# Patient Record
Sex: Male | Born: 1955 | Race: White | Hispanic: No | Marital: Married | Smoking: Never smoker
Health system: Southern US, Community
[De-identification: ages and names within clinical notes are randomized; demographics above are authoritative.]

## PROBLEM LIST (undated history)

## (undated) DIAGNOSIS — I82409 Acute embolism and thrombosis of unspecified deep veins of unspecified lower extremity: Secondary | ICD-10-CM

## (undated) DIAGNOSIS — K5903 Drug induced constipation: Secondary | ICD-10-CM

## (undated) DIAGNOSIS — R202 Paresthesia of skin: Secondary | ICD-10-CM

## (undated) DIAGNOSIS — N400 Enlarged prostate without lower urinary tract symptoms: Secondary | ICD-10-CM

## (undated) DIAGNOSIS — M549 Dorsalgia, unspecified: Secondary | ICD-10-CM

## (undated) DIAGNOSIS — S56429A Laceration of extensor muscle, fascia and tendon of unspecified finger at forearm level, initial encounter: Secondary | ICD-10-CM

## (undated) DIAGNOSIS — R300 Dysuria: Secondary | ICD-10-CM

## (undated) DIAGNOSIS — E039 Hypothyroidism, unspecified: Secondary | ICD-10-CM

## (undated) DIAGNOSIS — S61209A Unspecified open wound of unspecified finger without damage to nail, initial encounter: Secondary | ICD-10-CM

## (undated) DIAGNOSIS — D72829 Elevated white blood cell count, unspecified: Secondary | ICD-10-CM

## (undated) DIAGNOSIS — M4310 Spondylolisthesis, site unspecified: Secondary | ICD-10-CM

## (undated) DIAGNOSIS — N390 Urinary tract infection, site not specified: Secondary | ICD-10-CM

## (undated) DIAGNOSIS — D7589 Other specified diseases of blood and blood-forming organs: Secondary | ICD-10-CM

## (undated) DIAGNOSIS — R200 Anesthesia of skin: Secondary | ICD-10-CM

## (undated) DIAGNOSIS — I10 Essential (primary) hypertension: Secondary | ICD-10-CM

## (undated) DIAGNOSIS — M4300 Spondylolysis, site unspecified: Secondary | ICD-10-CM

## (undated) HISTORY — DX: Spondylolisthesis, site unspecified: M43.10

## (undated) HISTORY — PX: ULNAR NERVE REPAIR: SHX2594

## (undated) HISTORY — PX: HERNIA REPAIR: SHX51

## (undated) HISTORY — DX: Anesthesia of skin: R20.0

## (undated) HISTORY — PX: ESOPHAGOGASTRODUODENOSCOPY: SHX1529

## (undated) HISTORY — DX: Dorsalgia, unspecified: M54.9

## (undated) HISTORY — PX: THROMBECTOMY: SHX45

## (undated) HISTORY — DX: Anesthesia of skin: R20.2

## (undated) HISTORY — PX: UMBILICAL HERNIA REPAIR: SHX196

## (undated) HISTORY — PX: LIPOMA EXCISION: SHX5283

## (undated) HISTORY — DX: Spondylolysis, site unspecified: M43.00

---

## 2011-02-11 ENCOUNTER — Other Ambulatory Visit: Payer: Self-pay | Admitting: Internal Medicine

## 2011-02-12 LAB — PSA

## 2011-02-21 ENCOUNTER — Ambulatory Visit: Payer: Self-pay | Admitting: Internal Medicine

## 2011-03-26 ENCOUNTER — Other Ambulatory Visit: Payer: Self-pay | Admitting: Internal Medicine

## 2012-01-22 ENCOUNTER — Other Ambulatory Visit: Payer: Self-pay | Admitting: Internal Medicine

## 2012-01-22 LAB — T4, FREE: Free Thyroxine: 1.13 ng/dL (ref 0.76–1.46)

## 2012-01-22 LAB — TSH: Thyroid Stimulating Horm: 2.48 u[IU]/mL

## 2012-07-09 ENCOUNTER — Other Ambulatory Visit: Payer: Self-pay | Admitting: Internal Medicine

## 2012-07-09 LAB — CBC WITH DIFFERENTIAL/PLATELET
Basophil #: 0.1 10*3/uL (ref 0.0–0.1)
Basophil %: 1.4 %
Eosinophil #: 0.2 10*3/uL (ref 0.0–0.7)
Eosinophil %: 3 %
HCT: 44.1 % (ref 40.0–52.0)
HGB: 15.5 g/dL (ref 13.0–18.0)
Lymphocyte #: 1.9 10*3/uL (ref 1.0–3.6)
Lymphocyte %: 28.3 %
MCH: 34.1 pg — ABNORMAL HIGH (ref 26.0–34.0)
MCHC: 35.1 g/dL (ref 32.0–36.0)
MCV: 97 fL (ref 80–100)
Monocyte #: 0.4 x10 3/mm (ref 0.2–1.0)
Monocyte %: 6.2 %
Neutrophil #: 4.1 10*3/uL (ref 1.4–6.5)
Neutrophil %: 61.1 %
Platelet: 241 10*3/uL (ref 150–440)
RBC: 4.53 10*6/uL (ref 4.40–5.90)
RDW: 12.6 % (ref 11.5–14.5)
WBC: 6.8 10*3/uL (ref 3.8–10.6)

## 2012-07-09 LAB — URINALYSIS, COMPLETE
Bacteria: NONE SEEN
Bilirubin,UR: NEGATIVE
Blood: NEGATIVE
Glucose,UR: NEGATIVE mg/dL (ref 0–75)
Ketone: NEGATIVE
Leukocyte Esterase: NEGATIVE
Nitrite: NEGATIVE
Ph: 6 (ref 4.5–8.0)
Protein: NEGATIVE
RBC,UR: <1 /HPF (ref 0–5)
Specific Gravity: 1.015 (ref 1.003–1.030)
Squamous Epithelial: <1
WBC UR: 1 /HPF (ref 0–5)

## 2012-07-09 LAB — AMYLASE: Amylase: 27 U/L (ref 25–115)

## 2012-07-09 LAB — COMPREHENSIVE METABOLIC PANEL
Albumin: 4.1 g/dL (ref 3.4–5.0)
Alkaline Phosphatase: 77 U/L (ref 50–136)
Anion Gap: 9 (ref 7–16)
BUN: 15 mg/dL (ref 7–18)
Bilirubin,Total: 0.4 mg/dL (ref 0.2–1.0)
Calcium, Total: 8.7 mg/dL (ref 8.5–10.1)
Chloride: 108 mmol/L — ABNORMAL HIGH (ref 98–107)
Co2: 25 mmol/L (ref 21–32)
Creatinine: 0.92 mg/dL (ref 0.60–1.30)
EGFR (African American): >60
EGFR (Non-African Amer.): >60
Glucose: 94 mg/dL (ref 65–99)
Osmolality: 284 (ref 275–301)
Potassium: 3.7 mmol/L (ref 3.5–5.1)
SGOT(AST): 19 U/L (ref 15–37)
SGPT (ALT): 22 U/L (ref 12–78)
Sodium: 142 mmol/L (ref 136–145)
Total Protein: 7.4 g/dL (ref 6.4–8.2)

## 2012-07-09 LAB — LIPASE, BLOOD: Lipase: 164 U/L (ref 73–393)

## 2012-07-09 LAB — TROPONIN I: Troponin-I: <0.02 ng/mL

## 2014-10-06 DIAGNOSIS — E034 Atrophy of thyroid (acquired): Secondary | ICD-10-CM | POA: Insufficient documentation

## 2014-10-06 DIAGNOSIS — I10 Essential (primary) hypertension: Secondary | ICD-10-CM | POA: Insufficient documentation

## 2016-01-11 DIAGNOSIS — I10 Essential (primary) hypertension: Secondary | ICD-10-CM | POA: Diagnosis not present

## 2016-01-11 DIAGNOSIS — E034 Atrophy of thyroid (acquired): Secondary | ICD-10-CM | POA: Diagnosis not present

## 2016-01-11 DIAGNOSIS — D7589 Other specified diseases of blood and blood-forming organs: Secondary | ICD-10-CM | POA: Diagnosis not present

## 2016-01-11 DIAGNOSIS — Z0000 Encounter for general adult medical examination without abnormal findings: Secondary | ICD-10-CM | POA: Diagnosis not present

## 2016-01-11 DIAGNOSIS — Z125 Encounter for screening for malignant neoplasm of prostate: Secondary | ICD-10-CM | POA: Diagnosis not present

## 2016-04-12 DIAGNOSIS — Z125 Encounter for screening for malignant neoplasm of prostate: Secondary | ICD-10-CM | POA: Diagnosis not present

## 2016-04-12 DIAGNOSIS — D7589 Other specified diseases of blood and blood-forming organs: Secondary | ICD-10-CM | POA: Diagnosis not present

## 2016-04-12 DIAGNOSIS — I10 Essential (primary) hypertension: Secondary | ICD-10-CM | POA: Diagnosis not present

## 2016-04-18 DIAGNOSIS — K146 Glossodynia: Secondary | ICD-10-CM

## 2016-04-18 DIAGNOSIS — I10 Essential (primary) hypertension: Secondary | ICD-10-CM | POA: Diagnosis not present

## 2016-04-18 DIAGNOSIS — E034 Atrophy of thyroid (acquired): Secondary | ICD-10-CM | POA: Diagnosis not present

## 2016-04-18 HISTORY — DX: Glossodynia: K14.6

## 2016-09-06 DIAGNOSIS — H5202 Hypermetropia, left eye: Secondary | ICD-10-CM | POA: Diagnosis not present

## 2016-11-19 ENCOUNTER — Other Ambulatory Visit: Payer: Self-pay | Admitting: Surgery

## 2016-11-19 DIAGNOSIS — R6889 Other general symptoms and signs: Secondary | ICD-10-CM

## 2016-11-19 MED ORDER — OSELTAMIVIR PHOSPHATE 75 MG PO CAPS
75.0000 mg | ORAL_CAPSULE | Freq: Every day | ORAL | 0 refills | Status: DC
Start: 2016-11-19 — End: 2018-12-07

## 2017-04-30 DIAGNOSIS — Z125 Encounter for screening for malignant neoplasm of prostate: Secondary | ICD-10-CM | POA: Diagnosis not present

## 2017-04-30 DIAGNOSIS — I10 Essential (primary) hypertension: Secondary | ICD-10-CM | POA: Diagnosis not present

## 2017-04-30 DIAGNOSIS — Z7982 Long term (current) use of aspirin: Secondary | ICD-10-CM | POA: Diagnosis not present

## 2017-04-30 DIAGNOSIS — E034 Atrophy of thyroid (acquired): Secondary | ICD-10-CM | POA: Diagnosis not present

## 2017-05-09 DIAGNOSIS — I10 Essential (primary) hypertension: Secondary | ICD-10-CM | POA: Diagnosis not present

## 2017-05-09 DIAGNOSIS — N401 Enlarged prostate with lower urinary tract symptoms: Secondary | ICD-10-CM | POA: Insufficient documentation

## 2017-05-09 DIAGNOSIS — E034 Atrophy of thyroid (acquired): Secondary | ICD-10-CM | POA: Diagnosis not present

## 2017-05-09 DIAGNOSIS — Z0000 Encounter for general adult medical examination without abnormal findings: Secondary | ICD-10-CM | POA: Diagnosis not present

## 2017-05-09 DIAGNOSIS — R3911 Hesitancy of micturition: Secondary | ICD-10-CM | POA: Insufficient documentation

## 2017-05-09 DIAGNOSIS — K146 Glossodynia: Secondary | ICD-10-CM | POA: Diagnosis not present

## 2018-06-29 DIAGNOSIS — I10 Essential (primary) hypertension: Secondary | ICD-10-CM | POA: Diagnosis not present

## 2018-06-29 DIAGNOSIS — E034 Atrophy of thyroid (acquired): Secondary | ICD-10-CM | POA: Diagnosis not present

## 2018-06-29 DIAGNOSIS — K146 Glossodynia: Secondary | ICD-10-CM | POA: Diagnosis not present

## 2018-06-29 DIAGNOSIS — Z0000 Encounter for general adult medical examination without abnormal findings: Secondary | ICD-10-CM | POA: Diagnosis not present

## 2018-06-29 DIAGNOSIS — Z125 Encounter for screening for malignant neoplasm of prostate: Secondary | ICD-10-CM | POA: Diagnosis not present

## 2018-07-16 DIAGNOSIS — N401 Enlarged prostate with lower urinary tract symptoms: Secondary | ICD-10-CM | POA: Diagnosis not present

## 2018-07-16 DIAGNOSIS — E034 Atrophy of thyroid (acquired): Secondary | ICD-10-CM | POA: Diagnosis not present

## 2018-07-16 DIAGNOSIS — I10 Essential (primary) hypertension: Secondary | ICD-10-CM | POA: Diagnosis not present

## 2018-07-16 DIAGNOSIS — Z0000 Encounter for general adult medical examination without abnormal findings: Secondary | ICD-10-CM | POA: Diagnosis not present

## 2018-09-04 ENCOUNTER — Telehealth: Payer: Self-pay

## 2018-09-04 NOTE — Telephone Encounter (Signed)
Received a call from Dr. Allen Norris regarding patients colonoscopy referral from Sept.  Referral has not been received in our workque.  I was unable to contact patient to schedule him for his colonoscopy as Dr. Allen Norris requested.  No voicemail or answer service answered.  Thanks [employer]

## 2018-09-07 NOTE — Telephone Encounter (Signed)
Will you please try and contact him today again? Thanks!

## 2018-11-05 ENCOUNTER — Telehealth: Payer: Self-pay

## 2018-11-05 ENCOUNTER — Other Ambulatory Visit: Payer: Self-pay

## 2018-11-05 DIAGNOSIS — Z1211 Encounter for screening for malignant neoplasm of colon: Secondary | ICD-10-CM

## 2018-11-05 NOTE — Telephone Encounter (Signed)
Patient has been scheduled for screening colonoscopy a [place] with Dr. Allen Norris on 12/04/18.  Thanks [employer]

## 2018-11-18 ENCOUNTER — Telehealth: Payer: Self-pay

## 2018-11-18 ENCOUNTER — Telehealth: Payer: Self-pay | Admitting: Gastroenterology

## 2018-11-18 NOTE — Telephone Encounter (Signed)
Returned patients call.  He did not have his schedule and will call me back tomorrow.  Thanks [employer]

## 2018-11-18 NOTE — Telephone Encounter (Signed)
Contacted patient in regards to colonoscopy that was scheduled for 12/04/18 with Dr. Allen Norris at [facility].  Dr. Allen Norris will not be available due to a conference.  Patient states that he will call me back to reschedule.  Please get me to the phone when he calls back.  Thanks [employer]

## 2018-11-18 NOTE — Telephone Encounter (Signed)
Please call patient & schedule colonoscopy with Dr Allen Norris.

## 2018-11-20 ENCOUNTER — Other Ambulatory Visit: Payer: Self-pay

## 2018-11-20 DIAGNOSIS — Z1211 Encounter for screening for malignant neoplasm of colon: Secondary | ICD-10-CM

## 2018-12-04 ENCOUNTER — Ambulatory Visit: Admit: 2018-12-04 | Payer: 59 | Admitting: Gastroenterology

## 2018-12-04 SURGERY — COLONOSCOPY WITH PROPOFOL
Anesthesia: Choice

## 2018-12-07 ENCOUNTER — Other Ambulatory Visit: Payer: Self-pay

## 2018-12-07 ENCOUNTER — Encounter: Payer: Self-pay | Admitting: *Deleted

## 2018-12-15 NOTE — Discharge Instructions (Signed)
General Anesthesia, Adult, Care After  This sheet gives you information about how to care for yourself after your procedure. Your health care provider may also give you more specific instructions. If you have problems or questions, contact your health care provider.  What can I expect after the procedure?  After the procedure, the following side effects are common:  Pain or discomfort at the IV site.  Nausea.  Vomiting.  Sore throat.  Trouble concentrating.  Feeling cold or chills.  Weak or tired.  Sleepiness and fatigue.  Soreness and body aches. These side effects can affect parts of the body that were not involved in surgery.  Follow these instructions at home:    For at least 24 hours after the procedure:  Have a responsible adult stay with you. It is important to have someone help care for you until you are awake and alert.  Rest as needed.  Do not:  Participate in activities in which you could fall or become injured.  Drive.  Use heavy machinery.  Drink alcohol.  Take sleeping pills or medicines that cause drowsiness.  Make important decisions or sign legal documents.  Take care of children on your own.  Eating and drinking  Follow any instructions from your health care provider about eating or drinking restrictions.  When you feel hungry, start by eating small amounts of foods that are soft and easy to digest (bland), such as toast. Gradually return to your regular diet.  Drink enough fluid to keep your urine pale yellow.  If you vomit, rehydrate by drinking water, juice, or clear broth.  General instructions  If you have sleep apnea, surgery and certain medicines can increase your risk for breathing problems. Follow instructions from your health care provider about wearing your sleep device:  Anytime you are sleeping, including during daytime naps.  While taking prescription pain medicines, sleeping medicines, or medicines that make you drowsy.  Return to your normal activities as told by your health care  provider. Ask your health care provider what activities are safe for you.  Take over-the-counter and prescription medicines only as told by your health care provider.  If you smoke, do not smoke without supervision.  Keep all follow-up visits as told by your health care provider. This is important.  Contact a health care provider if:  You have nausea or vomiting that does not get better with medicine.  You cannot eat or drink without vomiting.  You have pain that does not get better with medicine.  You are unable to pass urine.  You develop a skin rash.  You have a fever.  You have redness around your IV site that gets worse.  Get help right away if:  You have difficulty breathing.  You have chest pain.  You have blood in your urine or stool, or you vomit blood.  Summary  After the procedure, it is common to have a sore throat or nausea. It is also common to feel tired.  Have a responsible adult stay with you for the first 24 hours after general anesthesia. It is important to have someone help care for you until you are awake and alert.  When you feel hungry, start by eating small amounts of foods that are soft and easy to digest (bland), such as toast. Gradually return to your regular diet.  Drink enough fluid to keep your urine pale yellow.  Return to your normal activities as told by your health care provider. Ask your health care   provider what activities are safe for you.  This information is not intended to replace advice given to you by your health care provider. Make sure you discuss any questions you have with your health care provider.  Document Released: 01/13/2001 Document Revised: 05/23/2017 Document Reviewed: 05/23/2017  Elsevier Interactive Patient Education  2019 [employer]

## 2018-12-18 ENCOUNTER — Ambulatory Visit: Payer: 59 | Admitting: Anesthesiology

## 2018-12-18 ENCOUNTER — Encounter
Admission: RE | Disposition: A | Discharge status: Home / Self Care | Payer: Self-pay | Source: Home / Self Care | Attending: Gastroenterology

## 2018-12-18 ENCOUNTER — Encounter: Payer: Self-pay | Admitting: Gastroenterology

## 2018-12-18 ENCOUNTER — Ambulatory Visit
Admission: RE | Admit: 2018-12-18 | Discharge: 2018-12-18 | Disposition: A | Discharge status: Home / Self Care | Payer: 59 | Attending: Gastroenterology | Admitting: Gastroenterology

## 2018-12-18 DIAGNOSIS — K5730 Diverticulosis of large intestine without perforation or abscess without bleeding: Secondary | ICD-10-CM | POA: Diagnosis not present

## 2018-12-18 DIAGNOSIS — Z1211 Encounter for screening for malignant neoplasm of colon: Secondary | ICD-10-CM | POA: Diagnosis not present

## 2018-12-18 DIAGNOSIS — K635 Polyp of colon: Secondary | ICD-10-CM | POA: Diagnosis not present

## 2018-12-18 DIAGNOSIS — I10 Essential (primary) hypertension: Secondary | ICD-10-CM | POA: Diagnosis not present

## 2018-12-18 DIAGNOSIS — E039 Hypothyroidism, unspecified: Secondary | ICD-10-CM | POA: Diagnosis not present

## 2018-12-18 DIAGNOSIS — Z79890 Hormone replacement therapy (postmenopausal): Secondary | ICD-10-CM | POA: Insufficient documentation

## 2018-12-18 DIAGNOSIS — Z79899 Other long term (current) drug therapy: Secondary | ICD-10-CM | POA: Diagnosis not present

## 2018-12-18 DIAGNOSIS — K648 Other hemorrhoids: Secondary | ICD-10-CM | POA: Insufficient documentation

## 2018-12-18 DIAGNOSIS — D122 Benign neoplasm of ascending colon: Secondary | ICD-10-CM | POA: Diagnosis not present

## 2018-12-18 HISTORY — PX: COLONOSCOPY WITH PROPOFOL: SHX5780

## 2018-12-18 HISTORY — DX: Benign prostatic hyperplasia without lower urinary tract symptoms: N40.0

## 2018-12-18 HISTORY — DX: Essential (primary) hypertension: I10

## 2018-12-18 HISTORY — DX: Hypothyroidism, unspecified: E03.9

## 2018-12-18 HISTORY — PX: POLYPECTOMY: SHX5525

## 2018-12-18 HISTORY — DX: Other specified diseases of blood and blood-forming organs: D75.89

## 2018-12-18 SURGERY — COLONOSCOPY WITH PROPOFOL
Anesthesia: General | Site: Rectum

## 2018-12-18 MED ORDER — FENTANYL CITRATE (PF) 100 MCG/2ML IJ SOLN: 25.0000 ug | INTRAMUSCULAR | Status: DC | PRN

## 2018-12-18 MED ORDER — OXYCODONE HCL 5 MG PO TABS: 5.0000 mg | ORAL_TABLET | Freq: Once | ORAL | Status: DC | PRN

## 2018-12-18 MED ORDER — STERILE WATER FOR IRRIGATION IR SOLN
Status: DC | PRN
  Administered 2018-12-18: 08:00:00

## 2018-12-18 MED ORDER — LACTATED RINGERS IV SOLN
INTRAVENOUS | Status: DC
  Administered 2018-12-18: 07:00:00 via INTRAVENOUS

## 2018-12-18 MED ORDER — PROPOFOL 10 MG/ML IV BOLUS
INTRAVENOUS | Status: DC | PRN
  Administered 2018-12-18 (×3): 50 mg via INTRAVENOUS
  Administered 2018-12-18: 20 mg via INTRAVENOUS
  Administered 2018-12-18: 50 mg via INTRAVENOUS
  Administered 2018-12-18: 20 mg via INTRAVENOUS
  Administered 2018-12-18: 50 mg via INTRAVENOUS
  Administered 2018-12-18: 10 mg via INTRAVENOUS
  Administered 2018-12-18: 100 mg via INTRAVENOUS
  Administered 2018-12-18: 50 mg via INTRAVENOUS
  Administered 2018-12-18: 20 mg via INTRAVENOUS

## 2018-12-18 MED ORDER — SODIUM CHLORIDE 0.9 % IV SOLN: INTRAVENOUS | Status: DC

## 2018-12-18 MED ORDER — LIDOCAINE HCL (CARDIAC) PF 100 MG/5ML IV SOSY
PREFILLED_SYRINGE | INTRAVENOUS | Status: DC | PRN
  Administered 2018-12-18: 440 mg via INTRAVENOUS

## 2018-12-18 MED ORDER — PROMETHAZINE HCL 25 MG/ML IJ SOLN: 6.2500 mg | INTRAMUSCULAR | Status: DC | PRN

## 2018-12-18 MED ORDER — OXYCODONE HCL 5 MG/5ML PO SOLN: 5.0000 mg | Freq: Once | ORAL | Status: DC | PRN

## 2018-12-18 SURGICAL SUPPLY — 6 items
CANISTER SUCT 1200ML W/VALVE (MISCELLANEOUS) ×2 IMPLANT
FORCEPS BIOP RAD 4 LRG CAP 4 (CUTTING FORCEPS) ×2 IMPLANT
GOWN CVR UNV OPN BCK APRN NK (MISCELLANEOUS) ×2 IMPLANT
GOWN ISOL THUMB LOOP REG UNIV (MISCELLANEOUS) ×2
KIT ENDO PROCEDURE OLY (KITS) ×2 IMPLANT
WATER STERILE IRR 250ML POUR (IV SOLUTION) ×2 IMPLANT

## 2018-12-18 NOTE — H&P (Signed)
Alexander Lame, MD [address] [place], [place] [address] Phone: [phone] Fax : [phone]  Primary Care Physician:  Adin Hector, MD Primary Gastroenterologist:  Dr. Allen Norris  Pre-Procedure History & Physical: HPI:  Alexander Rojas is a 63 y.o. male is here for a screening colonoscopy.   Past Medical History:  Diagnosis Date  . BPH (benign prostatic hyperplasia)   . Hypertension   . Hypothyroidism   . Macrocytosis     Past Surgical History:  Procedure Laterality Date  . LIPOMA EXCISION Right    upper arm  . UMBILICAL HERNIA REPAIR      Prior to Admission medications   Medication Sig Start Date End Date Taking? Authorizing Provider  hydrochlorothiazide (HYDRODIURIL) 12.5 MG tablet Take 12.5 mg by mouth daily.   Yes [provider]  levothyroxine (SYNTHROID, LEVOTHROID) 100 MCG tablet Take 100 mcg by mouth daily before breakfast.   Yes [provider]  losartan (COZAAR) 50 MG tablet Take 50 mg by mouth daily.   Yes [provider]    Allergies as of 11/20/2018  . (Not on File)    History reviewed. No pertinent family history.  Social History   Socioeconomic History  . Marital status: Married    Spouse name: Not on file  . Number of children: Not on file  . Years of education: Not on file  . Highest education level: Not on file  Occupational History  . Not on file  Social Needs  . Financial resource strain: Not on file  . Food insecurity:    Worry: Not on file    Inability: Not on file  . Transportation needs:    Medical: Not on file    Non-medical: Not on file  Tobacco Use  . Smoking status: Never Smoker  . Smokeless tobacco: Never Used  Substance and Sexual Activity  . Alcohol use: Not Currently  . Drug use: Not on file  . Sexual activity: Not on file  Lifestyle  . Physical activity:    Days per week: Not on file    Minutes per session: Not on file  . Stress: Not on file  Relationships  . Social  connections:    Talks on phone: Not on file    Gets together: Not on file    Attends religious service: Not on file    Active member of club or organization: Not on file    Attends meetings of clubs or organizations: Not on file    Relationship status: Not on file  . Intimate partner violence:    Fear of current or ex partner: Not on file    Emotionally abused: Not on file    Physically abused: Not on file    Forced sexual activity: Not on file  Other Topics Concern  . Not on file  Social History Narrative  . Not on file    Review of Systems: See HPI, otherwise negative ROS  Physical Exam: BP 124/80   Pulse (!) 58   Temp (!) 97.3 F (36.3 C) (Temporal)   Ht 5\' 9"  (1.753 m)   Wt 85.7 kg   SpO2 96%   BMI 27.91 kg/m  General:   Alert,  pleasant and cooperative in NAD Head:  Normocephalic and atraumatic. Neck:  Supple; no masses or thyromegaly. Lungs:  Clear throughout to auscultation.    Heart:  Regular rate and rhythm. Abdomen:  Soft, nontender and nondistended. Normal bowel sounds, without guarding, and without  rebound.   Neurologic:  Alert and  oriented x4;  grossly normal neurologically.  Impression/Plan: Alexander Rojas is now here to undergo a screening colonoscopy.  Risks, benefits, and alternatives regarding colonoscopy have been reviewed with the patient.  Questions have been answered.  All parties agreeable.

## 2018-12-18 NOTE — Anesthesia Preprocedure Evaluation (Signed)
Anesthesia Evaluation  Patient identified by MRN, date of birth, ID band Patient awake    Reviewed: Allergy & Precautions, NPO status , Patient's Chart, lab work & pertinent test results  Airway Mallampati: II  TM Distance: >3 FB Neck ROM: Full    Dental no notable dental hx.    Pulmonary neg pulmonary ROS,    Pulmonary exam normal breath sounds clear to auscultation       Cardiovascular hypertension, Normal cardiovascular exam Rhythm:Regular Rate:Normal     Neuro/Psych negative neurological ROS  negative psych ROS   GI/Hepatic negative GI ROS, Neg liver ROS,   Endo/Other  Hypothyroidism   Renal/GU negative Renal ROS  negative genitourinary   Musculoskeletal negative musculoskeletal ROS (+)   Abdominal   Peds negative pediatric ROS (+)  Hematology negative hematology ROS (+)   Anesthesia Other Findings   Reproductive/Obstetrics negative OB ROS                             Anesthesia Physical Anesthesia Plan  ASA: II  Anesthesia Plan: General   Post-op Pain Management:    Induction: Intravenous  PONV Risk Score and Plan:   Airway Management Planned: Natural Airway  Additional Equipment:   Intra-op Plan:   Post-operative Plan: Extubation in OR  Informed Consent: I have reviewed the patients History and Physical, chart, labs and discussed the procedure including the risks, benefits and alternatives for the proposed anesthesia with the patient or authorized representative who has indicated his/her understanding and acceptance.     Dental advisory given  Plan Discussed with: CRNA  Anesthesia Plan Comments:         Anesthesia Quick Evaluation

## 2018-12-18 NOTE — Transfer of Care (Signed)
Immediate Anesthesia Transfer of Care Note  Patient: Alexander Rojas  Procedure(s) Performed: COLONOSCOPY WITH BIOPSY (N/A Rectum) POLYPECTOMY (N/A Rectum)  Patient Location: PACU  Anesthesia Type: General  Level of Consciousness: awake, alert  and patient cooperative  Airway and Oxygen Therapy: Patient Spontanous Breathing and Patient connected to supplemental oxygen  Post-op Assessment: Post-op Vital signs reviewed, Patient's Cardiovascular Status Stable, Respiratory Function Stable, Patent Airway and No signs of Nausea or vomiting  Post-op Vital Signs: Reviewed and stable  Complications: No apparent anesthesia complications

## 2018-12-18 NOTE — Op Note (Signed)
[facility] Gastroenterology Patient Name: Alexander Rojas Procedure Date: 12/18/2018 7:12 AM MRN: [id number] Account #: [id number] Date of Birth: [birth year] Admit Type: Outpatient Age: 63 Room: [facility] OR ROOM 01 Gender: Male Note Status: Finalized Procedure:            Colonoscopy Indications:          Screening for colorectal malignant neoplasm Providers:            Lucilla Lame MD, MD Referring MD:         Ramonita Lab, MD (Referring MD) Medicines:            Propofol per Anesthesia Complications:        No immediate complications. Procedure:            Pre-Anesthesia Assessment:                       - Prior to the procedure, a History and Physical was                        performed, and patient medications and allergies were                        reviewed. The patient's tolerance of previous                        anesthesia was also reviewed. The risks and benefits of                        the procedure and the sedation options and risks were                        discussed with the patient. All questions were                        answered, and informed consent was obtained. Prior                        Anticoagulants: The patient has taken no previous                        anticoagulant or antiplatelet agents. ASA Grade                        Assessment: II - A patient with mild systemic disease.                        After reviewing the risks and benefits, the patient was                        deemed in satisfactory condition to undergo the                        procedure.                       After obtaining informed consent, the colonoscope was                        passed under direct vision. Throughout the procedure,  the patient's blood pressure, pulse, and oxygen                        saturations were monitored continuously. The                        Colonoscope was introduced through the anus and   advanced to the the terminal ileum. The colonoscopy was                        performed without difficulty. The patient tolerated the                        procedure well. The quality of the bowel preparation                        was good. Findings:      The perianal and digital rectal examinations were normal.      A 2 mm polyp was found in the ascending colon. The polyp was sessile.       The polyp was removed with a cold biopsy forceps. Resection and       retrieval were complete.      A few small-mouthed diverticula were found in the sigmoid colon.      Non-bleeding internal hemorrhoids were found during retroflexion. The       hemorrhoids were Grade I (internal hemorrhoids that do not prolapse). Impression:           - One 2 mm polyp in the ascending colon, removed with a                        cold biopsy forceps. Resected and retrieved.                       - Diverticulosis in the sigmoid colon.                       - Non-bleeding internal hemorrhoids. Recommendation:       - Discharge patient to home.                       - Resume previous diet.                       - Continue present medications.                       - Await pathology results.                       - Repeat colonoscopy in 5 years if polyp adenoma and 10                        years if hyperplastic Procedure Code(s):    --- Professional ---                       [phone], Colonoscopy, flexible; with biopsy, single or                        multiple Diagnosis Code(s):    --- Professional ---  Z12.11, Encounter for screening for malignant neoplasm                        of colon                       D12.2, Benign neoplasm of ascending colon CPT copyright 2018 American Medical Association. All rights reserved. The codes documented in this report are preliminary and upon coder review may  be revised to meet current compliance requirements. Lucilla Lame MD, MD 12/18/2018 8:00:06 AM This  report has been signed electronically. Number of Addenda: 0 Note Initiated On: 12/18/2018 7:12 AM Scope Withdrawal Time: 0 hours 11 minutes 11 seconds  Total Procedure Duration: 0 hours 14 minutes 57 seconds       [facility]

## 2018-12-18 NOTE — Anesthesia Postprocedure Evaluation (Signed)
Anesthesia Post Note  Patient: Alexander Rojas  Procedure(s) Performed: COLONOSCOPY WITH BIOPSY (N/A Rectum) POLYPECTOMY (N/A Rectum)  Patient location during evaluation: PACU Anesthesia Type: General Level of consciousness: awake and alert Pain management: pain level controlled Vital Signs Assessment: post-procedure vital signs reviewed and stable Respiratory status: spontaneous breathing, nonlabored ventilation, respiratory function stable and patient connected to nasal cannula oxygen Cardiovascular status: blood pressure returned to baseline and stable Postop Assessment: no apparent nausea or vomiting Anesthetic complications: no    Seaborn Nakama C

## 2018-12-22 ENCOUNTER — Encounter: Payer: Self-pay | Admitting: Gastroenterology

## 2018-12-22 LAB — SURGICAL PATHOLOGY

## 2019-02-11 DIAGNOSIS — R1013 Epigastric pain: Secondary | ICD-10-CM | POA: Diagnosis not present

## 2019-02-19 ENCOUNTER — Other Ambulatory Visit: Payer: Self-pay

## 2019-02-19 ENCOUNTER — Other Ambulatory Visit: Payer: Self-pay | Admitting: Internal Medicine

## 2019-02-19 DIAGNOSIS — R112 Nausea with vomiting, unspecified: Secondary | ICD-10-CM

## 2019-02-19 DIAGNOSIS — R1013 Epigastric pain: Secondary | ICD-10-CM

## 2019-02-23 ENCOUNTER — Ambulatory Visit
Admission: RE | Admit: 2019-02-23 | Discharge: 2019-02-23 | Disposition: A | Discharge status: Home / Self Care | Payer: 59 | Attending: Gastroenterology | Admitting: Gastroenterology

## 2019-02-23 ENCOUNTER — Ambulatory Visit: Payer: 59 | Admitting: Certified Registered Nurse Anesthetist

## 2019-02-23 ENCOUNTER — Encounter
Admission: RE | Disposition: A | Discharge status: Home / Self Care | Payer: Self-pay | Source: Home / Self Care | Attending: Gastroenterology

## 2019-02-23 ENCOUNTER — Other Ambulatory Visit: Payer: Self-pay

## 2019-02-23 DIAGNOSIS — K2960 Other gastritis without bleeding: Secondary | ICD-10-CM | POA: Diagnosis not present

## 2019-02-23 DIAGNOSIS — N400 Enlarged prostate without lower urinary tract symptoms: Secondary | ICD-10-CM | POA: Diagnosis not present

## 2019-02-23 DIAGNOSIS — K2970 Gastritis, unspecified, without bleeding: Secondary | ICD-10-CM | POA: Diagnosis not present

## 2019-02-23 DIAGNOSIS — Z79890 Hormone replacement therapy (postmenopausal): Secondary | ICD-10-CM | POA: Insufficient documentation

## 2019-02-23 DIAGNOSIS — R112 Nausea with vomiting, unspecified: Secondary | ICD-10-CM | POA: Diagnosis not present

## 2019-02-23 DIAGNOSIS — Z79899 Other long term (current) drug therapy: Secondary | ICD-10-CM | POA: Diagnosis not present

## 2019-02-23 DIAGNOSIS — I10 Essential (primary) hypertension: Secondary | ICD-10-CM | POA: Diagnosis not present

## 2019-02-23 DIAGNOSIS — E039 Hypothyroidism, unspecified: Secondary | ICD-10-CM | POA: Insufficient documentation

## 2019-02-23 DIAGNOSIS — T189XXA Foreign body of alimentary tract, part unspecified, initial encounter: Secondary | ICD-10-CM | POA: Diagnosis not present

## 2019-02-23 DIAGNOSIS — K449 Diaphragmatic hernia without obstruction or gangrene: Secondary | ICD-10-CM | POA: Insufficient documentation

## 2019-02-23 HISTORY — PX: ESOPHAGOGASTRODUODENOSCOPY (EGD) WITH PROPOFOL: SHX5813

## 2019-02-23 SURGERY — ESOPHAGOGASTRODUODENOSCOPY (EGD) WITH PROPOFOL
Anesthesia: General

## 2019-02-23 MED ORDER — PROPOFOL 500 MG/50ML IV EMUL
INTRAVENOUS | Status: DC | PRN
  Administered 2019-02-23: 120 ug/kg/min via INTRAVENOUS

## 2019-02-23 MED ORDER — LIDOCAINE 2% (20 MG/ML) 5 ML SYRINGE
INTRAMUSCULAR | Status: DC | PRN
  Administered 2019-02-23: 25 mg via INTRAVENOUS

## 2019-02-23 MED ORDER — PROPOFOL 10 MG/ML IV BOLUS
INTRAVENOUS | Status: DC | PRN
  Administered 2019-02-23: 30 mg via INTRAVENOUS
  Administered 2019-02-23: 70 mg via INTRAVENOUS

## 2019-02-23 MED ORDER — LIDOCAINE HCL (PF) 2 % IJ SOLN
INTRAMUSCULAR | Status: AC
  Filled 2019-02-23: qty 10

## 2019-02-23 MED ORDER — SODIUM CHLORIDE 0.9 % IV SOLN
INTRAVENOUS | Status: DC
  Administered 2019-02-23: 20 mL via INTRAVENOUS

## 2019-02-23 MED ORDER — PROPOFOL 10 MG/ML IV BOLUS
INTRAVENOUS | Status: AC
  Filled 2019-02-23: qty 40

## 2019-02-23 NOTE — Anesthesia Post-op Follow-up Note (Signed)
Anesthesia QCDR form completed.        

## 2019-02-23 NOTE — Op Note (Signed)
[facility] Gastroenterology Patient Name: Alexander Rojas Procedure Date: 02/23/2019 7:25 AM MRN: [id number] Account #: [id number] Date of Birth: Nov 09, 1955 Admit Type: Outpatient Age: 63 Room: [facility] ENDO ROOM 4 Gender: Male Note Status: Finalized Procedure:            Upper GI endoscopy Indications:          Nausea Providers:            Lucilla Lame MD, MD Referring MD:         No Local Md, MD (Referring MD) Medicines:            Propofol per Anesthesia Complications:        No immediate complications. Procedure:            Pre-Anesthesia Assessment:                       - Prior to the procedure, a History and Physical was                        performed, and patient medications and allergies were                        reviewed. The patient's tolerance of previous                        anesthesia was also reviewed. The risks and benefits of                        the procedure and the sedation options and risks were                        discussed with the patient. All questions were                        answered, and informed consent was obtained. Prior                        Anticoagulants: The patient has taken no previous                        anticoagulant or antiplatelet agents. ASA Grade                        Assessment: II - A patient with mild systemic disease.                        After reviewing the risks and benefits, the patient was                        deemed in satisfactory condition to undergo the                        procedure.                       After obtaining informed consent, the endoscope was                        passed under direct vision. Throughout the procedure,  the patient's blood pressure, pulse, and oxygen                        saturations were monitored continuously. The Endoscope                        was introduced through the mouth, and advanced to the                        second part of  duodenum. The upper GI endoscopy was                        accomplished without difficulty. The patient tolerated                        the procedure well. Findings:      A small hiatal hernia was present.      A large amount of food (residue) was found in the entire examined       stomach.      The examined duodenum was normal.      Biopsies were taken with a cold forceps in the middle third of the       esophagus and in the gastric antrum for histology. Impression:           - Small hiatal hernia.                       - A large amount of food (residue) in the stomach.                       - Normal examined duodenum.                       - Biopsies were taken with a cold forceps for histology                        in the middle third of the esophagus and in the gastric                        antrum. Recommendation:       - Discharge patient to home.                       - Resume previous diet.                       - Continue present medications.                       - Await pathology results. Procedure Code(s):    --- Professional ---                       [phone], Esophagogastroduodenoscopy, flexible, transoral;                        with biopsy, single or multiple Diagnosis Code(s):    --- Professional ---                       R11.0, Nausea  K44.9, Diaphragmatic hernia without obstruction or                        gangrene CPT copyright 2019 American Medical Association. All rights reserved. The codes documented in this report are preliminary and upon coder review may  be revised to meet current compliance requirements. Lucilla Lame MD, MD 02/23/2019 7:44:51 AM This report has been signed electronically. Number of Addenda: 0 Note Initiated On: 02/23/2019 7:25 AM      [facility]

## 2019-02-23 NOTE — Anesthesia Postprocedure Evaluation (Signed)
Anesthesia Post Note  Patient: Alexander Rojas  Procedure(s) Performed: ESOPHAGOGASTRODUODENOSCOPY (EGD) WITH PROPOFOL (N/A )  Patient location during evaluation: Endoscopy Anesthesia Type: General Level of consciousness: awake and alert and oriented Pain management: pain level controlled Vital Signs Assessment: post-procedure vital signs reviewed and stable Respiratory status: spontaneous breathing, nonlabored ventilation and respiratory function stable Cardiovascular status: blood pressure returned to baseline and stable Postop Assessment: no signs of nausea or vomiting Anesthetic complications: no     Last Vitals:  Vitals:   02/23/19 0655 02/23/19 0749  BP: 128/87 100/74  Pulse: 80 60  Resp: 18 18  Temp: (!) 36.3 C (!) 36.1 C  SpO2: 99% 97%    Last Pain:  Vitals:   02/23/19 0808  TempSrc:   PainSc: 0-No pain                 Evren Shankland

## 2019-02-23 NOTE — Transfer of Care (Signed)
Immediate Anesthesia Transfer of Care Note  Patient: Alexander Rojas  Procedure(s) Performed: ESOPHAGOGASTRODUODENOSCOPY (EGD) WITH PROPOFOL (N/A )  Patient Location: Endoscopy Unit  Anesthesia Type:General  Level of Consciousness: awake, alert  and oriented  Airway & Oxygen Therapy: Patient Spontanous Breathing and Patient connected to nasal cannula oxygen  Post-op Assessment: Report given to RN and Post -op Vital signs reviewed and stable  Post vital signs: Reviewed  Last Vitals:  Vitals Value Taken Time  BP 100/74 02/23/2019  7:49 AM  Temp 36.1 C 02/23/2019  7:49 AM  Pulse 60 02/23/2019  7:49 AM  Resp 24 02/23/2019  7:49 AM  SpO2 97 % 02/23/2019  7:49 AM  Vitals shown include unvalidated device data.  Last Pain:  Vitals:   02/23/19 0655  TempSrc: Tympanic         Complications: No apparent anesthesia complications

## 2019-02-23 NOTE — H&P (Signed)
Lucilla Lame, MD [address] [place], [place] [address] Phone:[phone] Fax : [phone]  Primary Care Physician:  Adin Hector, MD Primary Gastroenterologist:  Dr. Allen Norris  Pre-Procedure History & Physical: HPI:  Alexander Rojas is a 63 y.o. male is here for an endoscopy.   Past Medical History:  Diagnosis Date  . BPH (benign prostatic hyperplasia)   . Hypertension   . Hypothyroidism   . Macrocytosis     Past Surgical History:  Procedure Laterality Date  . COLONOSCOPY WITH PROPOFOL N/A 12/18/2018   Procedure: COLONOSCOPY WITH BIOPSY;  Surgeon: Lucilla Lame, MD;  Location: [facility];  Service: Endoscopy;  Laterality: N/A;  NEEDS TO STAY FIRST  . ESOPHAGOGASTRODUODENOSCOPY    . LIPOMA EXCISION Right    upper arm  . POLYPECTOMY N/A 12/18/2018   Procedure: POLYPECTOMY;  Surgeon: Lucilla Lame, MD;  Location: [facility];  Service: Endoscopy;  Laterality: N/A;  . UMBILICAL HERNIA REPAIR      Prior to Admission medications   Medication Sig Start Date End Date Taking? Authorizing Provider  hydrochlorothiazide (HYDRODIURIL) 12.5 MG tablet Take 12.5 mg by mouth daily.   Yes [provider]  levothyroxine (SYNTHROID, LEVOTHROID) 100 MCG tablet Take 100 mcg by mouth daily before breakfast.   Yes [provider]  losartan (COZAAR) 50 MG tablet Take 50 mg by mouth daily.   Yes [provider]    Allergies as of 02/19/2019  . (No Known Allergies)    History reviewed. No pertinent family history.  Social History   Socioeconomic History  . Marital status: Married    Spouse name: Not on file  . Number of children: Not on file  . Years of education: Not on file  . Highest education level: Not on file  Occupational History  . Not on file  Social Needs  . Financial resource strain: Not on file  . Food insecurity:    Worry: Not on file    Inability: Not on file  . Transportation needs:    Medical: Not on file     Non-medical: Not on file  Tobacco Use  . Smoking status: Never Smoker  . Smokeless tobacco: Never Used  Substance and Sexual Activity  . Alcohol use: Not Currently  . Drug use: Not on file  . Sexual activity: Not on file  Lifestyle  . Physical activity:    Days per week: Not on file    Minutes per session: Not on file  . Stress: Not on file  Relationships  . Social connections:    Talks on phone: Not on file    Gets together: Not on file    Attends religious service: Not on file    Active member of club or organization: Not on file    Attends meetings of clubs or organizations: Not on file    Relationship status: Not on file  . Intimate partner violence:    Fear of current or ex partner: Not on file    Emotionally abused: Not on file    Physically abused: Not on file    Forced sexual activity: Not on file  Other Topics Concern  . Not on file  Social History Narrative  . Not on file    Review of Systems: See HPI, otherwise negative ROS  Physical Exam: There were no vitals taken for this visit. General:   Alert,  pleasant and cooperative in NAD Head:  Normocephalic and atraumatic. Neck:  Supple;  no masses or thyromegaly. Lungs:  Clear throughout to auscultation.    Heart:  Regular rate and rhythm. Abdomen:  Soft, nontender and nondistended. Normal bowel sounds, without guarding, and without rebound.   Neurologic:  Alert and  oriented x4;  grossly normal neurologically.  Impression/Plan: Alexander Rojas is here for an endoscopy to be performed for Nausea  Risks, benefits, limitations, and alternatives regarding  endoscopy have been reviewed with the patient.  Questions have been answered.  All parties agreeable.   Lucilla Lame, MD  02/23/2019, 7:10 AM

## 2019-02-23 NOTE — Anesthesia Preprocedure Evaluation (Signed)
Anesthesia Evaluation  Patient identified by MRN, date of birth, ID band Patient awake    Reviewed: Allergy & Precautions, NPO status , Patient's Chart, lab work & pertinent test results  History of Anesthesia Complications Negative for: history of anesthetic complications  Airway Mallampati: II  TM Distance: >3 FB Neck ROM: Full    Dental no notable dental hx.    Pulmonary neg pulmonary ROS, neg sleep apnea, neg COPD,    breath sounds clear to auscultation- rhonchi (-) wheezing      Cardiovascular Exercise Tolerance: Good hypertension, Pt. on medications (-) CAD, (-) Past MI, (-) Cardiac Stents and (-) CABG  Rhythm:Regular Rate:Normal - Systolic murmurs and - Diastolic murmurs    Neuro/Psych neg Seizures negative neurological ROS  negative psych ROS   GI/Hepatic negative GI ROS, Neg liver ROS,   Endo/Other  neg diabetesHypothyroidism   Renal/GU negative Renal ROS     Musculoskeletal negative musculoskeletal ROS (+)   Abdominal (+) - obese,   Peds  Hematology negative hematology ROS (+)   Anesthesia Other Findings Past Medical History: No date: BPH (benign prostatic hyperplasia) No date: Hypertension No date: Hypothyroidism No date: Macrocytosis   Reproductive/Obstetrics                             Anesthesia Physical Anesthesia Plan  ASA: II  Anesthesia Plan: General   Post-op Pain Management:    Induction: Intravenous  PONV Risk Score and Plan: 1 and Propofol infusion  Airway Management Planned: Natural Airway  Additional Equipment:   Intra-op Plan:   Post-operative Plan:   Informed Consent: I have reviewed the patients History and Physical, chart, labs and discussed the procedure including the risks, benefits and alternatives for the proposed anesthesia with the patient or authorized representative who has indicated his/her understanding and acceptance.     Dental  advisory given  Plan Discussed with: CRNA and Anesthesiologist  Anesthesia Plan Comments:         Anesthesia Quick Evaluation

## 2019-02-25 LAB — SURGICAL PATHOLOGY

## 2019-02-26 ENCOUNTER — Other Ambulatory Visit: Payer: Self-pay | Admitting: Gastroenterology

## 2019-02-26 ENCOUNTER — Ambulatory Visit: Payer: 59

## 2019-02-26 MED ORDER — ONDANSETRON HCL 4 MG PO TABS: 4.0000 mg | ORAL_TABLET | Freq: Three times a day (TID) | ORAL | 3 refills | Status: DC | PRN

## 2019-05-19 ENCOUNTER — Other Ambulatory Visit: Payer: Self-pay

## 2019-05-19 ENCOUNTER — Other Ambulatory Visit: Payer: Self-pay | Admitting: Internal Medicine

## 2019-05-19 DIAGNOSIS — Z20822 Contact with and (suspected) exposure to covid-19: Secondary | ICD-10-CM

## 2019-11-02 ENCOUNTER — Other Ambulatory Visit (INDEPENDENT_AMBULATORY_CARE_PROVIDER_SITE_OTHER): Payer: 59 | Admitting: Plastic Surgery

## 2019-11-02 DIAGNOSIS — S66822A Laceration of other specified muscles, fascia and tendons at wrist and hand level, left hand, initial encounter: Secondary | ICD-10-CM | POA: Diagnosis not present

## 2019-11-02 DIAGNOSIS — S61402A Unspecified open wound of left hand, initial encounter: Secondary | ICD-10-CM

## 2019-11-02 NOTE — Progress Notes (Signed)
Referring Provider No referring provider defined for this encounter.   CC: No chief complaint on file. Left thumb laceration  Alexander Rojas is an 64 y.o. male.  HPI: Patient presents after injuring his left thumb washing dishes.  This happened a couple hours ago.  He was washing dishes and a glass cake dish broke lacerating the dorsal aspect of his left thumb at the MP joint level.  He noted the inability to extend his thumb and sought medical attention.  He has no other injuries.  He has no other history of trauma to that area.  No Known Allergies  Outpatient Encounter Medications as of 11/02/2019  Medication Sig  . hydrochlorothiazide (HYDRODIURIL) 12.5 MG tablet Take 12.5 mg by mouth daily.  Marland Kitchen levothyroxine (SYNTHROID, LEVOTHROID) 100 MCG tablet Take 100 mcg by mouth daily before breakfast.  . losartan (COZAAR) 50 MG tablet Take 50 mg by mouth daily.  . ondansetron (ZOFRAN) 4 MG tablet Take 1 tablet (4 mg total) by mouth every 8 (eight) hours as needed for nausea or vomiting.   No facility-administered encounter medications on file as of 11/02/2019.     Past Medical History:  Diagnosis Date  . BPH (benign prostatic hyperplasia)   . Hypertension   . Hypothyroidism   . Macrocytosis     Past Surgical History:  Procedure Laterality Date  . COLONOSCOPY WITH PROPOFOL N/A 12/18/2018   Procedure: COLONOSCOPY WITH BIOPSY;  Surgeon: Lucilla Lame, MD;  Location: [facility];  Service: Endoscopy;  Laterality: N/A;  NEEDS TO STAY FIRST  . ESOPHAGOGASTRODUODENOSCOPY    . ESOPHAGOGASTRODUODENOSCOPY (EGD) WITH PROPOFOL N/A 02/23/2019   Procedure: ESOPHAGOGASTRODUODENOSCOPY (EGD) WITH PROPOFOL;  Surgeon: Lucilla Lame, MD;  Location: [facility] ENDOSCOPY;  Service: Endoscopy;  Laterality: N/A;  . LIPOMA EXCISION Right    upper arm  . POLYPECTOMY N/A 12/18/2018   Procedure: POLYPECTOMY;  Surgeon: Lucilla Lame, MD;  Location: [facility];  Service: Endoscopy;  Laterality: N/A;  .  UMBILICAL HERNIA REPAIR      No family history on file.  Social History   Social History Narrative  . Not on file     Review of Systems General: Denies fevers, chills, weight loss CV: Denies chest pain, shortness of breath, palpitations  Physical Exam Vitals with BMI 02/23/2019 02/23/2019 12/18/2018  Height - 5\' 10"  -  Weight - 190 lbs -  BMI - 0000000 -  Systolic 123XX123 0000000 -  Diastolic 74 87 -  Pulse 60 80 55    General:  No acute distress,  Alert and oriented, Non-Toxic, Normal speech and affect Left hand: Fingers are well perfused with normal capillary refill.  Sensation is intact throughout.  He has good range of motion of his fingers but is unable to extend his thumb.  He has good flexion of the FPL.  With his hand flat on the table he is unable to extend his thumb posterior to the plane of the hand.  There is a 2 cm laceration at the MP joint level with what looks like exposed and lacerated joint capsule.  Assessment/Plan Patient presents with a laceration to his left thumb.  It looks like his thumb extensors are completely cut.  I anesthetized the area with lidocaine with epinephrine irrigated copiously with soap and water and closed the laceration with interrupted 4-0 Prolene sutures.  We discussed the need for tendon repair.  We will plan to do this later this week in the surgery center.  I explained the postoperative  recovery which should include about 4 weeks of immobilization.  We will also get an x-ray tomorrow to make sure there is no fracture or obvious foreign body.  Cindra Presume 11/02/2019, 9:16 PM

## 2019-11-02 NOTE — H&P (View-Only) (Signed)
Referring Provider No referring provider defined for this encounter.   CC: No chief complaint on file. Left thumb laceration  Alexander Rojas is an 64 y.o. male.  HPI: Patient presents after injuring his left thumb washing dishes.  This happened a couple hours ago.  He was washing dishes and a glass cake dish broke lacerating the dorsal aspect of his left thumb at the MP joint level.  He noted the inability to extend his thumb and sought medical attention.  He has no other injuries.  He has no other history of trauma to that area.  No Known Allergies  Outpatient Encounter Medications as of 11/02/2019  Medication Sig  . hydrochlorothiazide (HYDRODIURIL) 12.5 MG tablet Take 12.5 mg by mouth daily.  Marland Kitchen levothyroxine (SYNTHROID, LEVOTHROID) 100 MCG tablet Take 100 mcg by mouth daily before breakfast.  . losartan (COZAAR) 50 MG tablet Take 50 mg by mouth daily.  . ondansetron (ZOFRAN) 4 MG tablet Take 1 tablet (4 mg total) by mouth every 8 (eight) hours as needed for nausea or vomiting.   No facility-administered encounter medications on file as of 11/02/2019.     Past Medical History:  Diagnosis Date  . BPH (benign prostatic hyperplasia)   . Hypertension   . Hypothyroidism   . Macrocytosis     Past Surgical History:  Procedure Laterality Date  . COLONOSCOPY WITH PROPOFOL N/A 12/18/2018   Procedure: COLONOSCOPY WITH BIOPSY;  Surgeon: Lucilla Lame, MD;  Location: [facility];  Service: Endoscopy;  Laterality: N/A;  NEEDS TO STAY FIRST  . ESOPHAGOGASTRODUODENOSCOPY    . ESOPHAGOGASTRODUODENOSCOPY (EGD) WITH PROPOFOL N/A 02/23/2019   Procedure: ESOPHAGOGASTRODUODENOSCOPY (EGD) WITH PROPOFOL;  Surgeon: Lucilla Lame, MD;  Location: [facility] ENDOSCOPY;  Service: Endoscopy;  Laterality: N/A;  . LIPOMA EXCISION Right    upper arm  . POLYPECTOMY N/A 12/18/2018   Procedure: POLYPECTOMY;  Surgeon: Lucilla Lame, MD;  Location: [facility];  Service: Endoscopy;  Laterality: N/A;  .  UMBILICAL HERNIA REPAIR      No family history on file.  Social History   Social History Narrative  . Not on file     Review of Systems General: Denies fevers, chills, weight loss CV: Denies chest pain, shortness of breath, palpitations  Physical Exam Vitals with BMI 02/23/2019 02/23/2019 12/18/2018  Height - 5\' 10"  -  Weight - 190 lbs -  BMI - 0000000 -  Systolic 123XX123 0000000 -  Diastolic 74 87 -  Pulse 60 80 55    General:  No acute distress,  Alert and oriented, Non-Toxic, Normal speech and affect Left hand: Fingers are well perfused with normal capillary refill.  Sensation is intact throughout.  He has good range of motion of his fingers but is unable to extend his thumb.  He has good flexion of the FPL.  With his hand flat on the table he is unable to extend his thumb posterior to the plane of the hand.  There is a 2 cm laceration at the MP joint level with what looks like exposed and lacerated joint capsule.  Assessment/Plan Patient presents with a laceration to his left thumb.  It looks like his thumb extensors are completely cut.  I anesthetized the area with lidocaine with epinephrine irrigated copiously with soap and water and closed the laceration with interrupted 4-0 Prolene sutures.  We discussed the need for tendon repair.  We will plan to do this later this week in the surgery center.  I explained the postoperative  recovery which should include about 4 weeks of immobilization.  We will also get an x-ray tomorrow to make sure there is no fracture or obvious foreign body.  Cindra Presume 11/02/2019, 9:16 PM

## 2019-11-03 ENCOUNTER — Other Ambulatory Visit (HOSPITAL_COMMUNITY): Payer: 59

## 2019-11-03 ENCOUNTER — Ambulatory Visit
Admission: RE | Admit: 2019-11-03 | Discharge: 2019-11-03 | Disposition: A | Discharge status: Home / Self Care | Payer: 59 | Source: Ambulatory Visit | Attending: Plastic Surgery | Admitting: Plastic Surgery

## 2019-11-03 ENCOUNTER — Encounter (HOSPITAL_BASED_OUTPATIENT_CLINIC_OR_DEPARTMENT_OTHER): Payer: Self-pay | Admitting: Plastic Surgery

## 2019-11-03 ENCOUNTER — Other Ambulatory Visit: Payer: Self-pay

## 2019-11-03 ENCOUNTER — Other Ambulatory Visit
Admission: RE | Admit: 2019-11-03 | Discharge: 2019-11-03 | Disposition: A | Discharge status: Home / Self Care | Payer: 59 | Source: Ambulatory Visit | Attending: Plastic Surgery | Admitting: Plastic Surgery

## 2019-11-03 ENCOUNTER — Ambulatory Visit
Admission: RE | Admit: 2019-11-03 | Discharge: 2019-11-03 | Disposition: A | Discharge status: Home / Self Care | Payer: 59 | Attending: Plastic Surgery | Admitting: Plastic Surgery

## 2019-11-03 DIAGNOSIS — S66822A Laceration of other specified muscles, fascia and tendons at wrist and hand level, left hand, initial encounter: Secondary | ICD-10-CM | POA: Insufficient documentation

## 2019-11-03 DIAGNOSIS — X58XXXA Exposure to other specified factors, initial encounter: Secondary | ICD-10-CM | POA: Insufficient documentation

## 2019-11-03 DIAGNOSIS — Z20822 Contact with and (suspected) exposure to covid-19: Secondary | ICD-10-CM | POA: Insufficient documentation

## 2019-11-03 DIAGNOSIS — S61402A Unspecified open wound of left hand, initial encounter: Secondary | ICD-10-CM

## 2019-11-03 DIAGNOSIS — Z01818 Encounter for other preprocedural examination: Secondary | ICD-10-CM | POA: Diagnosis not present

## 2019-11-03 DIAGNOSIS — S6992XA Unspecified injury of left wrist, hand and finger(s), initial encounter: Secondary | ICD-10-CM | POA: Diagnosis not present

## 2019-11-04 LAB — SARS CORONAVIRUS 2 (TAT 6-24 HRS): SARS Coronavirus 2: NEGATIVE

## 2019-11-05 ENCOUNTER — Encounter (HOSPITAL_BASED_OUTPATIENT_CLINIC_OR_DEPARTMENT_OTHER): Payer: Self-pay | Admitting: Plastic Surgery

## 2019-11-05 ENCOUNTER — Encounter (HOSPITAL_BASED_OUTPATIENT_CLINIC_OR_DEPARTMENT_OTHER)
Admission: RE | Disposition: A | Discharge status: Home / Self Care | Payer: Self-pay | Source: Ambulatory Visit | Attending: Plastic Surgery

## 2019-11-05 ENCOUNTER — Ambulatory Visit (HOSPITAL_BASED_OUTPATIENT_CLINIC_OR_DEPARTMENT_OTHER): Payer: 59 | Admitting: Anesthesiology

## 2019-11-05 ENCOUNTER — Other Ambulatory Visit: Payer: Self-pay

## 2019-11-05 ENCOUNTER — Ambulatory Visit (HOSPITAL_BASED_OUTPATIENT_CLINIC_OR_DEPARTMENT_OTHER)
Admission: RE | Admit: 2019-11-05 | Discharge: 2019-11-05 | Disposition: A | Discharge status: Home / Self Care | Payer: 59 | Source: Ambulatory Visit | Attending: Plastic Surgery | Admitting: Plastic Surgery

## 2019-11-05 DIAGNOSIS — Z79890 Hormone replacement therapy (postmenopausal): Secondary | ICD-10-CM | POA: Diagnosis not present

## 2019-11-05 DIAGNOSIS — Z79899 Other long term (current) drug therapy: Secondary | ICD-10-CM | POA: Insufficient documentation

## 2019-11-05 DIAGNOSIS — Y93G1 Activity, food preparation and clean up: Secondary | ICD-10-CM | POA: Diagnosis not present

## 2019-11-05 DIAGNOSIS — I10 Essential (primary) hypertension: Secondary | ICD-10-CM | POA: Diagnosis not present

## 2019-11-05 DIAGNOSIS — C4442 Squamous cell carcinoma of skin of scalp and neck: Secondary | ICD-10-CM | POA: Diagnosis not present

## 2019-11-05 DIAGNOSIS — S56322A Laceration of extensor or abductor muscles, fascia and tendons of left thumb at forearm level, initial encounter: Secondary | ICD-10-CM | POA: Diagnosis not present

## 2019-11-05 DIAGNOSIS — W25XXXA Contact with sharp glass, initial encounter: Secondary | ICD-10-CM | POA: Insufficient documentation

## 2019-11-05 DIAGNOSIS — E039 Hypothyroidism, unspecified: Secondary | ICD-10-CM | POA: Insufficient documentation

## 2019-11-05 DIAGNOSIS — S61012A Laceration without foreign body of left thumb without damage to nail, initial encounter: Secondary | ICD-10-CM | POA: Diagnosis present

## 2019-11-05 HISTORY — DX: Unspecified open wound of unspecified finger without damage to nail, initial encounter: S61.209A

## 2019-11-05 HISTORY — PX: TENDON REPAIR: SHX5111

## 2019-11-05 HISTORY — DX: Laceration of extensor muscle, fascia and tendon of unspecified finger at forearm level, initial encounter: S56.429A

## 2019-11-05 LAB — BASIC METABOLIC PANEL
Anion gap: 15 (ref 5–15)
BUN: 9 mg/dL (ref 8–23)
CO2: 10 mmol/L — ABNORMAL LOW (ref 22–32)
Calcium: 6.5 mg/dL — ABNORMAL LOW (ref 8.9–10.3)
Chloride: 109 mmol/L (ref 98–111)
Creatinine, Ser: <0.3 mg/dL — ABNORMAL LOW (ref 0.61–1.24)
Glucose, Bld: 30 mg/dL — CL (ref 70–99)
Potassium: 4.1 mmol/L (ref 3.5–5.1)
Sodium: 134 mmol/L — ABNORMAL LOW (ref 135–145)

## 2019-11-05 LAB — GLUCOSE, CAPILLARY: Glucose-Capillary: 86 mg/dL (ref 70–99)

## 2019-11-05 SURGERY — TENDON REPAIR
Anesthesia: Monitor Anesthesia Care | Site: Thumb | Laterality: Left

## 2019-11-05 MED ORDER — LIDOCAINE 2% (20 MG/ML) 5 ML SYRINGE
INTRAMUSCULAR | Status: DC | PRN
  Administered 2019-11-05: 60 mg via INTRAVENOUS

## 2019-11-05 MED ORDER — FENTANYL CITRATE (PF) 100 MCG/2ML IJ SOLN: 25.0000 ug | INTRAMUSCULAR | Status: DC | PRN

## 2019-11-05 MED ORDER — CLONIDINE HCL (ANALGESIA) 100 MCG/ML EP SOLN
EPIDURAL | Status: DC | PRN
  Administered 2019-11-05: 50 ug

## 2019-11-05 MED ORDER — HYDROCODONE-ACETAMINOPHEN 5-325 MG PO TABS: 1.0000 | ORAL_TABLET | Freq: Four times a day (QID) | ORAL | 0 refills | Status: DC | PRN

## 2019-11-05 MED ORDER — CEFAZOLIN SODIUM-DEXTROSE 2-4 GM/100ML-% IV SOLN
2.0000 g | INTRAVENOUS | Status: AC
  Administered 2019-11-05: 2 g via INTRAVENOUS

## 2019-11-05 MED ORDER — LIDOCAINE-EPINEPHRINE 1 %-1:100000 IJ SOLN
INTRAMUSCULAR | Status: DC | PRN
  Administered 2019-11-05: 9 mL

## 2019-11-05 MED ORDER — CEFAZOLIN SODIUM-DEXTROSE 2-4 GM/100ML-% IV SOLN
INTRAVENOUS | Status: AC
  Filled 2019-11-05: qty 100

## 2019-11-05 MED ORDER — PROPOFOL 500 MG/50ML IV EMUL
INTRAVENOUS | Status: DC | PRN
  Administered 2019-11-05: 125 ug/kg/min via INTRAVENOUS

## 2019-11-05 MED ORDER — LACTATED RINGERS IV SOLN: INTRAVENOUS | Status: DC

## 2019-11-05 MED ORDER — ACETAMINOPHEN 500 MG PO TABS
ORAL_TABLET | ORAL | Status: AC
  Filled 2019-11-05: qty 2

## 2019-11-05 MED ORDER — FENTANYL CITRATE (PF) 100 MCG/2ML IJ SOLN
INTRAMUSCULAR | Status: AC
  Filled 2019-11-05: qty 2

## 2019-11-05 MED ORDER — PROPOFOL 10 MG/ML IV BOLUS
INTRAVENOUS | Status: DC | PRN
  Administered 2019-11-05: 40 mg via INTRAVENOUS

## 2019-11-05 MED ORDER — LIDOCAINE-EPINEPHRINE 1 %-1:100000 IJ SOLN
INTRAMUSCULAR | Status: AC
  Filled 2019-11-05: qty 1

## 2019-11-05 MED ORDER — MIDAZOLAM HCL 2 MG/2ML IJ SOLN
1.0000 mg | INTRAMUSCULAR | Status: DC | PRN
  Administered 2019-11-05: 2 mg via INTRAVENOUS

## 2019-11-05 MED ORDER — BUPIVACAINE-EPINEPHRINE (PF) 0.5% -1:200000 IJ SOLN
INTRAMUSCULAR | Status: DC | PRN
  Administered 2019-11-05: 30 mL via PERINEURAL

## 2019-11-05 MED ORDER — MIDAZOLAM HCL 2 MG/2ML IJ SOLN
INTRAMUSCULAR | Status: AC
  Filled 2019-11-05: qty 2

## 2019-11-05 MED ORDER — FENTANYL CITRATE (PF) 100 MCG/2ML IJ SOLN
50.0000 ug | INTRAMUSCULAR | Status: DC | PRN
  Administered 2019-11-05: 50 ug via INTRAVENOUS

## 2019-11-05 MED ORDER — ACETAMINOPHEN 500 MG PO TABS
1000.0000 mg | ORAL_TABLET | Freq: Once | ORAL | Status: AC | PRN
  Administered 2019-11-05: 1000 mg via ORAL

## 2019-11-05 MED ORDER — ONDANSETRON HCL 4 MG/2ML IJ SOLN
INTRAMUSCULAR | Status: DC | PRN
  Administered 2019-11-05: 4 mg via INTRAVENOUS

## 2019-11-05 SURGICAL SUPPLY — 77 items
BAG DECANTER FOR FLEXI CONT (MISCELLANEOUS) IMPLANT
BLADE MINI RND TIP GREEN BEAV (BLADE) IMPLANT
BLADE SURG 15 STRL LF DISP TIS (BLADE) ×1 IMPLANT
BLADE SURG 15 STRL SS (BLADE) ×2
BNDG COHESIVE 3X5 TAN STRL LF (GAUZE/BANDAGES/DRESSINGS) ×1 IMPLANT
BNDG ELASTIC 3X5.8 VLCR STR LF (GAUZE/BANDAGES/DRESSINGS) ×1 IMPLANT
BNDG ESMARK 4X9 LF (GAUZE/BANDAGES/DRESSINGS) ×2 IMPLANT
BNDG GAUZE ELAST 4 BULKY (GAUZE/BANDAGES/DRESSINGS) ×1 IMPLANT
CHLORAPREP W/TINT 26 (MISCELLANEOUS) ×2 IMPLANT
CORD BIPOLAR FORCEPS 12FT (ELECTRODE) ×2 IMPLANT
COVER BACK TABLE 60X90IN (DRAPES) ×2 IMPLANT
COVER MAYO STAND STRL (DRAPES) ×2 IMPLANT
COVER WAND RF STERILE (DRAPES) IMPLANT
CUFF TOURN SGL QUICK 18X4 (TOURNIQUET CUFF) ×2 IMPLANT
DECANTER SPIKE VIAL GLASS SM (MISCELLANEOUS) IMPLANT
DRAIN TLS ROUND 10FR (DRAIN) IMPLANT
DRAPE EXTREMITY T 121X128X90 (DISPOSABLE) ×2 IMPLANT
DRAPE SURG 17X23 STRL (DRAPES) ×2 IMPLANT
DRSG PAD ABDOMINAL 8X10 ST (GAUZE/BANDAGES/DRESSINGS) IMPLANT
GAUZE 4X4 16PLY RFD (DISPOSABLE) IMPLANT
GAUZE SPONGE 4X4 12PLY STRL (GAUZE/BANDAGES/DRESSINGS) ×2 IMPLANT
GAUZE XEROFORM 1X8 LF (GAUZE/BANDAGES/DRESSINGS) ×2 IMPLANT
GLOVE BIO SURGEON STRL SZ7 (GLOVE) ×1 IMPLANT
GLOVE BIO SURGEON STRL SZ7.5 (GLOVE) ×1 IMPLANT
GLOVE BIOGEL M STRL SZ7.5 (GLOVE) ×2 IMPLANT
GLOVE BIOGEL PI IND STRL 7.0 (GLOVE) IMPLANT
GLOVE BIOGEL PI IND STRL 8 (GLOVE) ×1 IMPLANT
GLOVE BIOGEL PI INDICATOR 7.0 (GLOVE) ×2
GLOVE BIOGEL PI INDICATOR 8 (GLOVE)
GOWN STRL REUS W/ TWL LRG LVL3 (GOWN DISPOSABLE) ×1 IMPLANT
GOWN STRL REUS W/TWL LRG LVL3 (GOWN DISPOSABLE) ×6
GOWN STRL REUS W/TWL XL LVL3 (GOWN DISPOSABLE) ×1 IMPLANT
LOOP VESSEL MAXI BLUE (MISCELLANEOUS) IMPLANT
NDL HYPO 25X1 1.5 SAFETY (NEEDLE) IMPLANT
NDL KEITH (NEEDLE) IMPLANT
NDL PRECISIONGLIDE 27X1.5 (NEEDLE) IMPLANT
NEEDLE HYPO 25X1 1.5 SAFETY (NEEDLE) ×2 IMPLANT
NEEDLE KEITH (NEEDLE) IMPLANT
NEEDLE PRECISIONGLIDE 27X1.5 (NEEDLE) IMPLANT
NS IRRIG 1000ML POUR BTL (IV SOLUTION) ×2 IMPLANT
PACK BASIN DAY SURGERY FS (CUSTOM PROCEDURE TRAY) ×2 IMPLANT
PAD CAST 3X4 CTTN HI CHSV (CAST SUPPLIES) ×1 IMPLANT
PAD CAST 4YDX4 CTTN HI CHSV (CAST SUPPLIES) IMPLANT
PADDING CAST ABS 3INX4YD NS (CAST SUPPLIES)
PADDING CAST ABS 4INX4YD NS (CAST SUPPLIES)
PADDING CAST ABS COTTON 3X4 (CAST SUPPLIES) IMPLANT
PADDING CAST ABS COTTON 4X4 ST (CAST SUPPLIES) ×1 IMPLANT
PADDING CAST COTTON 3X4 STRL (CAST SUPPLIES) ×2
PADDING CAST COTTON 4X4 STRL (CAST SUPPLIES)
SLEEVE SCD COMPRESS KNEE MED (MISCELLANEOUS) IMPLANT
SLING ARM FOAM STRAP LRG (SOFTGOODS) ×1 IMPLANT
SPLINT PLASTER CAST XFAST 3X15 (CAST SUPPLIES) IMPLANT
SPLINT PLASTER CAST XFAST 4X15 (CAST SUPPLIES) IMPLANT
SPLINT PLASTER XTRA FAST SET 4 (CAST SUPPLIES) ×15
SPLINT PLASTER XTRA FASTSET 3X (CAST SUPPLIES)
STOCKINETTE 4X48 STRL (DRAPES) ×2 IMPLANT
SUT CHROMIC 5 0 P 3 (SUTURE) IMPLANT
SUT ETHIBOND 3-0 V-5 (SUTURE) IMPLANT
SUT ETHILON 3 0 PS 1 (SUTURE) IMPLANT
SUT ETHILON 4 0 PS 2 18 (SUTURE) ×3 IMPLANT
SUT FIBERWIRE 4-0 18 DIAM BLUE (SUTURE)
SUT MERSILENE 2.0 SH NDLE (SUTURE) IMPLANT
SUT MERSILENE 4 0 P 3 (SUTURE) IMPLANT
SUT PROLENE 2 0 SH DA (SUTURE) IMPLANT
SUT PROLENE 5 0 P 3 (SUTURE) IMPLANT
SUT SILK 2 0 PERMA HAND 18 BK (SUTURE) ×1 IMPLANT
SUT SILK 4 0 PS 2 (SUTURE) IMPLANT
SUT SUPRAMID 4-0 (SUTURE) IMPLANT
SUT VIC AB 4-0 P-3 18XBRD (SUTURE) IMPLANT
SUT VIC AB 4-0 P3 18 (SUTURE)
SUT VICRYL 4-0 PS2 18IN ABS (SUTURE) IMPLANT
SUTURE FIBERWR 4-0 18 DIA BLUE (SUTURE) IMPLANT
SYR BULB 3OZ (MISCELLANEOUS) ×2 IMPLANT
SYR CONTROL 10ML LL (SYRINGE) ×1 IMPLANT
TOWEL GREEN STERILE FF (TOWEL DISPOSABLE) ×2 IMPLANT
TUBE FEEDING ENTERAL 5FR 16IN (TUBING) IMPLANT
UNDERPAD 30X36 HEAVY ABSORB (UNDERPADS AND DIAPERS) ×1 IMPLANT

## 2019-11-05 NOTE — Brief Op Note (Signed)
11/05/2019  2:05 PM  PATIENT:  Alexander Rojas  64 y.o. male  PRE-OPERATIVE DIAGNOSIS:  left thumb tendon laceration  POST-OPERATIVE DIAGNOSIS:  left thumb tendon laceration  PROCEDURE:  Procedure(s) with comments: LEFT THUMB TENDON REPAIR (Left) - Axillary block, MAC  SURGEON:  Surgeon(s) and Role:    * Jaylan Duggar, Steffanie Dunn, MD - Primary  PHYSICIAN ASSISTANT: Software engineer, PA  ASSISTANTS: none   ANESTHESIA:   regional  EBL:  2 mL   BLOOD ADMINISTERED:none  DRAINS: none   LOCAL MEDICATIONS USED:  LIDOCAINE   SPECIMEN:  No Specimen  DISPOSITION OF SPECIMEN:  N/A  COUNTS:  YES  TOURNIQUET:   Total Tourniquet Time Documented: Upper Arm (Left) - 43 minutes Total: Upper Arm (Left) - 43 minutes   DICTATION: .Viviann Spare Dictation  PLAN OF CARE: Discharge to home after PACU  PATIENT DISPOSITION:  PACU - hemodynamically stable.   Delay start of Pharmacological VTE agent (>24hrs) due to surgical blood loss or risk of bleeding: no

## 2019-11-05 NOTE — Progress Notes (Signed)
Dr Deatra Canter aware of lab results glucose of 30.  Pt asymptomatic and vitals stable. CBG done and result 86. No order to repeat BMET

## 2019-11-05 NOTE — Anesthesia Procedure Notes (Signed)
Anesthesia Regional Block: Axillary brachial plexus block   Pre-Anesthetic Checklist: ,, timeout performed, Correct Patient, Correct Site, Correct Laterality, Correct Procedure, Correct Position, site marked, Risks and benefits discussed,  Surgical consent,  Pre-op evaluation,  At surgeon's request and post-op pain management  Laterality: Left  Prep: chloraprep       Needles:  Injection technique: Single-shot  Needle Type: Echogenic Stimulator Needle     Needle Length: 9cm  Needle Gauge: 21     Additional Needles:   Procedures:, nerve stimulator,,, ultrasound used (permanent image in chart),,,,   Nerve Stimulator or Paresthesia:  Response: MC, Radial, MC , 0.5 mA,   Additional Responses:   Narrative:  Start time: 11/05/2019 11:22 AM End time: 11/05/2019 11:29 AM Injection made incrementally with aspirations every 5 mL.  Performed by: Personally  Anesthesiologist: Suzette Battiest, MD

## 2019-11-05 NOTE — Op Note (Signed)
Operative Note   DATE OF OPERATION: 11/05/2019  SURGICAL DEPARTMENT: Plastic Surgery  PREOPERATIVE DIAGNOSES: Left thumb laceration with injury to EPL and EPB  POSTOPERATIVE DIAGNOSES:  same  PROCEDURE: 1  Repair of left thumb extensor pollicis longus  2.  Repair of left thumb extensor pollicis brevis 3.  Simple closure of left thumb skin laceration 3 cm  SURGEON: Talmadge Coventry, MD  ASSISTANT: Verdie Shire, PA The advanced practice practitioner (APP) assisted throughout the case.  The APP was essential in retraction and counter traction when needed to make the case progress smoothly.  This retraction and assistance made it possible to see the tissue plans for the procedure.  The assistance was needed for blood control, tissue re-approximation and assisted with closure of the incision site.  ANESTHESIA:  General.   COMPLICATIONS: None.   INDICATIONS FOR PROCEDURE:  The patient, Alexander Rojas is a 64 y.o. male born on [birth year], is here for treatment of dorsal left thumb laceration that occurred earlier this week.  He cannot extend his thumb at the MP or IP joint and likely has completely lacerated both extensor tendons. MRN: [id number]  CONSENT:  Informed consent was obtained directly from the patient. Risks, benefits and alternatives were fully discussed. Specific risks including but not limited to bleeding, infection, hematoma, seroma, scarring, pain, contracture, asymmetry, wound healing problems, and need for further surgery were all discussed. We discussed potential tendon rupture and the need for 4 weeks of immobilization.  The patient did have an ample opportunity to have questions answered to satisfaction.   DESCRIPTION OF PROCEDURE:  The patient was taken to the operating room. SCDs were placed and Ancef antibiotics were given.  Regional anesthesia was administered.  The patient's operative site was prepped and draped in a sterile fashion. A time out was performed and all  information was confirmed to be correct.  I started by marking out a Alyse Low extension of his transverse laceration.  The arm was exsanguinated with Esmarch with a tourniquet inflated to 250 mmHg.  15 blade was used to extend his laceration for exposure.  The wound was heavily irrigated and I did not note any foreign bodies.  There was a dorsal laceration of the joint capsule and so the joint was irrigated copiously.  There was enough of the sagittal fibers intact so the EPL and EPB had really not retracted that far.  The EPB was lacerated right over the MP joint where it broadened out to insert into the proximal phalanx.  The EPL was lacerated right where he was starting to broaden out onto the dorsal aspect of the phalanx.  I did not see any obvious cutaneous nerves that could be repaired.  Thumb was held in extension and the EPB was repaired with several interrupted buried 4-0 Ethibond figure-of-eight sutures.  The EPL was able to be repaired with two 4-0 Ethibond modified Kessler sutures giving it a 4 strand repair.  I then used several 5-0 PDS figure-of-eight sutures to reapproximate the surrounding sagittal fibers and the EPL right at its site of laceration.  Tourniquet was let down hemostasis was obtained.  Skin closure was done with 4-0 nylon interrupted sutures.  He was then splinted with the wrist in slight extension and the thumb in full extension with a volar splint extending out beyond the IP joint.  Tourniquet time was 42 minutes.  The patient tolerated the procedure well.  There were no complications. The patient was allowed to wake from anesthesia, extubated and  taken to the recovery room in satisfactory condition.

## 2019-11-05 NOTE — Transfer of Care (Signed)
Immediate Anesthesia Transfer of Care Note  Patient: Alexander Rojas  Procedure(s) Performed: LEFT THUMB TENDON REPAIR (Left Thumb)  Patient Location: PACU  Anesthesia Type:MAC combined with regional for post-op pain  Level of Consciousness: drowsy and patient cooperative  Airway & Oxygen Therapy: Patient Spontanous Breathing and Patient connected to face mask oxygen  Post-op Assessment: Report given to RN and Post -op Vital signs reviewed and stable  Post vital signs: Reviewed and stable  Last Vitals:  Vitals Value Taken Time  BP 94/43 11/05/19 1352  Temp    Pulse 70 11/05/19 1354  Resp 14 11/05/19 1354  SpO2 99 % 11/05/19 1354  Vitals shown include unvalidated device data.  Last Pain:  Vitals:   11/05/19 1125  TempSrc:   PainSc: 0-No pain      Patients Stated Pain Goal: (P) 3 (54/65/03 5465)  Complications: No apparent anesthesia complications

## 2019-11-05 NOTE — Interval H&P Note (Signed)
Hand P r viewed. Patient examined. No changes. Proceed w left thumb tendon repair.

## 2019-11-05 NOTE — Anesthesia Postprocedure Evaluation (Signed)
Anesthesia Post Note  Patient: Alexander Rojas  Procedure(s) Performed: LEFT THUMB TENDON REPAIR (Left Thumb)     Patient location during evaluation: PACU Anesthesia Type: Regional Level of consciousness: awake and alert Pain management: pain level controlled Vital Signs Assessment: post-procedure vital signs reviewed and stable Respiratory status: spontaneous breathing, nonlabored ventilation, respiratory function stable and patient connected to nasal cannula oxygen Cardiovascular status: stable and blood pressure returned to baseline Postop Assessment: no apparent nausea or vomiting Anesthetic complications: no    Last Vitals:  Vitals:   11/05/19 1430 11/05/19 1445  BP: 133/81 (!) 141/83  Pulse: (!) 54 (!) 57  Resp: 18 13  Temp:    SpO2: 92% 97%    Last Pain:  Vitals:   11/05/19 1445  TempSrc:   PainSc: 8                  Tiajuana Amass

## 2019-11-05 NOTE — Discharge Instructions (Signed)
No heavy activities. Elevate arm to reduce swelling.  Diet: Regular  Wound Care: Keep dressing clean & dry.  Do not get splint wet.  You can shower but make sure to place a bag over the splint with a good seal to keep it dry.  Do not remove dressing.  Call doctor if any unusual problems occur such as pain, excessive bleeding, unrelieved nausea/vomiting, fever &/or chills.  Follow-up appointment: Should be seen by hand therapist for splint change within two weeks followed by appointment with Dr. Claudia Desanctis.  Post Anesthesia Home Care Instructions  Activity: Get plenty of rest for the remainder of the day. A responsible individual must stay with you for 24 hours following the procedure.  For the next 24 hours, DO NOT: -Drive a car -Paediatric nurse -Drink alcoholic beverages -Take any medication unless instructed by your physician -Make any legal decisions or sign important papers.  Meals: Start with liquid foods such as gelatin or soup. Progress to regular foods as tolerated. Avoid greasy, spicy, heavy foods. If nausea and/or vomiting occur, drink only clear liquids until the nausea and/or vomiting subsides. Call your physician if vomiting continues.  Special Instructions/Symptoms: Your throat may feel dry or sore from the anesthesia or the breathing tube placed in your throat during surgery. If this causes discomfort, gargle with warm salt water. The discomfort should disappear within 24 hours.  If you had a scopolamine patch placed behind your ear for the management of post- operative nausea and/or vomiting:  1. The medication in the patch is effective for 72 hours, after which it should be removed.  Wrap patch in a tissue and discard in the trash. Wash hands thoroughly with soap and water. 2. You may remove the patch earlier than 72 hours if you experience unpleasant side effects which may include dry mouth, dizziness or visual disturbances. 3. Avoid touching the patch. Wash your hands  with soap and water after contact with the patch.      Call your surgeon if you experience:   1.  Fever over 101.0. 2.  Inability to urinate. 3.  Nausea and/or vomiting. 4.  Extreme swelling or bruising at the surgical site. 5.  Continued bleeding from the incision. 6.  Increased pain, redness or drainage from the incision. 7.  Problems related to your pain medication. 8.  Any problems and/or concerns

## 2019-11-05 NOTE — Progress Notes (Signed)
Assisted Dr. Rob Fitzgerald with left, ultrasound guided, axillary block. Side rails up, monitors on throughout procedure. See vital signs in flow sheet. Tolerated Procedure well. 

## 2019-11-05 NOTE — Anesthesia Preprocedure Evaluation (Signed)
Anesthesia Evaluation  Patient identified by MRN, date of birth, ID band Patient awake    Reviewed: Allergy & Precautions, NPO status , Patient's Chart, lab work & pertinent test results  Airway Mallampati: II  TM Distance: >3 FB Neck ROM: Full    Dental   Pulmonary neg pulmonary ROS,    breath sounds clear to auscultation       Cardiovascular hypertension, Pt. on medications  Rhythm:Regular Rate:Normal     Neuro/Psych negative neurological ROS     GI/Hepatic negative GI ROS, Neg liver ROS,   Endo/Other  Hypothyroidism   Renal/GU negative Renal ROS     Musculoskeletal   Abdominal   Peds  Hematology negative hematology ROS (+)   Anesthesia Other Findings   Reproductive/Obstetrics                             Anesthesia Physical Anesthesia Plan  ASA: II  Anesthesia Plan: Regional and MAC   Post-op Pain Management:    Induction:   PONV Risk Score and Plan: 1 and Propofol infusion, Ondansetron and Treatment may vary due to age or medical condition  Airway Management Planned: Natural Airway and Simple Face Mask  Additional Equipment:   Intra-op Plan:   Post-operative Plan:   Informed Consent: I have reviewed the patients History and Physical, chart, labs and discussed the procedure including the risks, benefits and alternatives for the proposed anesthesia with the patient or authorized representative who has indicated his/her understanding and acceptance.       Plan Discussed with: CRNA  Anesthesia Plan Comments:         Anesthesia Quick Evaluation

## 2019-11-08 ENCOUNTER — Encounter: Payer: Self-pay | Admitting: *Deleted

## 2019-11-11 ENCOUNTER — Ambulatory Visit (INDEPENDENT_AMBULATORY_CARE_PROVIDER_SITE_OTHER): Payer: 59 | Admitting: Plastic Surgery

## 2019-11-11 ENCOUNTER — Other Ambulatory Visit: Payer: Self-pay

## 2019-11-11 ENCOUNTER — Encounter: Payer: Self-pay | Admitting: Plastic Surgery

## 2019-11-11 VITALS — BP 132/84 | HR 65 | Temp 98.6°F | Ht 70.0 in | Wt 198.4 lb

## 2019-11-11 DIAGNOSIS — S66822A Laceration of other specified muscles, fascia and tendons at wrist and hand level, left hand, initial encounter: Secondary | ICD-10-CM

## 2019-11-11 DIAGNOSIS — S61402A Unspecified open wound of left hand, initial encounter: Secondary | ICD-10-CM

## 2019-11-11 DIAGNOSIS — E785 Hyperlipidemia, unspecified: Secondary | ICD-10-CM | POA: Diagnosis not present

## 2019-11-11 DIAGNOSIS — I10 Essential (primary) hypertension: Secondary | ICD-10-CM | POA: Diagnosis not present

## 2019-11-11 DIAGNOSIS — Z125 Encounter for screening for malignant neoplasm of prostate: Secondary | ICD-10-CM | POA: Diagnosis not present

## 2019-11-11 NOTE — Progress Notes (Signed)
Patient comes in about 1 week out from his thumb extensor repair with concerns about his dressing.  From surgery standpoint he feels like he is doing great and has noted the ability to slightly extend his thumb IP joint in a splint.  His splint has stayed on and dry but it is bothering him proximally in the forearm and feels like the padding has thinned out a bit in that area.  He is scheduled for a splint change next week with hand therapy and will follow their protocol.  I took the Ace wrap office plan to remove the plaster and added some more Kerlix padding to the distal thumb and proximal forearm.  I replaced the plaster and secured it with Ace wraps and he said it felt much more comfortable after doing that.  The rest of his fingers are moving freely with no issue in the tip of his thumb looks to have a normal color and sensation.  His sutures will come out at about the 2-week mark.  He is planning to have these taken out in his office however I am happy to see him if there is a need or any concern about the wound.  He is overall very happy with his progress and I think he is coming along nicely.

## 2019-11-17 ENCOUNTER — Ambulatory Visit: Payer: 59 | Attending: Plastic Surgery | Admitting: *Deleted

## 2019-11-17 ENCOUNTER — Other Ambulatory Visit: Payer: Self-pay

## 2019-11-17 ENCOUNTER — Encounter: Payer: Self-pay | Admitting: *Deleted

## 2019-11-17 DIAGNOSIS — M25542 Pain in joints of left hand: Secondary | ICD-10-CM | POA: Diagnosis not present

## 2019-11-17 DIAGNOSIS — R278 Other lack of coordination: Secondary | ICD-10-CM | POA: Insufficient documentation

## 2019-11-17 DIAGNOSIS — M6281 Muscle weakness (generalized): Secondary | ICD-10-CM | POA: Diagnosis not present

## 2019-11-17 DIAGNOSIS — R600 Localized edema: Secondary | ICD-10-CM | POA: Diagnosis not present

## 2019-11-17 NOTE — Patient Instructions (Signed)
WEARING SCHEDULE:  Wear splint at ALL times except for hygiene care (May remove splint for exercises and then immediately place back on ONLY if directed by the therapist)  PURPOSE:  To prevent movement and for protection until injury can heal  CARE OF SPLINT:  Keep splint away from heat sources including: stove, radiator or furnace, or a car in sunlight. The splint can melt and will no longer fit you properly  Keep away from pets and children  Clean the splint with rubbing alcohol. * During this time, make sure you also clean your hand/arm as instructed by your therapist and/or perform dressing changes as needed. Then dry hand/arm completely before replacing splint. (When cleaning hand/arm, keep it immobilized in same position until splint is replaced)  PRECAUTIONS/POTENTIAL PROBLEMS: *If you notice or experience increased pain, swelling, numbness, or a lingering reddened area from the splint: Contact your therapist immediately by calling [phone]. You must wear the splint for protection, but we will get you scheduled for adjustments as quickly as possible.  (If only straps or hooks need to be replaced and NO adjustments to the splint need to be made, just call the office ahead and let them know you are coming in)  If you have any medical concerns or signs of infection, please call your doctor immediately

## 2019-11-17 NOTE — Therapy (Signed)
[facility] [address] [place], [place], [address] Phone: [phone]   Fax:  [phone]  Occupational Therapy Evaluation  Patient Details  Name: Alexander Rojas MRN: [id number] Date of Birth: 1956/06/19 Referring Provider (OT): Dr Steffanie Dunn. Pace   Encounter Date: 11/17/2019  OT End of Session - 11/17/19 0944    Visit Number  1    Number of Visits  12    Date for OT Re-Evaluation  12/08/19    Authorization Type  Pt is [place]    OT Start Time  [phone]    OT Stop Time  [phone]    OT Time Calculation (min)  89 min    Activity Tolerance  Patient tolerated treatment well    Behavior During Therapy  [facility] for tasks assessed/performed       Past Medical History:  Diagnosis Date  . BPH (benign prostatic hyperplasia)   . Extensor tendon laceration, finger, open wound, initial encounter    left thumb  . Hypertension   . Hypothyroidism   . Macrocytosis     Past Surgical History:  Procedure Laterality Date  . COLONOSCOPY WITH PROPOFOL N/A 12/18/2018   Procedure: COLONOSCOPY WITH BIOPSY;  Surgeon: Lucilla Lame, MD;  Location: [place];  Service: Endoscopy;  Laterality: N/A;  NEEDS TO STAY FIRST  . ESOPHAGOGASTRODUODENOSCOPY    . ESOPHAGOGASTRODUODENOSCOPY (EGD) WITH PROPOFOL N/A 02/23/2019   Procedure: ESOPHAGOGASTRODUODENOSCOPY (EGD) WITH PROPOFOL;  Surgeon: Lucilla Lame, MD;  Location: [facility] ENDOSCOPY;  Service: Endoscopy;  Laterality: N/A;  . HERNIA REPAIR    . LIPOMA EXCISION Right    upper arm  . POLYPECTOMY N/A 12/18/2018   Procedure: POLYPECTOMY;  Surgeon: Lucilla Lame, MD;  Location: [place];  Service: Endoscopy;  Laterality: N/A;  . TENDON REPAIR Left 11/05/2019   Procedure: LEFT THUMB TENDON REPAIR;  Surgeon: Cindra Presume, MD;  Location: [place];  Service: Plastics;  Laterality: Left;  Axillary block, MAC  . UMBILICAL HERNIA REPAIR       There were no vitals filed for this visit.  Subjective Assessment - 11/17/19 0756    Subjective   Pt is a right hand dominant male s/p laceration to Left EPL/EPB after washing dishes. He underwent repair via Dr Claudia Desanctis on 11/05/2019 and presents to out-pt OT for custom protective splinting following this injury.    Currently in Pain?  Yes    Pain Score  5     Pain Location  Hand    Pain Orientation  Left    Pain Descriptors / Indicators  Aching;Other (Comment);Burning    Pain Type  Acute pain    Pain Onset  1 to 4 weeks ago    Pain Frequency  Constant    Aggravating Factors   Pt reports L forearm feels like its "Chaffing, burning and itching undering the" post-op dressing.    Multiple Pain Sites  No        [facility] OT Assessment - 11/17/19 0001      Assessment   Medical Diagnosis  L Thumb EPL/EPB repair    Referring Provider (OT)  Dr Steffanie Dunn. Pace    Onset Date/Surgical Date  11/05/19    Hand Dominance  Right    Next MD Visit  --   Not currently scheduled, pt to call and F/U     Precautions   Precautions  Other (comment)   Protective splinting L thumb/wrist  Required Braces or Orthoses  Other Brace/Splint   See above     Balance Screen   Has the patient fallen in the past 6 months  No    Has the patient had a decrease in activity level because of a fear of falling?   No      Home  Environment   Family/patient expects to be discharged to:  Private residence    Living Arrangements  Spouse/significant other    Lives With  Significant other      Prior Function   Level of [place]  Full time employment   Cardiothoracic Surgeon   Vocation Requirements  --   Works full time,cardiothoracic surgeon,using bilateral hands     ADL   ADL comments  Pt is Mod I using right dominant hand for activities.   He is not currently performing surgery secondary to L injury     Mobility   Mobility Status  Independent      Written Expression   Dominant  Hand  Right      Vision - History   Baseline Vision  Wears glasses all the time      Activity Tolerance   Activity Tolerance  Endurance does not limit participation in activity      Cognition   Overall Cognitive Status  Within Functional Limits for tasks assessed      Observation/Other Assessments   Observations  Pt reports paresthesias L dorsal forearm due to post-op cast/kerlix "rubbing and chaffing". Some skin reddness was observed visually in clinic today.   Pt was educated to keep arm/splint dry and monitor forearm.    Skin Integrity  See above      Sensation   Light Touch  Impaired by gross assessment   Volar sensation in intact, dorsal sensibility is diminished    Additional Comments  Diminished light touch noted Left dorsal thumb/first dorsal compartment, especially along z plasty/scar.      Coordination   Gross Motor Movements are Fluid and Coordinated  Not tested    Fine Motor Movements are Fluid and Coordinated  Not tested      Edema   Edema  Min edema noted L thumb and digits upon visual observation in clinic today.       ROM / Strength   AROM / PROM / Strength  AROM;Strength   NT secondary to EPL/EPB repair L thumb.     AROM   Overall AROM   Deficits;Unable to assess   NT secondary to surgical repair extensor tendons     Strength   Overall Strength  Deficits;Unable to assess   NT secondary to recent surgical repair of extensor tendons      OT Treatments/Exercises (OP) - 11/17/19 0001      Splinting   Splinting  Pt's bulky post-op dressing was removed in clinic today following repair to EPL/EPB L thumb on 11/05/2019 by Dr Steffanie Dunn. Pace. Pt is w/ noted well healed Z-plasty scar with sutures intact on day 12 post-op. Pt states that he will f/u w/ MD office re-removal of sutures asap. Pt was then fitted with a custom splint that places his L wrist in slight extension, and thumb slightly rotated in radial and palmer abduction. Pt was educated in splinting use,  care and precautions and was instructed ito wear splint at all times and no functiona luse L hand/thumb for ADL's or work related activities at this time. He verbalied understanding of this in clinic  today. Pt will benefit from f/u in clinic closer to [facility], [place] as he is a Chemical engineer at [employer]. Pt will require splint check and adjustments as well as instruction in home program as per tendon/wound and scar healing.         WEARING SCHEDULE:  Wear splint at ALL times except for hygiene care (May remove splint for exercises and then immediately place back on ONLY if directed by the therapist)  PURPOSE:  To prevent movement and for protection until injury can heal  CARE OF SPLINT:  Keep splint away from heat sources including: stove, radiator or furnace, or a car in sunlight. The splint can melt and will no longer fit you properly  Keep away from pets and children  Clean the splint with rubbing alcohol. * During this time, make sure you also clean your hand/arm as instructed by your therapist and/or perform dressing changes as needed. Then dry hand/arm completely before replacing splint. (When cleaning hand/arm, keep it immobilized in same position until splint is replaced)  PRECAUTIONS/POTENTIAL PROBLEMS: *If you notice or experience increased pain, swelling, numbness, or a lingering reddened area from the splint: Contact your therapist immediately by calling [phone]. You must wear the splint for protection, but we will get you scheduled for adjustments as quickly as possible.  (If only straps or hooks need to be replaced and NO adjustments to the splint need to be made, just call the office ahead and let them know you are coming in)  If you have any medical concerns or signs of infection, please call your doctor immediately     OT Education - 11/17/19 3810    Education Details  Splinting use, care and precautions L wrist/thumb. Edema management, gentle  A/ROM elbow, shoulder to assist in preventing stiffness.    Person(s) Educated  Patient    Methods  Explanation;Demonstration;Handout    Comprehension  Verbalized understanding;Returned demonstration       OT Short Term Goals - 11/17/19 0958      OT SHORT TERM GOAL #1   Title  Pt will Mod I splint use, care and precautions L thumb/wrist/forearm    Time  4    Period  Weeks    Status  New    Target Date  12/15/19      OT SHORT TERM GOAL #2   Title  Pt wil lbe Mod  I scar management L dorsal thumb    Time  4    Period  Weeks    Status  New    Target Date  12/15/19      OT SHORT TERM GOAL #3   Title  Pt will be Mod I HEP L thumb/wrist    Time  4    Period  Weeks    Status  New    Target Date  12/15/19        OT Long Term Goals - 11/17/19 0959      OT LONG TERM GOAL #1   Title  Pt will be Mod I upgraded HEP & scar management L thumb    Time  8    Period  Weeks    Status  New    Target Date  12/29/19      OT LONG TERM GOAL #2   Title  Pt will demonstrate functional flexion/A/ROM L thumb as seen by ability to oppose thumb to all digits 1-5 and to the base of small finger in preparation of holding surgical tools  Time  8    Period  Weeks    Status  New    Target Date  12/29/19      OT LONG TERM GOAL #3   Title  Pt will demonstrate functional active extension, abduction and rotation L thumb as seen by goniometer measurement in preparation for return to work related tasks    Time  8    Period  Weeks    Status  New    Target Date  12/29/19      OT LONG TERM GOAL #4   Title  Pt will demonstrate sensibility WFL's L thumb as indicated by semmes weinstein monofilaments assessment of 2.83 and subjective reports of decreased paresthesias dorsally.    Time  8    Period  Weeks    Status  New    Target Date  12/29/19            Plan - 11/17/19 0946    Clinical Impression Statement  Pt is a pleasant 64 y/o right hand dominant male s/p L thumb EPL/EPB repair on  11/05/2019 following a laceration while washing dishes at home. 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 Pace. A custom protective splint was fabricated placting his L wrist in slight  extension with slight rotation and abd of thumb. Pt was educated in splinting use, care and precautions. He will benefit from continued out-pt OT/hand therapyto upgrade home program for AROM as per extensor tendon healing as well as scar & edema management and splint adjustments/upgrades as needed. Of note, he does still have his sutures still in place 12 days post-op and was educated to call Dr Keane Scrape office & schedule an appointment to have these removed. He verbalized undertsanding of this.    OT Occupational Profile and History  Problem Focused Assessment - Including review of records relating to presenting problem    Occupational performance deficits (Please refer to evaluation for details):  ADL's;IADL's;Work    Body Structure / Function / Physical Skills  ADL;Dexterity;ROM;Edema;Scar mobility;Sensation;Mobility;Skin integrity;Flexibility;Strength;Coordination;FMC;Pain;UE functional use    Rehab Potential  Good    Clinical Decision Making  Limited treatment options, no task modification necessary    Comorbidities Affecting Occupational Performance:  None    Modification or Assistance to Complete Evaluation   No modification of tasks or assist necessary to complete eval    OT Frequency  Other (comment)   1-2x/week over next 8 weeks (12 visits)   OT Duration  8 weeks    OT Treatment/Interventions  Self-care/ADL training;Fluidtherapy;Splinting;Therapeutic activities;Ultrasound;Therapeutic exercise;Scar mobilization;Cryotherapy;Passive range of motion;Manual Therapy;Patient/family education    Plan  L thumb Splint check and adjustments PRN (? extend IP piece PRN and increase rotatation/palmer abd as able to tolerate for increased functional position), begin/upgrade home  program as per tendon healing protocol & scar management.Marland Kitchen    Consulted and Agree with Plan of Care  Patient       Patient will benefit from skilled therapeutic intervention in order to improve the following deficits and impairments:   Body Structure / Function / Physical Skills: ADL, Dexterity, ROM, Edema, Scar mobility, Sensation, Mobility, Skin integrity, Flexibility, Strength, Coordination, FMC, Pain, UE functional use       Visit Diagnosis: Pain in joint of left hand - Plan: Ot plan of care cert/re-cert  Other lack of coordination - Plan: Ot plan of care cert/re-cert  Muscle weakness (generalized) - Plan: Ot plan of care cert/re-cert  Localized edema -  Plan: Ot plan of care cert/re-cert    Problem List There are no problems to display for this patient.   [address], Alexander Rojas, OTR/L 11/17/2019, 10:16 AM  [facility] [address] [place], [place], [address] Phone: [phone]   Fax:  [phone]  Name: Alexander Rojas MRN: [id number] Date of Birth: 1956-05-06

## 2019-11-25 ENCOUNTER — Ambulatory Visit: Payer: 59 | Attending: Plastic Surgery | Admitting: Occupational Therapy

## 2019-11-25 ENCOUNTER — Other Ambulatory Visit: Payer: Self-pay

## 2019-11-25 DIAGNOSIS — M6281 Muscle weakness (generalized): Secondary | ICD-10-CM | POA: Diagnosis not present

## 2019-11-25 DIAGNOSIS — R278 Other lack of coordination: Secondary | ICD-10-CM | POA: Diagnosis not present

## 2019-11-25 DIAGNOSIS — R600 Localized edema: Secondary | ICD-10-CM

## 2019-11-25 DIAGNOSIS — M25542 Pain in joints of left hand: Secondary | ICD-10-CM | POA: Diagnosis not present

## 2019-11-25 NOTE — Therapy (Signed)
[facility] [address], [place], [address] Phone: [phone]   Fax:  [phone]  Occupational Therapy Treatment  Patient Details  Name: Alexander Rojas MRN: [id number] Date of Birth: 14-Jul-1956 Referring Provider (OT): Dr Steffanie Dunn. Pace   Encounter Date: 11/25/2019  OT End of Session - 11/25/19 0919    Visit Number  2    Number of Visits  12    Date for OT Re-Evaluation  01/12/20    Authorization Type  Pt is [place]    OT Start Time  [phone]    OT Stop Time  [phone]    OT Time Calculation (min)  40 min    Activity Tolerance  Patient tolerated treatment well    Behavior During Therapy  [facility] for tasks assessed/performed       Past Medical History:  Diagnosis Date  . BPH (benign prostatic hyperplasia)   . Extensor tendon laceration, finger, open wound, initial encounter    left thumb  . Hypertension   . Hypothyroidism   . Macrocytosis     Past Surgical History:  Procedure Laterality Date  . COLONOSCOPY WITH PROPOFOL N/A 12/18/2018   Procedure: COLONOSCOPY WITH BIOPSY;  Surgeon: Lucilla Lame, MD;  Location: [place];  Service: Endoscopy;  Laterality: N/A;  NEEDS TO STAY FIRST  . ESOPHAGOGASTRODUODENOSCOPY    . ESOPHAGOGASTRODUODENOSCOPY (EGD) WITH PROPOFOL N/A 02/23/2019   Procedure: ESOPHAGOGASTRODUODENOSCOPY (EGD) WITH PROPOFOL;  Surgeon: Lucilla Lame, MD;  Location: [facility] ENDOSCOPY;  Service: Endoscopy;  Laterality: N/A;  . HERNIA REPAIR    . LIPOMA EXCISION Right    upper arm  . POLYPECTOMY N/A 12/18/2018   Procedure: POLYPECTOMY;  Surgeon: Lucilla Lame, MD;  Location: [place];  Service: Endoscopy;  Laterality: N/A;  . TENDON REPAIR Left 11/05/2019   Procedure: LEFT THUMB TENDON REPAIR;  Surgeon: Cindra Presume, MD;  Location: [place];  Service: Plastics;  Laterality: Left;  Axillary block, MAC  . UMBILICAL HERNIA REPAIR       There were no vitals filed for this visit.  Subjective Assessment - 11/25/19 0913    Subjective   I need this hand - it is my livelihood - luckily I am R handed - no pain really - but this splint gets sweaty and smells    Patient Stated Goals  Need my hand to use in surgery for clap - need to be able to make pinch grip- and use my hand in yardwork, fishing    Currently in Pain?  Yes    Pain Score  1     Pain Location  Hand    Pain Orientation  Left    Pain Descriptors / Indicators  Aching;Tightness    Pain Type  Surgical pain    Pain Onset  1 to 4 weeks ago    Pain Frequency  Intermittent        Pt arrrive with splint on - did provide pt with some Tubigrip D to wear under splint and provided some extra moldskin in splint  Pt still has some scabs present - but can start scar massage on area where scar is healed   Did some light coban wrap from DIP to MP - and pt ed on doing inbetween HEP - in splint for compression- decrease edema             OT Treatments/Exercises (OP) - 11/25/19  0001      LUE Contrast Bath   Time  9 minutes    Comments  --   prior to AROM - decrease edema and stiffness     Observe in session that pt did touch 2nd with thumb , was able to maintain composite thumb extention  And during flexion of IP - did bring it up actively in extention  HEP pt to keep thumb at 45 degrees ABD - 2nd digit height And do stabilization at side of IP - do AROM 20 degrees and PROM to full extention And same with MC flexion to 25 degrees and PROM to full extention  To do 5 x day after contrast  And splint on in between  Precautions review and what to avoid   pt to follow up with surgeon on 8th and will follow up with me 9th Febr        OT Education - 11/25/19 0918    Education Details  splinting , precautions L wrist and thumb , edema control , HEP for AROM    Person(s) Educated  Patient    Methods  Explanation;Demonstration;Handout    Comprehension   Verbalized understanding;Returned demonstration       OT Short Term Goals - 11/17/19 0958      OT SHORT TERM GOAL #1   Title  Pt will Mod I splint use, care and precautions L thumb/wrist/forearm    Time  4    Period  Weeks    Status  New    Target Date  12/15/19      OT SHORT TERM GOAL #2   Title  Pt wil lbe Mod  I scar management L dorsal thumb    Time  4    Period  Weeks    Status  New    Target Date  12/15/19      OT SHORT TERM GOAL #3   Title  Pt will be Mod I HEP L thumb/wrist    Time  4    Period  Weeks    Status  New    Target Date  12/15/19        OT Long Term Goals - 11/17/19 0959      OT LONG TERM GOAL #1   Title  Pt will be Mod I upgraded HEP & scar management L thumb    Time  8    Period  Weeks    Status  New    Target Date  12/29/19      OT LONG TERM GOAL #2   Title  Pt will demonstrate functional flexion/A/ROM L thumb as seen by ability to oppose thumb to all digits 1-5 and to the base of small finger in preparation of holding surgical tools    Time  8    Period  Weeks    Status  New    Target Date  12/29/19      OT LONG TERM GOAL #3   Title  Pt will demonstrate functional active extension, abduction and rotation L thumb as seen by goniometer measurement in preparation for return to work related tasks    Time  8    Period  Weeks    Status  New    Target Date  12/29/19      OT LONG TERM GOAL #4   Title  Pt will demonstrate sensibility WFL's L thumb as indicated by semmes weinstein monofilaments assessment of 2.83 and subjective reports of decreased paresthesias dorsally.  Time  8    Period  Weeks    Status  New    Target Date  12/29/19            Plan - 11/25/19 4270    Clinical Impression Statement  Pt is tomorrow 3 wks s/p L thumb EPL/EPB repair on 11/05/2019 - upon review of OP note , suture tech , and occupation - following combination of [place] hand protocol and [facility]  teaching [facility] protocol - extensor Zone T 4  protocol -  started 20 degrees of AROM of thumb IP and MP with PROM for extention - review with pt precautions and what to NOT DO - HEP 5 xday - and use contrast for edema and stiffness - scabs still present - can start scar massage when ready -and light coban wrap for edema - cont splint at all times inbetween HEP - did notice  pt able to hold composite extention of thumb actively and IP extention from -20 to 0    OT Occupational Profile and History  Problem Focused Assessment - Including review of records relating to presenting problem    Occupational performance deficits (Please refer to evaluation for details):  ADL's;IADL's;Work    Body Structure / Function / Physical Skills  ADL;Dexterity;ROM;Edema;Scar mobility;Sensation;Mobility;Skin integrity;Flexibility;Strength;Coordination;FMC;Pain;UE functional use    Rehab Potential  Good    Clinical Decision Making  Limited treatment options, no task modification necessary    Comorbidities Affecting Occupational Performance:  None    Modification or Assistance to Complete Evaluation   No modification of tasks or assist necessary to complete eval    OT Frequency  Other (comment)    OT Duration  --   7 wks   OT Treatment/Interventions  Self-care/ADL training;Fluidtherapy;Splinting;Therapeutic activities;Ultrasound;Therapeutic exercise;Scar mobilization;Cryotherapy;Passive range of motion;Manual Therapy;Patient/family education    Plan  assess HEP progress, tolerance , scar massage initiate, upgrade HEP to tolerance    Consulted and Agree with Plan of Care  Patient       Patient will benefit from skilled therapeutic intervention in order to improve the following deficits and impairments:   Body Structure / Function / Physical Skills: ADL, Dexterity, ROM, Edema, Scar mobility, Sensation, Mobility, Skin integrity, Flexibility, Strength, Coordination, FMC, Pain, UE functional use       Visit Diagnosis: Pain in joint of left hand  Other lack of  coordination  Muscle weakness (generalized)  Localized edema    Problem List There are no problems to display for this patient.   Rosalyn Gess OTR/L,CLT 11/25/2019, 9:31 AM  [facility] [address] [place], [address] Phone: [phone]   Fax:  [phone]  Name: Alexander Rojas MRN: [id number] Date of Birth: 25-Sep-1956

## 2019-11-25 NOTE — Patient Instructions (Signed)
Pt to do 5 x day - contrast  And then block IP of thumb at sides - flexion of IP 20 degrees and PROM back into extention 10 reps MP thumb flexion 25 degrees , PROM back to extention  10 reps  Slight pull or stretch less than 2/10  Light coban compression on thumb - in splint - off with HEP  Splint on at all times in between  Review with pt precautions - what to avoid

## 2019-11-30 ENCOUNTER — Ambulatory Visit: Payer: 59 | Admitting: Occupational Therapy

## 2019-11-30 ENCOUNTER — Other Ambulatory Visit: Payer: Self-pay

## 2019-11-30 DIAGNOSIS — M25542 Pain in joints of left hand: Secondary | ICD-10-CM

## 2019-11-30 DIAGNOSIS — R600 Localized edema: Secondary | ICD-10-CM | POA: Diagnosis not present

## 2019-11-30 DIAGNOSIS — R278 Other lack of coordination: Secondary | ICD-10-CM

## 2019-11-30 DIAGNOSIS — M6281 Muscle weakness (generalized): Secondary | ICD-10-CM

## 2019-11-30 NOTE — Patient Instructions (Signed)
See note

## 2019-11-30 NOTE — Therapy (Signed)
[facility] [address], [place], [address] Phone: [phone]   Fax:  [phone]  Occupational Therapy Treatment  Patient Details  Name: Alexander Rojas MRN: [id number] Date of Birth: 10/04/1956 Referring Provider (OT): Dr Steffanie Dunn. [place]   Encounter Date: 11/30/2019  OT End of Session - 11/30/19 1755    Visit Number  3    Number of Visits  12    Date for OT Re-Evaluation  01/12/20    Authorization Type  Pt is [place]    OT Start Time  1620    OT Stop Time  1716    OT Time Calculation (min)  56 min    Activity Tolerance  Patient tolerated treatment well    Behavior During Therapy  [facility] for tasks assessed/performed       Past Medical History:  Diagnosis Date  . BPH (benign prostatic hyperplasia)   . Extensor tendon laceration, finger, open wound, initial encounter    left thumb  . Hypertension   . Hypothyroidism   . Macrocytosis     Past Surgical History:  Procedure Laterality Date  . COLONOSCOPY WITH PROPOFOL N/A 12/18/2018   Procedure: COLONOSCOPY WITH BIOPSY;  Surgeon: Lucilla Lame, MD;  Location: [place];  Service: Endoscopy;  Laterality: N/A;  NEEDS TO STAY FIRST  . ESOPHAGOGASTRODUODENOSCOPY    . ESOPHAGOGASTRODUODENOSCOPY (EGD) WITH PROPOFOL N/A 02/23/2019   Procedure: ESOPHAGOGASTRODUODENOSCOPY (EGD) WITH PROPOFOL;  Surgeon: Lucilla Lame, MD;  Location: [facility] ENDOSCOPY;  Service: Endoscopy;  Laterality: N/A;  . HERNIA REPAIR    . LIPOMA EXCISION Right    upper arm  . POLYPECTOMY N/A 12/18/2018   Procedure: POLYPECTOMY;  Surgeon: Lucilla Lame, MD;  Location: [place];  Service: Endoscopy;  Laterality: N/A;  . TENDON REPAIR Left 11/05/2019   Procedure: LEFT THUMB TENDON REPAIR;  Surgeon: Cindra Presume, MD;  Location: [place];  Service: Plastics;  Laterality: Left;  Axillary block, MAC  . UMBILICAL HERNIA REPAIR       There were no vitals filed for this visit.  Subjective Assessment - 11/30/19 1753    Subjective   I had this burning tender pain over the scar when I try and move my thumb. Feels like blister - done my exercises 3 x with heat and 2 other times during day - scar tender    Patient Stated Goals  Need my hand to use in surgery for clap - need to be able to make pinch grip- and use my hand in yardwork, fishing    Currently in Pain?  Yes    Pain Score  5     Pain Location  Hand    Pain Orientation  Left    Pain Descriptors / Indicators  Burning;Tender;Tightness;Numbness;Tingling;Aching    Pain Type  Surgical pain    Pain Onset  1 to 4 weeks ago    Pain Frequency  Intermittent    Aggravating Factors   with AROM of thumb         [facility] OT Assessment - 11/30/19 0001      AROM   Right Wrist Extension  70 Degrees    Right Wrist Flexion  97 Degrees    Right Wrist Radial Deviation  12 Degrees    Right Wrist Ulnar Deviation  30 Degrees    Left Wrist Extension  68 Degrees    Left Wrist Flexion  73 Degrees    Left Wrist Radial Deviation  10 Degrees    Left Wrist Ulnar Deviation  30 Degrees   thumb out to side     Left Hand AROM   L Thumb MCP 0-60  40 Degrees   in session 48   L Thumb IP 0-80  20 Degrees   30 in session    L Thumb Palmar ADduction/ABduction 0-45  45    L Thumb Opposition to Index  --   2nd - pull to 3rd - 1 cm foam block for weekend start      Pt arrrive with splint on - did provide pt with some soft stockinet for under  splint and provided some extra moldskin in splint  Silicon sleeve for compression on thumb and cica scar pad for scar to use as much as can - but off am and pm for breathing   AROM assess for thumb and wrist - see flow sheet          OT Treatments/Exercises (OP) - 11/30/19 0001      LUE Contrast Bath   Time  9 minutes    Comments  --   prior to AROM of thumb and wrist     assess scar - healed but adhere and limiting pt AROM for flexion   And burning pain after few reps for flexion and extention at scar    Wrist flexion , extention , RD, UD done over armrest and add to HEP  3 x day 10 reps pain free and relax thumb      thumb at 45 degrees ABD - 2nd digit height and flex AROM IP of thumb and up into full extention  And same with MC flexion AROM and then full back in extention  pt to do 2 x 5 reps instead of 10 reps at one   To do 5 x day after contrast  And splint on in between  Precautions review and what to avoid   Scar assess and pt to do scar massage 3 x day after some heat     over weekend at 4 to 4 1/2 wks - pt can do opposition pick up 2 cm foam block alternate digits  And then to 1 cm foam block - over weekend if no pain with opposition to 5th pick up 2 cm foam block    OT Education - 11/30/19 1755    Education Details  precautions, HEP    Person(s) Educated  Patient    Methods  Explanation;Demonstration;Handout    Comprehension  Verbalized understanding;Returned demonstration       OT Short Term Goals - 11/17/19 0958      OT SHORT TERM GOAL #1   Title  Pt will Mod I splint use, care and precautions L thumb/wrist/forearm    Time  4    Period  Weeks    Status  New    Target Date  12/15/19      OT SHORT TERM GOAL #2   Title  Pt wil lbe Mod  I scar management L dorsal thumb    Time  4    Period  Weeks    Status  New    Target Date  12/15/19      OT SHORT TERM GOAL #3   Title  Pt will be Mod I HEP L thumb/wrist    Time  4    Period  Weeks    Status  New  Target Date  12/15/19        OT Long Term Goals - 11/17/19 0959      OT LONG TERM GOAL #1   Title  Pt will be Mod I upgraded HEP & scar management L thumb    Time  8    Period  Weeks    Status  New    Target Date  12/29/19      OT LONG TERM GOAL #2   Title  Pt will demonstrate functional flexion/A/ROM L thumb as seen by ability to oppose thumb to all digits 1-5 and to the base of small finger in preparation of holding surgical  tools    Time  8    Period  Weeks    Status  New    Target Date  12/29/19      OT LONG TERM GOAL #3   Title  Pt will demonstrate functional active extension, abduction and rotation L thumb as seen by goniometer measurement in preparation for return to work related tasks    Time  8    Period  Weeks    Status  New    Target Date  12/29/19      OT LONG TERM GOAL #4   Title  Pt will demonstrate sensibility WFL's L thumb as indicated by semmes weinstein monofilaments assessment of 2.83 and subjective reports of decreased paresthesias dorsally.    Time  8    Period  Weeks    Status  New    Target Date  12/29/19            Plan - 11/30/19 1756    Clinical Impression Statement  Pt is 3 1/2 wks s/p L thumb EPL/EPB repair on 11/05/2019 - pt scar healed by adhere and limiting AROM for flexion of thumb - tender over scar with some numbness still - pt can start AROM of thumb flexion MP , IP and combination and AROM to AAROm for thumb into extention - and AROM for wrist in all planes - pt to focus on scar massage    OT Occupational Profile and History  Problem Focused Assessment - Including review of records relating to presenting problem    Occupational performance deficits (Please refer to evaluation for details):  ADL's;IADL's;Work    Body Structure / Function / Physical Skills  ADL;Dexterity;ROM;Edema;Scar mobility;Sensation;Mobility;Skin integrity;Flexibility;Strength;Coordination;FMC;Pain;UE functional use    Rehab Potential  Good    Clinical Decision Making  Limited treatment options, no task modification necessary    Comorbidities Affecting Occupational Performance:  None    Modification or Assistance to Complete Evaluation   No modification of tasks or assist necessary to complete eval    OT Frequency  Other (comment)    OT Duration  6 weeks    OT Treatment/Interventions  Self-care/ADL training;Fluidtherapy;Splinting;Therapeutic activities;Ultrasound;Therapeutic exercise;Scar  mobilization;Cryotherapy;Passive range of motion;Manual Therapy;Patient/family education    Plan  assess HEP progress, tolerance , scar massage initiate, upgrade HEP to tolerance    Consulted and Agree with Plan of Care  Patient       Patient will benefit from skilled therapeutic intervention in order to improve the following deficits and impairments:   Body Structure / Function / Physical Skills: ADL, Dexterity, ROM, Edema, Scar mobility, Sensation, Mobility, Skin integrity, Flexibility, Strength, Coordination, FMC, Pain, UE functional use       Visit Diagnosis: Pain in joint of left hand  Other lack of coordination  Muscle weakness (generalized)  Localized edema    Problem List  There are no problems to display for this patient.   Rosalyn Gess OTR/L,CLT 11/30/2019, 5:58 PM  [facility] [address], [place], [address] Phone: [phone]   Fax:  [phone]  Name: Alexander Rojas MRN: [id number] Date of Birth: 1956/09/27

## 2019-12-08 ENCOUNTER — Other Ambulatory Visit: Payer: Self-pay

## 2019-12-08 ENCOUNTER — Ambulatory Visit: Payer: 59 | Admitting: Occupational Therapy

## 2019-12-08 DIAGNOSIS — R600 Localized edema: Secondary | ICD-10-CM

## 2019-12-08 DIAGNOSIS — M25542 Pain in joints of left hand: Secondary | ICD-10-CM | POA: Diagnosis not present

## 2019-12-08 DIAGNOSIS — M6281 Muscle weakness (generalized): Secondary | ICD-10-CM | POA: Diagnosis not present

## 2019-12-08 DIAGNOSIS — R278 Other lack of coordination: Secondary | ICD-10-CM

## 2019-12-08 NOTE — Therapy (Signed)
[facility] [address], [place], [address] Phone: [phone]   Fax:  [phone]  Occupational Therapy Treatment  Patient Details  Name: Alexander Rojas MRN: [id number] Date of Birth: 1956-02-26 Referring Provider (OT): Dr Steffanie Dunn. Pace   Encounter Date: 12/08/2019  OT End of Session - 12/08/19 1952    Visit Number  4    Number of Visits  12    Date for OT Re-Evaluation  01/12/20    Authorization Type  Pt is Alexander Rojas    OT Start Time  1630    OT Stop Time  1735    OT Time Calculation (min)  65 min    Activity Tolerance  Patient tolerated treatment well    Behavior During Therapy  [facility] for tasks assessed/performed       Past Medical History:  Diagnosis Date  . BPH (benign prostatic hyperplasia)   . Extensor tendon laceration, finger, open wound, initial encounter    left thumb  . Hypertension   . Hypothyroidism   . Macrocytosis     Past Surgical History:  Procedure Laterality Date  . COLONOSCOPY WITH PROPOFOL N/A 12/18/2018   Procedure: COLONOSCOPY WITH BIOPSY;  Surgeon: Lucilla Lame, MD;  Location: [facility];  Service: Endoscopy;  Laterality: N/A;  NEEDS TO STAY FIRST  . ESOPHAGOGASTRODUODENOSCOPY    . ESOPHAGOGASTRODUODENOSCOPY (EGD) WITH PROPOFOL N/A 02/23/2019   Procedure: ESOPHAGOGASTRODUODENOSCOPY (EGD) WITH PROPOFOL;  Surgeon: Lucilla Lame, MD;  Location: [facility] ENDOSCOPY;  Service: Endoscopy;  Laterality: N/A;  . HERNIA REPAIR    . LIPOMA EXCISION Right    upper arm  . POLYPECTOMY N/A 12/18/2018   Procedure: POLYPECTOMY;  Surgeon: Lucilla Lame, MD;  Location: [facility];  Service: Endoscopy;  Laterality: N/A;  . TENDON REPAIR Left 11/05/2019   Procedure: LEFT THUMB TENDON REPAIR;  Surgeon: Cindra Presume, MD;  Location: [facility];  Service: Plastics;  Laterality: Left;  Axillary block, MAC  . UMBILICAL HERNIA REPAIR       There were no vitals filed for this visit.  Subjective Assessment - 12/08/19 1945    Subjective   My thumb is so stiff - and then this one spot is still to tender - my splint bother me - I think my swelling went down - so when can I do surgery again    Patient Stated Goals  Need my hand to use in surgery for clap - need to be able to make pinch grip- and use my hand in yardwork, fishing    Currently in Pain?  Yes    Pain Score  8     Pain Location  Finger (Comment which one)    Pain Orientation  Left    Pain Descriptors / Indicators  Aching;Tender;Burning;Tightness    Pain Type  Surgical pain    Pain Onset  More than a month ago    Aggravating Factors   with scar massage and ROM         [facility] OT Assessment - 12/08/19 0001      AROM   Left Wrist Extension  70 Degrees    Left Wrist Flexion  90 Degrees    Left Wrist Radial Deviation  15 Degrees    Left Wrist Ulnar Deviation  30 Degrees   thumb ADD     Left Hand AROM   L Thumb MCP 0-60  45 Degrees  52 in session    L Thumb IP 0-80  25 Degrees   38 in session    L Thumb Radial ADduction/ABduction 0-55  60    L Thumb Palmar ADduction/ABduction 0-45  70    L Thumb Opposition to Index  --   Opposition to all digits      Pt AROM improve in wrist , thumb in all planes  cont to be tender over distal scars more than proximal - one spot at Z that tender - feels like stitch Splint causing some pressure base of thumb - edema decrease - modified and moldskin done and new straps   Scar assess and wife to assist pt to focus on scar massage to distal 2 - proximal one better - cica scar pad provided - new and larger one - wear silicon sleeve and coban at base          OT Treatments/Exercises (OP) - 12/08/19 0001      LUE Fluidotherapy   Number Minutes Fluidotherapy  10 Minutes    LUE Fluidotherapy Location  Hand;Wrist    Comments  AROM in all planes prior to ROM and scar tissue mobs         AROM for wrist flexion and  extention composite Thumb including  THumb PA and RA AROM  Blocked AROM for thumb IP and MC  Opposition to all digits and slide down 4th and 5th -  75% pull or effort for AROM  10 reps 3-5 x day  Heat prior and ice at the end  Scar massage done  and cica scar pad  cont with silicon digitsleeve with coban at base of thumb  Cont splint inbetween HEP  Heat prior and ice end       OT Education - 12/08/19 1952    Education Details  precautions, HEP    Person(s) Educated  Patient;Spouse    Methods  Explanation;Demonstration;Handout    Comprehension  Verbalized understanding;Returned demonstration       OT Short Term Goals - 11/17/19 0958      OT SHORT TERM GOAL #1   Title  Pt will Mod I splint use, care and precautions L thumb/wrist/forearm    Time  4    Period  Weeks    Status  New    Target Date  12/15/19      OT SHORT TERM GOAL #2   Title  Pt wil lbe Mod  I scar management L dorsal thumb    Time  4    Period  Weeks    Status  New    Target Date  12/15/19      OT SHORT TERM GOAL #3   Title  Pt will be Mod I HEP L thumb/wrist    Time  4    Period  Weeks    Status  New    Target Date  12/15/19        OT Long Term Goals - 11/17/19 0959      OT LONG TERM GOAL #1   Title  Pt will be Mod I upgraded HEP & scar management L thumb    Time  8    Period  Weeks    Status  New    Target Date  12/29/19      OT LONG TERM GOAL #2   Title  Pt will demonstrate functional flexion/A/ROM L thumb as seen by ability to oppose thumb to all digits 1-5 and to the base of  small finger in preparation of holding surgical tools    Time  8    Period  Weeks    Status  New    Target Date  12/29/19      OT LONG TERM GOAL #3   Title  Pt will demonstrate functional active extension, abduction and rotation L thumb as seen by goniometer measurement in preparation for return to work related tasks    Time  8    Period  Weeks    Status  New    Target Date  12/29/19      OT LONG TERM GOAL  #4   Title  Pt will demonstrate sensibility WFL's L thumb as indicated by semmes weinstein monofilaments assessment of 2.83 and subjective reports of decreased paresthesias dorsally.    Time  8    Period  Weeks    Status  New    Target Date  12/29/19            Plan - 12/08/19 1954    Clinical Impression Statement  Pt 4 1/2 wks s/p L thumb EPL/EPB repair on 11/05/2019- pt proximal scar adhesion improve but distal 2 tender and adhere - pain with scar massage and AROM flexion of IP and MC - pt imrpove ing in AROM -and add wrist and thumb in all planes    OT Occupational Profile and History  Problem Focused Assessment - Including review of records relating to presenting problem    Occupational performance deficits (Please refer to evaluation for details):  ADL's;IADL's;Work    Body Structure / Function / Physical Skills  ADL;Dexterity;ROM;Edema;Scar mobility;Sensation;Mobility;Skin integrity;Flexibility;Strength;Coordination;FMC;Pain;UE functional use    Rehab Potential  Good    Clinical Decision Making  Limited treatment options, no task modification necessary    Comorbidities Affecting Occupational Performance:  None    Modification or Assistance to Complete Evaluation   No modification of tasks or assist necessary to complete eval    OT Frequency  --   1-2 x wk   OT Duration  6 weeks    OT Treatment/Interventions  Self-care/ADL training;Fluidtherapy;Splinting;Therapeutic activities;Ultrasound;Therapeutic exercise;Scar mobilization;Cryotherapy;Passive range of motion;Manual Therapy;Patient/family education    Plan  assess HEP progress, tolerance , scar massage initiate, upgrade HEP to tolerance    Consulted and Agree with Plan of Care  Patient       Patient will benefit from skilled therapeutic intervention in order to improve the following deficits and impairments:   Body Structure / Function / Physical Skills: ADL, Dexterity, ROM, Edema, Scar mobility, Sensation, Mobility, Skin  integrity, Flexibility, Strength, Coordination, FMC, Pain, UE functional use       Visit Diagnosis: Pain in joint of left hand  Other lack of coordination  Muscle weakness (generalized)  Localized edema    Problem List There are no problems to display for this patient.   Rosalyn Gess OTR/l,CLT 12/08/2019, 8:00 PM  [facility] [address] [place], [address] Phone: [phone]   Fax:  [phone]  Name: Alexander Rojas MRN: [id number] Date of Birth: 1956/08/13

## 2019-12-08 NOTE — Patient Instructions (Signed)
AROM for wrist flexion and extention composite  Thumb including  THumb PA and RA AROM  Blocked AROM for thumb IP and MC  Opposition to all digits and slide down 4th and 5th -  75% pull or effort for AROM  Scar massage and cica scar pad cont with silicon digitsleeve with coban  Cont splint inbetween HEP  Heat prior and ice end

## 2019-12-14 ENCOUNTER — Ambulatory Visit: Payer: 59 | Admitting: Occupational Therapy

## 2019-12-14 ENCOUNTER — Other Ambulatory Visit: Payer: Self-pay

## 2019-12-14 DIAGNOSIS — R278 Other lack of coordination: Secondary | ICD-10-CM

## 2019-12-14 DIAGNOSIS — R600 Localized edema: Secondary | ICD-10-CM | POA: Diagnosis not present

## 2019-12-14 DIAGNOSIS — M25542 Pain in joints of left hand: Secondary | ICD-10-CM | POA: Diagnosis not present

## 2019-12-14 DIAGNOSIS — M6281 Muscle weakness (generalized): Secondary | ICD-10-CM | POA: Diagnosis not present

## 2019-12-14 NOTE — Patient Instructions (Signed)
Cont contrast or heat Prior to PROM   gentle PROM to IP and MP flexion -with full extention AROM IP and MP Gentle composite PROM of thumb  Full AROM extention of thumb  Thumb PA and RA AROM  Opposition to PIP of 5th sliding down PROM RD of wrist And AROM for wrist in all planes  kinesiotape for scars parallel to distal and proximal 30 % and across prox to distal 1 at 100% pull  Can use piece of coban for scar mobs- stay static and mobs scars

## 2019-12-14 NOTE — Therapy (Signed)
[facility] [address] [place], [address] Phone: [phone]   Fax:  [phone]  Occupational Therapy Treatment  Patient Details  Name: Alexander Rojas MRN: [id number] Date of Birth: May 22, 1956 Referring Provider (OT): Dr Steffanie Dunn. Pace   Encounter Date: 12/14/2019  OT End of Session - 12/14/19 1748    Visit Number  5    Number of Visits  12    Date for OT Re-Evaluation  01/12/20    Authorization Type  Pt is [place]    OT Start Time  6237    OT Stop Time  1655    OT Time Calculation (min)  60 min    Activity Tolerance  Patient tolerated treatment well    Behavior During Therapy  [facility] for tasks assessed/performed       Past Medical History:  Diagnosis Date  . BPH (benign prostatic hyperplasia)   . Extensor tendon laceration, finger, open wound, initial encounter    left thumb  . Hypertension   . Hypothyroidism   . Macrocytosis     Past Surgical History:  Procedure Laterality Date  . COLONOSCOPY WITH PROPOFOL N/A 12/18/2018   Procedure: COLONOSCOPY WITH BIOPSY;  Surgeon: Lucilla Lame, MD;  Location: [facility];  Service: Endoscopy;  Laterality: N/A;  NEEDS TO STAY FIRST  . ESOPHAGOGASTRODUODENOSCOPY    . ESOPHAGOGASTRODUODENOSCOPY (EGD) WITH PROPOFOL N/A 02/23/2019   Procedure: ESOPHAGOGASTRODUODENOSCOPY (EGD) WITH PROPOFOL;  Surgeon: Lucilla Lame, MD;  Location: [facility] ENDOSCOPY;  Service: Endoscopy;  Laterality: N/A;  . HERNIA REPAIR    . LIPOMA EXCISION Right    upper arm  . POLYPECTOMY N/A 12/18/2018   Procedure: POLYPECTOMY;  Surgeon: Lucilla Lame, MD;  Location: [facility];  Service: Endoscopy;  Laterality: N/A;  . TENDON REPAIR Left 11/05/2019   Procedure: LEFT THUMB TENDON REPAIR;  Surgeon: Cindra Presume, MD;  Location: [facility];  Service: Plastics;  Laterality: Left;  Axillary block, MAC  . UMBILICAL HERNIA REPAIR       There were no vitals filed for this visit.  Subjective Assessment - 12/14/19 1712    Subjective   I dttle bit when just sitting - about maybe 30 min 2 x day -but it is so tight over the scar when bending thumb down - and still numbness top of finger    Patient Stated Goals  Need my hand to use in surgery for clap - need to be able to make pinch grip- and use my hand in yardwork, fishing    Currently in Pain?  Yes    Pain Score  5     Pain Location  Finger (Comment which one)    Pain Orientation  Left    Pain Descriptors / Indicators  Aching;Tightness    Pain Type  Surgical pain    Pain Onset  More than a month ago    Pain Frequency  Intermittent    Aggravating Factors   thumb flexion at scar         [facility] OT Assessment - 12/14/19 0001      AROM   Left Wrist Extension  75 Degrees    Left Wrist Flexion  90 Degrees      Left Hand AROM   L Thumb MCP 0-60  50 Degrees   55 in session   L Thumb IP 0-80  30 Degrees   35 in session  L Thumb Opposition to Index  --   Opposition to distal fold and PIP in fluido      Pt making progress every time coming in - scar still adhere and stiffness in flexion - mostly IP and composite  Extention of IP and MP of thumb encourage          OT Treatments/Exercises (OP) - 12/14/19 0001      LUE Fluidotherapy   Number Minutes Fluidotherapy  8 Minutes    LUE Fluidotherapy Location  Hand;Wrist    Comments  AROM for thumb and wrist in all planes       scar massage done gentle with vibration , and coban ed on using to increase scar mobs - wife can use     gentle PROM to IP and MP flexion -with full extention AROM IP and MP Gentle composite PROM of thumb  Full AROM extention of thumb  Thumb PA and RA AROM  Opposition to PIP of 5th sliding down PROM RD of wrist 10 reps  And AROM for wrist in all planes  kinesiotape for scars parallel to distal and proximal 30 % and across prox to distal 1 at 100% pull  - to use daytime and night  time cica scar pad   ed on precautions  Can use piece of coban for scar mobs- stay static and mobs scars         OT Education - 12/14/19 1748    Education Details  HEP changes and HEP    Person(s) Educated  Patient;Spouse    Methods  Explanation;Demonstration;Handout    Comprehension  Verbalized understanding;Returned demonstration       OT Short Term Goals - 11/17/19 0958      OT SHORT TERM GOAL #1   Title  Pt will Mod I splint use, care and precautions L thumb/wrist/forearm    Time  4    Period  Weeks    Status  New    Target Date  12/15/19      OT SHORT TERM GOAL #2   Title  Pt wil lbe Mod  I scar management L dorsal thumb    Time  4    Period  Weeks    Status  New    Target Date  12/15/19      OT SHORT TERM GOAL #3   Title  Pt will be Mod I HEP L thumb/wrist    Time  4    Period  Weeks    Status  New    Target Date  12/15/19        OT Long Term Goals - 11/17/19 0959      OT LONG TERM GOAL #1   Title  Pt will be Mod I upgraded HEP & scar management L thumb    Time  8    Period  Weeks    Status  New    Target Date  12/29/19      OT LONG TERM GOAL #2   Title  Pt will demonstrate functional flexion/A/ROM L thumb as seen by ability to oppose thumb to all digits 1-5 and to the base of small finger in preparation of holding surgical tools    Time  8    Period  Weeks    Status  New    Target Date  12/29/19      OT LONG TERM GOAL #3   Title  Pt will demonstrate functional active extension, abduction and rotation L thumb  as seen by goniometer measurement in preparation for return to work related tasks    Time  8    Period  Weeks    Status  New    Target Date  12/29/19      OT LONG TERM GOAL #4   Title  Pt will demonstrate sensibility WFL's L thumb as indicated by semmes weinstein monofilaments assessment of 2.83 and subjective reports of decreased paresthesias dorsally.    Time  8    Period  Weeks    Status  New    Target Date  12/29/19             Plan - 12/14/19 1749    Clinical Impression Statement  Pt is 5 1/2 wks s/p L thumb EPL/EPB repair on 11/05/2019 - pt scar adhesions limiting flexion and end range IP hyper extention - wrist doing very well - did start some gentle PROM to flexion with AROM extention of thumb IP and MP -    OT Occupational Profile and History  Problem Focused Assessment - Including review of records relating to presenting problem    Occupational performance deficits (Please refer to evaluation for details):  ADL's;IADL's;Work    Body Structure / Function / Physical Skills  ADL;Dexterity;ROM;Edema;Scar mobility;Sensation;Mobility;Skin integrity;Flexibility;Strength;Coordination;FMC;Pain;UE functional use    Rehab Potential  Good    Clinical Decision Making  Limited treatment options, no task modification necessary    Comorbidities Affecting Occupational Performance:  None    Modification or Assistance to Complete Evaluation   No modification of tasks or assist necessary to complete eval    OT Frequency  2x / week    OT Duration  6 weeks    OT Treatment/Interventions  Self-care/ADL training;Fluidtherapy;Splinting;Therapeutic activities;Ultrasound;Therapeutic exercise;Scar mobilization;Cryotherapy;Passive range of motion;Manual Therapy;Patient/family education    Plan  assess HEP progress, tolerance , scar massage initiate, upgrade HEP to tolerance    OT Home Exercise Plan  see pt instruction    Consulted and Agree with Plan of Care  Patient       Patient will benefit from skilled therapeutic intervention in order to improve the following deficits and impairments:   Body Structure / Function / Physical Skills: ADL, Dexterity, ROM, Edema, Scar mobility, Sensation, Mobility, Skin integrity, Flexibility, Strength, Coordination, FMC, Pain, UE functional use       Visit Diagnosis: Pain in joint of left hand  Other lack of coordination  Muscle weakness (generalized)  Localized edema    Problem  List There are no problems to display for this patient.   Rosalyn Gess OTR/L,CLT 12/14/2019, 5:52 PM  [facility] [address] [place], [address] Phone: [phone]   Fax:  [phone]  Name: TRUSTIN CHAPA MRN: [id number] Date of Birth: [birth year]

## 2019-12-17 ENCOUNTER — Other Ambulatory Visit: Payer: Self-pay

## 2019-12-17 ENCOUNTER — Ambulatory Visit: Payer: 59 | Admitting: Occupational Therapy

## 2019-12-17 DIAGNOSIS — R278 Other lack of coordination: Secondary | ICD-10-CM | POA: Diagnosis not present

## 2019-12-17 DIAGNOSIS — M6281 Muscle weakness (generalized): Secondary | ICD-10-CM | POA: Diagnosis not present

## 2019-12-17 DIAGNOSIS — R600 Localized edema: Secondary | ICD-10-CM | POA: Diagnosis not present

## 2019-12-17 DIAGNOSIS — M25542 Pain in joints of left hand: Secondary | ICD-10-CM | POA: Diagnosis not present

## 2019-12-17 NOTE — Therapy (Signed)
[facility] [address], [place], [address] Phone: [phone]   Fax:  [phone]  Occupational Therapy Treatment  Patient Details  Name: Alexander Rojas MRN: [id number] Date of Birth: [birth year] Referring Provider (OT): Dr Steffanie Dunn. Pace   Encounter Date: 12/17/2019  OT End of Session - 12/17/19 1350    Visit Number  6    Number of Visits  12    Date for OT Re-Evaluation  01/12/20    Authorization Type  Pt is Alexander Rojas    OT Start Time  [phone]    OT Stop Time  0830    OT Time Calculation (min)  51 min    Behavior During Therapy  [facility] for tasks assessed/performed       Past Medical History:  Diagnosis Date  . BPH (benign prostatic hyperplasia)   . Extensor tendon laceration, finger, open wound, initial encounter    left thumb  . Hypertension   . Hypothyroidism   . Macrocytosis     Past Surgical History:  Procedure Laterality Date  . COLONOSCOPY WITH PROPOFOL N/A 12/18/2018   Procedure: COLONOSCOPY WITH BIOPSY;  Surgeon: Lucilla Lame, MD;  Location: [place];  Service: Endoscopy;  Laterality: N/A;  NEEDS TO STAY FIRST  . ESOPHAGOGASTRODUODENOSCOPY    . ESOPHAGOGASTRODUODENOSCOPY (EGD) WITH PROPOFOL N/A 02/23/2019   Procedure: ESOPHAGOGASTRODUODENOSCOPY (EGD) WITH PROPOFOL;  Surgeon: Lucilla Lame, MD;  Location: [facility] ENDOSCOPY;  Service: Endoscopy;  Laterality: N/A;  . HERNIA REPAIR    . LIPOMA EXCISION Right    upper arm  . POLYPECTOMY N/A 12/18/2018   Procedure: POLYPECTOMY;  Surgeon: Lucilla Lame, MD;  Location: [place];  Service: Endoscopy;  Laterality: N/A;  . TENDON REPAIR Left 11/05/2019   Procedure: LEFT THUMB TENDON REPAIR;  Surgeon: Cindra Presume, MD;  Location: [place];  Service: Plastics;  Laterality: Left;  Axillary block, MAC  . UMBILICAL HERNIA REPAIR      There were no vitals filed for this  visit.  Subjective Assessment - 12/17/19 1345    Subjective   My thumb is just so stiff - I cannot bend it and tight over the knuckle - looks like the skin is going to crack open - did do the tape for 2 days - and then swelling is below the coban - it works only on my thumb self    Patient Stated Goals  Need my hand to use in surgery for clap - need to be able to make pinch grip- and use my hand in yardwork, fishing    Currently in Pain?  Yes    Pain Score  3     Pain Location  --   thumb   Pain Orientation  Left    Pain Descriptors / Indicators  Aching;Tightness;Burning    Pain Type  Surgical pain;Neuropathic pain    Pain Onset  More than a month ago    Pain Frequency  Intermittent    Aggravating Factors   PROM  of thumb over scar         [facility] OT Assessment - 12/17/19 0001      Left Hand AROM   L Thumb MCP 0-60  60 Degrees    L Thumb IP 0-80  35 Degrees   45 in session   L Thumb Opposition to Index  --   Opposition to middle phalanges and 2nd fold  in session      pt cont to show increase AROM at thumb IP and MC every session - limited by scar tissue adhesions and stiffness - in protective splint still  Can start taking it off today hour a day -but not tight and sustained grip and no heavy objects           OT Treatments/Exercises (OP) - 12/17/19 0001      LUE Fluidotherapy   Number Minutes Fluidotherapy  8 Minutes    LUE Fluidotherapy Location  Hand;Wrist    Comments  AROM for thumb in all planes        scar massage done gentle with vibration , and use of coban for scar massage by wife    gentle prolonged flexion  PROM to IP  This date 30 sec  X 4 at 40 and then at 45 degrees  AROM of MP flexion -with full extention AROM IP and MP in between to check   Gentle composite PROM of thumb with and without blocking of MC - to encourage more IP flexion during composite flexion of thumb  To finger tips and sliding down 4th and 5th  Full AROM extention of thumb  Thumb  PA and RA AROM   PROM RD of wrist 10 reps to cont with  And AROM for wrist in all planes  kinesiotape for scars parallel to distal and proximal 30 % and across prox to distal 1 at 100% pull  - to cont with - to use daytime and night time cica scar pad   ed on precautions  Can use piece of coban for scar mobs- stay static and mobs scars  Fitted with isotoner glove for compression - to get edema in base of thumb        OT Education - 12/17/19 1350    Education Details  HEP changes and HEP    Person(s) Educated  Patient    Methods  Explanation;Demonstration;Handout    Comprehension  Verbalized understanding;Returned demonstration       OT Short Term Goals - 11/17/19 0958      OT SHORT TERM GOAL #1   Title  Pt will Mod I splint use, care and precautions L thumb/wrist/forearm    Time  4    Period  Weeks    Status  New    Target Date  12/15/19      OT SHORT TERM GOAL #2   Title  Pt wil lbe Mod  I scar management L dorsal thumb    Time  4    Period  Weeks    Status  New    Target Date  12/15/19      OT SHORT TERM GOAL #3   Title  Pt will be Mod I HEP L thumb/wrist    Time  4    Period  Weeks    Status  New    Target Date  12/15/19        OT Long Term Goals - 11/17/19 0959      OT LONG TERM GOAL #1   Title  Pt will be Mod I upgraded HEP & scar management L thumb    Time  8    Period  Weeks    Status  New    Target Date  12/29/19      OT LONG TERM GOAL #2   Title  Pt will demonstrate functional flexion/A/ROM L thumb as seen by ability to oppose thumb to all  digits 1-5 and to the base of small finger in preparation of holding surgical tools    Time  8    Period  Weeks    Status  New    Target Date  12/29/19      OT LONG TERM GOAL #3   Title  Pt will demonstrate functional active extension, abduction and rotation L thumb as seen by goniometer measurement in preparation for return to work related tasks    Time  8    Period  Weeks    Status  New    Target Date   12/29/19      OT LONG TERM GOAL #4   Title  Pt will demonstrate sensibility WFL's L thumb as indicated by semmes weinstein monofilaments assessment of 2.83 and subjective reports of decreased paresthesias dorsally.    Time  8    Period  Weeks    Status  New    Target Date  12/29/19            Plan - 12/17/19 1351    Clinical Impression Statement  Pt is 6 wks s/p L thumb EPL/EPB repair on 11/05/2019 - pt limited in flexion of thumb adn end range hyper extention of thumb IP -  by scar tissue , pain slowly improving - pt show steady progress in flexion of thumb while maintaining extention - pt close to [facility] flexion WNL - but would need to be block during composite flexoin to inrease IP flexion of thumb    OT Occupational Profile and History  Problem Focused Assessment - Including review of records relating to presenting problem    Occupational performance deficits (Please refer to evaluation for details):  ADL's;IADL's;Work    Body Structure / Function / Physical Skills  ADL;Dexterity;ROM;Edema;Scar mobility;Sensation;Mobility;Skin integrity;Flexibility;Strength;Coordination;FMC;Pain;UE functional use    Rehab Potential  Good    Clinical Decision Making  Limited treatment options, no task modification necessary    Comorbidities Affecting Occupational Performance:  None    Modification or Assistance to Complete Evaluation   No modification of tasks or assist necessary to complete eval    OT Frequency  2x / week    OT Duration  6 weeks    OT Treatment/Interventions  Self-care/ADL training;Fluidtherapy;Splinting;Therapeutic activities;Ultrasound;Therapeutic exercise;Scar mobilization;Cryotherapy;Passive range of motion;Manual Therapy;Patient/family education    Plan  assess HEP progress, tolerance , scar massage initiate, upgrade HEP to tolerance    OT Home Exercise Plan  see pt instruction    Consulted and Agree with Plan of Care  Patient       Patient will benefit from skilled therapeutic  intervention in order to improve the following deficits and impairments:   Body Structure / Function / Physical Skills: ADL, Dexterity, ROM, Edema, Scar mobility, Sensation, Mobility, Skin integrity, Flexibility, Strength, Coordination, FMC, Pain, UE functional use       Visit Diagnosis: Pain in joint of left hand  Other lack of coordination  Muscle weakness (generalized)  Localized edema    Problem List There are no problems to display for this patient.   Rosalyn Gess OTR/L,CLT 12/17/2019, 1:57 PM  [place] [address] [place], [address] Phone: [phone]   Fax:  [phone]  Name: DICKY BOER MRN: [id number] Date of Birth: Nov 23, 1955

## 2019-12-17 NOTE — Patient Instructions (Signed)
ADD PROM of IP with blocked MC - and then opposition to digits with blocking of MC with sliding down digits  isotoner glove instead of coban wrap for compression over base of thumb and thenar eminence

## 2019-12-20 ENCOUNTER — Ambulatory Visit: Payer: 59 | Admitting: Occupational Therapy

## 2019-12-21 ENCOUNTER — Other Ambulatory Visit: Payer: Self-pay

## 2019-12-21 ENCOUNTER — Ambulatory Visit: Payer: 59 | Attending: Plastic Surgery | Admitting: Occupational Therapy

## 2019-12-21 DIAGNOSIS — R600 Localized edema: Secondary | ICD-10-CM | POA: Insufficient documentation

## 2019-12-21 DIAGNOSIS — R278 Other lack of coordination: Secondary | ICD-10-CM | POA: Insufficient documentation

## 2019-12-21 DIAGNOSIS — M25542 Pain in joints of left hand: Secondary | ICD-10-CM | POA: Insufficient documentation

## 2019-12-21 DIAGNOSIS — M6281 Muscle weakness (generalized): Secondary | ICD-10-CM | POA: Diagnosis not present

## 2019-12-21 NOTE — Patient Instructions (Signed)
Same but add PROM composite flexion of thumb to base of 5th  And dexterity 3 small objects from palm to  2 point grip -release  And 30 sec to 1 min  And rolling pen between 2nd and thumb tips - for IP extention of thumb

## 2019-12-21 NOTE — Therapy (Signed)
[place] [facility] [address], [place], [address] Phone: [phone]   Fax:  [phone]  Occupational Therapy Treatment  Patient Details  Name: Alexander Rojas MRN: [id number] Date of Birth: 1956/03/08 Referring Provider (OT): Dr Steffanie Dunn. Pace   Encounter Date: 12/21/2019  OT End of Session - 12/21/19 1708    Visit Number  7    Number of Visits  12    Date for OT Re-Evaluation  01/12/20    OT Start Time  1605    OT Stop Time  1703    OT Time Calculation (min)  58 min    Activity Tolerance  Patient tolerated treatment well    Behavior During Therapy  WFL for tasks assessed/performed       Past Medical History:  Diagnosis Date  . BPH (benign prostatic hyperplasia)   . Extensor tendon laceration, finger, open wound, initial encounter    left thumb  . Hypertension   . Hypothyroidism   . Macrocytosis     Past Surgical History:  Procedure Laterality Date  . COLONOSCOPY WITH PROPOFOL N/A 12/18/2018   Procedure: COLONOSCOPY WITH BIOPSY;  Surgeon: Lucilla Lame, MD;  Location: [place];  Service: Endoscopy;  Laterality: N/A;  NEEDS TO STAY FIRST  . ESOPHAGOGASTRODUODENOSCOPY    . ESOPHAGOGASTRODUODENOSCOPY (EGD) WITH PROPOFOL N/A 02/23/2019   Procedure: ESOPHAGOGASTRODUODENOSCOPY (EGD) WITH PROPOFOL;  Surgeon: Lucilla Lame, MD;  Location: [facility] ENDOSCOPY;  Service: Endoscopy;  Laterality: N/A;  . HERNIA REPAIR    . LIPOMA EXCISION Right    upper arm  . POLYPECTOMY N/A 12/18/2018   Procedure: POLYPECTOMY;  Surgeon: Lucilla Lame, MD;  Location: [place];  Service: Endoscopy;  Laterality: N/A;  . TENDON REPAIR Left 11/05/2019   Procedure: LEFT THUMB TENDON REPAIR;  Surgeon: Cindra Presume, MD;  Location: [place];  Service: Plastics;  Laterality: Left;  Axillary block, MAC  . UMBILICAL HERNIA REPAIR      There were no vitals filed for this visit.  Subjective Assessment -  12/21/19 1706    Subjective   I kept my splint off for about 4 hrs at time -but mostly when I am sitting still - I can tell it is not as stiff as it use to be - I feel better    Patient Stated Goals  Need my hand to use in surgery for clap - need to be able to make pinch grip- and use my hand in yardwork, fishing    Currently in Pain?  Yes    Pain Score  3     Pain Location  Hand    Pain Orientation  Left    Pain Descriptors / Indicators  Tender;Tightness;Aching    Pain Type  Surgical pain    Pain Onset  More than a month ago    Pain Frequency  Intermittent    Aggravating Factors   over scar with PROM flexion         [facility] OT Assessment - 12/21/19 0001      Left Hand AROM   L Thumb MCP 0-60  60 Degrees    L Thumb IP 0-80  40 Degrees   50 P and A in session    L Thumb Opposition to Index  --   in session to base of 5th      Pt cont to make progress in AROM for thumb   Pt to sleep in protective splint -but  can wean in neoprene soft CMC splint 2 hrs on and off during day at work - and gradually increase hour out of splint -at the moment at 4 hrs off when sitting still     Cannot do tight and sustained grip and no heavy objects  - only light ADl's     OT Treatments/Exercises (OP) - 12/21/19 0001      LUE Fluidotherapy   Number Minutes Fluidotherapy  10 Minutes    LUE Fluidotherapy Location  Hand;Wrist    Comments  AROM for thumb in all planes        scar massage done gentle with vibration , and use of coban for scar massage by wife but thumb in [place] flexion to hold during scar massage Did use xtractor with thumb ext and flexion this date   gentle prolonged flexion  PROM to IP  This date 30 sec  X 6  At 50  degrees  AROM block IP thumb 50  AROM of MP flexion -with full extention AROM IP and MP in between to check   Gentle composite PROM of thumb with and without blocking of MC to base of 5th   to encourage more IP flexion during composite flexion of thumb  Followed  by opposition and  sliding down 4th and 5th  Full AROM extention of thumb inbetween Thumb PA and RA AROM   Add kinesiotape for scars parallel to distal and proximal 30 % and across prox to distal 1 at 100% pull- to cont with - to use daytime and night time cica scar pad  ed on precautions  Can use piece of coban for scar mobs- stay static and mobs scars   dexterity 3 small objects from palm to  2 point grip -release  And 30 sec to 1 min  And rolling pen between 2nd and thumb tips - for IP extention of thum       OT Education - 12/21/19 1708    Education Details  HEP changes and progress    Person(s) Educated  Patient    Methods  Explanation;Demonstration;Handout    Comprehension  Verbalized understanding;Returned demonstration       OT Short Term Goals - 11/17/19 0958      OT SHORT TERM GOAL #1   Title  Pt will Mod I splint use, care and precautions L thumb/wrist/forearm    Time  4    Period  Weeks    Status  New    Target Date  12/15/19      OT SHORT TERM GOAL #2   Title  Pt wil lbe Mod  I scar management L dorsal thumb    Time  4    Period  Weeks    Status  New    Target Date  12/15/19      OT SHORT TERM GOAL #3   Title  Pt will be Mod I HEP L thumb/wrist    Time  4    Period  Weeks    Status  New    Target Date  12/15/19        OT Long Term Goals - 11/17/19 0959      OT LONG TERM GOAL #1   Title  Pt will be Mod I upgraded HEP & scar management L thumb    Time  8    Period  Weeks    Status  New    Target Date  12/29/19      OT LONG  TERM GOAL #2   Title  Pt will demonstrate functional flexion/A/ROM L thumb as seen by ability to oppose thumb to all digits 1-5 and to the base of small finger in preparation of holding surgical tools    Time  8    Period  Weeks    Status  New    Target Date  12/29/19      OT LONG TERM GOAL #3   Title  Pt will demonstrate functional active extension, abduction and rotation L thumb as seen by goniometer  measurement in preparation for return to work related tasks    Time  8    Period  Weeks    Status  New    Target Date  12/29/19      OT LONG TERM GOAL #4   Title  Pt will demonstrate sensibility WFL's L thumb as indicated by semmes weinstein monofilaments assessment of 2.83 and subjective reports of decreased paresthesias dorsally.    Time  8    Period  Weeks    Status  New    Target Date  12/29/19            Plan - 12/21/19 1709    Clinical Impression Statement  Pt is 6 1/2 wks s/p L thumb EPL/EPB repair on 11/05/2019 - pt cont to show progress in thumb flexion  , opposition - scar adhesions still limiting his flexion and hyper extention - did add some dexterity to hand -and can start using in light ADL's but no pressure- and still wean out of splint hour a day longer - at the moment at 4 hrs    OT Occupational Profile and History  Problem Focused Assessment - Including review of records relating to presenting problem    Occupational performance deficits (Please refer to evaluation for details):  ADL's;IADL's;Work    Body Structure / Function / Physical Skills  ADL;Dexterity;ROM;Edema;Scar mobility;Sensation;Mobility;Skin integrity;Flexibility;Strength;Coordination;FMC;Pain;UE functional use    Rehab Potential  Good    Clinical Decision Making  Limited treatment options, no task modification necessary    Comorbidities Affecting Occupational Performance:  None    Modification or Assistance to Complete Evaluation   No modification of tasks or assist necessary to complete eval    OT Frequency  2x / week    OT Duration  6 weeks    OT Treatment/Interventions  Self-care/ADL training;Fluidtherapy;Splinting;Therapeutic activities;Ultrasound;Therapeutic exercise;Scar mobilization;Cryotherapy;Passive range of motion;Manual Therapy;Patient/family education    Plan  assess HEP progress, tolerance , scar massage initiate, upgrade HEP to tolerance    OT Home Exercise Plan  see pt instruction     Consulted and Agree with Plan of Care  Patient       Patient will benefit from skilled therapeutic intervention in order to improve the following deficits and impairments:   Body Structure / Function / Physical Skills: ADL, Dexterity, ROM, Edema, Scar mobility, Sensation, Mobility, Skin integrity, Flexibility, Strength, Coordination, FMC, Pain, UE functional use       Visit Diagnosis: Pain in joint of left hand  Other lack of coordination  Muscle weakness (generalized)  Localized edema    Problem List There are no problems to display for this patient.   Rosalyn Gess OTR/l,CLT 12/21/2019, 5:12 PM  [place] [facility] [address] [place], [address] Phone: [phone]   Fax:  [phone]  Name: Alexander Rojas MRN: [id number] Date of Birth: 1956-08-10

## 2019-12-24 ENCOUNTER — Ambulatory Visit: Payer: 59 | Admitting: Occupational Therapy

## 2019-12-24 ENCOUNTER — Other Ambulatory Visit: Payer: Self-pay

## 2019-12-24 DIAGNOSIS — R600 Localized edema: Secondary | ICD-10-CM | POA: Diagnosis not present

## 2019-12-24 DIAGNOSIS — M25542 Pain in joints of left hand: Secondary | ICD-10-CM

## 2019-12-24 DIAGNOSIS — R278 Other lack of coordination: Secondary | ICD-10-CM | POA: Diagnosis not present

## 2019-12-24 DIAGNOSIS — M6281 Muscle weakness (generalized): Secondary | ICD-10-CM

## 2019-12-24 NOTE — Patient Instructions (Signed)
Add to HEP light blue putty - for gripping 12 x 2 sets -2 day  Lat and 3 point pinch - 12 reps - 2 x day  Isometric for thumb in all PA, RA - 12 reps - 2 sets - 2 x day 1 lbs weight for wrist in all planes - 2 x 12 reps  Increase another set Sunday  Pain free  Can do still some dexterity for thumb - in hand manipulation - 2 golf ball and 1 golf ball - but 30 sec at time  Several times during day And gradually decrease weariing of splint during day  Can use in light ADL's but no static grip or heavy activities  Sleep in splint until next week

## 2019-12-24 NOTE — Therapy (Signed)
[place] [facility] [address], [place], [address] Phone: [phone]   Fax:  [phone]  Occupational Therapy Treatment  Patient Details  Name: Alexander Rojas MRN: [id number] Date of Birth: 11-10-1955 Referring Provider (OT): Dr Steffanie Dunn. Pace   Encounter Date: 12/24/2019  OT End of Session - 12/24/19 0858    Visit Number  8    Number of Visits  12    Date for OT Re-Evaluation  01/12/20    Authorization Type  Pt is [place]    OT Start Time  0740    OT Stop Time  [phone]    OT Time Calculation (min)  61 min    Activity Tolerance  Patient tolerated treatment well    Behavior During Therapy  [facility] for tasks assessed/performed       Past Medical History:  Diagnosis Date  . BPH (benign prostatic hyperplasia)   . Extensor tendon laceration, finger, open wound, initial encounter    left thumb  . Hypertension   . Hypothyroidism   . Macrocytosis     Past Surgical History:  Procedure Laterality Date  . COLONOSCOPY WITH PROPOFOL N/A 12/18/2018   Procedure: COLONOSCOPY WITH BIOPSY;  Surgeon: Lucilla Lame, MD;  Location: [place];  Service: Endoscopy;  Laterality: N/A;  NEEDS TO STAY FIRST  . ESOPHAGOGASTRODUODENOSCOPY    . ESOPHAGOGASTRODUODENOSCOPY (EGD) WITH PROPOFOL N/A 02/23/2019   Procedure: ESOPHAGOGASTRODUODENOSCOPY (EGD) WITH PROPOFOL;  Surgeon: Lucilla Lame, MD;  Location: [facility] ENDOSCOPY;  Service: Endoscopy;  Laterality: N/A;  . HERNIA REPAIR    . LIPOMA EXCISION Right    upper arm  . POLYPECTOMY N/A 12/18/2018   Procedure: POLYPECTOMY;  Surgeon: Lucilla Lame, MD;  Location: [place];  Service: Endoscopy;  Laterality: N/A;  . TENDON REPAIR Left 11/05/2019   Procedure: LEFT THUMB TENDON REPAIR;  Surgeon: Cindra Presume, MD;  Location: [place];  Service: Plastics;  Laterality: Left;  Axillary block, MAC  . UMBILICAL HERNIA REPAIR       There were no vitals filed for this visit.  Subjective Assessment - 12/24/19 0852    Subjective   Doing okay - my hard splint to large - swelling went down -and the black one get so tight around the thumb - coban works the best -can tell the numbness is getting better    Patient Stated Goals  Need my hand to use in surgery for clap - need to be able to make pinch grip- and use my hand in yardwork, fishing    Currently in Pain?  Yes    Pain Score  3     Pain Location  Hand    Pain Orientation  Left    Pain Descriptors / Indicators  Tender;Tightness;Aching    Pain Type  Surgical pain    Pain Onset  More than a month ago    Pain Frequency  Intermittent    Aggravating Factors   over scar and IP with PROM        IP thumb flexion PROM 50 ,AROM 45 to 50 this date , MC 4  Opposition in session to base of 5th  After fluido  Report using it little more in light activities ADL's  But cannot pinch to pull on socks or pants   Wearing hard splint some and soft neoprene to tight around thumb - so on and off with that one  Still sleeping with hard splint             OT Treatments/Exercises (OP) - 12/24/19 0001      LUE Fluidotherapy   Number Minutes Fluidotherapy  10 Minutes    LUE Fluidotherapy Location  Hand;Wrist    Comments  AROM for thumb in all planes      scar massage done gentle with vibration , anduse ofcobanfor scar massage by wife but thumb in [place] flexion to hold during scar massage Did use xtractor with thumb ext and flexion this date   gentleprolonged flexionPROM to IP This date 30 sec  X 6  At 50  degrees  AROM block IP thumb 50  AROM ofMP flexion -with full extention AROM IP and MPin between to check  Gentle composite PROM of thumbwith and without blocking of MC to base of 5th   to encourage more IP flexion during composite flexion of thumb  Followed by opposition and  sliding down 4th and 5th  Full AROM extention of thumb inbetween Thumb PA  and RA AROM   Add kinesiotape for scars  But in X pattern - pt took picture for wife to do  100% pull-to cont with -to use daytime and night time cica scar pad  Tape off for HEP  ed on precautions     light blue putty add to HEP for  - for gripping 12 x 2 sets -2 day  Lat and 3 point pinch - 12 reps - 2 x day  Look at other thumb movement - and copy   Isometric for thumb in all PA, RA - 12 reps - 2 sets - 2 x day  1 lbs weight for wrist in all planes - 2 x 12 reps   Increase another set Sunday on all  Pain free   Can cont with dexterity for thumb -add  in hand manipulation - 2 golf ball and 1 golf ball - but 30 sec at time  Several times during day And gradually decrease weariing of splint during day  Can use in light ADL's but no static grip or heavy activities  Sleep in splint until next week        OT Education - 12/24/19 0858    Education Details  HEP changes and progress    Person(s) Educated  Patient    Methods  Explanation;Demonstration;Handout    Comprehension  Verbalized understanding;Returned demonstration       OT Short Term Goals - 11/17/19 0958      OT SHORT TERM GOAL #1   Title  Pt will Mod I splint use, care and precautions L thumb/wrist/forearm    Time  4    Period  Weeks    Status  New    Target Date  12/15/19      OT SHORT TERM GOAL #2   Title  Pt wil lbe Mod  I scar management L dorsal thumb    Time  4    Period  Weeks    Status  New    Target Date  12/15/19      OT SHORT TERM GOAL #3   Title  Pt will be Mod I HEP L thumb/wrist    Time  4    Period  Weeks    Status  New    Target Date  12/15/19        OT Long Term Goals - 11/17/19 0959      OT LONG TERM GOAL #1  Title  Pt will be Mod I upgraded HEP & scar management L thumb    Time  8    Period  Weeks    Status  New    Target Date  12/29/19      OT LONG TERM GOAL #2   Title  Pt will demonstrate functional flexion/A/ROM L thumb as seen by ability to oppose thumb to all  digits 1-5 and to the base of small finger in preparation of holding surgical tools    Time  8    Period  Weeks    Status  New    Target Date  12/29/19      OT LONG TERM GOAL #3   Title  Pt will demonstrate functional active extension, abduction and rotation L thumb as seen by goniometer measurement in preparation for return to work related tasks    Time  8    Period  Weeks    Status  New    Target Date  12/29/19      OT LONG TERM GOAL #4   Title  Pt will demonstrate sensibility WFL's L thumb as indicated by semmes weinstein monofilaments assessment of 2.83 and subjective reports of decreased paresthesias dorsally.    Time  8    Period  Weeks    Status  New    Target Date  12/29/19            Plan - 12/24/19 0858    Clinical Impression Statement  Pt is 7 wks s/p L thumb EPL /EPB repair  pt show increase functional use of L thumb in light ADL's - flexion of thumb to base of 5th after heat and PROM - scar still adhere - initiate strenghening this date for grip , prehension and PA/RA , wrist in all planes - pain free -and gradaully increase sets - monitor extensor lag    OT Occupational Profile and History  Problem Focused Assessment - Including review of records relating to presenting problem    Occupational performance deficits (Please refer to evaluation for details):  ADL's;IADL's;Work    Body Structure / Function / Physical Skills  ADL;Dexterity;ROM;Edema;Scar mobility;Sensation;Mobility;Skin integrity;Flexibility;Strength;Coordination;FMC;Pain;UE functional use    Rehab Potential  Good    Clinical Decision Making  Limited treatment options, no task modification necessary    Comorbidities Affecting Occupational Performance:  None    Modification or Assistance to Complete Evaluation   No modification of tasks or assist necessary to complete eval    OT Frequency  2x / week    OT Duration  4 weeks    OT Treatment/Interventions  Self-care/ADL  training;Fluidtherapy;Splinting;Therapeutic activities;Ultrasound;Therapeutic exercise;Scar mobilization;Cryotherapy;Passive range of motion;Manual Therapy;Patient/family education    Plan  assess HEP progress, tolerance , scar massage initiate, upgrade HEP to tolerance    OT Home Exercise Plan  see pt instruction    Consulted and Agree with Plan of Care  Patient       Patient will benefit from skilled therapeutic intervention in order to improve the following deficits and impairments:   Body Structure / Function / Physical Skills: ADL, Dexterity, ROM, Edema, Scar mobility, Sensation, Mobility, Skin integrity, Flexibility, Strength, Coordination, FMC, Pain, UE functional use       Visit Diagnosis: Pain in joint of left hand  Other lack of coordination  Muscle weakness (generalized)  Localized edema    Problem List There are no problems to display for this patient.   Rosalyn Gess OTR/L,CLT 12/24/2019, 9:02 AM  [facility] [address], [place], [address] Phone: [phone]   Fax:  [phone]  Name: BENNEY SOMMERVILLE MRN: [id number] Date of Birth: 1956-02-28

## 2019-12-28 ENCOUNTER — Ambulatory Visit: Payer: 59 | Admitting: Occupational Therapy

## 2019-12-28 ENCOUNTER — Other Ambulatory Visit: Payer: Self-pay

## 2019-12-28 DIAGNOSIS — R278 Other lack of coordination: Secondary | ICD-10-CM | POA: Diagnosis not present

## 2019-12-28 DIAGNOSIS — M25542 Pain in joints of left hand: Secondary | ICD-10-CM

## 2019-12-28 DIAGNOSIS — R600 Localized edema: Secondary | ICD-10-CM

## 2019-12-28 DIAGNOSIS — M6281 Muscle weakness (generalized): Secondary | ICD-10-CM

## 2019-12-28 NOTE — Therapy (Signed)
[place] [facility] [address], [place], [address] Phone: [phone]   Fax:  [phone]  Occupational Therapy Treatment  Patient Details  Name: Alexander Rojas MRN: [id number] Date of Birth: [birth year] Referring Provider (OT): Dr Steffanie Dunn. [place]ace   Encounter Date: 12/28/2019  OT End of Session - 12/28/19 1751    Visit Number  9    Number of Visits  12    Date for OT Re-Evaluation  01/12/20    Authorization Type  Pt is [place]    OT Start Time  [phone]    OT Stop Time  1710    OT Time Calculation (min)  55 min    Activity Tolerance  Patient tolerated treatment well    Behavior During Therapy  [facility] for tasks assessed/performed       Past Medical History:  Diagnosis Date  . BPH (benign prostatic hyperplasia)   . Extensor tendon laceration, finger, open wound, initial encounter    left thumb  . Hypertension   . Hypothyroidism   . Macrocytosis     Past Surgical History:  Procedure Laterality Date  . COLONOSCOPY WITH PROPOFOL N/A 12/18/2018   Procedure: COLONOSCOPY WITH BIOPSY;  Surgeon: Lucilla Lame, MD;  Location: [place];  Service: Endoscopy;  Laterality: N/A;  NEEDS TO STAY FIRST  . ESOPHAGOGASTRODUODENOSCOPY    . ESOPHAGOGASTRODUODENOSCOPY (EGD) WITH PROPOFOL N/A 02/23/2019   Procedure: ESOPHAGOGASTRODUODENOSCOPY (EGD) WITH PROPOFOL;  Surgeon: Lucilla Lame, MD;  Location: [facility] ENDOSCOPY;  Service: Endoscopy;  Laterality: N/A;  . HERNIA REPAIR    . LIPOMA EXCISION Right    upper arm  . POLYPECTOMY N/A 12/18/2018   Procedure: POLYPECTOMY;  Surgeon: Lucilla Lame, MD;  Location: [place];  Service: Endoscopy;  Laterality: N/A;  . TENDON REPAIR Left 11/05/2019   Procedure: LEFT THUMB TENDON REPAIR;  Surgeon: Cindra Presume, MD;  Location: [place];  Service: Plastics;  Laterality: Left;  Axillary block, MAC  . UMBILICAL HERNIA REPAIR       There were no vitals filed for this visit.  Subjective Assessment - 12/28/19 1748    Subjective   I am about 8 wks and my thumb hurts - more now than it was after surgery -  I cannot keep compression on -it swells up-I am using it more and as stiff- putty easy and weight    Patient Stated Goals  Need my hand to use in surgery for clap - need to be able to make pinch grip- and use my hand in yardwork, fishing    Currently in Pain?  Yes    Pain Score  6     Pain Location  Hand    Pain Orientation  Left    Pain Descriptors / Indicators  Aching;Tender;Numbness;Pins and needles    Pain Type  Surgical pain    Pain Onset  More than a month ago    Pain Frequency  Constant    Aggravating Factors   with putty, scar massage and AROM         [facility] OT Assessment - 12/28/19 0001      Left Hand AROM   L Thumb MCP 0-60  60 Degrees    L Thumb IP 0-80  45 Degrees   PROM 45-50    L Thumb Opposition to Index  --   Opposition to base of 5th     Pt  report some numbness and sensations feeling over thumb - assess for hyper sensitivity but able to tolerate massage , softer and rougher textures But pt has increase edema at [facility] of thumb compare to R - by 1 cm  Doing contrast mostly to [facility] -and edema at [facility] and proximal  Pain 6/10  Decrease after contrast to 1/10 with AROM and strengthening   larger compression glove provided and done coban wrap with figure 8 around base of thumb and around wrist to keep from rolling - tolerate well  pt priority for HEP this next 2 days is decrease edema and maintain ROM  Keep pain under 2/10           OT Treatments/Exercises (OP) - 12/28/19 0001      LUE Fluidotherapy   Number Minutes Fluidotherapy  13 Minutes    LUE Fluidotherapy Location  Hand;Wrist    Comments  AROM for thumb - did ice 2 min x 2 inbetween to decrease pain and edema      scar massage done gentle with vibration , anduse ofcobanfor scar massage by wifebut thumb in [place] flexion to hold  during scar massage   PROM for IP flexion   composite PROM of thumbwith and without blocking of MCto base of 5th to encourage more IP flexion during composite flexion of thumb  Followed by opposition andsliding down 4th and 5th  Full AROM extention of thumbin between  Thumb PA and RA AROM  Add rubber band - place and hold of PA  12 reps - 2 x day    upgrade to teal putty  - for gripping , 2  point grip alternate digits and Lat grip- 12 reps 2 x day  And can increase putty and rubber band to 2 sets in 3 days if pain free and increase 3 sets 5-6 days    OT Education - 12/28/19 1750    Education Details  progress and changes to HEP    Person(s) Educated  Patient    Methods  Explanation;Demonstration;Handout    Comprehension  Verbalized understanding;Returned demonstration       OT Short Term Goals - 11/17/19 0958      OT SHORT TERM GOAL #1   Title  Pt will Mod I splint use, care and precautions L thumb/wrist/forearm    Time  4    Period  Weeks    Status  New    Target Date  12/15/19      OT SHORT TERM GOAL #2   Title  Pt wil lbe Mod  I scar management L dorsal thumb    Time  4    Period  Weeks    Status  New    Target Date  12/15/19      OT SHORT TERM GOAL #3   Title  Pt will be Mod I HEP L thumb/wrist    Time  4    Period  Weeks    Status  New    Target Date  12/15/19        OT Long Term Goals - 11/17/19 0959      OT LONG TERM GOAL #1   Title  Pt will be Mod I upgraded HEP & scar management L thumb    Time  8    Period  Weeks    Status  New    Target Date  12/29/19      OT LONG TERM GOAL #2   Title  Pt will demonstrate functional  flexion/A/ROM L thumb as seen by ability to oppose thumb to all digits 1-5 and to the base of small finger in preparation of holding surgical tools    Time  8    Period  Weeks    Status  New    Target Date  12/29/19      OT LONG TERM GOAL #3   Title  Pt will demonstrate functional active extension, abduction and rotation  L thumb as seen by goniometer measurement in preparation for return to work related tasks    Time  8    Period  Weeks    Status  New    Target Date  12/29/19      OT LONG TERM GOAL #4   Title  Pt will demonstrate sensibility WFL's L thumb as indicated by semmes weinstein monofilaments assessment of 2.83 and subjective reports of decreased paresthesias dorsally.    Time  8    Period  Weeks    Status  New    Target Date  12/29/19            Plan - 12/28/19 1751    Clinical Impression Statement  Pt is 7 1/2 wks L thumb EPL /EPB repair - pt show increase funcional use and strength in L hand and thumb -but this date report increase pain in thumb with HEP and increase edema at MP of thumb - 1 cm - hard time getting compression on it - did gave pt some other options and with contrast pain decrease to 1/10 during session    OT Occupational Profile and History  Problem Focused Assessment - Including review of records relating to presenting problem    Occupational performance deficits (Please refer to evaluation for details):  ADL's;IADL's;Work    Body Structure / Function / Physical Skills  ADL;Dexterity;ROM;Edema;Scar mobility;Sensation;Mobility;Skin integrity;Flexibility;Strength;Coordination;FMC;Pain;UE functional use    Rehab Potential  Good    Clinical Decision Making  Limited treatment options, no task modification necessary    Comorbidities Affecting Occupational Performance:  None    Modification or Assistance to Complete Evaluation   No modification of tasks or assist necessary to complete eval    OT Frequency  2x / week    OT Duration  --   3 wks   OT Treatment/Interventions  Self-care/ADL training;Fluidtherapy;Splinting;Therapeutic activities;Ultrasound;Therapeutic exercise;Scar mobilization;Cryotherapy;Passive range of motion;Manual Therapy;Patient/family education    Plan  assess HEP progress, tolerance , scar massage initiate, upgrade HEP to tolerance    OT Home Exercise Plan   see pt instruction    Consulted and Agree with Plan of Care  Patient       Patient will benefit from skilled therapeutic intervention in order to improve the following deficits and impairments:   Body Structure / Function / Physical Skills: ADL, Dexterity, ROM, Edema, Scar mobility, Sensation, Mobility, Skin integrity, Flexibility, Strength, Coordination, FMC, Pain, UE functional use       Visit Diagnosis: Pain in joint of left hand  Other lack of coordination  Muscle weakness (generalized)  Localized edema    Problem List There are no problems to display for this patient.   Rosalyn Gess OTR/L,CLT 12/28/2019, 5:55 PM  [place] [facility] [address], [place], [address] Phone: [phone]   Fax:  [phone]  Name: Alexander Rojas MRN: [id number] Date of Birth: October 26, 1955

## 2019-12-31 ENCOUNTER — Ambulatory Visit: Payer: 59 | Admitting: Occupational Therapy

## 2019-12-31 ENCOUNTER — Other Ambulatory Visit: Payer: Self-pay

## 2019-12-31 DIAGNOSIS — M6281 Muscle weakness (generalized): Secondary | ICD-10-CM

## 2019-12-31 DIAGNOSIS — M25542 Pain in joints of left hand: Secondary | ICD-10-CM

## 2019-12-31 DIAGNOSIS — R600 Localized edema: Secondary | ICD-10-CM

## 2019-12-31 DIAGNOSIS — R278 Other lack of coordination: Secondary | ICD-10-CM

## 2019-12-31 NOTE — Therapy (Signed)
[facility] [address], [place], [address] Phone: [phone]   Fax:  [phone]  Occupational Therapy Treatment  Patient Details  Name: Alexander Rojas MRN: [id number] Date of Birth: Mar 19, 1956 Referring Provider (OT): Dr Steffanie Dunn. Pace   Encounter Date: 12/31/2019  OT End of Session - 12/31/19 1326    Visit Number  10    Number of Visits  12    Date for OT Re-Evaluation  01/12/20    Authorization Type  Pt is [place]    OT Start Time  [phone]    OT Stop Time  [phone]    OT Time Calculation (min)  58 min    Activity Tolerance  Patient tolerated treatment well    Behavior During Therapy  [facility] for tasks assessed/performed       Past Medical History:  Diagnosis Date  . BPH (benign prostatic hyperplasia)   . Extensor tendon laceration, finger, open wound, initial encounter    left thumb  . Hypertension   . Hypothyroidism   . Macrocytosis     Past Surgical History:  Procedure Laterality Date  . COLONOSCOPY WITH PROPOFOL N/A 12/18/2018   Procedure: COLONOSCOPY WITH BIOPSY;  Surgeon: Lucilla Lame, MD;  Location: [place];  Service: Endoscopy;  Laterality: N/A;  NEEDS TO STAY FIRST  . ESOPHAGOGASTRODUODENOSCOPY    . ESOPHAGOGASTRODUODENOSCOPY (EGD) WITH PROPOFOL N/A 02/23/2019   Procedure: ESOPHAGOGASTRODUODENOSCOPY (EGD) WITH PROPOFOL;  Surgeon: Lucilla Lame, MD;  [facility] END[facility]COPY;  Service: Endoscopy;  Laterality: N/A;  . HERNIA REPAIR    . LIPOMA EXCISION Right    upper arm  . POLYPECTOMY N/A 12/18/2018   Procedure: POLYPECTOMY;  Surgeon: Lucilla Lame, MD;  Location: [place];  Service: Endoscopy;  Laterality: N/A;  . TENDON REPAIR Left 11/05/2019   Procedure: LEFT THUMB TENDON REPAIR;  Surgeon: Cindra Presume, MD;  Location: [place];  Service: Plastics;  Laterality: Left;  Axillary block, MAC  . UMBILICAL HERNIA REPAIR       There were no vitals filed for this visit.  Subjective Assessment - 12/31/19 1322    Subjective   The pain is better- about 1-2 /10 and with putty about 4 /10 to 4th and 5th - but the swelling - if keeps on coming back if I don't have compression on - like in hour it swells up and then it is stiff and I cannot use it - I cannot do surgery like this    Patient Stated Goals  Need my hand to use in surgery for clap - need to be able to make pinch grip- and use my hand in yardwork, fishing    Currently in Pain?  Yes    Pain Location  Hand    Pain Orientation  Left    Pain Descriptors / Indicators  Aching;Tender;Tightness;Numbness;Pins and needles    Pain Type  Surgical pain    Pain Onset  More than a month ago    Pain Frequency  Intermittent         [facility] OT Assessment - 12/31/19 0001      Left Hand AROM   L Thumb MCP 0-60  60 Degrees    L Thumb IP 0-80  50 Degrees        Pt's thumb edema per pt increase after just leaving off compression for hour - and then thumb get stiff Pt able  to get edema under 1 c at [facility] of thumb compare to R  Had less pain this date- about 1-2/10 and but do report increase to 4/10 with putty pinch with 4th and 5th   Pain and edema decrease after contrast to 1/10 with AROM and strengthening  Last time  larger compression glove provided and done coban wrap with figure 8 around base of thumb and around wrist to keep from rolling - tolerate well- and pt to cont that as well as reinforce with pt contrast and ice at end of sessio of HEP    Keep pain under 2/10 reinforce         OT Treatments/Exercises (OP) - 12/31/19 0001      LUE Fluidotherapy   Number Minutes Fluidotherapy  13 Minutes    LUE Fluidotherapy Location  Hand;Wrist    Comments  AROM of thumb with ice inbetween for edema         scar massage done gentle with vibration , anduse ofcobanfor scar massage by OT with thumb flex -and wife to cont with scar massage at home   PROM for IP  flexion  and prolonged flexion stretch    composite PROM of thumbwith and without blocking of MCto base of 5th to encourage more IP flexion during composite flexion of thumb - reinforce  Followed by opposition andsliding down 4th and 5th  Full AROM extention of thumbin between  Thumb PA and RA AROM  Add rubber band - place and hold of PA  12 reps and can increase to 2 bands - did not had any issues- start 1 set of 12 and then increase gradually sets  - 2 x day   cont  teal putty- for gripping , 2  point grip alternate digits and Lat grip-  But do prehension into the ball of putty  3 x 12 reps 2 x day  Focus on edema control        OT Education - 12/31/19 1326    Education Details  progress and changes to HEP    Person(s) Educated  Patient    Methods  Explanation;Demonstration;Handout    Comprehension  Verbalized understanding;Returned demonstration       OT Short Term Goals - 11/17/19 0958      OT SHORT TERM GOAL #1   Title  Pt will Mod I splint use, care and precautions L thumb/wrist/forearm    Time  4    Period  Weeks    Status  New    Target Date  12/15/19      OT SHORT TERM GOAL #2   Title  Pt wil lbe Mod  I scar management L dorsal thumb    Time  4    Period  Weeks    Status  New    Target Date  12/15/19      OT SHORT TERM GOAL #3   Title  Pt will be Mod I HEP L thumb/wrist    Time  4    Period  Weeks    Status  New    Target Date  12/15/19        OT Long Term Goals - 11/17/19 0959      OT LONG TERM GOAL #1   Title  Pt will be Mod I upgraded HEP & scar management L thumb    Time  8    Period  Weeks    Status  New    Target Date  12/29/19  OT LONG TERM GOAL #2   Title  Pt will demonstrate functional flexion/A/ROM L thumb as seen by ability to oppose thumb to all digits 1-5 and to the base of small finger in preparation of holding surgical tools    Time  8    Period  Weeks    Status  New    Target Date  12/29/19      OT LONG TERM  GOAL #3   Title  Pt will demonstrate functional active extension, abduction and rotation L thumb as seen by goniometer measurement in preparation for return to work related tasks    Time  8    Period  Weeks    Status  New    Target Date  12/29/19      OT LONG TERM GOAL #4   Title  Pt will demonstrate sensibility WFL's L thumb as indicated by semmes weinstein monofilaments assessment of 2.83 and subjective reports of decreased paresthesias dorsally.    Time  8    Period  Weeks    Status  New    Target Date  12/29/19            Plan - 12/31/19 1326    Clinical Impression Statement  Pt is 8 wks s/p L thumb EPL and EPB repair - pt making steady progress in AROM , strength and functional use - but cont to have edema - can decrease it with compression from coban and contrast and keeping pain under control -  but cont to have  edeme return - causing stiffness- ed pt that it is normal and just to cont scar mobs , AROM, functional use - not over doing strengthening - doing contrast or ice and coban wrap to decrease pain    OT Occupational Profile and History  Problem Focused Assessment - Including review of records relating to presenting problem    Occupational performance deficits (Please refer to evaluation for details):  ADL's;IADL's;Work    Body Structure / Function / Physical Skills  ADL;Dexterity;ROM;Edema;Scar mobility;Sensation;Mobility;Skin integrity;Flexibility;Strength;Coordination;FMC;Pain;UE functional use    Rehab Potential  Good    Clinical Decision Making  Limited treatment options, no task modification necessary    Comorbidities Affecting Occupational Performance:  None    Modification or Assistance to Complete Evaluation   No modification of tasks or assist necessary to complete eval    OT Frequency  2x / week    OT Duration  2 weeks    OT Treatment/Interventions  Self-care/ADL training;Fluidtherapy;Splinting;Therapeutic activities;Ultrasound;Therapeutic exercise;Scar  mobilization;Cryotherapy;Passive range of motion;Manual Therapy;Patient/family education    Plan  assess HEP progress, tolerance , scar massage initiate, upgrade HEP to tolerance    OT Home Exercise Plan  see pt instruction    Consulted and Agree with Plan of Care  Patient       Patient will benefit from skilled therapeutic intervention in order to improve the following deficits and impairments:   Body Structure / Function / Physical Skills: ADL, Dexterity, ROM, Edema, Scar mobility, Sensation, Mobility, Skin integrity, Flexibility, Strength, Coordination, FMC, Pain, UE functional use       Visit Diagnosis: Pain in joint of left hand  Other lack of coordination  Muscle weakness (generalized)  Localized edema    Problem List There are no problems to display for this patient.   Rosalyn Gess OTR/l,CLT 12/31/2019, 1:30 PM  [place] [facility] [address] [place], [address] Phone: [phone]   Fax:  [phone]  Name: Alexander Rojas MRN: [id number] Date of Birth: [birth year]

## 2019-12-31 NOTE — Patient Instructions (Signed)
Same HEP but can do 2 rubber bands of extention of thumb PA and RA - pain free -and only 1 sets of 12 and work up over 2-3 days to 2 sets  And putty make it in ball and do grip ,lat and 3 point grip - 3 x 12 reps  Cont scar mobs with thumb in flexion  And PROM for thumb IP , composite - but block MC to put leverage on IP flexion  Cont compression - coban -  but try on and off 59min and then increase to hour and cont to do contrast and ice

## 2020-01-03 ENCOUNTER — Other Ambulatory Visit: Payer: Self-pay

## 2020-01-03 ENCOUNTER — Ambulatory Visit: Payer: 59 | Admitting: Occupational Therapy

## 2020-01-03 DIAGNOSIS — M6281 Muscle weakness (generalized): Secondary | ICD-10-CM | POA: Diagnosis not present

## 2020-01-03 DIAGNOSIS — R600 Localized edema: Secondary | ICD-10-CM | POA: Diagnosis not present

## 2020-01-03 DIAGNOSIS — M25542 Pain in joints of left hand: Secondary | ICD-10-CM | POA: Diagnosis not present

## 2020-01-03 DIAGNOSIS — R278 Other lack of coordination: Secondary | ICD-10-CM | POA: Diagnosis not present

## 2020-01-03 NOTE — Patient Instructions (Signed)
Cont with scar massage , PROM and AROM  But hold off on putty pinching  But can cont gripping teal putty Did add light blue easy putty for thumb PA and RA - 15 reps -or 2 x 8 reps  2 x day - stop rubber band  kinesiotape provided for pt to use achor at about radial styloid and pull 2 strips over dorsal thumb to IP - at 20% pull - for scar mobs and lymphatic facilitation

## 2020-01-03 NOTE — Therapy (Signed)
[place] [facility] [address] [place], [address] Phone: [phone]   Fax:  [phone]  Occupational Therapy Treatment  Patient Details  Name: Alexander Rojas MRN: [id number] Date of Birth: 1956-04-14 Referring Provider (OT): Dr Steffanie Dunn. Pace   Encounter Date: 01/03/2020  OT End of Session - 01/03/20 1709    Visit Number  11    Number of Visits  12    Date for OT Re-Evaluation  01/12/20    Authorization Type  Pt is [place]    OT Start Time  [id number]    OT Stop Time  1656    OT Time Calculation (min)  59 min    Activity Tolerance  Patient tolerated treatment well    Behavior During Therapy  [facility] for tasks assessed/performed       Past Medical History:  Diagnosis Date  . BPH (benign prostatic hyperplasia)   . Extensor tendon laceration, finger, open wound, initial encounter    left thumb  . Hypertension   . Hypothyroidism   . Macrocytosis     Past Surgical History:  Procedure Laterality Date  . COLONOSCOPY WITH PROPOFOL N/A 12/18/2018   Procedure: COLONOSCOPY WITH BIOPSY;  Surgeon: Lucilla Lame, MD;  Location: [place];  Service: Endoscopy;  Laterality: N/A;  NEEDS TO STAY FIRST  . ESOPHAGOGASTRODUODENOSCOPY    . ESOPHAGOGASTRODUODENOSCOPY (EGD) WITH PROPOFOL N/A 02/23/2019   Procedure: ESOPHAGOGASTRODUODENOSCOPY (EGD) WITH PROPOFOL;  Surgeon: Lucilla Lame, MD;  Location: [facility] ENDOSCOPY;  Service: Endoscopy;  Laterality: N/A;  . HERNIA REPAIR    . LIPOMA EXCISION Right    upper arm  . POLYPECTOMY N/A 12/18/2018   Procedure: POLYPECTOMY;  Surgeon: Lucilla Lame, MD;  Location: [place];  Service: Endoscopy;  Laterality: N/A;  . TENDON REPAIR Left 11/05/2019   Procedure: LEFT THUMB TENDON REPAIR;  Surgeon: Cindra Presume, MD;  Location: [place];  Service: Plastics;  Laterality: Left;  Axillary block, MAC  . UMBILICAL HERNIA REPAIR       There were no vitals filed for this visit.  Subjective Assessment - 01/03/20 1704    Subjective   So I left any compression off my thumb since Sat AM - so 2 1/2 days- this is how it looks - not to bad - can touch the bottom of my pinkie and used it some this weekend    Patient Stated Goals  Need my hand to use in surgery for clap - need to be able to make pinch grip- and use my hand in yardwork, fishing    Currently in Pain?  Yes    Pain Score  --   not provided        [facility] OT Assessment - 01/03/20 0001      Left Hand AROM   L Thumb MCP 0-60  60 Degrees    L Thumb IP 0-80  50 Degrees   55 in session after prolonged flexion and PROM IP flexion       scar massage done gentle with vibration , anduse ofcobanfor scar massage by OT with thumb flex -and wife to cont with scar massage at home  And done some xtractor with AROM for thumb flexion   PROM for IP flexion and prolonged flexion stretch by OT with MC block   composite PROM of thumbwith and without blocking of MCto base of 5th to encourage more IP flexion  during composite flexion of thumb - reinforce  Followed by opposition andsliding down 4th and 5th  Full AROM extention of thumbin between  Thumb PA and RA AROM Add and review with pt  light blue easy putty for thumb PA and RA - 15 reps -or 2 x 8 reps  2 x day - stop rubber band   cont  teal putty- for gripping only -HOLD off on 2 point and lat grip - because of some volar thumb IP pain - tenderness   Cont edema control    kinesiotape done this date and xtra provided for pt to use achor at about radial styloid and pull 2 strips over dorsal thumb to IP - at 20% pull - for scar mobs and lymphatic facilitation   pt video to show wife to assist with       OT Treatments/Exercises (OP) - 01/03/20 0001      LUE Fluidotherapy   Number Minutes Fluidotherapy  12 Minutes    LUE Fluidotherapy Location  Hand;Wrist    Comments  AROM of thumb with ice 2  times inbetween              OT Education - 01/03/20 1708    Education Details  progress and changes to HEP    Person(s) Educated  Patient    Methods  Explanation;Demonstration;Handout    Comprehension  Verbalized understanding;Returned demonstration       OT Short Term Goals - 11/17/19 0958      OT SHORT TERM GOAL #1   Title  Pt will Mod I splint use, care and precautions L thumb/wrist/forearm    Time  4    Period  Weeks    Status  New    Target Date  12/15/19      OT SHORT TERM GOAL #2   Title  Pt wil lbe Mod  I scar management L dorsal thumb    Time  4    Period  Weeks    Status  New    Target Date  12/15/19      OT SHORT TERM GOAL #3   Title  Pt will be Mod I HEP L thumb/wrist    Time  4    Period  Weeks    Status  New    Target Date  12/15/19        OT Long Term Goals - 11/17/19 0959      OT LONG TERM GOAL #1   Title  Pt will be Mod I upgraded HEP & scar management L thumb    Time  8    Period  Weeks    Status  New    Target Date  12/29/19      OT LONG TERM GOAL #2   Title  Pt will demonstrate functional flexion/A/ROM L thumb as seen by ability to oppose thumb to all digits 1-5 and to the base of small finger in preparation of holding surgical tools    Time  8    Period  Weeks    Status  New    Target Date  12/29/19      OT LONG TERM GOAL #3   Title  Pt will demonstrate functional active extension, abduction and rotation L thumb as seen by goniometer measurement in preparation for return to work related tasks    Time  8    Period  Weeks    Status  New    Target Date  12/29/19  OT LONG TERM GOAL #4   Title  Pt will demonstrate sensibility WFL's L thumb as indicated by semmes weinstein monofilaments assessment of 2.83 and subjective reports of decreased paresthesias dorsally.    Time  8    Period  Weeks    Status  New    Target Date  12/29/19            Plan - 01/03/20 1709    Clinical Impression Statement  Pt is 8 1/2 wks s/p   thumb EPL and EPB repair - pt show decrease edema this date after not applying compression the last 2 days - AROM of thumb to base of 5th and able to do IP flexion this date in session to 55 degrees - pt to use kinesiotape with achor at wrist and 2 strips over dorsal thumb to IP - pt did had some pain on volar thumb tip because of maybe over doing putty pinching - to hold off on prehension - only gripping    OT Occupational Profile and History  Problem Focused Assessment - Including review of records relating to presenting problem    Occupational performance deficits (Please refer to evaluation for details):  ADL's;IADL's;Work    Body Structure / Function / Physical Skills  ADL;Dexterity;ROM;Edema;Scar mobility;Sensation;Mobility;Skin integrity;Flexibility;Strength;Coordination;FMC;Pain;UE functional use    Rehab Potential  Good    Clinical Decision Making  Limited treatment options, no task modification necessary    Comorbidities Affecting Occupational Performance:  None    Modification or Assistance to Complete Evaluation   No modification of tasks or assist necessary to complete eval    OT Frequency  2x / week    OT Duration  2 weeks    OT Treatment/Interventions  Self-care/ADL training;Fluidtherapy;Splinting;Therapeutic activities;Ultrasound;Therapeutic exercise;Scar mobilization;Cryotherapy;Passive range of motion;Manual Therapy;Patient/family education    Plan  assess HEP progress, tolerance , scar massage, upgrade HEP to tolerance and edema monitor    OT Home Exercise Plan  see pt instruction    Consulted and Agree with Plan of Care  Patient       Patient will benefit from skilled therapeutic intervention in order to improve the following deficits and impairments:   Body Structure / Function / Physical Skills: ADL, Dexterity, ROM, Edema, Scar mobility, Sensation, Mobility, Skin integrity, Flexibility, Strength, Coordination, FMC, Pain, UE functional use       Visit Diagnosis: Pain in  joint of left hand  Other lack of coordination  Muscle weakness (generalized)  Localized edema    Problem List There are no problems to display for this patient.   Rosalyn Gess OTR/L,CLT 01/03/2020, 5:15 PM  [place] [facility] [address], [place], [address] Phone: [phone]   Fax:  [phone]  Name: Alexander Rojas MRN: [id number] Date of Birth: 1956/03/17

## 2020-01-07 ENCOUNTER — Ambulatory Visit: Payer: 59 | Admitting: Occupational Therapy

## 2020-01-07 ENCOUNTER — Other Ambulatory Visit: Payer: Self-pay

## 2020-01-07 DIAGNOSIS — M6281 Muscle weakness (generalized): Secondary | ICD-10-CM | POA: Diagnosis not present

## 2020-01-07 DIAGNOSIS — R278 Other lack of coordination: Secondary | ICD-10-CM | POA: Diagnosis not present

## 2020-01-07 DIAGNOSIS — M25542 Pain in joints of left hand: Secondary | ICD-10-CM

## 2020-01-07 DIAGNOSIS — R600 Localized edema: Secondary | ICD-10-CM

## 2020-01-07 NOTE — Patient Instructions (Signed)
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 - time himself - how long can do prior to fatigue and edema or pain - increase act gradually Also when is thumb at the best with edema - in am and then maybe do some ice and compression in afternoon  Use O'conner tweezer dexterity test and practice some of it for endurance - and dexterity using forceps to pick up and place in holes - work on accuracy , speed and ease  Can also use 2 point pinch and take out pin and turn up side down and place again -time and increase amounts of pin   When returning to work for surgery-  First assist 25% for about week ,then increase 50% week , then 75% then 100% -  Then do 25% and have somebody assist him, and do same increase 25% a week - Do surgery in am when swelling is decrease and pm maybe office hours - do ice in between surgeries if needed  And also maybe every other day - 2-3 x week in beginning and increase gradually as thumb can tolerate and he feels more confidence   HOLD off on putty pinching - can do gripping and putty for thumb PA and RA -

## 2020-01-07 NOTE — Therapy (Signed)
[facility] [address] [place], [address] Phone: [phone]   Fax:  [phone]  Occupational Therapy Treatment  Patient Details  Name: Alexander Rojas MRN: [id number] Date of Birth: 1956-02-02 Referring Provider (OT): Dr Steffanie Dunn. Pace   Encounter Date: 01/07/2020  OT End of Session - 01/07/20 1425    Visit Number  12    Number of Visits  12    Date for OT Re-Evaluation  01/12/20    Authorization Type  Pt is [place]    OT Start Time  0740    OT Stop Time  [phone]    OT Time Calculation (min)  57 min    Activity Tolerance  Patient tolerated treatment well    Behavior During Therapy  [facility] for tasks assessed/performed       Past Medical History:  Diagnosis Date  . BPH (benign prostatic hyperplasia)   . Extensor tendon laceration, finger, open wound, initial encounter    left thumb  . Hypertension   . Hypothyroidism   . Macrocytosis     Past Surgical History:  Procedure Laterality Date  . COLONOSCOPY WITH PROPOFOL N/A 12/18/2018   Procedure: COLONOSCOPY WITH BIOPSY;  Surgeon: Lucilla Lame, MD;  Location: [place];  Service: Endoscopy;  Laterality: N/A;  NEEDS TO STAY FIRST  . ESOPHAGOGASTRODUODENOSCOPY    . ESOPHAGOGASTRODUODENOSCOPY (EGD) WITH PROPOFOL N/A 02/23/2019   Procedure: ESOPHAGOGASTRODUODENOSCOPY (EGD) WITH PROPOFOL;  Surgeon: Lucilla Lame, MD;  Location: [facility] ENDOSCOPY;  Service: Endoscopy;  Laterality: N/A;  . HERNIA REPAIR    . LIPOMA EXCISION Right    upper arm  . POLYPECTOMY N/A 12/18/2018   Procedure: POLYPECTOMY;  Surgeon: Lucilla Lame, MD;  Location: [place];  Service: Endoscopy;  Laterality: N/A;  . TENDON REPAIR Left 11/05/2019   Procedure: LEFT THUMB TENDON REPAIR;  Surgeon: Cindra Presume, MD;  Location: [place];  Service: Plastics;  Laterality: Left;  Axillary block, MAC  . UMBILICAL HERNIA REPAIR       There were no vitals filed for this visit.  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  Yes    Pain Score  2     Pain Location  --    Pain Orientation  Left    Pain Descriptors / Indicators  Aching;Tightness    Pain Type  Surgical pain    Pain Onset  More than a month ago    Pain Frequency  Intermittent         [facility] OT Assessment - 01/07/20 0001      Strength   Right Hand Grip (lbs)  100    Right Hand Lateral Pinch  27 lbs    Right Hand 3 Point Pinch  24 lbs    Left Hand Grip (lbs)  90    Left Hand Lateral Pinch  18 lbs  Left Hand 3 Point Pinch  18 lbs      Pt arrive with reports of still increase edema more in the pm than morning  Pain decrease -still some tenderness over volar  L thumb IP from over doing putty last week  - but better - pt to hold off on putty still   Did this date some dexterity and FMC assessment - modified [place]  Manual dexterity test - R 1 min 9 sec , L 1 min 14 sec flipping on at time -   [place]ner - putting one at time in - top 10 only  R 26 .74 sec R , 27.41 sec L   Putting in top 10 with forceps - R 55 sec - drop 2 , L 55 sec     Discuss with pt about simulating for home program his  work activities and surgery - do in cadaver lab at [facility] some simulation - time himself - how long can do prior to fatigue and edema or pain - increase act gradually Also when is thumb at the best with edema - in am and then maybe do some ice and compression in afternoon  Use [place]ner tweezer dexterity test and practice some of it for endurance - and dexterity using forceps to pick up  and place in holes - work on accuracy , speed and ease  Can also use 2 point pinch and take out pin and turn up side down and place again -time and increase amounts of pins    And think of options to return to work for surgery-  First assist 25% for about week ,then increase 50% week , then 75% then 100% -  Then do 25% and have somebody assist him, and do same increase 25% a week - Do surgery in am when swelling is decrease and pm maybe office hours - do ice in between surgeries if needed  And also maybe every other day - 2-3 x week in beginning and increase gradually as thumb can tolerate and he feels more confidence   HOLD off on putty pinching - can do gripping and putty for thum        OT Treatments/Exercises (OP) - 01/07/20 0001      LUE Fluidotherapy   Number Minutes Fluidotherapy  6 Minutes    LUE Fluidotherapy Location  Hand;Wrist    Comments  AROM for thumb in all planes - prior to scar massage        scar massage done gentle with vibration , anduse ofcobanfor scar massage byOT with thumb flex but now also in full extention -andwife to cont with scar massage at home And done some xtractor with AROM for thumb flexion   PROM for IP flexionand prolonged flexion stretchby OT with MC block   composite PROM of thumbwith and without blocking of MCto base of 5th to encourage more IP flexion during composite flexion of thumb- reinforce Followed by opposition andsliding down 4th and 5th  Full AROM extention of thumbin between       OT Education - 01/07/20 1425    Education Details  progress and changes to HEP    Person(s) Educated  Patient    Methods  Explanation;Demonstration;Handout    Comprehension  Verbalized understanding;Returned demonstration       OT Short Term Goals - 11/17/19 0958      OT SHORT TERM GOAL #1   Title  Pt will Mod I splint use, care and precautions L thumb/wrist/forearm  Time  4    Period  Weeks    Status  New     Target Date  12/15/19      OT SHORT TERM GOAL #2   Title  Pt wil lbe Mod  I scar management L dorsal thumb    Time  4    Period  Weeks    Status  New    Target Date  12/15/19      OT SHORT TERM GOAL #3   Title  Pt will be Mod I HEP L thumb/wrist    Time  4    Period  Weeks    Status  New    Target Date  12/15/19        OT Long Term Goals - 11/17/19 0959      OT LONG TERM GOAL #1   Title  Pt will be Mod I upgraded HEP & scar management L thumb    Time  8    Period  Weeks    Status  New    Target Date  12/29/19      OT LONG TERM GOAL #2   Title  Pt will demonstrate functional flexion/A/ROM L thumb as seen by ability to oppose thumb to all digits 1-5 and to the base of small finger in preparation of holding surgical tools    Time  8    Period  Weeks    Status  New    Target Date  12/29/19      OT LONG TERM GOAL #3   Title  Pt will demonstrate functional active extension, abduction and rotation L thumb as seen by goniometer measurement in preparation for return to work related tasks    Time  8    Period  Weeks    Status  New    Target Date  12/29/19      OT LONG TERM GOAL #4   Title  Pt will demonstrate sensibility WFL's L thumb as indicated by semmes weinstein monofilaments assessment of 2.83 and subjective reports of decreased paresthesias dorsally.    Time  8    Period  Weeks    Status  New    Target Date  12/29/19            Plan - 01/07/20 1426    Clinical Impression Statement  Pt is 9 wks s/p thumb EPL and EPB repair - pt show increaes strenght in thumb , PA , RA and grip and prehension see flowsheets  - pain on volar IP because of over doing putty pinching last week - hold off and work on conditioning , dexterity and [place] of thumb - monitor edema and pain - and simulate work activities - increase time gradually - without increasing edema and pain -    OT Occupational Profile and History  Problem Focused Assessment - Including review of records relating to  presenting problem    Occupational performance deficits (Please refer to evaluation for details):  ADL's;IADL's;Work    Body Structure / Function / Physical Skills  ADL;Dexterity;ROM;Edema;Scar mobility;Sensation;Mobility;Skin integrity;Flexibility;Strength;Coordination;FMC;Pain;UE functional use    Rehab Potential  Good    Clinical Decision Making  Limited treatment options, no task modification necessary    Comorbidities Affecting Occupational Performance:  None    Modification or Assistance to Complete Evaluation   No modification of tasks or assist necessary to complete eval    OT Frequency  2x / week    OT Duration  2 weeks  OT Treatment/Interventions  Self-care/ADL training;Fluidtherapy;Splinting;Therapeutic activities;Ultrasound;Therapeutic exercise;Scar mobilization;Cryotherapy;Passive range of motion;Manual Therapy;Patient/family education    Plan  assess ROM, strength, edema, pain , dexterity    OT Home Exercise Plan  see pt instruction    Consulted and Agree with Plan of Care  Patient       Patient will benefit from skilled therapeutic intervention in order to improve the following deficits and impairments:   Body Structure / Function / Physical Skills: ADL, Dexterity, ROM, Edema, Scar mobility, Sensation, Mobility, Skin integrity, Flexibility, Strength, Coordination, FMC, Pain, UE functional use       Visit Diagnosis: Pain in joint of left hand  Other lack of coordination  Muscle weakness (generalized)  Localized edema    Problem List There are no problems to display for this patient.   Rosalyn Gess OTR/L,CLT  01/07/2020, 2:30 PM  [facility] [address] [place], [address] Phone: [phone]   Fax:  [phone]  Name: Alexander Rojas MRN: [id number] Date of Birth: [birth year]

## 2020-01-10 ENCOUNTER — Ambulatory Visit: Payer: 59 | Admitting: Occupational Therapy

## 2020-01-10 ENCOUNTER — Other Ambulatory Visit: Payer: Self-pay

## 2020-01-10 DIAGNOSIS — M6281 Muscle weakness (generalized): Secondary | ICD-10-CM | POA: Diagnosis not present

## 2020-01-10 DIAGNOSIS — M25542 Pain in joints of left hand: Secondary | ICD-10-CM

## 2020-01-10 DIAGNOSIS — R278 Other lack of coordination: Secondary | ICD-10-CM | POA: Diagnosis not present

## 2020-01-10 DIAGNOSIS — R600 Localized edema: Secondary | ICD-10-CM

## 2020-01-10 NOTE — Patient Instructions (Signed)
Same as last time  

## 2020-01-10 NOTE — Therapy (Signed)
[facility] [address] [place], [address] Phone: [phone]   Fax:  [phone]  Occupational Therapy Treatment  Patient Details  Name: Alexander Rojas MRN: [id number] Date of Birth: 1955/11/22 Referring Provider (OT): Dr Steffanie Dunn. Pace   Encounter Date: 01/10/2020  OT End of Session - 01/10/20 1744    Visit Number  13    Number of Visits  18    Date for OT Re-Evaluation  03/06/20    Authorization Type  Pt is Alexander Rojas    OT Start Time  [phone]    OT Stop Time  1656    OT Time Calculation (min)  48 min    Activity Tolerance  Patient tolerated treatment well    Behavior During Therapy  [facility] for tasks assessed/performed       Past Medical History:  Diagnosis Date  . BPH (benign prostatic hyperplasia)   . Extensor tendon laceration, finger, open wound, initial encounter    left thumb  . Hypertension   . Hypothyroidism   . Macrocytosis     Past Surgical History:  Procedure Laterality Date  . COLONOSCOPY WITH PROPOFOL N/A 12/18/2018   Procedure: COLONOSCOPY WITH BIOPSY;  Surgeon: Lucilla Lame, MD;  Location: [place];  Service: Endoscopy;  Laterality: N/A;  NEEDS TO STAY FIRST  . ESOPHAGOGASTRODUODENOSCOPY    . ESOPHAGOGASTRODUODENOSCOPY (EGD) WITH PROPOFOL N/A 02/23/2019   Procedure: ESOPHAGOGASTRODUODENOSCOPY (EGD) WITH PROPOFOL;  Surgeon: Lucilla Lame, MD;  Location: [facility] ENDOSCOPY;  Service: Endoscopy;  Laterality: N/A;  . HERNIA REPAIR    . LIPOMA EXCISION Right    upper arm  . POLYPECTOMY N/A 12/18/2018   Procedure: POLYPECTOMY;  Surgeon: Lucilla Lame, MD;  Location: [place];  Service: Endoscopy;  Laterality: N/A;  . TENDON REPAIR Left 11/05/2019   Procedure: LEFT THUMB TENDON REPAIR;  Surgeon: Cindra Presume, MD;  Location: [place];  Service: Plastics;  Laterality: Left;  Axillary block, MAC  . UMBILICAL HERNIA REPAIR       There were no vitals filed for this visit.  Subjective Assessment - 01/10/20 1729    Subjective   I talked with ther surgeon and he explain to me about the vascular system in my thumb and that the venoues system is on dorsal hand -and swelling will be there about 6-12 more month - I did practice and used my hand a lot the weekend    Patient Stated Goals  Need my hand to use in surgery for clap - need to be able to make pinch grip- and use my hand in yardwork, fishing    Currently in Pain?  Yes    Pain Location  Finger (Comment which one)    Pain Orientation  Left    Pain Descriptors / Indicators  Aching;Tender;Tightness    Pain Type  Surgical pain    Pain Onset  More than a month ago         [facility] OT Assessment - 01/10/20 0001      Strength   Right Hand Grip (lbs)  100    Right Hand Lateral Pinch  27 lbs    Right Hand 3 Point Pinch  24 lbs    Left Hand Grip (lbs)  90    Left Hand Lateral Pinch  21 lbs    Left Hand 3 Point Pinch  10 lbs   tender over volar IP  Left Hand AROM   L Thumb MCP 0-60  60 Degrees    L Thumb IP 0-80  55 Degrees      Pt arrive with reports that edema still wants to increase after 2 -4 hrs - depending on what he does  Coban brings edema down but cannot bend it while in coban  Silicon digi sleeve and glove do not work - to tight and tip wants to swell  And if not wrapping to bring edema down - then he loose flexion  Did find product called co- wrap that works like coban but allow flexibility - but appear that discontinued - manager looking into if there are something similar          OT Treatments/Exercises (OP) - 01/10/20 0001      LUE Fluidotherapy   Number Minutes Fluidotherapy  9 Minutes    LUE Fluidotherapy Location  Hand;Wrist    Comments  AROM for thumb and prior to scar mobs        Discuss with pt again  about simulating for home program his  work activities and surgery - do in cadaver lab at [facility] some simulation - time  himself - how long can do prior to fatigue and edema or pain - increase act gradually Also when is thumb at the best with edema - in am and then maybe do some ice and compression in afternoon - and increase time and activities gradually to return to work   Used Mudlogger some of it for endurance this past weekend - and dexterity using forceps to pick up and place in holes - work on accuracy , speed and ease  - pt same if not faster than R hand - R 58 , L 44 sec for 10 pegs - pt would not have to do 100 at time - not using forceps long periods - rest period in between      Reinforce again to think of options to return to work for surgery-  First assist 25% for about week ,then increase 50% week , then 75% then 100% -  Then do 25% and have somebody assist him, and do same increase 25% a week - Do surgery in am when swelling is decrease and pm maybe office hours - do ice in between surgeries if needed  And also maybe every other day - 2-3 x week in beginning and increase gradually as thumb can tolerate and he feels more confidence   HOLD off on putty pinching - can do gripping and putty for thumb PA and RA         OT Education - 01/10/20 1732    Education Details  progress and HEP    Person(s) Educated  Patient    Methods  Explanation;Demonstration;Handout    Comprehension  Verbalized understanding;Returned demonstration       OT Short Term Goals - 01/10/20 1749      OT SHORT TERM GOAL #1   Title  Pt will Mod I splint use, care and precautions L thumb/wrist/forearm    Status  Achieved      OT SHORT TERM GOAL #2   Title  Pt wil lbe Mod  I scar management L dorsal thumb    Status  Achieved      OT SHORT TERM GOAL #3   Title  Pt will be Mod I HEP L thumb/wrist    Status  Achieved  OT Long Term Goals - 01/10/20 1749      OT LONG TERM GOAL #1   Title  Pt will be Mod I upgraded HEP & scar management L thumb    Status  Achieved       OT LONG TERM GOAL #2   Title  Pt will demonstrate functional flexion/A/ROM L thumb as seen by ability to oppose thumb to all digits 1-5 and to the base of small finger in preparation of holding surgical tools    Status  Achieved      OT LONG TERM GOAL #3   Title  Pt will demonstrate functional active extension, abduction and rotation L thumb as seen by goniometer measurement in preparation for return to work related tasks    Status  Achieved      OT LONG TERM GOAL #4   Title  Pt will demonstrate sensibility WFL's L thumb as indicated by semmes weinstein monofilaments assessment of 2.83 and subjective reports of decreased paresthesias dorsally.    Baseline  some parasthesias on dorsal thumb - but not volar - not affecting function    Status  Achieved      OT LONG TERM GOAL #5   Title  Pt scar adhesion improve for pt to have less edema in thumb and increase thumb ABD to grasp around wide objects    Baseline  Has to help to open thumb to grasp around wide jar , and edema increase after not wrap 2-3 hrs    Time  8    Period  Weeks    Status  New    Target Date  03/06/20            Plan - 01/10/20 1745    Clinical Impression Statement  Pt is 9 1/2 wks s/p R thumb EPL and EPB repair - pt show increase strength in thumb , PA and RA - and grip and prehension - he done great in dexterity text and compare to R hand - WNL - but need to work on some simulation of his work in [employer] lab and then gradually over the next few wks increase assiting other surgeons - before doing any surgery himself - cont to have edema that increase after 2-4 hrs and end of day - did find co wrap that he can wear under his glove adn allow flexbility more than coban - but appear it was discontinues - will check into if anything similiar    OT Occupational Profile and History  Problem Focused Assessment - Including review of records relating to presenting problem    Occupational performance deficits (Please refer to  evaluation for details):  ADL's;IADL's;Work    Body Structure / Function / Physical Skills  ADL;Dexterity;ROM;Edema;Scar mobility;Sensation;Mobility;Skin integrity;Flexibility;Strength;Coordination;FMC;Pain;UE functional use    Rehab Potential  Good    Clinical Decision Making  Limited treatment options, no task modification necessary    Comorbidities Affecting Occupational Performance:  None    Modification or Assistance to Complete Evaluation   No modification of tasks or assist necessary to complete eval    OT Frequency  --   2 x this week and then biweekly for 8 wks   OT Duration  8 weeks    OT Treatment/Interventions  Self-care/ADL training;Fluidtherapy;Splinting;Therapeutic activities;Ultrasound;Therapeutic exercise;Scar mobilization;Cryotherapy;Passive range of motion;Manual Therapy;Patient/family education    Plan  assess ROM, strength, edema, pain , dexterity    OT Home Exercise Plan  see pt instruction    Consulted and Agree with Plan of Care  Patient       Patient will benefit from skilled therapeutic intervention in order to improve the following deficits and impairments:   Body Structure / Function / Physical Skills: ADL, Dexterity, ROM, Edema, Scar mobility, Sensation, Mobility, Skin integrity, Flexibility, Strength, Coordination, FMC, Pain, UE functional use       Visit Diagnosis: Pain in joint of left hand - Plan: Ot plan of care cert/re-cert  Other lack of coordination - Plan: Ot plan of care cert/re-cert  Muscle weakness (generalized) - Plan: Ot plan of care cert/re-cert  Localized edema - Plan: Ot plan of care cert/re-cert    Problem List There are no problems to display for this patient.   Rosalyn Gess OTR/L,CLT 01/10/2020, 5:57 PM  [facility] [address] [place], [address] Phone: [phone]   Fax:  [phone]  Name: Alexander Rojas MRN: [id number] Date of Birth:  Jan 17, 1956

## 2020-01-14 ENCOUNTER — Ambulatory Visit: Payer: 59 | Admitting: Occupational Therapy

## 2020-01-14 ENCOUNTER — Other Ambulatory Visit: Payer: Self-pay

## 2020-01-14 DIAGNOSIS — R600 Localized edema: Secondary | ICD-10-CM | POA: Diagnosis not present

## 2020-01-14 DIAGNOSIS — M6281 Muscle weakness (generalized): Secondary | ICD-10-CM

## 2020-01-14 DIAGNOSIS — R278 Other lack of coordination: Secondary | ICD-10-CM

## 2020-01-14 DIAGNOSIS — M25542 Pain in joints of left hand: Secondary | ICD-10-CM

## 2020-01-14 NOTE — Therapy (Signed)
[place] [address] [place], [address] Phone: [phone]   Fax:  [phone]  Occupational Therapy Treatment  Patient Details  Name: Alexander Rojas MRN: [id number] Date of Birth: 1956-06-01 Referring Provider (OT): Dr Steffanie Dunn. Pace   Encounter Date: 01/14/2020  OT End of Session - 01/14/20 1305    Visit Number  14    Number of Visits  18    Date for OT Re-Evaluation  03/06/20    OT Start Time  0740    OT Stop Time  0834    OT Time Calculation (min)  54 min    Activity Tolerance  Patient tolerated treatment well    Behavior During Therapy  [facility] for tasks assessed/performed       Past Medical History:  Diagnosis Date  . BPH (benign prostatic hyperplasia)   . Extensor tendon laceration, finger, open wound, initial encounter    left thumb  . Hypertension   . Hypothyroidism   . Macrocytosis     Past Surgical History:  Procedure Laterality Date  . COLONOSCOPY WITH PROPOFOL N/A 12/18/2018   Procedure: COLONOSCOPY WITH BIOPSY;  Surgeon: Lucilla Lame, MD;  Location: [place];  Service: Endoscopy;  Laterality: N/A;  NEEDS TO STAY FIRST  . ESOPHAGOGASTRODUODENOSCOPY    . ESOPHAGOGASTRODUODENOSCOPY (EGD) WITH PROPOFOL N/A 02/23/2019   Procedure: ESOPHAGOGASTRODUODENOSCOPY (EGD) WITH PROPOFOL;  Surgeon: Lucilla Lame, MD;  Location: [facility] ENDOSCOPY;  Service: Endoscopy;  Laterality: N/A;  . HERNIA REPAIR    . LIPOMA EXCISION Right    upper arm  . POLYPECTOMY N/A 12/18/2018   Procedure: POLYPECTOMY;  Surgeon: Lucilla Lame, MD;  Location: [place];  Service: Endoscopy;  Laterality: N/A;  . TENDON REPAIR Left 11/05/2019   Procedure: LEFT THUMB TENDON REPAIR;  Surgeon: Cindra Presume, MD;  Location: [place];  Service: Plastics;  Laterality: Left;  Axillary block, MAC  . UMBILICAL HERNIA REPAIR      There were no vitals filed for this visit.  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  Yes    Pain Score  2     Pain Location  Hand    Pain Orientation  Left    Pain Descriptors / Indicators  Tightness;Tender    Pain Type  Surgical pain    Pain Onset  More than a month ago    Pain Frequency  Intermittent         [facility] OT Assessment - 01/14/20 0001      Strength   Right Hand Grip (lbs)  100    Right Hand Lateral Pinch  27 lbs    Right Hand 3 Point Pinch  24 lbs    Left Hand Grip (lbs)  90    Left Hand Lateral Pinch  20 lbs    Left Hand 3 Point Pinch  16 lbs        Edema over MC and middle phalange on L increase  1 cm compare to R - and pt report this was not wrap since yesterday - am better and as day goes - increase - pt encourage to do coban every 2 hrs on and off - to keep flexibility and strength  - and see if can later do 3 hrs off and 2 hrs on        OT Treatments/Exercises (OP) - 01/14/20 0001      LUE Fluidotherapy   Number Minutes Fluidotherapy  11 Minutes    LUE Fluidotherapy Location  Hand;Wrist    Comments  with ice every 3 min to decrease edema and increase AROM        did decrease 0.4 cm in fluido with ice inbetween    Pt report he did some simulating of work activities and surgery - done in cadaver lab at [employer] some simulation- did about hour or so - done okay -to  increase act gradually Also when is thumb at the best with edema - in am and then maybe 2 hrs on and off  Contrast or compression to decrease edema  - and increase time and activities gradually to return to work   scar massage done with xtractor this date and vibration -with  flexion and extenion of thumb  Coban use and wife helps with that at home  [facility] extention stretch done and pt can do at home - to grasp objects that is very wide - like glass - CMC RA is WNL - but MC still limited in end range by scar adhesion      Reinforce again to think of options to return to work for surgery- First assist 25% for about week ,then increase 50% week , then 75% then 100% -  Then do 25% and have somebody assist him, and do same increase 25% a week - Do surgery in am when swelling is decrease and pm maybe office hours - do ice in between surgeries if needed  And also maybe every other day - 2-3 x week in beginning and increase gradually as thumb can tolerate and he feels more confidence   HOLD off on putty pinching - can do gripping and putty for thumb PA and RA and focus on IP extention - pt report he is       OT Education - 01/14/20 1305    Education Details  progress and HEP    Person(s) Educated  Patient    Methods  Explanation;Demonstration;Handout    Comprehension  Verbalized understanding;Returned demonstration       OT Short Term Goals - 01/10/20 1749      OT SHORT TERM GOAL #1   Title  Pt will Mod I splint use, care and precautions L thumb/wrist/forearm    Status  Achieved      OT SHORT TERM GOAL #2   Title  Pt wil lbe Mod  I scar management L dorsal thumb    Status  Achieved      OT SHORT TERM GOAL #3   Title  Pt will be Mod I HEP L thumb/wrist    Status  Achieved        OT Long Term Goals - 01/10/20 1749      OT LONG TERM GOAL #1   Title  Pt will be Mod I upgraded HEP & scar management L thumb    Status  Achieved      OT LONG TERM GOAL #2   Title  Pt will demonstrate functional flexion/A/ROM L  thumb as seen by ability to oppose thumb to all digits 1-5 and to the base of small finger in preparation of holding surgical tools    Status  Achieved      OT LONG TERM GOAL #3   Title  Pt will demonstrate functional active extension,  abduction and rotation L thumb as seen by goniometer measurement in preparation for return to work related tasks    Status  Achieved      OT LONG TERM GOAL #4   Title  Pt will demonstrate sensibility WFL's L thumb as indicated by semmes weinstein monofilaments assessment of 2.83 and subjective reports of decreased paresthesias dorsally.    Baseline  some parasthesias on dorsal thumb - but not volar - not affecting function    Status  Achieved      OT LONG TERM GOAL #5   Title  Pt scar adhesion improve for pt to have less edema in thumb and increase thumb ABD to grasp around wide objects    Baseline  Has to help to open thumb to grasp around wide jar , and edema increase after not wrap 2-3 hrs    Time  8    Period  Weeks    Status  New    Target Date  03/06/20            Plan - 01/14/20 1305    Clinical Impression Statement  Pt is about 10 wks s/p R thumb EPL and EPB repair - pt scar tissue still adhere and lmiting composite flexion and end range MC extention to grasp around wide object - but not limiting his functional use - 3 point grip increase this date - tenderness on volar IP decreasing - edem this date coming in 1 cm - pt encourage to coban thumb 2 hrs on and off - and not let it get worse than 1 cm - because of increase stiffness then  - cont to increase activity , simulation and where appropriate can start assisting with surgeries    OT Occupational Profile and History  Problem Focused Assessment - Including review of records relating to presenting problem    Occupational performance deficits (Please refer to evaluation for details):  ADL's;IADL's;Work    Body Structure / Function / Physical Skills  ADL;Dexterity;ROM;Edema;Scar mobility;Sensation;Mobility;Skin integrity;Flexibility;Strength;Coordination;FMC;Pain;UE functional use    Rehab Potential  Good    Clinical Decision Making  Limited treatment options, no task modification necessary    Comorbidities Affecting Occupational  Performance:  None    Modification or Assistance to Complete Evaluation   No modification of tasks or assist necessary to complete eval    OT Frequency  1x / week    OT Duration  6 weeks    OT Treatment/Interventions  Self-care/ADL training;Fluidtherapy;Splinting;Therapeutic activities;Ultrasound;Therapeutic exercise;Scar mobilization;Cryotherapy;Passive range of motion;Manual Therapy;Patient/family education    Plan  assess ROM, strength, edema, pain , dexterity    OT Home Exercise Plan  see pt instruction    Consulted and Agree with Plan of Care  Patient       Patient will benefit from skilled therapeutic intervention in order to improve the following deficits and impairments:   Body Structure / Function / Physical Skills: ADL, Dexterity, ROM, Edema, Scar mobility, Sensation, Mobility, Skin integrity, Flexibility, Strength, Coordination, FMC, Pain, UE functional use       Visit Diagnosis: Pain in joint of left hand  Other lack of coordination  Muscle weakness (generalized)  Localized edema    Problem List There are  no problems to display for this patient.   Rosalyn Gess OTR/L,CLT 01/14/2020, 1:10 PM  [place] [facility] [address] [place], [address] Phone: [phone]   Fax:  [phone]  Name: Alexander Rojas MRN: [id number] Date of Birth: 1956/01/31

## 2020-01-14 NOTE — Patient Instructions (Signed)
Same as last time - but do coban wrap L thumb every 2 hrs on and off with coban- so edema do no increase more than 1 cm

## 2020-01-24 ENCOUNTER — Other Ambulatory Visit: Payer: Self-pay

## 2020-01-24 ENCOUNTER — Ambulatory Visit: Payer: 59 | Attending: Plastic Surgery | Admitting: Occupational Therapy

## 2020-01-24 DIAGNOSIS — R278 Other lack of coordination: Secondary | ICD-10-CM | POA: Insufficient documentation

## 2020-01-24 DIAGNOSIS — M25542 Pain in joints of left hand: Secondary | ICD-10-CM | POA: Insufficient documentation

## 2020-01-24 DIAGNOSIS — M6281 Muscle weakness (generalized): Secondary | ICD-10-CM | POA: Diagnosis not present

## 2020-01-24 DIAGNOSIS — R600 Localized edema: Secondary | ICD-10-CM | POA: Insufficient documentation

## 2020-01-24 NOTE — Therapy (Signed)
[place] [facility] [address], [place], [address] Phone: [phone]   Fax:  [phone]  Occupational Therapy Treatment  Patient Details  Name: Alexander Rojas MRN: [id number] Date of Birth: 09-22-1956 Referring Provider (OT): Dr Steffanie Dunn. Pace   Encounter Date: 01/24/2020  OT End of Session - 01/24/20 1709    Visit Number  15    Number of Visits  18    Date for OT Re-Evaluation  03/06/20    OT Start Time  1550    OT Stop Time  1651    OT Time Calculation (min)  61 min    Activity Tolerance  Patient tolerated treatment well    Behavior During Therapy  [facility] for tasks assessed/performed       Past Medical History:  Diagnosis Date  . BPH (benign prostatic hyperplasia)   . Extensor tendon laceration, finger, open wound, initial encounter    left thumb  . Hypertension   . Hypothyroidism   . Macrocytosis     Past Surgical History:  Procedure Laterality Date  . COLONOSCOPY WITH PROPOFOL N/A 12/18/2018   Procedure: COLONOSCOPY WITH BIOPSY;  Surgeon: Lucilla Lame, MD;  Location: [place];  Service: Endoscopy;  Laterality: N/A;  NEEDS TO STAY FIRST  . ESOPHAGOGASTRODUODENOSCOPY    . ESOPHAGOGASTRODUODENOSCOPY (EGD) WITH PROPOFOL N/A 02/23/2019   Procedure: ESOPHAGOGASTRODUODENOSCOPY (EGD) WITH PROPOFOL;  Surgeon: Lucilla Lame, MD;  Location: [facility] ENDOSCOPY;  Service: Endoscopy;  Laterality: N/A;  . HERNIA REPAIR    . LIPOMA EXCISION Right    upper arm  . POLYPECTOMY N/A 12/18/2018   Procedure: POLYPECTOMY;  Surgeon: Lucilla Lame, MD;  Location: [place];  Service: Endoscopy;  Laterality: N/A;  . TENDON REPAIR Left 11/05/2019   Procedure: LEFT THUMB TENDON REPAIR;  Surgeon: Cindra Presume, MD;  Location: [place];  Service: Plastics;  Laterality: Left;  Axillary block, MAC  . UMBILICAL HERNIA REPAIR      There were no vitals filed for this visit.  Subjective Assessment -  01/24/20 1706    Subjective   The swelling is really good the last 3 days- but I have trouble with like flicking something with my thumb , that quick movement. I focus so hard with able to bend my thumb - that opening it and strengthening it that way I feel is my weak spot at the moment    Patient Stated Goals  Need my hand to use in surgery for clap - need to be able to make pinch grip- and use my hand in yardwork, fishing    Currently in Pain?  Yes    Pain Score  1     Pain Location  --   L thumb tip   Pain Orientation  Left    Pain Descriptors / Indicators  Tender         [facility] OT Assessment - 01/24/20 0001      Strength   Right Hand Lateral Pinch  27 lbs    Right Hand 3 Point Pinch  24 lbs    Left Hand Lateral Pinch  21 lbs    Left Hand 3 Point Pinch  16 lbs      Left Hand AROM   L Thumb MCP 0-60  60 Degrees   -15 scar adhesion   L Thumb IP 0-80  52 Degrees   neutral - R is hyper extention 25      Pt doing  well with edema this date - measuring about same at thumb [facility] and middle phalanges   tenderness on volar tip of thumb only - 1/10 - not pad Numbness improving per pt too on dorsal thumb But report feel he is slow with opposition to base of 5th with opposition and end strength into palm with thumb  As well as speed , dexterity like flicking his thumb into extention- and strength or stability at [facility]         OT Treatments/Exercises (OP) - 01/24/20 0001      LUE Fluidotherapy   Number Minutes Fluidotherapy  10 Minutes    LUE Fluidotherapy Location  Hand    Comments  AROM for thumb in all planes      scar massage done with focus using extractor and mini massager on both scars that is adhere with thumb into extention - and with flexion ,ext of thumb   this date address mostly focusing on : -MC extention during  RA with rubber band - place and hold and stabilizing CMC- yellow band = 5 lbs  -And then PA -place and hold 10 lbs with red band - but focus on MC stabilized  - and not "popping up " -Rolling ball of putty palm to tip of 4th and 5th and down to base of 5th and pinch into putty with thumb  10 reps Dexterity - - shuffling cards- using thumb base wide - stabilize at [facility] and use IP flexion moving into hyper extention -Safety pin open and close -Opening top of marker with thumb IP extention /flexion -Flicking ball of paper or marker rolling -with thumb to side on table OR ball of putty into air with thumb IP flexion then flick into extention         OT Education - 01/24/20 1708    Education Details  changes to HEP    Person(s) Educated  Patient    Methods  Explanation;Demonstration;Handout    Comprehension  Verbalized understanding;Returned demonstration;Verbal cues required;Need further instruction       OT Short Term Goals - 01/10/20 1749      OT SHORT TERM GOAL #1   Title  Pt will Mod I splint use, care and precautions L thumb/wrist/forearm    Status  Achieved      OT SHORT TERM GOAL #2   Title  Pt wil lbe Mod  I scar management L dorsal thumb    Status  Achieved      OT SHORT TERM GOAL #3   Title  Pt will be Mod I HEP L thumb/wrist    Status  Achieved        OT Long Term Goals - 01/10/20 1749      OT LONG TERM GOAL #1   Title  Pt will be Mod I upgraded HEP & scar management L thumb    Status  Achieved      OT LONG TERM GOAL #2   Title  Pt will demonstrate functional flexion/A/ROM L thumb as seen by ability to oppose thumb to all digits 1-5 and to the base of small finger in preparation of holding surgical tools    Status  Achieved      OT LONG TERM GOAL #3   Title  Pt will demonstrate functional active extension, abduction and rotation L thumb as seen by goniometer measurement in preparation for return to work related tasks    Status  Achieved      OT LONG TERM GOAL #4  Title  Pt will demonstrate sensibility WFL's L thumb as indicated by semmes weinstein monofilaments assessment of 2.83 and subjective reports of  decreased paresthesias dorsally.    Baseline  some parasthesias on dorsal thumb - but not volar - not affecting function    Status  Achieved      OT LONG TERM GOAL #5   Title  Pt scar adhesion improve for pt to have less edema in thumb and increase thumb ABD to grasp around wide objects    Baseline  Has to help to open thumb to grasp around wide jar , and edema increase after not wrap 2-3 hrs    Time  8    Period  Weeks    Status  New    Target Date  03/06/20            Plan - 01/24/20 1709    Clinical Impression Statement  Pt is about 11 1/2 wks s/p R thumb EPL and EPB repairt - pt last 15 degrees of MC extention still need strengthening and limited by scar adhesion - pt doing very well with dexterity , grip and pinch - but MC and IP extention end range, strength , speed , dexterity need stil some intervention - this date edema looking great -and tolerated scar massage well    OT Occupational Profile and History  Problem Focused Assessment - Including review of records relating to presenting problem    Occupational performance deficits (Please refer to evaluation for details):  ADL's;IADL's;Work    Body Structure / Function / Physical Skills  ADL;Dexterity;ROM;Edema;Scar mobility;Sensation;Mobility;Skin integrity;Flexibility;Strength;Coordination;FMC;Pain;UE functional use    Rehab Potential  Good    Clinical Decision Making  Limited treatment options, no task modification necessary    Comorbidities Affecting Occupational Performance:  None    Modification or Assistance to Complete Evaluation   No modification of tasks or assist necessary to complete eval    OT Frequency  2x / week    OT Duration  --   5 wks   OT Treatment/Interventions  Self-care/ADL training;Fluidtherapy;Splinting;Therapeutic activities;Ultrasound;Therapeutic exercise;Scar mobilization;Cryotherapy;Passive range of motion;Manual Therapy;Patient/family education    Plan  assess ROM, strength, edema, pain , dexterity     OT Home Exercise Plan  see pt instruction    Consulted and Agree with Plan of Care  Patient       Patient will benefit from skilled therapeutic intervention in order to improve the following deficits and impairments:   Body Structure / Function / Physical Skills: ADL, Dexterity, ROM, Edema, Scar mobility, Sensation, Mobility, Skin integrity, Flexibility, Strength, Coordination, FMC, Pain, UE functional use       Visit Diagnosis: Pain in joint of left hand  Other lack of coordination  Muscle weakness (generalized)  Localized edema    Problem List There are no problems to display for this patient.   Rosalyn Gess OTR/L,CLT 01/24/2020, 5:16 PM  [place] [facility] [address] [place], [address] Phone: [phone]   Fax:  [phone]  Name: DREON PINEDA MRN: [id number] Date of Birth: 1955-12-19

## 2020-01-24 NOTE — Patient Instructions (Signed)
See note

## 2020-01-28 ENCOUNTER — Ambulatory Visit: Payer: 59 | Admitting: Occupational Therapy

## 2020-01-28 ENCOUNTER — Other Ambulatory Visit: Payer: Self-pay

## 2020-01-28 DIAGNOSIS — M6281 Muscle weakness (generalized): Secondary | ICD-10-CM

## 2020-01-28 DIAGNOSIS — R600 Localized edema: Secondary | ICD-10-CM | POA: Diagnosis not present

## 2020-01-28 DIAGNOSIS — R278 Other lack of coordination: Secondary | ICD-10-CM | POA: Diagnosis not present

## 2020-01-28 DIAGNOSIS — M25542 Pain in joints of left hand: Secondary | ICD-10-CM

## 2020-01-28 NOTE — Therapy (Signed)
[place] [facility] [address] [place], [address] Phone: [phone]   Fax:  [phone]  Occupational Therapy Treatment  Patient Details  Name: Alexander Rojas MRN: [id number] Date of Birth: June 18, 1956 Referring Provider (OT): Dr Steffanie Dunn. Pace   Encounter Date: 01/28/2020  OT End of Session - 01/28/20 1325    Visit Number  16    Number of Visits  18    Date for OT Re-Evaluation  03/06/20    OT Start Time  0740    OT Stop Time  0828    OT Time Calculation (min)  48 min    Activity Tolerance  Patient tolerated treatment well    Behavior During Therapy  [facility] for tasks assessed/performed       Past Medical History:  Diagnosis Date  . BPH (benign prostatic hyperplasia)   . Extensor tendon laceration, finger, open wound, initial encounter    left thumb  . Hypertension   . Hypothyroidism   . Macrocytosis     Past Surgical History:  Procedure Laterality Date  . COLONOSCOPY WITH PROPOFOL N/A 12/18/2018   Procedure: COLONOSCOPY WITH BIOPSY;  Surgeon: Lucilla Lame, MD;  Location: [place];  Service: Endoscopy;  Laterality: N/A;  NEEDS TO STAY FIRST  . ESOPHAGOGASTRODUODENOSCOPY    . ESOPHAGOGASTRODUODENOSCOPY (EGD) WITH PROPOFOL N/A 02/23/2019   Procedure: ESOPHAGOGASTRODUODENOSCOPY (EGD) WITH PROPOFOL;  Surgeon: Lucilla Lame, MD;  Location: [facility] ENDOSCOPY;  Service: Endoscopy;  Laterality: N/A;  . HERNIA REPAIR    . LIPOMA EXCISION Right    upper arm  . POLYPECTOMY N/A 12/18/2018   Procedure: POLYPECTOMY;  Surgeon: Lucilla Lame, MD;  Location: [place];  Service: Endoscopy;  Laterality: N/A;  . TENDON REPAIR Left 11/05/2019   Procedure: LEFT THUMB TENDON REPAIR;  Surgeon: Cindra Presume, MD;  Location: [place];  Service: Plastics;  Laterality: Left;  Axillary block, MAC  . UMBILICAL HERNIA REPAIR      There were no vitals filed for this visit.  Subjective Assessment -  01/28/20 1322    Subjective   My swelling increase somewhat past week with increase exercises for extention - still hard time to open it wide - I have 3 surgeries lined up to assist in next week - in the am when swelling is down    Patient Stated Goals  Need my hand to use in surgery for clap - need to be able to make pinch grip- and use my hand in yardwork, fishing    Currently in Pain?  Yes    Pain Score  1     Pain Location  Finger (Comment which one)    Pain Orientation  Left    Pain Descriptors / Indicators  Tender;Aching    Pain Type  Surgical pain    Pain Onset  More than a month ago    Pain Frequency  Intermittent      assess; MC extention during  RA with rubber band - place and hold and stabilizing CMC- yellow band = 5 lbs  -And then PA -place and hold 10 lbs with red band - but focus on MC stabilized - and not "popping up " -Rolling ball of putty palm to tip of 4th and 5th and down to base of 5th and pinch into putty with thumb  10 reps  Pt to cont with and focus on end range for [facility] - not put band on IP - leverage  to long  Add IP and MC block over table - and place and hold 1 rubber band - and then AROM against rubber band  Opposition out of palm - pt to do quality and use IP , MC of thumb flexion , ext to retrieve object out of palm to tips And add weight bearing on thumb and digits against wall for stabilization - can do 30 sec to 1 min - 3-5 x - tapping - MC and IP to stay straight  Putty upgrade add med firm 1/2 green to teal for pulling and twisting - 15 rps  And taking small objects out using precision pinch               OT Treatments/Exercises (OP) - 01/28/20 0001      LUE Fluidotherapy   Number Minutes Fluidotherapy  8 Minutes    LUE Fluidotherapy Location  Hand    Comments  AROM and hand function exercises              OT Education - 01/28/20 1324    Education Details  changes to HEP    Person(s) Educated  Patient    Comprehension   Verbalized understanding;Returned demonstration;Verbal cues required;Need further instruction       OT Short Term Goals - 01/10/20 1749      OT SHORT TERM GOAL #1   Title  Pt will Mod I splint use, care and precautions L thumb/wrist/forearm    Status  Achieved      OT SHORT TERM GOAL #2   Title  Pt wil lbe Mod  I scar management L dorsal thumb    Status  Achieved      OT SHORT TERM GOAL #3   Title  Pt will be Mod I HEP L thumb/wrist    Status  Achieved        OT Long Term Goals - 01/10/20 1749      OT LONG TERM GOAL #1   Title  Pt will be Mod I upgraded HEP & scar management L thumb    Status  Achieved      OT LONG TERM GOAL #2   Title  Pt will demonstrate functional flexion/A/ROM L thumb as seen by ability to oppose thumb to all digits 1-5 and to the base of small finger in preparation of holding surgical tools    Status  Achieved      OT LONG TERM GOAL #3   Title  Pt will demonstrate functional active extension, abduction and rotation L thumb as seen by goniometer measurement in preparation for return to work related tasks    Status  Achieved      OT LONG TERM GOAL #4   Title  Pt will demonstrate sensibility WFL's L thumb as indicated by semmes weinstein monofilaments assessment of 2.83 and subjective reports of decreased paresthesias dorsally.    Baseline  some parasthesias on dorsal thumb - but not volar - not affecting function    Status  Achieved      OT LONG TERM GOAL #5   Title  Pt scar adhesion improve for pt to have less edema in thumb and increase thumb ABD to grasp around wide objects    Baseline  Has to help to open thumb to grasp around wide jar , and edema increase after not wrap 2-3 hrs    Time  8    Period  Weeks    Status  New    Target Date  03/06/20            Plan - 01/28/20 1326    Clinical Impression Statement  Pt is 12 wks s/p R thumb EPL and EPB repair - pt to focus on strengthening end range for IP and MC of thumb , working on  handfunction for using thumb IP and MC flexion , ext during opposition from palm to digits manipulation - weight bearing stabilization exerice for thumb ,and increase resistance to putty for precision pinch , twisting , and pulling    OT Occupational Profile and History  Problem Focused Assessment - Including review of records relating to presenting problem    Occupational performance deficits (Please refer to evaluation for details):  ADL's;IADL's;Work    Body Structure / Function / Physical Skills  ADL;Dexterity;ROM;Edema;Scar mobility;Sensation;Mobility;Skin integrity;Flexibility;Strength;Coordination;FMC;Pain;UE functional use    Rehab Potential  Good    Clinical Decision Making  Limited treatment options, no task modification necessary    Comorbidities Affecting Occupational Performance:  None    Modification or Assistance to Complete Evaluation   No modification of tasks or assist necessary to complete eval    OT Frequency  1x / week    OT Duration  4 weeks    OT Treatment/Interventions  Self-care/ADL training;Fluidtherapy;Splinting;Therapeutic activities;Ultrasound;Therapeutic exercise;Scar mobilization;Cryotherapy;Passive range of motion;Manual Therapy;Patient/family education    Plan  assess ROM, strength, edema, pain , dexterity    OT Home Exercise Plan  see pt instruction    Consulted and Agree with Plan of Care  Patient       Patient will benefit from skilled therapeutic intervention in order to improve the following deficits and impairments:   Body Structure / Function / Physical Skills: ADL, Dexterity, ROM, Edema, Scar mobility, Sensation, Mobility, Skin integrity, Flexibility, Strength, Coordination, FMC, Pain, UE functional use       Visit Diagnosis: Pain in joint of left hand  Other lack of coordination  Muscle weakness (generalized)  Localized edema    Problem List There are no problems to display for this patient.   Rosalyn Gess OTR/L,CLT 01/28/2020, 1:29  PM  [place] [facility] [address], [place], [address] Phone: [phone]   Fax:  [phone]  Name: Alexander Rojas MRN: [id number] Date of Birth: May 07, 1956

## 2020-01-28 NOTE — Patient Instructions (Signed)
See note

## 2020-02-02 ENCOUNTER — Ambulatory Visit: Payer: 59 | Admitting: Occupational Therapy

## 2020-02-02 ENCOUNTER — Other Ambulatory Visit: Payer: Self-pay

## 2020-02-02 DIAGNOSIS — R278 Other lack of coordination: Secondary | ICD-10-CM

## 2020-02-02 DIAGNOSIS — R600 Localized edema: Secondary | ICD-10-CM | POA: Diagnosis not present

## 2020-02-02 DIAGNOSIS — M25542 Pain in joints of left hand: Secondary | ICD-10-CM

## 2020-02-02 DIAGNOSIS — M6281 Muscle weakness (generalized): Secondary | ICD-10-CM | POA: Diagnosis not present

## 2020-02-02 NOTE — Patient Instructions (Signed)
See note

## 2020-02-02 NOTE — Therapy (Signed)
[place] [facility] [address] [place], [address] Phone: [phone]   Fax:  [phone]  Occupational Therapy Treatment  Patient Details  Name: Alexander Rojas MRN: [id number] Date of Birth: 1956-04-06 Referring Provider (OT): Dr Steffanie Dunn. Pace   Encounter Date: 02/02/2020  OT End of Session - 02/02/20 1741    Visit Number  17    Number of Visits  18    Date for OT Re-Evaluation  03/06/20    Authorization Type  Pt is [place]    OT Start Time  1630    OT Stop Time  1731    OT Time Calculation (min)  61 min    Activity Tolerance  Patient tolerated treatment well    Behavior During Therapy  [facility] for tasks assessed/performed       Past Medical History:  Diagnosis Date  . BPH (benign prostatic hyperplasia)   . Extensor tendon laceration, finger, open wound, initial encounter    left thumb  . Hypertension   . Hypothyroidism   . Macrocytosis     Past Surgical History:  Procedure Laterality Date  . COLONOSCOPY WITH PROPOFOL N/A 12/18/2018   Procedure: COLONOSCOPY WITH BIOPSY;  Surgeon: Lucilla Lame, MD;  Location: [place];  Service: Endoscopy;  Laterality: N/A;  NEEDS TO STAY FIRST  . ESOPHAGOGASTRODUODENOSCOPY    . ESOPHAGOGASTRODUODENOSCOPY (EGD) WITH PROPOFOL N/A 02/23/2019   Procedure: ESOPHAGOGASTRODUODENOSCOPY (EGD) WITH PROPOFOL;  Surgeon: Lucilla Lame, MD;  Location: [facility] ENDOSCOPY;  Service: Endoscopy;  Laterality: N/A;  . HERNIA REPAIR    . LIPOMA EXCISION Right    upper arm  . POLYPECTOMY N/A 12/18/2018   Procedure: POLYPECTOMY;  Surgeon: Lucilla Lame, MD;  Location: [place];  Service: Endoscopy;  Laterality: N/A;  . TENDON REPAIR Left 11/05/2019   Procedure: LEFT THUMB TENDON REPAIR;  Surgeon: Cindra Presume, MD;  Location: [place];  Service: Plastics;  Laterality: Left;  Axillary block, MAC  . UMBILICAL HERNIA REPAIR       There were no vitals filed for this visit.  Subjective Assessment - 02/02/20 1739    Subjective   I did some assistance of surgeries this week - 2 x and tomorrow again - did okay - was able to do most every thing - it was little hard to stretch my thumb and palpate around the colon - had to move my hand or step around to different spot - but it felt good to be in the OR again    Patient Stated Goals  Need my hand to use in surgery for clap - need to be able to make pinch grip- and use my hand in yardwork, fishing    Currently in Pain?  No/denies         [facility] OT Assessment - 02/02/20 0001      Left Hand AROM   L Thumb MCP 0-60  60 Degrees   -10   L Thumb IP 0-80  50 Degrees   -20   L Thumb Radial ADduction/ABduction 0-55  --   65   L Thumb Palmar ADduction/ABduction 0-45  70    L Thumb Opposition to Index  --   base of 5th      Assess AROM of thumb IP , MC , PA and RA extention and strengthening - still decrease in [facility] and IP  As well as decrease stabiliization at  MC - WNL at [facility]  Pt fatigue during day and IP and MC extention and PA, RA decrease - fabricate web spacer for night time use for [facility] and IP extention to stretch at night time - can use coban to wrap at night time  Start tomorrow night - going to be stiff in am and has surgery in the morning  Scar still adhere   Pt assisted in couple of surgeries this week and done very well - was able to do everything - but had to compensate with palpation around the colon - to stretch webspace or step around to position his body to pt - but was able to do it          OT Treatments/Exercises (OP) - 02/02/20 0001      LUE Fluidotherapy   Number Minutes Fluidotherapy  12 Minutes    LUE Fluidotherapy Location  Hand    Comments  AROM and dexterity for thumb in all planes prior to scar massage and PROM      scar massage done using vibration - with thumb extention and flexion -done xtractor - but vibration worked better- pt to  do over weekend kinesiotaping over proximal scar - X and use during AROM and functional tasks - try now that his skin is not as tender    assess; Review and add IP and MC extention block with rubber band  PROM for IP flexion but MC neutral - but check extention lag inbetween  stabilization of MC - middle of thumb in all directions- CMC WNL - but longer the lever weaker   Opposition out of palm - pt to do quality and use IP , MC of thumb flexion , ext to retrieve object out of palm to tips And  Cont weight bearing on thumb and digits against wall for stabilization - can do 30 sec to 1 min - 3-5 x - tapping - MC and IP to stay straight  Last time Putty upgrade add med firm 1/2 green to teal for pulling and twisting - 15 rps  And taking small objects out using precision pinch  - pt report it is harder than it looks        OT Education - 02/02/20 1740    Education Details  webspacer wearing , IP and MC of thumb strengthening - stabilzation of MC and IP flexion PROM    Person(s) Educated  Patient    Methods  Explanation;Demonstration;Handout    Comprehension  Verbalized understanding;Returned demonstration;Verbal cues required;Need further instruction       OT Short Term Goals - 01/10/20 1749      OT SHORT TERM GOAL #1   Title  Pt will Mod I splint use, care and precautions L thumb/wrist/forearm    Status  Achieved      OT SHORT TERM GOAL #2   Title  Pt wil lbe Mod  I scar management L dorsal thumb    Status  Achieved      OT SHORT TERM GOAL #3   Title  Pt will be Mod I HEP L thumb/wrist    Status  Achieved        OT Long Term Goals - 01/10/20 1749      OT LONG TERM GOAL #1   Title  Pt will be Mod I upgraded HEP & scar management L thumb    Status  Achieved      OT LONG TERM GOAL #2   Title  Pt will demonstrate  functional flexion/A/ROM L thumb as seen by ability to oppose thumb to all digits 1-5 and to the base of small finger in preparation of holding surgical tools     Status  Achieved      OT LONG TERM GOAL #3   Title  Pt will demonstrate functional active extension, abduction and rotation L thumb as seen by goniometer measurement in preparation for return to work related tasks    Status  Achieved      OT LONG TERM GOAL #4   Title  Pt will demonstrate sensibility WFL's L thumb as indicated by semmes weinstein monofilaments assessment of 2.83 and subjective reports of decreased paresthesias dorsally.    Baseline  some parasthesias on dorsal thumb - but not volar - not affecting function    Status  Achieved      OT LONG TERM GOAL #5   Title  Pt scar adhesion improve for pt to have less edema in thumb and increase thumb ABD to grasp around wide objects    Baseline  Has to help to open thumb to grasp around wide jar , and edema increase after not wrap 2-3 hrs    Time  8    Period  Weeks    Status  New    Target Date  03/06/20            Plan - 02/02/20 1742    Clinical Impression Statement  Pt is about 13 wks s/p R thumb EPL and EPB repair - pt show improvement of 5 degrees at [facility] and IP of thumb the last 2 wks - still weak at [facility] and IP extention and stabilization , scar tissue still adhere for flexion of MC and IP, and composite flexion ,ext , and quick snapping and fliicking - cont to work on mention limitations    OT Occupational Profile and History  Problem Focused Assessment - Including review of records relating to presenting problem    Occupational performance deficits (Please refer to evaluation for details):  ADL's;IADL's;Work    Body Structure / Function / Physical Skills  ADL;Dexterity;ROM;Edema;Scar mobility;Sensation;Mobility;Skin integrity;Flexibility;Strength;Coordination;FMC;Pain;UE functional use    Rehab Potential  Good    Clinical Decision Making  Limited treatment options, no task modification necessary    Comorbidities Affecting Occupational Performance:  None    Modification or Assistance to Complete Evaluation   No  modification of tasks or assist necessary to complete eval    OT Frequency  1x / week    OT Duration  2 weeks    OT Treatment/Interventions  Self-care/ADL training;Fluidtherapy;Splinting;Therapeutic activities;Ultrasound;Therapeutic exercise;Scar mobilization;Cryotherapy;Passive range of motion;Manual Therapy;Patient/family education    Plan  assess ROM, strength, scar tissue, edema, pain , dexterity    OT Home Exercise Plan  see pt instruction    Consulted and Agree with Plan of Care  Patient       Patient will benefit from skilled therapeutic intervention in order to improve the following deficits and impairments:   Body Structure / Function / Physical Skills: ADL, Dexterity, ROM, Edema, Scar mobility, Sensation, Mobility, Skin integrity, Flexibility, Strength, Coordination, FMC, Pain, UE functional use       Visit Diagnosis: Pain in joint of left hand  Other lack of coordination  Muscle weakness (generalized)  Localized edema    Problem List There are no problems to display for this patient.   Rosalyn Gess OTR/L,CLT 02/02/2020, 5:45 PM  [place] [facility] [address] [place],  [place], [address] Phone: [phone]   Fax:  [phone]  Name: BRITTAIN HOSIE MRN: [id number] Date of Birth: 1956-09-12

## 2020-02-09 ENCOUNTER — Ambulatory Visit: Payer: 59 | Admitting: Occupational Therapy

## 2020-02-09 ENCOUNTER — Other Ambulatory Visit: Payer: Self-pay

## 2020-02-09 DIAGNOSIS — R278 Other lack of coordination: Secondary | ICD-10-CM | POA: Diagnosis not present

## 2020-02-09 DIAGNOSIS — R600 Localized edema: Secondary | ICD-10-CM

## 2020-02-09 DIAGNOSIS — M25542 Pain in joints of left hand: Secondary | ICD-10-CM | POA: Diagnosis not present

## 2020-02-09 DIAGNOSIS — M6281 Muscle weakness (generalized): Secondary | ICD-10-CM | POA: Diagnosis not present

## 2020-02-09 NOTE — Therapy (Signed)
[facility] [address] [place], [address] Phone: [phone]   Fax:  [phone]  Occupational Therapy Treatment  Patient Details  Name: Alexander Rojas MRN: [id number] Date of Birth: 10-20-1956 Referring Provider (OT): Dr Steffanie Dunn. Pace   Encounter Date: 02/09/2020  OT End of Session - 02/09/20 1747    Visit Number  18    Number of Visits  22    Date for OT Re-Evaluation  03/06/20    OT Start Time  1630    OT Stop Time  1730    OT Time Calculation (min)  60 min    Activity Tolerance  Patient tolerated treatment well    Behavior During Therapy  [facility] for tasks assessed/performed       Past Medical History:  Diagnosis Date  . BPH (benign prostatic hyperplasia)   . Extensor tendon laceration, finger, open wound, initial encounter    left thumb  . Hypertension   . Hypothyroidism   . Macrocytosis     Past Surgical History:  Procedure Laterality Date  . COLONOSCOPY WITH PROPOFOL N/A 12/18/2018   Procedure: COLONOSCOPY WITH BIOPSY;  Surgeon: Lucilla Lame, MD;  Location: [place];  Service: Endoscopy;  Laterality: N/A;  NEEDS TO STAY FIRST  . ESOPHAGOGASTRODUODENOSCOPY    . ESOPHAGOGASTRODUODENOSCOPY (EGD) WITH PROPOFOL N/A 02/23/2019   Procedure: ESOPHAGOGASTRODUODENOSCOPY (EGD) WITH PROPOFOL;  Surgeon: Lucilla Lame, MD;  Location: [facility] ENDOSCOPY;  Service: Endoscopy;  Laterality: N/A;  . HERNIA REPAIR    . LIPOMA EXCISION Right    upper arm  . POLYPECTOMY N/A 12/18/2018   Procedure: POLYPECTOMY;  Surgeon: Lucilla Lame, MD;  Location: [place];  Service: Endoscopy;  Laterality: N/A;  . TENDON REPAIR Left 11/05/2019   Procedure: LEFT THUMB TENDON REPAIR;  Surgeon: Cindra Presume, MD;  Location: [place];  Service: Plastics;  Laterality: Left;  Axillary block, MAC  . UMBILICAL HERNIA REPAIR      There were no vitals filed for this visit.  Subjective Assessment -  02/09/20 1745    Subjective   I done some surgeries and was able to do most everything - except wide grip still - but I can change my body or hand to feel or palpate things - still feel like I am loosing webspace -but sit at night with the webspacer to stretch    Patient Stated Goals  Need my hand to use in surgery for clap - need to be able to make pinch grip- and use my hand in yardwork, fishing    Currently in Pain?  No/denies         [facility] OT Assessment - 02/09/20 0001      Strength   Right Hand Lateral Pinch  27 lbs    Right Hand 3 Point Pinch  24 lbs    Left Hand Lateral Pinch  22 lbs    Left Hand 3 Point Pinch  18 lbs      Left Hand AROM   L Thumb MCP 0-60  60 Degrees   -10   L Thumb IP 0-80  50 Degrees   0 coming in - in session fatigue -10      Lat and 3 point pinch increase since last time taken  IP extention increase coming in this date  [facility] extention same  pt feels like loosing thumb extention - but appear for OT more fatigue as the day progress thumb rest more  in flexion, scar tissue limiting full extention  Edema decreasing - Pt did some surgeries this week - done well - assisting still   Stability at thumb MC and CMC good - but IP decrease - pt to do some tapping on IP        OT Treatments/Exercises (OP) - 02/09/20 0001      LUE Fluidotherapy   Number Minutes Fluidotherapy  12 Minutes    LUE Fluidotherapy Location  Hand    Comments  AROM for thumb in all planes        scar massage done using vibration - with thumb extention and flexion -done xtractor - but vibration worked better- pt to do over weekend kinesiotaping over distal scar - X and use during AROM and functional tasks - try now that his skin is not as tender    assess; Review and add to do yellow band - about 5 lbs to IP now during PA and RA    PROM for IP flexion but MC neutral - but check extention lag inbetween  stabilization of IP now  - up and down on IP and sideways too if needed     Opposition out of palm - pt to do quality and use IP , MC of thumb flexion , ext to retrieve object out of palm to tips And  Cont weight bearing on thumb and digits against wall for stabilization - can do 30 sec to 1 min - 3-5 x - tapping - MC and IP to stay straight  Upgrade to firm green putty for lat grip  And taking small objects out using precision pinch        OT Education - 02/09/20 1747    Education Details  webspacer wearing , IP and MC of thumb strengthening - stabilization IP  of thumb , precision pinch and lat grip in green putty    Person(s) Educated  Patient    Methods  Explanation;Demonstration;Handout    Comprehension  Verbalized understanding;Returned demonstration;Verbal cues required;Need further instruction       OT Short Term Goals - 01/10/20 1749      OT SHORT TERM GOAL #1   Title  Pt will Mod I splint use, care and precautions L thumb/wrist/forearm    Status  Achieved      OT SHORT TERM GOAL #2   Title  Pt wil lbe Mod  I scar management L dorsal thumb    Status  Achieved      OT SHORT TERM GOAL #3   Title  Pt will be Mod I HEP L thumb/wrist    Status  Achieved        OT Long Term Goals - 01/10/20 1749      OT LONG TERM GOAL #1   Title  Pt will be Mod I upgraded HEP & scar management L thumb    Status  Achieved      OT LONG TERM GOAL #2   Title  Pt will demonstrate functional flexion/A/ROM L thumb as seen by ability to oppose thumb to all digits 1-5 and to the base of small finger in preparation of holding surgical tools    Status  Achieved      OT LONG TERM GOAL #3   Title  Pt will demonstrate functional active extension, abduction and rotation L thumb as seen by goniometer measurement in preparation for return to work related tasks    Status  Achieved      OT LONG TERM  GOAL #4   Title  Pt will demonstrate sensibility WFL's L thumb as indicated by semmes weinstein monofilaments assessment of 2.83 and subjective reports of decreased  paresthesias dorsally.    Baseline  some parasthesias on dorsal thumb - but not volar - not affecting function    Status  Achieved      OT LONG TERM GOAL #5   Title  Pt scar adhesion improve for pt to have less edema in thumb and increase thumb ABD to grasp around wide objects    Baseline  Has to help to open thumb to grasp around wide jar , and edema increase after not wrap 2-3 hrs    Time  8    Period  Weeks    Status  New    Target Date  03/06/20            Plan - 02/09/20 1749    Clinical Impression Statement  Pt is 14 wks s/p R thumb EPL and EPB repair - pt cont to show increase strength  this date in lat and 3 point pinch , IP extention increase compare to last time - but still decrease and fatigue during day and after PROM to IP flexion , pt to cont to strenghen PA and RA but resistance on IP of thumb , scar tissue , and precition pinch strength    OT Occupational Profile and History  Problem Focused Assessment - Including review of records relating to presenting problem    Occupational performance deficits (Please refer to evaluation for details):  ADL's;IADL's;Work    Body Structure / Function / Physical Skills  ADL;Dexterity;ROM;Edema;Scar mobility;Sensation;Mobility;Skin integrity;Flexibility;Strength;Coordination;FMC;Pain;UE functional use    Rehab Potential  Good    Clinical Decision Making  Limited treatment options, no task modification necessary    Comorbidities Affecting Occupational Performance:  None    Modification or Assistance to Complete Evaluation   No modification of tasks or assist necessary to complete eval    OT Frequency  1x / week    OT Duration  4 weeks    OT Treatment/Interventions  Self-care/ADL training;Fluidtherapy;Splinting;Therapeutic activities;Ultrasound;Therapeutic exercise;Scar mobilization;Cryotherapy;Passive range of motion;Manual Therapy;Patient/family education    Plan  assess ROM, strength, scar tissue, edema, pain , dexterity    Consulted  and Agree with Plan of Care  Patient       Patient will benefit from skilled therapeutic intervention in order to improve the following deficits and impairments:   Body Structure / Function / Physical Skills: ADL, Dexterity, ROM, Edema, Scar mobility, Sensation, Mobility, Skin integrity, Flexibility, Strength, Coordination, FMC, Pain, UE functional use       Visit Diagnosis: Pain in joint of left hand  Other lack of coordination  Muscle weakness (generalized)  Localized edema    Problem List There are no problems to display for this patient.   Rosalyn Gess OTR/L,CLT 02/09/2020, 5:53 PM  [facility] [address] [place], [address] Phone: [phone]   Fax:  [phone]  Name: LYNX GOODRICH MRN: [id number] Date of Birth: 21-Dec-1955

## 2020-02-09 NOTE — Patient Instructions (Signed)
See note

## 2020-02-16 ENCOUNTER — Ambulatory Visit: Payer: 59 | Admitting: Occupational Therapy

## 2020-02-23 ENCOUNTER — Ambulatory Visit: Payer: 59 | Attending: Plastic Surgery | Admitting: Occupational Therapy

## 2020-02-23 ENCOUNTER — Other Ambulatory Visit: Payer: Self-pay

## 2020-02-23 DIAGNOSIS — M25542 Pain in joints of left hand: Secondary | ICD-10-CM | POA: Diagnosis not present

## 2020-02-23 DIAGNOSIS — R278 Other lack of coordination: Secondary | ICD-10-CM | POA: Insufficient documentation

## 2020-02-23 DIAGNOSIS — R600 Localized edema: Secondary | ICD-10-CM | POA: Diagnosis not present

## 2020-02-23 DIAGNOSIS — M6281 Muscle weakness (generalized): Secondary | ICD-10-CM | POA: Diagnosis not present

## 2020-02-23 NOTE — Patient Instructions (Signed)
See note

## 2020-02-23 NOTE — Therapy (Signed)
[facility] [address] [place], [address] Phone: [phone]   Fax:  [phone]  Occupational Therapy Treatment  Patient Details  Name: Alexander Rojas MRN: [id number] Date of Birth: 15-Aug-1956 Referring Provider (OT): Dr Steffanie Dunn. Pace   Encounter Date: 02/23/2020  OT End of Session - 02/23/20 1732    Visit Number  19    Number of Visits  22    Date for OT Re-Evaluation  03/06/20    OT Start Time  1630    OT Stop Time  1725    OT Time Calculation (min)  55 min    Activity Tolerance  Patient tolerated treatment well    Behavior During Therapy  WFL for tasks assessed/performed       Past Medical History:  Diagnosis Date  . BPH (benign prostatic hyperplasia)   . Extensor tendon laceration, finger, open wound, initial encounter    left thumb  . Hypertension   . Hypothyroidism   . Macrocytosis     Past Surgical History:  Procedure Laterality Date  . COLONOSCOPY WITH PROPOFOL N/A 12/18/2018   Procedure: COLONOSCOPY WITH BIOPSY;  Surgeon: Lucilla Lame, MD;  Location: [place];  Service: Endoscopy;  Laterality: N/A;  NEEDS TO STAY FIRST  . ESOPHAGOGASTRODUODENOSCOPY    . ESOPHAGOGASTRODUODENOSCOPY (EGD) WITH PROPOFOL N/A 02/23/2019   Procedure: ESOPHAGOGASTRODUODENOSCOPY (EGD) WITH PROPOFOL;  Surgeon: Lucilla Lame, MD;  Location: [facility] ENDOSCOPY;  Service: Endoscopy;  Laterality: N/A;  . HERNIA REPAIR    . LIPOMA EXCISION Right    upper arm  . POLYPECTOMY N/A 12/18/2018   Procedure: POLYPECTOMY;  Surgeon: Lucilla Lame, MD;  Location: [place];  Service: Endoscopy;  Laterality: N/A;  . TENDON REPAIR Left 11/05/2019   Procedure: LEFT THUMB TENDON REPAIR;  Surgeon: Cindra Presume, MD;  Location: [place];  Service: Plastics;  Laterality: Left;  Axillary block, MAC  . UMBILICAL HERNIA REPAIR      There were no vitals filed for this visit.  Subjective Assessment -  02/23/20 1730    Subjective   We are busy with surgeries and I am doing them by myself now- able to do everything else - but still feels like I am loosing spreading my thumb open -and then little pain in the capsule all the time    Patient Stated Goals  Need my hand to use in surgery for clap - need to be able to make pinch grip- and use my hand in yardwork, fishing    Currently in Pain?  Yes    Pain Location  Hand    Pain Orientation  Left    Pain Descriptors / Indicators  Aching    Pain Type  Surgical pain    Pain Onset  More than a month ago    Pain Frequency  Constant           Lat pinch increase since last time taken  IP extention and MC extention same  pt feels like loosing thumb RA  - but appear for OT more fatigue as the day progress, increase tightness of soft tissue , scar tissue and fatigue combination - and flexors 50x stronger than extensors   Webspace on R 80 and L 55 coming in - but after some PROM and in session was able to do AROM to 70 degrees Edema decreasing - Pt did some surgeries this week - done well - done some on his own  webspacer not staying in place - cannot sleep with it           OT Treatments/Exercises (OP) - 02/23/20 0001      LUE Fluidotherapy   Number Minutes Fluidotherapy  10 Minutes    LUE Fluidotherapy Location  Hand    Comments  AROM for thumb prior to scar massage         scar massage done using vibration - with thumb extention and flexion - vibration worked better- pt to do over weekend kinesiotaping over distal scar - X and use during AROM and functional tasks - try now that his skin is not as tender  cont with yellow band - about 5 lbs to IP now during PA and RA    PROM for IP flexion but MC neutral - but check extention lag inbetween stabilization of IP now  - up and down on IP and sideways too if needed    Opposition out of palm - pt to do quality and use IP , MC of thumb flexion , ext to retrieve object out of palm  to tips AndContweight bearing on thumb and digits against wall for stabilization - can do 30 sec to 1 min - 3-5 x - tapping - MC and IP to stay straight  Upgrade to firm green putty for lat grip last time And taking small objects out using precision pinch  Fabricated new webspace for pt but wider to stay in place with velcro around hand and thumb - to wear night time and then inbetween surgeries and meeting - do passive stretch with other hand       OT Education - 02/23/20 1732    Education Details  webspacer wearing , IP and MC of thumb strengthening - stabilization IP  of thumb , precision pinch and lat grip in green putty    Person(s) Educated  Patient    Methods  Explanation;Demonstration;Handout    Comprehension  Verbalized understanding;Returned demonstration;Verbal cues required;Need further instruction       OT Short Term Goals - 01/10/20 1749      OT SHORT TERM GOAL #1   Title  Pt will Mod I splint use, care and precautions L thumb/wrist/forearm    Status  Achieved      OT SHORT TERM GOAL #2   Title  Pt wil lbe Mod  I scar management L dorsal thumb    Status  Achieved      OT SHORT TERM GOAL #3   Title  Pt will be Mod I HEP L thumb/wrist    Status  Achieved        OT Long Term Goals - 01/10/20 1749      OT LONG TERM GOAL #1   Title  Pt will be Mod I upgraded HEP & scar management L thumb    Status  Achieved      OT LONG TERM GOAL #2   Title  Pt will demonstrate functional flexion/A/ROM L thumb as seen by ability to oppose thumb to all digits 1-5 and to the base of small finger in preparation of holding surgical tools    Status  Achieved      OT LONG TERM GOAL #3   Title  Pt will demonstrate functional active extension, abduction and rotation L thumb as seen by goniometer measurement in preparation for return to work related tasks    Status  Achieved      OT LONG TERM GOAL #4   Title  Pt will demonstrate sensibility WFL's L thumb as indicated by  semmes weinstein monofilaments assessment of 2.83 and subjective reports of decreased paresthesias dorsally.    Baseline  some parasthesias on dorsal thumb - but not volar - not affecting function    Status  Achieved      OT LONG TERM GOAL #5   Title  Pt scar adhesion improve for pt to have less edema in thumb and increase thumb ABD to grasp around wide objects    Baseline  Has to help to open thumb to grasp around wide jar , and edema increase after not wrap 2-3 hrs    Time  8    Period  Weeks    Status  New    Target Date  03/06/20            Plan - 02/23/20 1732    Clinical Impression Statement  Pt is about 4 months s/p R thumb EPL and EPB repair- pt show increase lat grip and use of thumb - but do show some decrease webspace opening - MC and IP measure about same -but loosing webspace stretch -remade webspacer wider to stay in place better- pt to cont with same strenghtening but use webspacer at night time and do passive stretch inbetween surgeries to mainain motion until strength increase and less fatigue - follow up in 3 wks    OT Occupational Profile and History  Problem Focused Assessment - Including review of records relating to presenting problem    Occupational performance deficits (Please refer to evaluation for details):  ADL's;IADL's;Work    Body Structure / Function / Physical Skills  ADL;Dexterity;ROM;Edema;Scar mobility;Sensation;Mobility;Skin integrity;Flexibility;Strength;Coordination;FMC;Pain;UE functional use    Rehab Potential  Good    Clinical Decision Making  Limited treatment options, no task modification necessary    Comorbidities Affecting Occupational Performance:  None    Modification or Assistance to Complete Evaluation   No modification of tasks or assist necessary to complete eval    OT Frequency  --   3 wks   OT Duration  --   3 wks   OT Treatment/Interventions  Self-care/ADL training;Fluidtherapy;Splinting;Therapeutic activities;Ultrasound;Therapeutic  exercise;Scar mobilization;Cryotherapy;Passive range of motion;Manual Therapy;Patient/family education    Plan  assess ROM, strength, scar tissue, edema, pain , dexterity    OT Home Exercise Plan  see pt instruction    Consulted and Agree with Plan of Care  Patient       Patient will benefit from skilled therapeutic intervention in order to improve the following deficits and impairments:   Body Structure / Function / Physical Skills: ADL, Dexterity, ROM, Edema, Scar mobility, Sensation, Mobility, Skin integrity, Flexibility, Strength, Coordination, FMC, Pain, UE functional use       Visit Diagnosis: Pain in joint of left hand  Other lack of coordination  Muscle weakness (generalized)  Localized edema    Problem List There are no problems to display for this patient.   Rosalyn Gess OTR/l,CLT 02/23/2020, 5:36 PM  [place] [address], [place], [address] Phone: [phone]   Fax:  [phone]  Name: ALPHONZA TRAMELL MRN: [id number] Date of Birth: 06-20-1956

## 2020-03-15 ENCOUNTER — Ambulatory Visit: Payer: 59 | Admitting: Occupational Therapy

## 2020-03-16 ENCOUNTER — Ambulatory Visit: Payer: 59 | Admitting: Occupational Therapy

## 2020-04-03 ENCOUNTER — Other Ambulatory Visit: Payer: Self-pay

## 2020-04-03 ENCOUNTER — Ambulatory Visit: Payer: 59 | Attending: Plastic Surgery | Admitting: Occupational Therapy

## 2020-04-03 DIAGNOSIS — R278 Other lack of coordination: Secondary | ICD-10-CM | POA: Insufficient documentation

## 2020-04-03 DIAGNOSIS — M6281 Muscle weakness (generalized): Secondary | ICD-10-CM | POA: Insufficient documentation

## 2020-04-03 DIAGNOSIS — R600 Localized edema: Secondary | ICD-10-CM | POA: Insufficient documentation

## 2020-04-03 DIAGNOSIS — M25542 Pain in joints of left hand: Secondary | ICD-10-CM | POA: Insufficient documentation

## 2020-04-03 NOTE — Therapy (Signed)
[facility] [address] [place], [address] Phone: [phone]   Fax:  [phone]  Occupational Therapy Treatment  Patient Details  Name: Alexander Rojas MRN: [id number] Date of Birth: [birth year] Referring Provider (OT): Dr Steffanie Dunn. Pace   Encounter Date: 04/03/2020   OT End of Session - 04/03/20 1509    Visit Number 20    Number of Visits 22    Date for OT Re-Evaluation 05/29/20    Authorization Type Pt is [place]    OT Start Time 1423    OT Stop Time 1450    OT Time Calculation (min) 27 min    Activity Tolerance Patient tolerated treatment well           Past Medical History:  Diagnosis Date  . BPH (benign prostatic hyperplasia)   . Extensor tendon laceration, finger, open wound, initial encounter    left thumb  . Hypertension   . Hypothyroidism   . Macrocytosis     Past Surgical History:  Procedure Laterality Date  . COLONOSCOPY WITH PROPOFOL N/A 12/18/2018   Procedure: COLONOSCOPY WITH BIOPSY;  Surgeon: Lucilla Lame, MD;  Location: [facility];  Service: Endoscopy;  Laterality: N/A;  NEEDS TO STAY FIRST  . ESOPHAGOGASTRODUODENOSCOPY    . ESOPHAGOGASTRODUODENOSCOPY (EGD) WITH PROPOFOL N/A 02/23/2019   Procedure: ESOPHAGOGASTRODUODENOSCOPY (EGD) WITH PROPOFOL;  Surgeon: Lucilla Lame, MD;  Location: [facility] ENDOSCOPY;  Service: Endoscopy;  Laterality: N/A;  . HERNIA REPAIR    . LIPOMA EXCISION Right    upper arm  . POLYPECTOMY N/A 12/18/2018   Procedure: POLYPECTOMY;  Surgeon: Lucilla Lame, MD;  Location: [facility];  Service: Endoscopy;  Laterality: N/A;  . TENDON REPAIR Left 11/05/2019   Procedure: LEFT THUMB TENDON REPAIR;  Surgeon: Cindra Presume, MD;  Location: [facility];  Service: Plastics;  Laterality: Left;  Axillary block, MAC  . UMBILICAL HERNIA REPAIR      There were no vitals filed for this visit.    Subjective Assessment - 04/03/20 1505    Subjective  I am doing all my surgeries by myself - no issues - but it is stiff in the morning- takes about  a hour to work it out - but then I am good- still sleeping with the webspacer - still ones to go in as the day goes - and then my fine pinch to open package or hold my shoe lace - still weak    Patient Stated Goals Need my hand to use in surgery for clap - need to be able to make pinch grip- and use my hand in yardwork, fishing    Currently in Pain? Yes    Pain Score 1     Pain Location --   thumb   Pain Orientation Left    Pain Descriptors / Indicators Aching    Pain Type Acute pain    Pain Onset More than a month ago    Pain Frequency Constant              [facility] OT Assessment - 04/03/20 0001      Strength   Right Hand Grip (lbs) 95    Right Hand Lateral Pinch 26 lbs    Right Hand 3 Point Pinch 20 lbs   2 point IP flexion 12(16)   Left Hand Grip (lbs) 95    Left Hand Lateral Pinch 21 lbs  Left Hand 3 Point Pinch 14 lbs   2 point pinch IP flexion 9 (15)     Left Hand AROM   L Thumb MCP 0-60 65 Degrees    L Thumb IP 0-80 50 Degrees    L Thumb Opposition to Index --   Opposition to base of 5th          Pt able to do his surgeries with no issues - on his own and 4 hrs at time  Stiffness mostly in the am -and takes about hour to work out  Gradually during day - thumbs ones to loose RA - but able this date to open it -and PROM WNL compare to R side  And he has web spacer he wear during night time   Do report his pinch - hard time opening small packages and holding shoelaces Can do knots with surgery - because he use 2nd and 3rd digits  Pinches was decrease -and when doing precision pinch - he moves into hyper extention - for loading  Upgrade his putty to firm dark blue -and pt to do lat , 3 point pinch ( hyper extention of IP) And Flexion of IP with precision - to focus on  Can contact me in month to reassess                    OT Education - 04/03/20 1509    Education Details wearing of webspacer - and upgrade to dark firm blue for lat , 3 poin grip and 2 point - precision grip    Person(s) Educated Patient    Methods Explanation;Demonstration;Handout    Comprehension Verbalized understanding;Returned demonstration;Verbal cues required;Need further instruction            OT Short Term Goals - 01/10/20 1749      OT SHORT TERM GOAL #1   Title Pt will Mod I splint use, care and precautions L thumb/wrist/forearm    Status Achieved      OT SHORT TERM GOAL #2   Title Pt wil lbe Mod  I scar management L dorsal thumb    Status Achieved      OT SHORT TERM GOAL #3   Title Pt will be Mod I HEP L thumb/wrist    Status Achieved             OT Long Term Goals - 04/03/20 1516      OT LONG TERM GOAL #1   Title Pt will be Mod I upgraded HEP & scar management L thumb    Status Achieved      OT LONG TERM GOAL #2   Title Pt will demonstrate functional flexion/A/ROM L thumb as seen by ability to oppose thumb to all digits 1-5 and to the base of small finger in preparation of holding surgical tools    Status Achieved      OT LONG TERM GOAL #3   Title Pt will demonstrate functional active extension, abduction and rotation L thumb as seen by goniometer measurement in preparation for return to work related tasks    Status Achieved      OT LONG TERM GOAL #4   Title Pt will demonstrate sensibility WFL's L thumb as indicated by semmes weinstein monofilaments assessment of 2.83 and subjective reports of decreased paresthesias dorsally.    Status Achieved      OT LONG TERM GOAL #5   Title Pt scar adhesion improve for pt to have less edema in thumb and  increase thumb ABD to grasp around wide objects    Baseline pt using webspace at night time - and able to get actively webspace open - still stiffness in am but issues with edema better    Status Achieved                 Plan  - 04/03/20 1510    Clinical Impression Statement Pt is about 5 1/2 months s/p R thumb EPL and EPB repair -doing very well functionally - but still show decrease prehension grip -with precision grip using IP flexion of thumb to open package or hold shoelace the worse - upgrade putty this date to dark blue firm. Pt also to cont with webspacer at night time until he has the strength and stamina to maintain RA and PA during day- pt to contact me in month if need to follow up    OT Occupational Profile and History Problem Focused Assessment - Including review of records relating to presenting problem    Occupational performance deficits (Please refer to evaluation for details): ADL's;IADL's;Work    Body Structure / Function / Physical Skills ADL;Dexterity;ROM;Edema;Scar mobility;Sensation;Mobility;Skin integrity;Flexibility;Strength;Coordination;FMC;Pain;UE functional use    Rehab Potential Good    Clinical Decision Making Limited treatment options, no task modification necessary    Comorbidities Affecting Occupational Performance: None    Modification or Assistance to Complete Evaluation  No modification of tasks or assist necessary to complete eval    OT Frequency Monthly    OT Duration 8 weeks    OT Treatment/Interventions Self-care/ADL training;Fluidtherapy;Splinting;Therapeutic activities;Ultrasound;Therapeutic exercise;Scar mobilization;Cryotherapy;Passive range of motion;Manual Therapy;Patient/family education    Plan progress in strengthening , stiffness    OT Home Exercise Plan see pt instruction           Patient will benefit from skilled therapeutic intervention in order to improve the following deficits and impairments:   Body Structure / Function / Physical Skills: ADL, Dexterity, ROM, Edema, Scar mobility, Sensation, Mobility, Skin integrity, Flexibility, Strength, Coordination, FMC, Pain, UE functional use       Visit Diagnosis: Pain in joint of left hand - Plan: Ot plan of care  cert/re-cert  Other lack of coordination - Plan: Ot plan of care cert/re-cert  Muscle weakness (generalized) - Plan: Ot plan of care cert/re-cert  Localized edema - Plan: Ot plan of care cert/re-cert    Problem List There are no problems to display for this patient.   Rosalyn Gess OTR/L,CLT 04/03/2020, 4:22 PM  [facility] [address], [place], [address] Phone: [phone]   Fax:  [phone]  Name: HARDING THOMURE MRN: [id number] Date of Birth: 11-28-1955

## 2020-04-03 NOTE — Patient Instructions (Signed)
See note

## 2020-05-16 DIAGNOSIS — Z7982 Long term (current) use of aspirin: Secondary | ICD-10-CM | POA: Diagnosis not present

## 2020-05-16 DIAGNOSIS — I10 Essential (primary) hypertension: Secondary | ICD-10-CM | POA: Diagnosis not present

## 2020-05-24 ENCOUNTER — Other Ambulatory Visit: Payer: Self-pay | Admitting: Internal Medicine

## 2020-05-24 DIAGNOSIS — R200 Anesthesia of skin: Secondary | ICD-10-CM

## 2020-06-19 ENCOUNTER — Other Ambulatory Visit: Payer: Self-pay

## 2020-06-19 ENCOUNTER — Ambulatory Visit
Admission: RE | Admit: 2020-06-19 | Discharge: 2020-06-19 | Disposition: A | Discharge status: Home / Self Care | Payer: 59 | Source: Ambulatory Visit | Attending: Internal Medicine | Admitting: Internal Medicine

## 2020-06-19 DIAGNOSIS — R200 Anesthesia of skin: Secondary | ICD-10-CM

## 2020-06-19 DIAGNOSIS — M48061 Spinal stenosis, lumbar region without neurogenic claudication: Secondary | ICD-10-CM | POA: Diagnosis not present

## 2020-07-10 DIAGNOSIS — H524 Presbyopia: Secondary | ICD-10-CM | POA: Diagnosis not present

## 2020-07-11 DIAGNOSIS — M4310 Spondylolisthesis, site unspecified: Secondary | ICD-10-CM | POA: Diagnosis not present

## 2020-07-11 DIAGNOSIS — M4300 Spondylolysis, site unspecified: Secondary | ICD-10-CM | POA: Diagnosis not present

## 2020-07-12 ENCOUNTER — Other Ambulatory Visit: Payer: Self-pay | Admitting: Student

## 2020-07-12 DIAGNOSIS — M4300 Spondylolysis, site unspecified: Secondary | ICD-10-CM

## 2020-07-12 DIAGNOSIS — M4310 Spondylolisthesis, site unspecified: Secondary | ICD-10-CM

## 2020-07-21 ENCOUNTER — Other Ambulatory Visit: Payer: Self-pay

## 2020-07-21 ENCOUNTER — Other Ambulatory Visit: Payer: 59

## 2020-07-25 ENCOUNTER — Other Ambulatory Visit: Payer: Self-pay | Admitting: Neurological Surgery

## 2020-07-28 ENCOUNTER — Ambulatory Visit
Admission: RE | Admit: 2020-07-28 | Discharge: 2020-07-28 | Disposition: A | Discharge status: Home / Self Care | Payer: 59 | Source: Ambulatory Visit | Attending: Student | Admitting: Student

## 2020-07-28 DIAGNOSIS — M4300 Spondylolysis, site unspecified: Secondary | ICD-10-CM

## 2020-07-28 DIAGNOSIS — M4807 Spinal stenosis, lumbosacral region: Secondary | ICD-10-CM | POA: Diagnosis not present

## 2020-07-28 DIAGNOSIS — M4310 Spondylolisthesis, site unspecified: Secondary | ICD-10-CM

## 2020-07-31 ENCOUNTER — Other Ambulatory Visit: Payer: Self-pay

## 2020-07-31 ENCOUNTER — Encounter: Payer: Self-pay | Admitting: Vascular Surgery

## 2020-07-31 ENCOUNTER — Ambulatory Visit (INDEPENDENT_AMBULATORY_CARE_PROVIDER_SITE_OTHER): Payer: 59 | Admitting: Vascular Surgery

## 2020-07-31 DIAGNOSIS — M5137 Other intervertebral disc degeneration, lumbosacral region: Secondary | ICD-10-CM

## 2020-07-31 NOTE — Progress Notes (Signed)
    Virtual Visit via Telephone Note  Referring MD: Sherley Bounds, MD  I connected with Alexander Rojas on 07/31/2020  by telephone and verified that I was speaking with the correct persons.     PCP: Adin Hector, MD   Chief Complaint: Degenerative disc disease  History of Present Illness: Alexander Rojas is a 64 y.o. male with with lumbar degenerative disc disease.  Has seen Dr. Sherley Bounds in consultation and has had recommendation for fusion of L5-S1.  He has recommended anterior approach.  Past Medical History:  Diagnosis Date  . Back pain   . BPH (benign prostatic hyperplasia)   . Extensor tendon laceration, finger, open wound, initial encounter    left thumb  . Hypertension   . Hypothyroidism   . Macrocytosis   . Numbness and tingling   . Pars defect with spondylolisthesis     Past Surgical History:  Procedure Laterality Date  . COLONOSCOPY WITH PROPOFOL N/A 12/18/2018   Procedure: COLONOSCOPY WITH BIOPSY;  Surgeon: Lucilla Lame, MD;  Location: [facility];  Service: Endoscopy;  Laterality: N/A;  NEEDS TO STAY FIRST  . ESOPHAGOGASTRODUODENOSCOPY    . ESOPHAGOGASTRODUODENOSCOPY (EGD) WITH PROPOFOL N/A 02/23/2019   Procedure: ESOPHAGOGASTRODUODENOSCOPY (EGD) WITH PROPOFOL;  Surgeon: Lucilla Lame, MD;  Location: [facility] ENDOSCOPY;  Service: Endoscopy;  Laterality: N/A;  . HERNIA REPAIR    . LIPOMA EXCISION Right    upper arm  . POLYPECTOMY N/A 12/18/2018   Procedure: POLYPECTOMY;  Surgeon: Lucilla Lame, MD;  Location: [facility];  Service: Endoscopy;  Laterality: N/A;  . TENDON REPAIR Left 11/05/2019   Procedure: LEFT THUMB TENDON REPAIR;  Surgeon: Cindra Presume, MD;  Location: [facility];  Service: Plastics;  Laterality: Left;  Axillary block, MAC  . UMBILICAL HERNIA REPAIR      No outpatient medications have been marked as taking for the 07/31/20 encounter (Office Visit) with Rosetta Posner, MD.       Observations/Objective: CT  lumbar films were reviewed from 08/23/2020.  This shows normal location of the bifurcation.  He has minimal atherosclerotic change  Assessment and Plan: I discussed my role with Dr. Genevive Bi.  Explained that Dr. Ronnald Ramp has recommended surgery for treatment of his degenerative disc disease and that a portion of this will be from anterior approach.  Explained the technical aspects of the procedure including mobilization of the rectus muscle, left ureter, intraperitoneal contents, arterial venous structures overlying the spine.  Discussed potential injury to these.  I do not see any contraindications to the approach.  He is not obese.  He has minimal atherosclerotic change in his aortoiliac segments.  His only prior abdominal surgery was umbilical hernia repair which should not come into play.  Follow Up Instructions:   Follow up plan for L5-S1 ALIF   I discussed the assessment and treatment plan with the patient. The patient was provided an opportunity to ask questions and all were answered. The patient agreed with the plan and demonstrated an understanding of the instructions.   I spent 5-10 minutes with the patient via telephone encounter.   Annamary Rummage [facility] Office: [phone]  07/31/2020, 4:17 PM

## 2020-08-21 ENCOUNTER — Other Ambulatory Visit: Payer: Self-pay | Admitting: Internal Medicine

## 2020-09-13 NOTE — Pre-Procedure Instructions (Signed)
[place] [place] [address] Phone: [phone] Fax: [phone]    Your procedure is scheduled on Mon, Nov. 29, 2021 from 7:30AM-12:29PM.  Report to [facility] Entrance "A" at 5:30AM  Call this number if you have problems the morning of surgery:  [phone]   Remember:  Do not eat or drink after midnight on Nov. 28th    Take these medicines the morning of surgery with A SIP OF WATER: Levothyroxine (SYNTHROID)      Pantoprazole (PROTONIX)   As of today, STOP taking all Aspirin (unless instructed by your doctor) and Other Aspirin containing products, Vitamins, Fish oils, and Herbal medications. Also stop all NSAIDS i.e. Advil, Ibuprofen, Motrin, Aleve, Anaprox, Naproxen, BC, Goody Powders, and all Supplements.   No Smoking of any kind, Tobacco/Vaping, or Alcohol products 24 hours prior to your procedure. If you use a Cpap at night, you may bring all equipment for your overnight stay.   Special instructions: [facility]- Preparing For Surgery  Before surgery, you can play an important role. Because skin is not sterile, your skin needs to be as free of germs as possible. You can reduce the number of germs on your skin by washing with CHG (chlorahexidine gluconate) Soap before surgery.  CHG is an antiseptic cleaner which kills germs and bonds with the skin to continue killing germs even after washing.    Please do not use if you have an allergy to CHG or antibacterial soaps. If your skin becomes reddened/irritated stop using the CHG.  Do not shave (including legs and underarms) for at least 48 hours prior to first CHG shower. It is OK to shave your face.  Please follow these instructions carefully.   1. Shower the NIGHT BEFORE SURGERY and the MORNING OF SURGERY with CHG.   2. If you chose to wash your hair, wash your hair first as usual with your normal shampoo.  3. After you  shampoo, rinse your hair and body thoroughly to remove the shampoo.  4. Use CHG as you would any other liquid soap. You can apply CHG directly to the skin and wash gently with a scrungie or a clean washcloth.   5. Apply the CHG Soap to your body ONLY FROM THE NECK DOWN.  Do not use on open wounds or open sores. Avoid contact with your eyes, ears, mouth and genitals (private parts). Wash Face and genitals (private parts)  with your normal soap.  6. Wash thoroughly, paying special attention to the area where your surgery will be performed.  7. Thoroughly rinse your body with warm water from the neck down.  8. DO NOT shower/wash with your normal soap after using and rinsing off the CHG Soap.  9. Pat yourself dry with a CLEAN TOWEL.  10. Wear CLEAN PAJAMAS to bed the night before surgery, wear comfortable clothes the morning of surgery  11. Place CLEAN SHEETS on your bed the night of your first shower and DO NOT SLEEP WITH PETS.   Day of Surgery:             Remember to brush your teeth WITH YOUR REGULAR TOOTHPASTE.  Do not wear jewelry.  Do not wear lotions, powders, colognes, or deodorant.  Do not shave 48 hours prior to surgery.  Men may shave face and neck.  Do not bring valuables to the hospital.  [facility] is not responsible for any belongings or  valuables.  Contacts, dentures or bridgework may not be worn into surgery.   For patients admitted to the hospital, discharge time will be determined by your treatment team.  Patients discharged the day of surgery will not be allowed to drive home, and someone age 62 and over needs to stay with them for 24 hours.  Please wear clean clothes to the hospital/surgery center.    Please read over the following fact sheets that you were given.

## 2020-09-15 ENCOUNTER — Other Ambulatory Visit (HOSPITAL_COMMUNITY)
Admission: RE | Admit: 2020-09-15 | Discharge: 2020-09-15 | Disposition: A | Discharge status: Home / Self Care | Payer: 59 | Source: Ambulatory Visit | Attending: Neurological Surgery | Admitting: Neurological Surgery

## 2020-09-15 ENCOUNTER — Encounter (HOSPITAL_COMMUNITY)
Admission: RE | Admit: 2020-09-15 | Discharge: 2020-09-15 | Disposition: A | Discharge status: Home / Self Care | Payer: 59 | Source: Ambulatory Visit | Attending: Neurological Surgery | Admitting: Neurological Surgery

## 2020-09-15 ENCOUNTER — Other Ambulatory Visit: Payer: Self-pay

## 2020-09-15 ENCOUNTER — Encounter (HOSPITAL_COMMUNITY): Payer: Self-pay

## 2020-09-15 DIAGNOSIS — Z01812 Encounter for preprocedural laboratory examination: Secondary | ICD-10-CM | POA: Insufficient documentation

## 2020-09-15 DIAGNOSIS — M4310 Spondylolisthesis, site unspecified: Secondary | ICD-10-CM | POA: Insufficient documentation

## 2020-09-15 DIAGNOSIS — Z01818 Encounter for other preprocedural examination: Secondary | ICD-10-CM | POA: Diagnosis not present

## 2020-09-15 DIAGNOSIS — Z20822 Contact with and (suspected) exposure to covid-19: Secondary | ICD-10-CM | POA: Insufficient documentation

## 2020-09-15 LAB — SURGICAL PCR SCREEN
MRSA, PCR: NEGATIVE
Staphylococcus aureus: NEGATIVE

## 2020-09-15 LAB — BASIC METABOLIC PANEL
Anion gap: 8 (ref 5–15)
BUN: 18 mg/dL (ref 8–23)
CO2: 24 mmol/L (ref 22–32)
Calcium: 8.8 mg/dL — ABNORMAL LOW (ref 8.9–10.3)
Chloride: 108 mmol/L (ref 98–111)
Creatinine, Ser: 0.88 mg/dL (ref 0.61–1.24)
GFR, Estimated: >60 mL/min (ref >60)
Glucose, Bld: 110 mg/dL — ABNORMAL HIGH (ref 70–99)
Potassium: 3.6 mmol/L (ref 3.5–5.1)
Sodium: 140 mmol/L (ref 135–145)

## 2020-09-15 LAB — CBC WITH DIFFERENTIAL/PLATELET
Abs Immature Granulocytes: 0.03 10*3/uL (ref 0.00–0.07)
Basophils Absolute: 0.1 10*3/uL (ref 0.0–0.1)
Basophils Relative: 2 %
Eosinophils Absolute: 0.2 10*3/uL (ref 0.0–0.5)
Eosinophils Relative: 4 %
HCT: 44.7 % (ref 39.0–52.0)
Hemoglobin: 15.1 g/dL (ref 13.0–17.0)
Immature Granulocytes: 1 %
Lymphocytes Relative: 35 %
Lymphs Abs: 2 10*3/uL (ref 0.7–4.0)
MCH: 34.5 pg — ABNORMAL HIGH (ref 26.0–34.0)
MCHC: 33.8 g/dL (ref 30.0–36.0)
MCV: 102.1 fL — ABNORMAL HIGH (ref 80.0–100.0)
Monocytes Absolute: 0.5 10*3/uL (ref 0.1–1.0)
Monocytes Relative: 8 %
Neutro Abs: 2.8 10*3/uL (ref 1.7–7.7)
Neutrophils Relative %: 50 %
Platelets: 220 10*3/uL (ref 150–400)
RBC: 4.38 MIL/uL (ref 4.22–5.81)
RDW: 12.2 % (ref 11.5–15.5)
WBC: 5.6 10*3/uL (ref 4.0–10.5)
nRBC: 0 % (ref 0.0–0.2)

## 2020-09-15 LAB — PROTIME-INR
INR: 1 (ref 0.8–1.2)
Prothrombin Time: 12.5 seconds (ref 11.4–15.2)

## 2020-09-15 LAB — SARS CORONAVIRUS 2 (TAT 6-24 HRS): SARS Coronavirus 2: NEGATIVE

## 2020-09-15 NOTE — Progress Notes (Addendum)
PCP - [facility] Lab Cardiologist - denies  PPM/ICD - denies   Chest x-ray - 09/15/20 EKG - 11/05/19 Stress Test - denies ECHO - denies Cardiac Cath - denies  Sleep Study - denies  Patient instructed to hold all Aspirin, NSAID's, herbal medications, fish oil and vitamins 7 days prior to surgery.   ERAS Protcol -no   COVID TEST- 09/15/20 0955   Anesthesia review: no  Patient denies shortness of breath, fever, cough and chest pain at PAT appointment   All instructions explained to the patient, with a verbal understanding of the material. Patient agrees to go over the instructions while at home for a better understanding. Patient also instructed to self quarantine after being tested for COVID-19. The opportunity to ask questions was provided.

## 2020-09-15 NOTE — Pre-Procedure Instructions (Signed)
[place], [place] [address] Phone: [phone] Fax: [phone]    Your procedure is scheduled on Tuesday, Nov. 30, 2021 from 7:30AM-12:29PM.  Report to [facility] Entrance "A" at 5:30AM  Call this number if you have problems the morning of surgery:  [phone]   Remember:  Do not eat or drink after midnight on Nov. 28th    Take these medicines the morning of surgery with A SIP OF WATER: Levothyroxine (SYNTHROID)      Pantoprazole (PROTONIX)   As of today, STOP taking all Aspirin (unless instructed by your doctor) and Other Aspirin containing products, Vitamins, Fish oils, and Herbal medications. Also stop all NSAIDS i.e. Advil, Ibuprofen, Motrin, Aleve, Anaprox, Naproxen, BC, Goody Powders, and all Supplements.   No Smoking of any kind, Tobacco/Vaping, or Alcohol products 24 hours prior to your procedure. If you use a Cpap at night, you may bring all equipment for your overnight stay.   Special instructions: [facility]- Preparing For Surgery  Before surgery, you can play an important role. Because skin is not sterile, your skin needs to be as free of germs as possible. You can reduce the number of germs on your skin by washing with CHG (chlorahexidine gluconate) Soap before surgery.  CHG is an antiseptic cleaner which kills germs and bonds with the skin to continue killing germs even after washing.    Please do not use if you have an allergy to CHG or antibacterial soaps. If your skin becomes reddened/irritated stop using the CHG.  Do not shave (including legs and underarms) for at least 48 hours prior to first CHG shower. It is OK to shave your face.  Please follow these instructions carefully.   1. Shower the NIGHT BEFORE SURGERY and the MORNING OF SURGERY with CHG.   2. If you chose to wash your hair, wash your hair first as usual with your normal shampoo.  3. After you  shampoo, rinse your hair and body thoroughly to remove the shampoo.  4. Use CHG as you would any other liquid soap. You can apply CHG directly to the skin and wash gently with a scrungie or a clean washcloth.   5. Apply the CHG Soap to your body ONLY FROM THE NECK DOWN.  Do not use on open wounds or open sores. Avoid contact with your eyes, ears, mouth and genitals (private parts). Wash Face and genitals (private parts)  with your normal soap.  6. Wash thoroughly, paying special attention to the area where your surgery will be performed.  7. Thoroughly rinse your body with warm water from the neck down.  8. DO NOT shower/wash with your normal soap after using and rinsing off the CHG Soap.  9. Pat yourself dry with a CLEAN TOWEL.  10. Wear CLEAN PAJAMAS to bed the night before surgery, wear comfortable clothes the morning of surgery  11. Place CLEAN SHEETS on your bed the night of your first shower and DO NOT SLEEP WITH PETS.   Day of Surgery:             Remember to brush your teeth WITH YOUR REGULAR TOOTHPASTE.  Do not wear jewelry.  Do not wear lotions, powders, colognes, or deodorant.  Do not shave 48 hours prior to surgery.  Men may shave face and neck.  Do not bring valuables to the hospital.  [facility] is not responsible for any belongings or  valuables.  Contacts, dentures or bridgework may not be worn into surgery.   For patients admitted to the hospital, discharge time will be determined by your treatment team.  Patients discharged the day of surgery will not be allowed to drive home, and someone age 62 and over needs to stay with them for 24 hours.  Please wear clean clothes to the hospital/surgery center.    Please read over the following fact sheets that you were given.

## 2020-09-19 ENCOUNTER — Other Ambulatory Visit: Payer: Self-pay

## 2020-09-19 ENCOUNTER — Inpatient Hospital Stay (HOSPITAL_COMMUNITY): Payer: 59 | Admitting: Anesthesiology

## 2020-09-19 ENCOUNTER — Encounter (HOSPITAL_COMMUNITY)
Admission: RE | Disposition: A | Discharge status: Home / Self Care | Payer: Self-pay | Source: Home / Self Care | Attending: Neurological Surgery

## 2020-09-19 ENCOUNTER — Inpatient Hospital Stay (HOSPITAL_COMMUNITY): Payer: 59

## 2020-09-19 ENCOUNTER — Encounter (HOSPITAL_COMMUNITY): Payer: Self-pay | Admitting: Neurological Surgery

## 2020-09-19 ENCOUNTER — Other Ambulatory Visit: Payer: Self-pay | Admitting: Neurological Surgery

## 2020-09-19 ENCOUNTER — Inpatient Hospital Stay (HOSPITAL_COMMUNITY)
Admission: RE | Admit: 2020-09-19 | Discharge: 2020-09-19 | DRG: 455 | Disposition: A | Discharge status: Home / Self Care | Payer: 59 | Attending: Neurological Surgery | Admitting: Neurological Surgery

## 2020-09-19 DIAGNOSIS — Z79890 Hormone replacement therapy (postmenopausal): Secondary | ICD-10-CM

## 2020-09-19 DIAGNOSIS — N400 Enlarged prostate without lower urinary tract symptoms: Secondary | ICD-10-CM | POA: Diagnosis present

## 2020-09-19 DIAGNOSIS — M4317 Spondylolisthesis, lumbosacral region: Secondary | ICD-10-CM

## 2020-09-19 DIAGNOSIS — M4326 Fusion of spine, lumbar region: Secondary | ICD-10-CM | POA: Diagnosis not present

## 2020-09-19 DIAGNOSIS — E034 Atrophy of thyroid (acquired): Secondary | ICD-10-CM | POA: Diagnosis not present

## 2020-09-19 DIAGNOSIS — Z79899 Other long term (current) drug therapy: Secondary | ICD-10-CM

## 2020-09-19 DIAGNOSIS — M47816 Spondylosis without myelopathy or radiculopathy, lumbar region: Secondary | ICD-10-CM | POA: Diagnosis not present

## 2020-09-19 DIAGNOSIS — Z981 Arthrodesis status: Secondary | ICD-10-CM | POA: Diagnosis not present

## 2020-09-19 DIAGNOSIS — M5136 Other intervertebral disc degeneration, lumbar region: Secondary | ICD-10-CM | POA: Diagnosis not present

## 2020-09-19 DIAGNOSIS — I10 Essential (primary) hypertension: Secondary | ICD-10-CM | POA: Diagnosis not present

## 2020-09-19 DIAGNOSIS — E039 Hypothyroidism, unspecified: Secondary | ICD-10-CM | POA: Diagnosis not present

## 2020-09-19 DIAGNOSIS — M2578 Osteophyte, vertebrae: Secondary | ICD-10-CM | POA: Diagnosis present

## 2020-09-19 DIAGNOSIS — Z419 Encounter for procedure for purposes other than remedying health state, unspecified: Secondary | ICD-10-CM

## 2020-09-19 DIAGNOSIS — M4807 Spinal stenosis, lumbosacral region: Secondary | ICD-10-CM | POA: Diagnosis present

## 2020-09-19 HISTORY — PX: ANTERIOR LUMBAR FUSION: SHX1170

## 2020-09-19 HISTORY — PX: ABDOMINAL EXPOSURE: SHX5708

## 2020-09-19 HISTORY — PX: LAMINECTOMY WITH POSTERIOR LATERAL ARTHRODESIS LEVEL 1: SHX6335

## 2020-09-19 LAB — ABO/RH: ABO/RH(D): AB POS

## 2020-09-19 SURGERY — ANTERIOR LUMBAR FUSION 1 LEVEL
Anesthesia: General | Site: Spine Lumbar

## 2020-09-19 MED ORDER — CELECOXIB 200 MG PO CAPS: 200.0000 mg | ORAL_CAPSULE | Freq: Two times a day (BID) | ORAL | 0 refills | Status: AC

## 2020-09-19 MED ORDER — LACTATED RINGERS IV SOLN: INTRAVENOUS | Status: DC | PRN

## 2020-09-19 MED ORDER — DEXAMETHASONE SODIUM PHOSPHATE 10 MG/ML IJ SOLN
INTRAMUSCULAR | Status: AC
  Filled 2020-09-19: qty 1

## 2020-09-19 MED ORDER — PANTOPRAZOLE SODIUM 40 MG PO TBEC: 40.0000 mg | DELAYED_RELEASE_TABLET | Freq: Every day | ORAL | Status: DC

## 2020-09-19 MED ORDER — BUPIVACAINE HCL (PF) 0.25 % IJ SOLN
INTRAMUSCULAR | Status: AC
  Filled 2020-09-19: qty 30

## 2020-09-19 MED ORDER — CHLORHEXIDINE GLUCONATE CLOTH 2 % EX PADS: 6.0000 | MEDICATED_PAD | Freq: Once | CUTANEOUS | Status: DC

## 2020-09-19 MED ORDER — LIDOCAINE 2% (20 MG/ML) 5 ML SYRINGE
INTRAMUSCULAR | Status: DC | PRN
  Administered 2020-09-19: 100 mg via INTRAVENOUS

## 2020-09-19 MED ORDER — TAPENTADOL HCL 50 MG PO TABS: 75.0000 mg | ORAL_TABLET | Freq: Four times a day (QID) | ORAL | Status: DC | PRN

## 2020-09-19 MED ORDER — VANCOMYCIN HCL 1000 MG IV SOLR
INTRAVENOUS | Status: DC | PRN
  Administered 2020-09-19: 1000 mg via TOPICAL

## 2020-09-19 MED ORDER — ONDANSETRON HCL 4 MG/2ML IJ SOLN
INTRAMUSCULAR | Status: DC | PRN
  Administered 2020-09-19: 4 mg via INTRAVENOUS

## 2020-09-19 MED ORDER — METHOCARBAMOL 500 MG PO TABS: 500.0000 mg | ORAL_TABLET | Freq: Four times a day (QID) | ORAL | 1 refills | Status: DC | PRN

## 2020-09-19 MED ORDER — ONDANSETRON HCL 4 MG/2ML IJ SOLN
INTRAMUSCULAR | Status: AC
  Filled 2020-09-19: qty 2

## 2020-09-19 MED ORDER — PHENOL 1.4 % MT LIQD: 1.0000 | OROMUCOSAL | Status: DC | PRN

## 2020-09-19 MED ORDER — TAPENTADOL HCL 75 MG PO TABS: 75.0000 mg | ORAL_TABLET | Freq: Four times a day (QID) | ORAL | 0 refills | Status: DC | PRN

## 2020-09-19 MED ORDER — PHENYLEPHRINE 40 MCG/ML (10ML) SYRINGE FOR IV PUSH (FOR BLOOD PRESSURE SUPPORT)
PREFILLED_SYRINGE | INTRAVENOUS | Status: AC
  Filled 2020-09-19: qty 10

## 2020-09-19 MED ORDER — THROMBIN 20000 UNITS EX SOLR
CUTANEOUS | Status: AC
  Filled 2020-09-19: qty 20000

## 2020-09-19 MED ORDER — CHLORHEXIDINE GLUCONATE 0.12 % MT SOLN: 15.0000 mL | Freq: Once | OROMUCOSAL | Status: AC

## 2020-09-19 MED ORDER — ROCURONIUM BROMIDE 10 MG/ML (PF) SYRINGE
PREFILLED_SYRINGE | INTRAVENOUS | Status: AC
  Filled 2020-09-19: qty 30

## 2020-09-19 MED ORDER — ONDANSETRON HCL 4 MG PO TABS: 4.0000 mg | ORAL_TABLET | Freq: Four times a day (QID) | ORAL | 0 refills | Status: DC | PRN

## 2020-09-19 MED ORDER — HYDROCHLOROTHIAZIDE 12.5 MG PO CAPS
12.5000 mg | ORAL_CAPSULE | Freq: Every day | ORAL | Status: DC
  Administered 2020-09-19: 12.5 mg via ORAL
  Filled 2020-09-19: qty 1

## 2020-09-19 MED ORDER — CEFAZOLIN SODIUM-DEXTROSE 2-4 GM/100ML-% IV SOLN
2.0000 g | Freq: Three times a day (TID) | INTRAVENOUS | Status: DC
  Administered 2020-09-19: 2 g via INTRAVENOUS
  Filled 2020-09-19: qty 100

## 2020-09-19 MED ORDER — PHENYLEPHRINE HCL (PRESSORS) 10 MG/ML IV SOLN
INTRAVENOUS | Status: DC | PRN
  Administered 2020-09-19: 80 ug via INTRAVENOUS

## 2020-09-19 MED ORDER — ONDANSETRON HCL 4 MG/2ML IJ SOLN: 4.0000 mg | Freq: Once | INTRAMUSCULAR | Status: DC | PRN

## 2020-09-19 MED ORDER — DEXAMETHASONE 4 MG PO TABS
4.0000 mg | ORAL_TABLET | Freq: Four times a day (QID) | ORAL | Status: DC
  Administered 2020-09-19: 4 mg via ORAL
  Filled 2020-09-19: qty 1

## 2020-09-19 MED ORDER — LOSARTAN POTASSIUM 50 MG PO TABS
50.0000 mg | ORAL_TABLET | Freq: Every day | ORAL | Status: DC
  Administered 2020-09-19: 50 mg via ORAL
  Filled 2020-09-19: qty 1

## 2020-09-19 MED ORDER — THROMBIN 5000 UNITS EX SOLR
CUTANEOUS | Status: AC
  Filled 2020-09-19: qty 5000

## 2020-09-19 MED ORDER — LIDOCAINE HCL (PF) 2 % IJ SOLN
INTRAMUSCULAR | Status: AC
  Filled 2020-09-19: qty 10

## 2020-09-19 MED ORDER — DEXAMETHASONE SODIUM PHOSPHATE 10 MG/ML IJ SOLN
INTRAMUSCULAR | Status: DC | PRN
  Administered 2020-09-19: 10 mg via INTRAVENOUS

## 2020-09-19 MED ORDER — METHOCARBAMOL 500 MG PO TABS
500.0000 mg | ORAL_TABLET | Freq: Four times a day (QID) | ORAL | Status: DC | PRN
  Administered 2020-09-19: 500 mg via ORAL

## 2020-09-19 MED ORDER — CEFAZOLIN SODIUM-DEXTROSE 2-4 GM/100ML-% IV SOLN
INTRAVENOUS | Status: AC
  Filled 2020-09-19: qty 100

## 2020-09-19 MED ORDER — HYDROMORPHONE HCL 1 MG/ML IJ SOLN: 0.5000 mg | INTRAMUSCULAR | Status: DC | PRN

## 2020-09-19 MED ORDER — DEXMEDETOMIDINE HCL IN NACL 200 MCG/50ML IV SOLN
0.4000 ug/kg/h | INTRAVENOUS | Status: DC
  Filled 2020-09-19: qty 50

## 2020-09-19 MED ORDER — KETAMINE HCL 50 MG/5ML IJ SOSY
PREFILLED_SYRINGE | INTRAMUSCULAR | Status: AC
  Filled 2020-09-19: qty 5

## 2020-09-19 MED ORDER — CHLORHEXIDINE GLUCONATE 0.12 % MT SOLN
OROMUCOSAL | Status: AC
  Administered 2020-09-19: 15 mL via OROMUCOSAL
  Filled 2020-09-19: qty 15

## 2020-09-19 MED ORDER — 0.9 % SODIUM CHLORIDE (POUR BTL) OPTIME
TOPICAL | Status: DC | PRN
  Administered 2020-09-19 (×2): 1000 mL

## 2020-09-19 MED ORDER — EPHEDRINE SULFATE 50 MG/ML IJ SOLN
INTRAMUSCULAR | Status: DC | PRN
  Administered 2020-09-19: 10 mg via INTRAVENOUS
  Administered 2020-09-19 (×3): 5 mg via INTRAVENOUS

## 2020-09-19 MED ORDER — POTASSIUM CHLORIDE IN NACL 20-0.9 MEQ/L-% IV SOLN: INTRAVENOUS | Status: DC

## 2020-09-19 MED ORDER — THROMBIN 5000 UNITS EX SOLR
OROMUCOSAL | Status: DC | PRN
  Administered 2020-09-19: 5 mL via TOPICAL

## 2020-09-19 MED ORDER — METHOCARBAMOL 500 MG PO TABS
ORAL_TABLET | ORAL | Status: AC
  Filled 2020-09-19: qty 1

## 2020-09-19 MED ORDER — DEXAMETHASONE SODIUM PHOSPHATE 10 MG/ML IJ SOLN: 10.0000 mg | Freq: Once | INTRAMUSCULAR | Status: DC

## 2020-09-19 MED ORDER — SENNA 8.6 MG PO TABS: 1.0000 | ORAL_TABLET | Freq: Two times a day (BID) | ORAL | Status: DC

## 2020-09-19 MED ORDER — CELECOXIB 200 MG PO CAPS: 200.0000 mg | ORAL_CAPSULE | Freq: Two times a day (BID) | ORAL | Status: DC

## 2020-09-19 MED ORDER — ACETAMINOPHEN 325 MG PO TABS: 650.0000 mg | ORAL_TABLET | ORAL | Status: DC | PRN

## 2020-09-19 MED ORDER — OXYCODONE HCL 5 MG PO TABS: 10.0000 mg | ORAL_TABLET | ORAL | Status: DC | PRN

## 2020-09-19 MED ORDER — CEFAZOLIN SODIUM-DEXTROSE 2-4 GM/100ML-% IV SOLN
2.0000 g | INTRAVENOUS | Status: AC
  Administered 2020-09-19: 2 g via INTRAVENOUS

## 2020-09-19 MED ORDER — LEVOTHYROXINE SODIUM 100 MCG PO TABS: 100.0000 ug | ORAL_TABLET | Freq: Every day | ORAL | Status: DC

## 2020-09-19 MED ORDER — ROCURONIUM BROMIDE 10 MG/ML (PF) SYRINGE
PREFILLED_SYRINGE | INTRAVENOUS | Status: DC | PRN
  Administered 2020-09-19: 50 mg via INTRAVENOUS
  Administered 2020-09-19: 40 mg via INTRAVENOUS
  Administered 2020-09-19: 30 mg via INTRAVENOUS
  Administered 2020-09-19: 20 mg via INTRAVENOUS
  Administered 2020-09-19: 10 mg via INTRAVENOUS
  Administered 2020-09-19: 20 mg via INTRAVENOUS
  Administered 2020-09-19: 80 mg via INTRAVENOUS

## 2020-09-19 MED ORDER — ACETAMINOPHEN 10 MG/ML IV SOLN
INTRAVENOUS | Status: DC | PRN
  Administered 2020-09-19: 1000 mg via INTRAVENOUS

## 2020-09-19 MED ORDER — PROPOFOL 10 MG/ML IV BOLUS
INTRAVENOUS | Status: AC
  Filled 2020-09-19: qty 20

## 2020-09-19 MED ORDER — LACTATED RINGERS IV SOLN: INTRAVENOUS | Status: DC

## 2020-09-19 MED ORDER — ACETAMINOPHEN 10 MG/ML IV SOLN: 1000.0000 mg | Freq: Once | INTRAVENOUS | Status: DC | PRN

## 2020-09-19 MED ORDER — METHOCARBAMOL 1000 MG/10ML IJ SOLN
500.0000 mg | Freq: Four times a day (QID) | INTRAVENOUS | Status: DC | PRN
  Filled 2020-09-19: qty 5

## 2020-09-19 MED ORDER — ONDANSETRON HCL 4 MG/2ML IJ SOLN
4.0000 mg | Freq: Four times a day (QID) | INTRAMUSCULAR | Status: DC | PRN
  Administered 2020-09-19: 4 mg via INTRAVENOUS

## 2020-09-19 MED ORDER — SODIUM CHLORIDE 0.9% FLUSH: 3.0000 mL | Freq: Two times a day (BID) | INTRAVENOUS | Status: DC

## 2020-09-19 MED ORDER — DEXAMETHASONE SODIUM PHOSPHATE 4 MG/ML IJ SOLN: 4.0000 mg | Freq: Four times a day (QID) | INTRAMUSCULAR | Status: DC

## 2020-09-19 MED ORDER — HYDROMORPHONE HCL 1 MG/ML IJ SOLN: 0.2500 mg | INTRAMUSCULAR | Status: DC | PRN

## 2020-09-19 MED ORDER — THROMBIN 20000 UNITS EX SOLR
CUTANEOUS | Status: DC | PRN
  Administered 2020-09-19: 20 mL via TOPICAL

## 2020-09-19 MED ORDER — EPHEDRINE 5 MG/ML INJ
INTRAVENOUS | Status: AC
  Filled 2020-09-19: qty 10

## 2020-09-19 MED ORDER — KETOROLAC TROMETHAMINE 30 MG/ML IJ SOLN
INTRAMUSCULAR | Status: DC | PRN
  Administered 2020-09-19: 30 mg via INTRAVENOUS

## 2020-09-19 MED ORDER — SUGAMMADEX SODIUM 200 MG/2ML IV SOLN
INTRAVENOUS | Status: DC | PRN
  Administered 2020-09-19: 200 mg via INTRAVENOUS

## 2020-09-19 MED ORDER — DEXMEDETOMIDINE HCL IN NACL 400 MCG/100ML IV SOLN
0.4000 ug/kg/h | INTRAVENOUS | Status: AC
  Administered 2020-09-19: .3 ug/kg/h via INTRAVENOUS
  Filled 2020-09-19: qty 100

## 2020-09-19 MED ORDER — ACETAMINOPHEN 10 MG/ML IV SOLN
INTRAVENOUS | Status: AC
  Filled 2020-09-19: qty 100

## 2020-09-19 MED ORDER — BUPIVACAINE HCL (PF) 0.25 % IJ SOLN
INTRAMUSCULAR | Status: DC | PRN
  Administered 2020-09-19: 10 mL
  Administered 2020-09-19: 5 mL

## 2020-09-19 MED ORDER — ORAL CARE MOUTH RINSE: 15.0000 mL | Freq: Once | OROMUCOSAL | Status: AC

## 2020-09-19 MED ORDER — DEXMEDETOMIDINE HCL IN NACL 200 MCG/50ML IV SOLN: 0.4000 ug/kg/h | INTRAVENOUS | Status: DC

## 2020-09-19 MED ORDER — PHENYLEPHRINE HCL-NACL 10-0.9 MG/250ML-% IV SOLN
INTRAVENOUS | Status: DC | PRN
  Administered 2020-09-19: 25 ug/min via INTRAVENOUS

## 2020-09-19 MED ORDER — KETAMINE HCL 10 MG/ML IJ SOLN
INTRAMUSCULAR | Status: DC | PRN
  Administered 2020-09-19: 10 mg via INTRAVENOUS
  Administered 2020-09-19 (×2): 20 mg via INTRAVENOUS

## 2020-09-19 MED ORDER — PROPOFOL 10 MG/ML IV BOLUS
INTRAVENOUS | Status: DC | PRN
  Administered 2020-09-19: 150 mg via INTRAVENOUS

## 2020-09-19 MED ORDER — SODIUM CHLORIDE 0.9 % IV SOLN: 250.0000 mL | INTRAVENOUS | Status: DC

## 2020-09-19 MED ORDER — SODIUM CHLORIDE 0.9% FLUSH: 3.0000 mL | INTRAVENOUS | Status: DC | PRN

## 2020-09-19 MED ORDER — FENTANYL CITRATE (PF) 250 MCG/5ML IJ SOLN
INTRAMUSCULAR | Status: DC | PRN
  Administered 2020-09-19 (×5): 50 ug via INTRAVENOUS

## 2020-09-19 MED ORDER — VANCOMYCIN HCL 1000 MG IV SOLR
INTRAVENOUS | Status: AC
  Filled 2020-09-19: qty 1000

## 2020-09-19 MED ORDER — CHLORHEXIDINE GLUCONATE 4 % EX LIQD: 60.0000 mL | Freq: Once | CUTANEOUS | Status: DC

## 2020-09-19 MED ORDER — BUPIVACAINE LIPOSOME 1.3 % IJ SUSP
20.0000 mL | INTRAMUSCULAR | Status: DC
  Filled 2020-09-19: qty 20

## 2020-09-19 MED ORDER — ACETAMINOPHEN 650 MG RE SUPP: 650.0000 mg | RECTAL | Status: DC | PRN

## 2020-09-19 MED ORDER — MIDAZOLAM HCL 2 MG/2ML IJ SOLN
INTRAMUSCULAR | Status: AC
  Filled 2020-09-19: qty 2

## 2020-09-19 MED ORDER — FENTANYL CITRATE (PF) 250 MCG/5ML IJ SOLN
INTRAMUSCULAR | Status: AC
  Filled 2020-09-19: qty 5

## 2020-09-19 MED ORDER — ONDANSETRON HCL 4 MG PO TABS: 4.0000 mg | ORAL_TABLET | Freq: Four times a day (QID) | ORAL | Status: DC | PRN

## 2020-09-19 MED ORDER — MENTHOL 3 MG MT LOZG: 1.0000 | LOZENGE | OROMUCOSAL | Status: DC | PRN

## 2020-09-19 MED ORDER — MIDAZOLAM HCL 2 MG/2ML IJ SOLN
INTRAMUSCULAR | Status: DC | PRN
  Administered 2020-09-19: 2 mg via INTRAVENOUS

## 2020-09-19 SURGICAL SUPPLY — 98 items
ANCHOR LUMBAR 27 (Anchor) ×9 IMPLANT
APPLIER CLIP 11 MED OPEN (CLIP) ×3
BASKET BONE COLLECTION (BASKET) IMPLANT
BENZOIN TINCTURE PRP APPL 2/3 (GAUZE/BANDAGES/DRESSINGS) IMPLANT
BLADE CLIPPER SURG (BLADE) ×3 IMPLANT
BUR BARREL STRAIGHT FLUTE 4.0 (BURR) ×3 IMPLANT
BUR CARBIDE MATCH 3.0 (BURR) ×6 IMPLANT
CANISTER SUCT 3000ML PPV (MISCELLANEOUS) ×3 IMPLANT
CLIP APPLIE 11 MED OPEN (CLIP) ×2 IMPLANT
CLIP LIGATING EXTRA MED SLVR (CLIP) IMPLANT
CLIP LIGATING EXTRA SM BLUE (MISCELLANEOUS) IMPLANT
CNTNR URN SCR LID CUP LEK RST (MISCELLANEOUS) ×2 IMPLANT
CONT SPEC 4OZ STRL OR WHT (MISCELLANEOUS) ×1
COVER BACK TABLE 60X90IN (DRAPES) ×3 IMPLANT
COVER WAND RF STERILE (DRAPES) IMPLANT
DERMABOND ADVANCED (GAUZE/BANDAGES/DRESSINGS) ×2
DERMABOND ADVANCED .7 DNX12 (GAUZE/BANDAGES/DRESSINGS) ×4 IMPLANT
DIFFUSER DRILL AIR PNEUMATIC (MISCELLANEOUS) IMPLANT
DRAPE C-ARM 42X72 X-RAY (DRAPES) ×12 IMPLANT
DRAPE LAPAROTOMY 100X72X124 (DRAPES) ×6 IMPLANT
DRAPE SURG 17X23 STRL (DRAPES) ×3 IMPLANT
DRSG OPSITE POSTOP 4X8 (GAUZE/BANDAGES/DRESSINGS) IMPLANT
DURAPREP 26ML APPLICATOR (WOUND CARE) ×6 IMPLANT
ELECT BLADE 4.0 EZ CLEAN MEGAD (MISCELLANEOUS) ×3
ELECT REM PT RETURN 9FT ADLT (ELECTROSURGICAL)
ELECTRODE BLDE 4.0 EZ CLN MEGD (MISCELLANEOUS) ×2 IMPLANT
ELECTRODE REM PT RTRN 9FT ADLT (ELECTROSURGICAL) IMPLANT
EVACUATOR 1/8 PVC DRAIN (DRAIN) IMPLANT
GAUZE 4X4 16PLY RFD (DISPOSABLE) IMPLANT
GAUZE SPONGE 4X4 12PLY STRL (GAUZE/BANDAGES/DRESSINGS) IMPLANT
GLOVE BIO SURGEON STRL SZ7 (GLOVE) ×12 IMPLANT
GLOVE BIO SURGEON STRL SZ7.5 (GLOVE) ×3 IMPLANT
GLOVE BIO SURGEON STRL SZ8 (GLOVE) ×9 IMPLANT
GLOVE BIOGEL PI IND STRL 6.5 (GLOVE) ×4 IMPLANT
GLOVE BIOGEL PI IND STRL 7.0 (GLOVE) ×4 IMPLANT
GLOVE BIOGEL PI IND STRL 7.5 (GLOVE) ×2 IMPLANT
GLOVE BIOGEL PI INDICATOR 6.5 (GLOVE) ×2
GLOVE BIOGEL PI INDICATOR 7.0 (GLOVE) ×2
GLOVE BIOGEL PI INDICATOR 7.5 (GLOVE) ×1
GLOVE SS BIOGEL STRL SZ 7.5 (GLOVE) ×2 IMPLANT
GLOVE SUPERSENSE BIOGEL SZ 7.5 (GLOVE) ×1
GLOVE SURG SS PI 6.0 STRL IVOR (GLOVE) ×15 IMPLANT
GOWN STRL REUS W/ TWL LRG LVL3 (GOWN DISPOSABLE) ×10 IMPLANT
GOWN STRL REUS W/ TWL XL LVL3 (GOWN DISPOSABLE) ×6 IMPLANT
GOWN STRL REUS W/TWL 2XL LVL3 (GOWN DISPOSABLE) IMPLANT
GOWN STRL REUS W/TWL LRG LVL3 (GOWN DISPOSABLE) ×5
GOWN STRL REUS W/TWL XL LVL3 (GOWN DISPOSABLE) ×3
GRAFT TRINITY ELITE LGE HUMAN (Tissue) ×3 IMPLANT
HEMOSTAT POWDER KIT SURGIFOAM (HEMOSTASIS) ×3 IMPLANT
INSERT FOGARTY 61MM (MISCELLANEOUS) IMPLANT
INSERT FOGARTY SM (MISCELLANEOUS) IMPLANT
KIT BASIN OR (CUSTOM PROCEDURE TRAY) ×6 IMPLANT
KIT TURNOVER KIT B (KITS) ×3 IMPLANT
LOOP VESSEL MAXI BLUE (MISCELLANEOUS) IMPLANT
LOOP VESSEL MINI RED (MISCELLANEOUS) IMPLANT
MARKER SKIN DUAL TIP RULER LAB (MISCELLANEOUS) ×3 IMPLANT
MILL MEDIUM DISP (BLADE) ×3 IMPLANT
NEEDLE HYPO 25X1 1.5 SAFETY (NEEDLE) ×6 IMPLANT
NEEDLE SPNL 18GX3.5 QUINCKE PK (NEEDLE) ×3 IMPLANT
NS IRRIG 1000ML POUR BTL (IV SOLUTION) ×3 IMPLANT
PACK LAMINECTOMY NEURO (CUSTOM PROCEDURE TRAY) ×6 IMPLANT
PAD ARMBOARD 7.5X6 YLW CONV (MISCELLANEOUS) ×18 IMPLANT
ROD LORD LIPPED TI 5.5X35 (Rod) ×6 IMPLANT
SCREW CANC SHANK MOD 6.5X45 (Screw) ×3 IMPLANT
SCREW POLYAXIAL TULIP (Screw) ×12 IMPLANT
SCREW SHANK MOD 5.5X45 (Screw) ×6 IMPLANT
SCREW SHANK MOD 6.5X45 (Screw) ×3 IMPLANT
SET SCREW (Screw) ×4 IMPLANT
SET SCREW SPNE (Screw) ×8 IMPLANT
SPACER HEDRON IA 29X39X15 8D (Spacer) ×3 IMPLANT
SPONGE INTESTINAL PEANUT (DISPOSABLE) ×3 IMPLANT
SPONGE LAP 18X18 RF (DISPOSABLE) ×3 IMPLANT
SPONGE LAP 4X18 RFD (DISPOSABLE) IMPLANT
SPONGE SURGIFOAM ABS GEL 100 (HEMOSTASIS) ×3 IMPLANT
STAPLER VISISTAT 35W (STAPLE) ×3 IMPLANT
STRIP CLOSURE SKIN 1/2X4 (GAUZE/BANDAGES/DRESSINGS) IMPLANT
SUT PDS AB 1 CTX 36 (SUTURE) ×3 IMPLANT
SUT PROLENE 4 0 RB 1 (SUTURE)
SUT PROLENE 4-0 RB1 .5 CRCL 36 (SUTURE) IMPLANT
SUT PROLENE 5 0 CC1 (SUTURE) IMPLANT
SUT PROLENE 6 0 C 1 30 (SUTURE) IMPLANT
SUT PROLENE 6 0 CC (SUTURE) IMPLANT
SUT SILK 0 TIES 10X30 (SUTURE) IMPLANT
SUT SILK 2 0 TIES 10X30 (SUTURE) ×3 IMPLANT
SUT SILK 2 0SH CR/8 30 (SUTURE) IMPLANT
SUT SILK 3 0 TIES 10X30 (SUTURE) IMPLANT
SUT SILK 3 0SH CR/8 30 (SUTURE) IMPLANT
SUT VIC AB 0 CT1 18XCR BRD8 (SUTURE) ×2 IMPLANT
SUT VIC AB 0 CT1 27 (SUTURE)
SUT VIC AB 0 CT1 27XBRD ANBCTR (SUTURE) IMPLANT
SUT VIC AB 0 CT1 8-18 (SUTURE) ×1
SUT VIC AB 2-0 CP2 18 (SUTURE) ×9 IMPLANT
SUT VIC AB 3-0 SH 8-18 (SUTURE) ×9 IMPLANT
SUT VICRYL 4-0 PS2 18IN ABS (SUTURE) IMPLANT
TOWEL GREEN STERILE (TOWEL DISPOSABLE) ×6 IMPLANT
TOWEL GREEN STERILE FF (TOWEL DISPOSABLE) ×3 IMPLANT
TRAY FOLEY MTR SLVR 16FR STAT (SET/KITS/TRAYS/PACK) ×3 IMPLANT
WATER STERILE IRR 1000ML POUR (IV SOLUTION) ×3 IMPLANT

## 2020-09-19 NOTE — Anesthesia Procedure Notes (Signed)
Procedure Name: Intubation Date/Time: 09/19/2020 7:38 AM Performed by: Clearnce Sorrel, CRNA Pre-anesthesia Checklist: Patient identified, Emergency Drugs available, Suction available, Patient being monitored and Timeout performed Patient Re-evaluated:Patient Re-evaluated prior to induction Oxygen Delivery Method: Circle system utilized Preoxygenation: Pre-oxygenation with 100% oxygen Induction Type: IV induction Ventilation: Mask ventilation without difficulty Laryngoscope Size: Mac and 4 Grade View: Grade I Tube type: Oral Tube size: 7.5 mm Number of attempts: 1 Airway Equipment and Method: Stylet Placement Confirmation: ETT inserted through vocal cords under direct vision,  CO2 detector and breath sounds checked- equal and bilateral Secured at: 23 cm Tube secured with: Tape Dental Injury: Teeth and Oropharynx as per pre-operative assessment

## 2020-09-19 NOTE — Anesthesia Preprocedure Evaluation (Signed)
Anesthesia Evaluation  Patient identified by MRN, date of birth, ID band Patient awake    Reviewed: Allergy & Precautions, NPO status , Patient's Chart, lab work & pertinent test results  Airway Mallampati: II  TM Distance: >3 FB Neck ROM: Full    Dental no notable dental hx.    Pulmonary neg pulmonary ROS,    Pulmonary exam normal breath sounds clear to auscultation       Cardiovascular hypertension, Normal cardiovascular exam Rhythm:Regular Rate:Normal     Neuro/Psych negative neurological ROS  negative psych ROS   GI/Hepatic negative GI ROS, Neg liver ROS,   Endo/Other  Hypothyroidism   Renal/GU negative Renal ROS  negative genitourinary   Musculoskeletal negative musculoskeletal ROS (+)   Abdominal   Peds negative pediatric ROS (+)  Hematology negative hematology ROS (+)   Anesthesia Other Findings   Reproductive/Obstetrics negative OB ROS                             Anesthesia Physical Anesthesia Plan  ASA: II  Anesthesia Plan: General   Post-op Pain Management:    Induction: Intravenous  PONV Risk Score and Plan: 2 and Ondansetron, Dexamethasone and Treatment may vary due to age or medical condition  Airway Management Planned: Oral ETT  Additional Equipment:   Intra-op Plan:   Post-operative Plan: Extubation in OR  Informed Consent: I have reviewed the patients History and Physical, chart, labs and discussed the procedure including the risks, benefits and alternatives for the proposed anesthesia with the patient or authorized representative who has indicated his/her understanding and acceptance.     Dental advisory given  Plan Discussed with: CRNA and Surgeon  Anesthesia Plan Comments:         Anesthesia Quick Evaluation

## 2020-09-19 NOTE — Discharge Summary (Signed)
Physician Discharge Summary  Patient ID: NIHAL MARZELLA MRN: [id number] DOB/AGE: 1955-11-01 64 y.o.  Admit date: 09/19/2020 Discharge date: 09/19/2020  Admission Diagnoses: lytic spondy L5-S1    Discharge Diagnoses: same   Discharged Condition: good  Hospital Course: The patient was admitted on 09/19/2020 and taken to the operating room where the patient underwent ALIF/ posterior lumbar decompression and fusion L5-S1. The patient tolerated the procedure well and was taken to the recovery room and then to the floor in stable condition. The hospital course was routine. There were no complications. The wound remained clean dry and intact. Pt had appropriate back soreness. No complaints of leg pain or new N/T/W. The patient remained afebrile with stable vital signs, and tolerated a regular diet. The patient continued to increase activities, and pain was well controlled with oral pain medications.   Consults: None  Significant Diagnostic Studies:  Results for orders placed or performed during the hospital encounter of 09/19/20  ABO/Rh  Result Value Ref Range   ABO/RH(D)      AB POS Performed at [facility], [place] [address] [place], [place] [address]     Chest 2 View  Result Date: 09/15/2020 CLINICAL DATA:  Back surgery, preoperative evaluation EXAM: CHEST - 2 VIEW COMPARISON:  None. FINDINGS: Mild subsegmental atelectasis/scarring at the lung bases. No pleural effusion. Normal heart size. No acute osseous abnormality. IMPRESSION: Mild subsegmental atelectasis/scarring at the lung bases. Electronically Signed   By: Macy Mis M.D.   On: 09/15/2020 08:43   DG Lumbar Spine 2-3 Views  Result Date: 09/19/2020 CLINICAL DATA:  Surgery, elective. Additional history provided: Anterior lumbar interbody fusion lumbar 5-sacral 1, posterolateral fusion with gill decompression lumbar 5-sacral 1. Provided fluoroscopy time 1 minutes, 57 seconds (94.69 mGy). EXAM: LUMBAR SPINE - 2-3 VIEW; DG  C-ARM 1-60 MIN COMPARISON:  CT of the lumbar spine 07/28/2020. FINDINGS: PA and lateral view intraoperative fluoroscopic images of the lumbosacral spine are submitted, 5 images total. The initial images demonstrate anterior lumbar interbody fusion hardware at L5-S1 with overlying retractors. On the later images, a posterior spinal fusion construct is present at L5-S1 (bilateral pedicle screws and vertical interconnecting rods). IMPRESSION: Five intraoperative fluoroscopic images of the lumbosacral spine, as described. Electronically Signed   By: Kellie Simmering DO   On: 09/19/2020 12:51   DG C-Arm 1-60 Min  Result Date: 09/19/2020 CLINICAL DATA:  Surgery, elective. Additional history provided: Anterior lumbar interbody fusion lumbar 5-sacral 1, posterolateral fusion with gill decompression lumbar 5-sacral 1. Provided fluoroscopy time 1 minutes, 57 seconds (94.69 mGy). EXAM: LUMBAR SPINE - 2-3 VIEW; DG C-ARM 1-60 MIN COMPARISON:  CT of the lumbar spine 07/28/2020. FINDINGS: PA and lateral view intraoperative fluoroscopic images of the lumbosacral spine are submitted, 5 images total. The initial images demonstrate anterior lumbar interbody fusion hardware at L5-S1 with overlying retractors. On the later images, a posterior spinal fusion construct is present at L5-S1 (bilateral pedicle screws and vertical interconnecting rods). IMPRESSION: Five intraoperative fluoroscopic images of the lumbosacral spine, as described. Electronically Signed   By: Kellie Simmering DO   On: 09/19/2020 12:51   DG OR LOCAL ABDOMEN  Result Date: 09/19/2020 CLINICAL DATA:  Status post L5-S1 ALIF, routine search for instruments EXAM: OR LOCAL ABDOMEN COMPARISON:  None. FINDINGS: Surgical plate with interlocking screws overlies the L5-S1 disc space. Curvilinear tubing overlies the upper left corner of the image, presumably external to the patient such as suction tube. No additional radiopaque foreign bodies. No dilated small  bowel loops. No  focal osseous lesions. Degenerative changes in the lumbar spine. IMPRESSION: Surgical plate with interlocking screws overlies the L5-S1 disc space. Curvilinear tubing overlies the upper left corner of the image, presumably external to the patient. No additional radiopaque foreign bodies. These results were called by telephone at the time of interpretation on 09/19/2020 at 9:44 am to [facility], RN in the OR, who verbally acknowledged these results. Electronically Signed   By: Ilona Sorrel M.D.   On: 09/19/2020 09:48    Antibiotics:  Anti-infectives (From admission, onward)   Start     Dose/Rate Route Frequency Ordered Stop   09/19/20 1530  ceFAZolin (ANCEF) IVPB 2g/100 mL premix        2 g 200 mL/hr over 30 Minutes Intravenous Every 8 hours 09/19/20 1512 09/20/20 0729   09/19/20 1227  vancomycin (VANCOCIN) powder  Status:  Discontinued          As needed 09/19/20 1227 09/19/20 1246   09/19/20 0600  ceFAZolin (ANCEF) IVPB 2g/100 mL premix        2 g 200 mL/hr over 30 Minutes Intravenous On call to O.R. 09/19/20 0552 09/19/20 0740   09/19/20 0557  ceFAZolin (ANCEF) 2-4 GM/100ML-% IVPB       Note to Pharmacy: Tamsen Snider   : cabinet override      09/19/20 0557 09/19/20 0751      Discharge Exam: Blood pressure 113/88, pulse 74, temperature 97.7 F (36.5 C), temperature source Oral, resp. rate 18, height 5\' 10"  (1.778 m), weight 92.5 kg, SpO2 95 %. Neurologic: Grossly normal dressing dry  Discharge Medications:   Allergies as of 09/19/2020   No Known Allergies     Medication List    TAKE these medications   celecoxib 200 MG capsule Commonly known as: CELEBREX Take 1 capsule (200 mg total) by mouth 2 (two) times daily for 5 days.   hydrochlorothiazide 12.5 MG capsule Commonly known as: MICROZIDE Take 12.5 mg by mouth daily.   levothyroxine 100 MCG tablet Commonly known as: SYNTHROID Take 100 mcg by mouth daily before breakfast.   losartan 50 MG tablet Commonly known as:  COZAAR Take 50 mg by mouth daily.   methocarbamol 500 MG tablet Commonly known as: ROBAXIN Take 1 tablet (500 mg total) by mouth every 6 (six) hours as needed for muscle spasms.   ondansetron 4 MG tablet Commonly known as: ZOFRAN Take 1 tablet (4 mg total) by mouth every 6 (six) hours as needed for nausea or vomiting.   pantoprazole 40 MG tablet Commonly known as: PROTONIX Take 40 mg by mouth daily.   tapentadol HCl 75 MG tablet Commonly known as: NUCYNTA Take 1 tablet (75 mg total) by mouth every 6 (six) hours as needed for moderate pain.       Disposition: home   Final Dx: ALIF/ decompression and fixation L5-S1  Discharge Instructions     Remove dressing in 72 hours   Complete by: As directed    Call MD for:  difficulty breathing, headache or visual disturbances   Complete by: As directed    Call MD for:  persistant nausea and vomiting   Complete by: As directed    Call MD for:  redness, tenderness, or signs of infection (pain, swelling, redness, odor or green/yellow discharge around incision site)   Complete by: As directed    Call MD for:  severe uncontrolled pain   Complete by: As directed    Call MD for:  temperature >  100.4   Complete by: As directed    Diet - low sodium heart healthy   Complete by: As directed    Increase activity slowly   Complete by: As directed        Follow-up Information    Eustace Moore, MD. Schedule an appointment as soon as possible for a visit in 2 week(s).   Specialty: Neurosurgery Contact information: [address] [place] [address] [place] [address] [phone]                Signed: Eustace Moore 09/19/2020, 6:00 PM

## 2020-09-19 NOTE — Op Note (Signed)
09/19/2020  12:40 PM  PATIENT:  Alexander Rojas  64 y.o. male  PRE-OPERATIVE DIAGNOSIS: Lytic spondylolisthesis L5-S1 with spinal stenosis, foraminal stenosis, back and leg pain  POST-OPERATIVE DIAGNOSIS:  same  PROCEDURE:   1. Decompressive lumbar laminectomy hemifacetectomy and foraminotomies (Gill type) L5-S1 2.  Anterior lumbar interbody fusion L5-S1 using a 3D printed titanium interbody cage packed with morcellized allograft  3. Posterior fixation L5-S1 using Alphatec cortical pedicle screws.  4. Intertransverse arthrodesis L5-S1 using morcellized autograft and allograft.  SURGEON:  Sherley Bounds, MD  Co-surgeon: Dr. Sherren Mocha Early  ASSISTANTS: Glenford Peers, FNP  ANESTHESIA:  General  EBL: 300 ml  Total I/O In: 1000 [I.V.:1000] Out: 620 [Urine:320; Blood:300]  BLOOD ADMINISTERED:none  DRAINS: none   INDICATION FOR PROCEDURE: This patient presented with back and bilateral leg pain with numbness and tingling in the feeling of functional weakness at times. Imaging revealed spondylolisthesis L5-S1 with spinal stenosis. The patient tried a reasonable attempt at conservative medical measures without relief. I recommended decompression and instrumented fusion to address the stenosis as well as the segmental  instability.  Patient understood the risks, benefits, and alternatives and potential outcomes and wished to proceed.  PROCEDURE DETAILS:  The patient was brought to the operating room. After induction of generalized endotracheal anesthesia the patient was placed in the supine position on the operating room table.  The exposure was performed Dr. Sherren Mocha Early of vascular surgery with my assistance and this will be described in a separate operative report by Dr. Donnetta Hutching.  Once the exposure was completed I placed a needle into the disc base and marked the midline with AP fluoroscopy.  I then incised the disc base and perform the initial discectomy with pituitary rongeurs.  There were large  anterior osteophytes coming off of L5 that were removed with high-speed drill and the Kerrison punch.  I then released more disc from the endplate with a Cobb elevator and perform more discectomy with pituitary rongeur.  I then used a curved curette to scrape the endplates further.  I then drilled the endplates with a high-speed drill to prepare for arthrodesis.  This was done with a 15 mm space distractor in place.  Once the disc base was prepared we used sequential trials guarding with a 13 mm 15 degree trial.  Tried several different trials but felt that the 15 mm 8 degree trial fit the best.  17 mm seem to large and the 15 degree trials did not distract the back of the disc spase as much as we wanted.  Therefore we packed a 15 mm 8 degree cage with morselized allograft, loaded it on the inserter and then placed the graft into the disc base utilizing lateral fluoroscopy.  We then anchored the graft with 1 27 mm anchor into L5 and 2 27 mm anchors into S1.  We removed the distractor and locked the anchors into place with the locking mechanism on the graft.  We then checked our construct with AP and lateral fluoroscopy.  We removed the retractor and found no significant bleeding.  We got a final AP x-ray to make sure there were no retained instruments or sponges.  The sponge count and instrument count was also correct.  We closed the rectus sheath with a running 2-0 Prolene.  Closed the subcutaneous tissue with 2-0 Vicryl in the subcuticular tissue with 3-0 Vicryl.  We closed the skin with Dermabond.  We then placed the patient back on the stretcher to get ready for  the posterior operation.    He was then rolled into the prone position on chest rolls and all pressure points were padded. The patient's lumbar region was cleaned with Betadine scrub and then prepped with DuraPrep and draped in the usual sterile fashion.  7 cc of local anesthesia was injected and then a dorsal midline incision was made and carried  down to the lumbosacral fascia. The fascia was opened and the paraspinous musculature was taken down in a subperiosteal fashion to expose L5-S1 bilaterally. A self-retaining retractor was placed. Intraoperative fluoroscopy confirmed my level. I then turned my attention to the decompression and complete lumbar laminectomies, hemi- facetectomies, and foraminotomies were performed at L5-S1 in a Gill type manner.  My nurse practitioner was directly involved in the decompression and exposure of the neural elements. the patient had significant spinal stenosis and foraminal stenosis. A much more generous decompression and generous foraminotomies were undertaken in order to adequately decompress the neural elements and address the patient's leg pain. The yellow ligament was removed to expose the underlying dura and nerve roots, and generous foraminotomies were performed to adequately decompress the neural elements. Both the exiting and traversing nerve roots were decompressed on both sides until a coronary dilator passed easily along the nerve roots. Once the decompression was complete, I turned my attention to the placement of the pedicle screws.  The pedicle screw entry zones were identified utilizing surface landmarks and fluoroscopy. I drilled into each pedicle utilizing the hand drill, and tapped each pedicle with the appropriate tap. We palpated with a ball probe to assure no break in the cortex. We then placed 5.5 x 45 mm pedicle screws into the pedicles bilaterally at L5 and 6.5 x 45 mm pedicle screws at S1.  My nurse practitioner assisted in placement of the pedicle screws.  We then decorticated the transverse processes and laid a mixture of morcellized autograft and allograft out over these to perform intertransverse arthrodesis at L5 S1. We then placed lordotic rods into the multiaxial screw heads of the pedicle screws and locked these in position with the locking caps and anti-torque device. We then checked our  construct with AP and lateral fluoroscopy. Irrigated with copious amounts of bacitracin-containing saline solution. Inspected the nerve roots once again to assure adequate decompression, lined to the dura with Gelfoam, placed powdered vancomycin into the wound, and then we closed the muscle and the fascia with 0 Vicryl. Closed the subcutaneous tissues with 2-0 Vicryl and subcuticular tissues with 3-0 Vicryl. The skin was closed with benzoin and Steri-Strips. Dressing was then applied, the patient was awakened from general anesthesia and transported to the recovery room in stable condition. At the end of the procedure all sponge, needle and instrument counts were correct.   PLAN OF CARE: admit to inpatient  PATIENT DISPOSITION:  PACU - hemodynamically stable.   Delay start of Pharmacological VTE agent (>24hrs) due to surgical blood loss or risk of bleeding:  yes

## 2020-09-19 NOTE — Progress Notes (Signed)
Patient requested that sacral dressing be left off

## 2020-09-19 NOTE — Anesthesia Postprocedure Evaluation (Signed)
Anesthesia Post Note  Patient: Alexander Rojas  Procedure(s) Performed: Anterior Lumbar Interbody Fusion Lumbar Five-Sacral One, posterior lateral fusion with gill decompression Lumbar Five-Sacral One. (N/A Spine Lumbar) LAMINECTOMY WITH POSTERIOR LATERAL ARTHRODESIS LEVEL 1 (N/A Spine Lumbar) ABDOMINAL EXPOSURE (N/A Abdomen)     Patient location during evaluation: PACU Anesthesia Type: General Level of consciousness: awake and alert Pain management: pain level controlled Vital Signs Assessment: post-procedure vital signs reviewed and stable Respiratory status: spontaneous breathing, nonlabored ventilation, respiratory function stable and patient connected to nasal cannula oxygen Cardiovascular status: blood pressure returned to baseline and stable Postop Assessment: no apparent nausea or vomiting Anesthetic complications: no   No complications documented.  Last Vitals:  Vitals:   09/19/20 1345 09/19/20 1400  BP: 93/71 94/71  Pulse: 73 74  Resp: 18 18  Temp:    SpO2: 92% 94%    Last Pain:  Vitals:   09/19/20 1330  TempSrc:   PainSc: 0-No pain                 Laurisa Sahakian S

## 2020-09-19 NOTE — Op Note (Signed)
    OPERATIVE REPORT  DATE OF SURGERY: 09/19/2020  PATIENT: Alexander Rojas, 64 y.o. male MRN: [id number]  DOB: 1956/10/19  PRE-OPERATIVE DIAGNOSIS: Degenerative disc disease  POST-OPERATIVE DIAGNOSIS:  Same  PROCEDURE: Anterior exposure for L5-S1 disc fusion with Dr. Ronnald Ramp  SURGEON:  Curt Jews, M.D.  Co-surgeon for the exposure Dr. Sherley Bounds  The assistant was needed for exposure and to expedite the case  ANESTHESIA: General  EBL: per anesthesia record  Total I/O In: 1000 [I.V.:1000] Out: 260 [Urine:160; Blood:100]  BLOOD ADMINISTERED: none  DRAINS: none  SPECIMEN: none  COUNTS CORRECT:  YES  PATIENT DISPOSITION:  PACU - hemodynamically stable  PROCEDURE DETAILS: The patient was taken to room placed supine position where the area of the abdomen was prepped draped you sterile fashion.  Crosstable lateral C arm was used identify the level of the L5-S1 disc.  Incision was made from the level of the midline to the left over the rectus muscle.  The subcutaneous fat was divided in line with the skin incision.  The anterior sheath was opened with electrocautery and the rectus muscle was mobilized circumferentially.  The rectus muscle was mobilized to the midline.  The retroperitoneal space was entered bluntly and the posterior sheath was opened laterally.  Blunt dissection was used to mobilize the left ureter to the right.  Dissection was continued down to the L5-S1 disc.  The iliac vessels were mobilized to the right and left of the L5-S1 disc.  Middle sacral vessels were clipped and divided.  Blunt dissection was continued to give adequate exposure on either side of the L5-S1 disc.  The Thompson retractor was brought onto the field and the reverse lip 150 blades were positioned to the right and left of the L5-S1 disc.  Malleable retractors were used for superior and inferior exposure.  Spinal needle was placed in the L5-S1 disc and C-arm was brought back onto the field to confirm  that this was the appropriate level.  The remainder of the procedure will be dictated as a separate note by Dr. Richardson Landry, M.D., [facility] 09/19/2020 11:27 AM

## 2020-09-19 NOTE — Plan of Care (Signed)
Pt doing well. Pt and wife given D/C instructions with verbal understanding. Rx's were sent to the pharmacy by MD. Pt's incisions are clean and dry with no sign of infection. Pt's IV's were removed prior to D/C. Pt D/C'd home via wheelchair per MD order. Pt is stable @ D/C and has no other needs at this time. Holli Humbles, RN

## 2020-09-19 NOTE — Transfer of Care (Signed)
Immediate Anesthesia Transfer of Care Note  Patient: Alexander Rojas  Procedure(s) Performed: Anterior Lumbar Interbody Fusion Lumbar Five-Sacral One, posterior lateral fusion with gill decompression Lumbar Five-Sacral One. (N/A Spine Lumbar) LAMINECTOMY WITH POSTERIOR LATERAL ARTHRODESIS LEVEL 1 (N/A Spine Lumbar) ABDOMINAL EXPOSURE (N/A Abdomen)  Patient Location: PACU  Anesthesia Type:General  Level of Consciousness: awake, alert  and oriented  Airway & Oxygen Therapy: Patient Spontanous Breathing  Post-op Assessment: Report given to RN and Post -op Vital signs reviewed and stable  Post vital signs: Reviewed and stable  Last Vitals:  Vitals Value Taken Time  BP    Temp    Pulse    Resp    SpO2      Last Pain:  Vitals:   09/19/20 0617  TempSrc: Oral  PainSc:          Complications: No complications documented.

## 2020-09-19 NOTE — H&P (Signed)
Subjective: Patient is a 64 y.o. male admitted for lytic spondylolisthesis L5-S1. Onset of symptoms was several months ago, gradually worsening since that time.  The pain is rated severe, and is located at the across the lower back and radiates to legs. The pain is described as aching and occurs all day. The symptoms have been progressive. Symptoms are exacerbated by exercise and standing. MRI or CT showed lytic spondy L5-S1   Past Medical History:  Diagnosis Date  . Back pain   . BPH (benign prostatic hyperplasia)   . Extensor tendon laceration, finger, open wound, initial encounter    left thumb  . Hypertension   . Hypothyroidism   . Macrocytosis   . Numbness and tingling   . Pars defect with spondylolisthesis     Past Surgical History:  Procedure Laterality Date  . COLONOSCOPY WITH PROPOFOL N/A 12/18/2018   Procedure: COLONOSCOPY WITH BIOPSY;  Surgeon: Lucilla Lame, MD;  Location: [place];  Service: Endoscopy;  Laterality: N/A;  NEEDS TO STAY FIRST  . ESOPHAGOGASTRODUODENOSCOPY    . ESOPHAGOGASTRODUODENOSCOPY (EGD) WITH PROPOFOL N/A 02/23/2019   Procedure: ESOPHAGOGASTRODUODENOSCOPY (EGD) WITH PROPOFOL;  Surgeon: Lucilla Lame, MD;  Location: [facility] ENDOSCOPY;  Service: Endoscopy;  Laterality: N/A;  . HERNIA REPAIR    . LIPOMA EXCISION Right    upper arm  . POLYPECTOMY N/A 12/18/2018   Procedure: POLYPECTOMY;  Surgeon: Lucilla Lame, MD;  Location: [place];  Service: Endoscopy;  Laterality: N/A;  . TENDON REPAIR Left 11/05/2019   Procedure: LEFT THUMB TENDON REPAIR;  Surgeon: Cindra Presume, MD;  Location: [place];  Service: Plastics;  Laterality: Left;  Axillary block, MAC  . UMBILICAL HERNIA REPAIR      Prior to Admission medications   Medication Sig Start Date End Date Taking? Authorizing Provider  hydrochlorothiazide (MICROZIDE) 12.5 MG capsule Take 12.5 mg by mouth daily. 05/25/20  Yes [provider]  levothyroxine (SYNTHROID) 100  MCG tablet Take 100 mcg by mouth daily before breakfast.  08/27/19  Yes [provider]  losartan (COZAAR) 50 MG tablet Take 50 mg by mouth daily.  03/01/20  Yes [provider]  pantoprazole (PROTONIX) 40 MG tablet Take 40 mg by mouth daily.  03/01/20  Yes [provider]   No Known Allergies  Social History   Tobacco Use  . Smoking status: Never Smoker  . Smokeless tobacco: Never Used  Substance Use Topics  . Alcohol use: Not Currently    History reviewed. No pertinent family history.   Review of Systems  Positive ROS: neg  All other systems have been reviewed and were otherwise negative with the exception of those mentioned in the HPI and as above.  Objective: Vital signs in last 24 hours: Temp:  [97.9 F (36.6 C)] 97.9 F (36.6 C) (11/30 0617) Pulse Rate:  [58] 58 (11/30 0617) Resp:  [18] 18 (11/30 0617) BP: (131)/(78) 131/78 (11/30 0617) SpO2:  [95 %] 95 % (11/30 0617) Weight:  [92.5 kg] 92.5 kg (11/30 0605)  General Appearance: Alert, cooperative, no distress, appears stated age Head: Normocephalic, without obvious abnormality, atraumatic Eyes: PERRL, conjunctiva/corneas clear, EOM's intact    Neck: Supple, symmetrical, trachea midline Back: Symmetric, no curvature, ROM normal, no CVA tenderness Lungs:  respirations unlabored Heart: Regular rate and rhythm Abdomen: Soft, non-tender Extremities: Extremities normal, atraumatic, no cyanosis or edema Pulses: 2+ and symmetric all extremities Skin: Skin color, texture, turgor normal, no rashes or lesions  NEUROLOGIC:   Mental status:  Alert and oriented x4,  no aphasia, good attention span, fund of knowledge, and memory Motor Exam - grossly normal Sensory Exam - grossly normal Reflexes: 1+ Coordination - grossly normal Gait - grossly normal Balance - grossly normal Cranial Nerves: I: smell Not tested  II: visual acuity  OS: nl    OD: nl  II: visual fields Full to confrontation  II:  pupils Equal, round, reactive to light  III,VII: ptosis None  III,IV,VI: extraocular muscles  Full ROM  V: mastication Normal  V: facial light touch sensation  Normal  V,VII: corneal reflex  Present  VII: facial muscle function - upper  Normal  VII: facial muscle function - lower Normal  VIII: hearing Not tested  IX: soft palate elevation  Normal  IX,X: gag reflex Present  XI: trapezius strength  5/5  XI: sternocleidomastoid strength 5/5  XI: neck flexion strength  5/5  XII: tongue strength  Normal    Data Review Lab Results  Component Value Date   WBC 5.6 09/15/2020   HGB 15.1 09/15/2020   HCT 44.7 09/15/2020   MCV 102.1 (H) 09/15/2020   PLT 220 09/15/2020   Lab Results  Component Value Date   NA 140 09/15/2020   K 3.6 09/15/2020   CL 108 09/15/2020   CO2 24 09/15/2020   BUN 18 09/15/2020   CREATININE 0.88 09/15/2020   GLUCOSE 110 (H) 09/15/2020   Lab Results  Component Value Date   INR 1.0 09/15/2020    Assessment/Plan:  Estimated body mass index is 29.27 kg/m as calculated from the following:   Height as of this encounter: _0  (1.778 m).   Weight as of this encounter: 92.5 kg. Patient admitted for ALIF/ PLF L5-S1. Patient has failed a reasonable attempt at conservative therapy.  I explained the condition and procedure to the patient and answered any questions.  Patient wishes to proceed with procedure as planned. Understands risks/ benefits and typical outcomes of procedure.   Eustace Moore 09/19/2020 7:10 AM

## 2020-09-20 ENCOUNTER — Encounter (HOSPITAL_COMMUNITY): Payer: Self-pay | Admitting: Neurological Surgery

## 2020-09-22 ENCOUNTER — Other Ambulatory Visit: Payer: Self-pay | Admitting: *Deleted

## 2020-09-22 MED FILL — Heparin Sodium (Porcine) Inj 1000 Unit/ML: INTRAMUSCULAR | Qty: 30 | Status: AC

## 2020-09-22 MED FILL — Sodium Chloride IV Soln 0.9%: INTRAVENOUS | Qty: 1000 | Status: AC

## 2020-09-22 NOTE — Patient Outreach (Signed)
[facility]) Care Management  09/22/2020  Alexander Rojas 1955/11/21 [id number]   Transition of care telephone call  Referral received:09/19/20 Initial outreach:09/22/20 Insurance: [facility]   Initial unsuccessful telephone call to patient's preferred number in order to complete transition of care assessment; no answer, left HIPAA compliant voicemail message requesting return call.   Objective: Mr. Alexander Rojas was hospitalized at [facility] 09/19/20 for Anterior Lumbar Interbody fusion Lumbar 5 to Sacral one .  He  was discharged to home on 11/30 without the need for home health services or DME.   Plan: This RNCM will route unsuccessful outreach letter with [facility] Management pamphlet and 24 hour Nurse Advice Line Magnet to [facility] [employer] clinical pool to be mailed to patient's home address. This RNCM will attempt another outreach within 4 business days.  Joylene Draft, RN, BSN  [place] Management Coordinator  [phone]- Mobile [phone]- Toll Free Main Office

## 2020-09-25 ENCOUNTER — Ambulatory Visit
Admission: RE | Admit: 2020-09-25 | Discharge: 2020-09-25 | Disposition: A | Discharge status: Home / Self Care | Payer: 59 | Source: Ambulatory Visit | Attending: Neurological Surgery | Admitting: Neurological Surgery

## 2020-09-25 ENCOUNTER — Other Ambulatory Visit: Payer: Self-pay | Admitting: Neurological Surgery

## 2020-09-25 DIAGNOSIS — M479 Spondylosis, unspecified: Secondary | ICD-10-CM

## 2020-09-25 DIAGNOSIS — M5126 Other intervertebral disc displacement, lumbar region: Secondary | ICD-10-CM | POA: Diagnosis not present

## 2020-09-25 DIAGNOSIS — M4310 Spondylolisthesis, site unspecified: Secondary | ICD-10-CM

## 2020-09-25 DIAGNOSIS — M4300 Spondylolysis, site unspecified: Secondary | ICD-10-CM | POA: Diagnosis not present

## 2020-09-26 ENCOUNTER — Encounter (HOSPITAL_COMMUNITY): Payer: Self-pay | Admitting: Neurological Surgery

## 2020-09-26 ENCOUNTER — Inpatient Hospital Stay (HOSPITAL_COMMUNITY)
Admission: AD | Admit: 2020-09-26 | Discharge: 2020-10-10 | DRG: 460 | Disposition: A | Discharge status: Rehabilitation Facility | Payer: 59 | Attending: Neurological Surgery | Admitting: Neurological Surgery

## 2020-09-26 ENCOUNTER — Inpatient Hospital Stay (HOSPITAL_COMMUNITY): Payer: 59

## 2020-09-26 DIAGNOSIS — M5416 Radiculopathy, lumbar region: Secondary | ICD-10-CM | POA: Diagnosis not present

## 2020-09-26 DIAGNOSIS — T84038A Mechanical loosening of other internal prosthetic joint, initial encounter: Principal | ICD-10-CM | POA: Diagnosis present

## 2020-09-26 DIAGNOSIS — Z20822 Contact with and (suspected) exposure to covid-19: Secondary | ICD-10-CM | POA: Diagnosis present

## 2020-09-26 DIAGNOSIS — Y831 Surgical operation with implant of artificial internal device as the cause of abnormal reaction of the patient, or of later complication, without mention of misadventure at the time of the procedure: Secondary | ICD-10-CM | POA: Diagnosis present

## 2020-09-26 DIAGNOSIS — T402X5A Adverse effect of other opioids, initial encounter: Secondary | ICD-10-CM | POA: Diagnosis not present

## 2020-09-26 DIAGNOSIS — N390 Urinary tract infection, site not specified: Secondary | ICD-10-CM | POA: Diagnosis not present

## 2020-09-26 DIAGNOSIS — S3219XA Other fracture of sacrum, initial encounter for closed fracture: Secondary | ICD-10-CM | POA: Diagnosis present

## 2020-09-26 DIAGNOSIS — Z79899 Other long term (current) drug therapy: Secondary | ICD-10-CM

## 2020-09-26 DIAGNOSIS — M4326 Fusion of spine, lumbar region: Secondary | ICD-10-CM | POA: Diagnosis not present

## 2020-09-26 DIAGNOSIS — S3210XA Unspecified fracture of sacrum, initial encounter for closed fracture: Secondary | ICD-10-CM | POA: Diagnosis not present

## 2020-09-26 DIAGNOSIS — R531 Weakness: Secondary | ICD-10-CM | POA: Diagnosis not present

## 2020-09-26 DIAGNOSIS — M960 Pseudarthrosis after fusion or arthrodesis: Secondary | ICD-10-CM | POA: Diagnosis not present

## 2020-09-26 DIAGNOSIS — T84296A Other mechanical complication of internal fixation device of vertebrae, initial encounter: Secondary | ICD-10-CM | POA: Diagnosis not present

## 2020-09-26 DIAGNOSIS — Z419 Encounter for procedure for purposes other than remedying health state, unspecified: Secondary | ICD-10-CM

## 2020-09-26 DIAGNOSIS — R609 Edema, unspecified: Secondary | ICD-10-CM | POA: Diagnosis not present

## 2020-09-26 DIAGNOSIS — Z79890 Hormone replacement therapy (postmenopausal): Secondary | ICD-10-CM

## 2020-09-26 DIAGNOSIS — Z8679 Personal history of other diseases of the circulatory system: Secondary | ICD-10-CM | POA: Diagnosis not present

## 2020-09-26 DIAGNOSIS — Z981 Arthrodesis status: Secondary | ICD-10-CM | POA: Diagnosis not present

## 2020-09-26 DIAGNOSIS — E039 Hypothyroidism, unspecified: Secondary | ICD-10-CM | POA: Diagnosis not present

## 2020-09-26 DIAGNOSIS — G8929 Other chronic pain: Secondary | ICD-10-CM | POA: Diagnosis not present

## 2020-09-26 DIAGNOSIS — I10 Essential (primary) hypertension: Secondary | ICD-10-CM | POA: Diagnosis present

## 2020-09-26 DIAGNOSIS — K5903 Drug induced constipation: Secondary | ICD-10-CM | POA: Diagnosis not present

## 2020-09-26 DIAGNOSIS — G8918 Other acute postprocedural pain: Secondary | ICD-10-CM

## 2020-09-26 DIAGNOSIS — D7589 Other specified diseases of blood and blood-forming organs: Secondary | ICD-10-CM | POA: Diagnosis not present

## 2020-09-26 DIAGNOSIS — R300 Dysuria: Secondary | ICD-10-CM | POA: Diagnosis not present

## 2020-09-26 DIAGNOSIS — N401 Enlarged prostate with lower urinary tract symptoms: Secondary | ICD-10-CM | POA: Diagnosis not present

## 2020-09-26 DIAGNOSIS — T84216A Breakdown (mechanical) of internal fixation device of vertebrae, initial encounter: Secondary | ICD-10-CM | POA: Diagnosis present

## 2020-09-26 DIAGNOSIS — D72829 Elevated white blood cell count, unspecified: Secondary | ICD-10-CM | POA: Diagnosis not present

## 2020-09-26 DIAGNOSIS — R5381 Other malaise: Secondary | ICD-10-CM | POA: Diagnosis not present

## 2020-09-26 DIAGNOSIS — M4316 Spondylolisthesis, lumbar region: Secondary | ICD-10-CM | POA: Diagnosis not present

## 2020-09-26 DIAGNOSIS — R3911 Hesitancy of micturition: Secondary | ICD-10-CM | POA: Diagnosis not present

## 2020-09-26 DIAGNOSIS — M792 Neuralgia and neuritis, unspecified: Secondary | ICD-10-CM | POA: Diagnosis not present

## 2020-09-26 LAB — RESP PANEL BY RT-PCR (FLU A&B, COVID) ARPGX2
Influenza A by PCR: NEGATIVE
Influenza B by PCR: NEGATIVE
SARS Coronavirus 2 by RT PCR: NEGATIVE

## 2020-09-26 LAB — MRSA PCR SCREENING: MRSA by PCR: NEGATIVE

## 2020-09-26 MED ORDER — BISACODYL 5 MG PO TBEC
5.0000 mg | DELAYED_RELEASE_TABLET | Freq: Every day | ORAL | Status: DC | PRN
  Administered 2020-10-04: 5 mg via ORAL
  Filled 2020-09-26: qty 1

## 2020-09-26 MED ORDER — POLYETHYLENE GLYCOL 3350 17 G PO PACK
17.0000 g | PACK | Freq: Every day | ORAL | Status: DC | PRN
  Administered 2020-10-04 – 2020-10-08 (×2): 17 g via ORAL
  Filled 2020-09-26 (×2): qty 1

## 2020-09-26 MED ORDER — DEXAMETHASONE SODIUM PHOSPHATE 4 MG/ML IJ SOLN
4.0000 mg | Freq: Four times a day (QID) | INTRAMUSCULAR | Status: DC
  Administered 2020-09-26 – 2020-09-27 (×4): 4 mg via INTRAVENOUS
  Filled 2020-09-26 (×4): qty 1

## 2020-09-26 MED ORDER — PANTOPRAZOLE SODIUM 40 MG PO TBEC
40.0000 mg | DELAYED_RELEASE_TABLET | Freq: Every day | ORAL | Status: DC
  Administered 2020-09-27: 40 mg via ORAL
  Filled 2020-09-26: qty 1

## 2020-09-26 MED ORDER — TAPENTADOL HCL 50 MG PO TABS
100.0000 mg | ORAL_TABLET | ORAL | Status: DC | PRN
  Administered 2020-09-26: 100 mg via ORAL
  Filled 2020-09-26: qty 2

## 2020-09-26 MED ORDER — ACETAMINOPHEN 325 MG PO TABS: 650.0000 mg | ORAL_TABLET | Freq: Four times a day (QID) | ORAL | Status: DC | PRN

## 2020-09-26 MED ORDER — SODIUM CHLORIDE 0.9 % IV SOLN: INTRAVENOUS | Status: DC

## 2020-09-26 MED ORDER — ONDANSETRON HCL 4 MG/2ML IJ SOLN
4.0000 mg | Freq: Four times a day (QID) | INTRAMUSCULAR | Status: DC | PRN
  Administered 2020-09-26: 4 mg via INTRAVENOUS
  Filled 2020-09-26: qty 2

## 2020-09-26 MED ORDER — LOSARTAN POTASSIUM 50 MG PO TABS
50.0000 mg | ORAL_TABLET | Freq: Every day | ORAL | Status: DC
  Administered 2020-10-02 – 2020-10-08 (×4): 50 mg via ORAL
  Filled 2020-09-26 (×9): qty 1

## 2020-09-26 MED ORDER — LEVOTHYROXINE SODIUM 100 MCG PO TABS
100.0000 ug | ORAL_TABLET | Freq: Every day | ORAL | Status: DC
  Administered 2020-09-28 – 2020-10-10 (×13): 100 ug via ORAL
  Filled 2020-09-26 (×14): qty 1

## 2020-09-26 MED ORDER — ACETAMINOPHEN 650 MG RE SUPP: 650.0000 mg | Freq: Four times a day (QID) | RECTAL | Status: DC | PRN

## 2020-09-26 MED ORDER — HYDROCHLOROTHIAZIDE 12.5 MG PO CAPS
12.5000 mg | ORAL_CAPSULE | Freq: Every day | ORAL | Status: DC
  Administered 2020-10-02 – 2020-10-08 (×4): 12.5 mg via ORAL
  Filled 2020-09-26 (×9): qty 1

## 2020-09-26 MED ORDER — METHOCARBAMOL 500 MG PO TABS
500.0000 mg | ORAL_TABLET | Freq: Four times a day (QID) | ORAL | Status: DC | PRN
  Administered 2020-09-26: 500 mg via ORAL
  Filled 2020-09-26: qty 1

## 2020-09-26 MED ORDER — GABAPENTIN 300 MG PO CAPS
300.0000 mg | ORAL_CAPSULE | Freq: Every day | ORAL | Status: DC
  Administered 2020-09-26 – 2020-09-28 (×2): 300 mg via ORAL
  Filled 2020-09-26 (×2): qty 1

## 2020-09-26 MED ORDER — HYDROMORPHONE HCL 1 MG/ML IJ SOLN
0.5000 mg | INTRAMUSCULAR | Status: DC | PRN
  Administered 2020-09-26 – 2020-09-27 (×6): 1 mg via INTRAVENOUS
  Filled 2020-09-26 (×6): qty 1

## 2020-09-26 NOTE — Progress Notes (Signed)
Pt arrived to unit via PTAR as direct admit. No orders. NP notified and putting in orders. Pt VS stable. Bed in lowest position and call bell in reach.

## 2020-09-27 ENCOUNTER — Other Ambulatory Visit: Payer: Self-pay | Admitting: *Deleted

## 2020-09-27 ENCOUNTER — Inpatient Hospital Stay (HOSPITAL_COMMUNITY): Admission: RE | Admit: 2020-09-27 | Payer: 59 | Source: Home / Self Care | Admitting: Neurological Surgery

## 2020-09-27 ENCOUNTER — Inpatient Hospital Stay (HOSPITAL_COMMUNITY): Payer: 59 | Admitting: Certified Registered Nurse Anesthetist

## 2020-09-27 ENCOUNTER — Inpatient Hospital Stay (HOSPITAL_COMMUNITY): Payer: 59

## 2020-09-27 ENCOUNTER — Encounter (HOSPITAL_COMMUNITY): Payer: Self-pay | Admitting: Neurological Surgery

## 2020-09-27 ENCOUNTER — Encounter (HOSPITAL_COMMUNITY)
Admission: AD | Disposition: A | Discharge status: Rehabilitation Facility | Payer: Self-pay | Source: Home / Self Care | Attending: Neurological Surgery

## 2020-09-27 ENCOUNTER — Ambulatory Visit: Payer: Self-pay | Admitting: *Deleted

## 2020-09-27 HISTORY — PX: APPLICATION OF INTRAOPERATIVE CT SCAN: SHX6668

## 2020-09-27 HISTORY — PX: LAMINECTOMY WITH POSTERIOR LATERAL ARTHRODESIS LEVEL 2: SHX6336

## 2020-09-27 LAB — TYPE AND SCREEN
ABO/RH(D): AB POS
ABO/RH(D): AB POS
Antibody Screen: NEGATIVE
Antibody Screen: NEGATIVE

## 2020-09-27 LAB — SURGICAL PCR SCREEN
MRSA, PCR: NEGATIVE
Staphylococcus aureus: NEGATIVE

## 2020-09-27 SURGERY — LAMINECTOMY WITH POSTERIOR LATERAL ARTHRODESIS LEVEL 2
Anesthesia: General | Site: Spine Lumbar

## 2020-09-27 MED ORDER — DIAZEPAM 5 MG/ML IJ SOLN
2.5000 mg | Freq: Once | INTRAMUSCULAR | Status: AC
  Administered 2020-09-27 (×2): 2.5 mg via INTRAVENOUS

## 2020-09-27 MED ORDER — VANCOMYCIN HCL 1000 MG IV SOLR
INTRAVENOUS | Status: AC
  Filled 2020-09-27: qty 1000

## 2020-09-27 MED ORDER — MUPIROCIN 2 % EX OINT: 1.0000 "application " | TOPICAL_OINTMENT | Freq: Two times a day (BID) | CUTANEOUS | Status: DC

## 2020-09-27 MED ORDER — CEFAZOLIN SODIUM-DEXTROSE 2-3 GM-%(50ML) IV SOLR
INTRAVENOUS | Status: DC | PRN
  Administered 2020-09-27 (×2): 2 g via INTRAVENOUS

## 2020-09-27 MED ORDER — DEXMEDETOMIDINE HCL IN NACL 400 MCG/100ML IV SOLN
INTRAVENOUS | Status: AC
  Filled 2020-09-27: qty 100

## 2020-09-27 MED ORDER — THROMBIN 5000 UNITS EX SOLR
CUTANEOUS | Status: AC
  Filled 2020-09-27: qty 5000

## 2020-09-27 MED ORDER — MIDAZOLAM HCL 2 MG/2ML IJ SOLN
INTRAMUSCULAR | Status: AC
  Filled 2020-09-27: qty 2

## 2020-09-27 MED ORDER — PHENYLEPHRINE HCL-NACL 10-0.9 MG/250ML-% IV SOLN
INTRAVENOUS | Status: DC | PRN
  Administered 2020-09-27: 40 ug/min via INTRAVENOUS

## 2020-09-27 MED ORDER — DIAZEPAM 5 MG/ML IJ SOLN: 2.5000 mg | Freq: Once | INTRAMUSCULAR | Status: DC

## 2020-09-27 MED ORDER — BUPIVACAINE HCL (PF) 0.25 % IJ SOLN
INTRAMUSCULAR | Status: DC | PRN
  Administered 2020-09-27: 2 mL

## 2020-09-27 MED ORDER — THROMBIN 20000 UNITS EX SOLR: CUTANEOUS | Status: DC | PRN

## 2020-09-27 MED ORDER — SUGAMMADEX SODIUM 200 MG/2ML IV SOLN
INTRAVENOUS | Status: DC | PRN
  Administered 2020-09-27: 200 mg via INTRAVENOUS

## 2020-09-27 MED ORDER — 0.9 % SODIUM CHLORIDE (POUR BTL) OPTIME
TOPICAL | Status: DC | PRN
  Administered 2020-09-27: 1000 mL

## 2020-09-27 MED ORDER — FENTANYL CITRATE (PF) 250 MCG/5ML IJ SOLN
INTRAMUSCULAR | Status: DC | PRN
  Administered 2020-09-27: 50 ug via INTRAVENOUS
  Administered 2020-09-27: 150 ug via INTRAVENOUS
  Administered 2020-09-27: 50 ug via INTRAVENOUS

## 2020-09-27 MED ORDER — OXYCODONE HCL 5 MG PO TABS: 5.0000 mg | ORAL_TABLET | Freq: Once | ORAL | Status: DC | PRN

## 2020-09-27 MED ORDER — HYDROMORPHONE HCL 1 MG/ML IJ SOLN
0.2500 mg | INTRAMUSCULAR | Status: DC | PRN
  Administered 2020-09-27 (×4): 0.5 mg via INTRAVENOUS

## 2020-09-27 MED ORDER — THROMBIN 5000 UNITS EX SOLR: OROMUCOSAL | Status: DC | PRN

## 2020-09-27 MED ORDER — DEXAMETHASONE SODIUM PHOSPHATE 10 MG/ML IJ SOLN
INTRAMUSCULAR | Status: DC | PRN
  Administered 2020-09-27: 10 mg via INTRAVENOUS

## 2020-09-27 MED ORDER — MIDAZOLAM HCL 2 MG/2ML IJ SOLN
INTRAMUSCULAR | Status: DC | PRN
  Administered 2020-09-27: 2 mg via INTRAVENOUS

## 2020-09-27 MED ORDER — FENTANYL CITRATE (PF) 250 MCG/5ML IJ SOLN
INTRAMUSCULAR | Status: AC
  Filled 2020-09-27: qty 5

## 2020-09-27 MED ORDER — ONDANSETRON HCL 4 MG/2ML IJ SOLN
INTRAMUSCULAR | Status: DC | PRN
  Administered 2020-09-27: 4 mg via INTRAVENOUS

## 2020-09-27 MED ORDER — ACETAMINOPHEN 10 MG/ML IV SOLN
INTRAVENOUS | Status: DC | PRN
  Administered 2020-09-27: 1000 mg via INTRAVENOUS

## 2020-09-27 MED ORDER — CHLORHEXIDINE GLUCONATE 0.12 % MT SOLN
15.0000 mL | Freq: Once | OROMUCOSAL | Status: AC
  Administered 2020-09-27: 15 mL via OROMUCOSAL

## 2020-09-27 MED ORDER — SODIUM CHLORIDE 0.9% IV SOLUTION: Freq: Once | INTRAVENOUS | Status: DC

## 2020-09-27 MED ORDER — OXYCODONE HCL 5 MG/5ML PO SOLN: 5.0000 mg | Freq: Once | ORAL | Status: DC | PRN

## 2020-09-27 MED ORDER — KETOROLAC TROMETHAMINE 30 MG/ML IJ SOLN
30.0000 mg | Freq: Once | INTRAMUSCULAR | Status: AC
  Administered 2020-09-27: 30 mg via INTRAVENOUS

## 2020-09-27 MED ORDER — ONDANSETRON HCL 4 MG/2ML IJ SOLN: 4.0000 mg | Freq: Once | INTRAMUSCULAR | Status: DC | PRN

## 2020-09-27 MED ORDER — ROCURONIUM BROMIDE 10 MG/ML (PF) SYRINGE
PREFILLED_SYRINGE | INTRAVENOUS | Status: DC | PRN
  Administered 2020-09-27: 40 mg via INTRAVENOUS
  Administered 2020-09-27: 30 mg via INTRAVENOUS
  Administered 2020-09-27 (×3): 20 mg via INTRAVENOUS
  Administered 2020-09-27: 30 mg via INTRAVENOUS
  Administered 2020-09-27: 10 mg via INTRAVENOUS
  Administered 2020-09-27: 20 mg via INTRAVENOUS
  Administered 2020-09-27: 50 mg via INTRAVENOUS
  Administered 2020-09-27: 10 mg via INTRAVENOUS
  Administered 2020-09-27: 60 mg via INTRAVENOUS
  Administered 2020-09-27: 20 mg via INTRAVENOUS
  Administered 2020-09-27: 50 mg via INTRAVENOUS

## 2020-09-27 MED ORDER — LACTATED RINGERS IV SOLN: INTRAVENOUS | Status: DC

## 2020-09-27 MED ORDER — GLYCOPYRROLATE PF 0.2 MG/ML IJ SOSY
PREFILLED_SYRINGE | INTRAMUSCULAR | Status: DC | PRN
  Administered 2020-09-27: .2 mg via INTRAVENOUS

## 2020-09-27 MED ORDER — KETAMINE HCL 50 MG/5ML IJ SOSY
PREFILLED_SYRINGE | INTRAMUSCULAR | Status: AC
  Filled 2020-09-27: qty 5

## 2020-09-27 MED ORDER — PHENYLEPHRINE 40 MCG/ML (10ML) SYRINGE FOR IV PUSH (FOR BLOOD PRESSURE SUPPORT)
PREFILLED_SYRINGE | INTRAVENOUS | Status: DC | PRN
  Administered 2020-09-27: 120 ug via INTRAVENOUS
  Administered 2020-09-27: 80 ug via INTRAVENOUS
  Administered 2020-09-27 (×2): 40 ug via INTRAVENOUS
  Administered 2020-09-27: 80 ug via INTRAVENOUS
  Administered 2020-09-27: 40 ug via INTRAVENOUS

## 2020-09-27 MED ORDER — BUPIVACAINE HCL (PF) 0.25 % IJ SOLN
INTRAMUSCULAR | Status: AC
  Filled 2020-09-27: qty 30

## 2020-09-27 MED ORDER — KETAMINE HCL 10 MG/ML IJ SOLN
INTRAMUSCULAR | Status: DC | PRN
  Administered 2020-09-27: 30 mg via INTRAVENOUS

## 2020-09-27 MED ORDER — PROPOFOL 10 MG/ML IV BOLUS
INTRAVENOUS | Status: DC | PRN
  Administered 2020-09-27: 140 mg via INTRAVENOUS

## 2020-09-27 MED ORDER — THROMBIN 20000 UNITS EX SOLR
CUTANEOUS | Status: AC
  Filled 2020-09-27: qty 20000

## 2020-09-27 MED ORDER — LIDOCAINE 2% (20 MG/ML) 5 ML SYRINGE
INTRAMUSCULAR | Status: DC | PRN
  Administered 2020-09-27: 100 mg via INTRAVENOUS

## 2020-09-27 MED ORDER — VANCOMYCIN HCL 1000 MG IV SOLR
INTRAVENOUS | Status: DC | PRN
  Administered 2020-09-27: 1000 mg via TOPICAL

## 2020-09-27 MED ORDER — DEXMEDETOMIDINE HCL IN NACL 200 MCG/50ML IV SOLN
INTRAVENOUS | Status: DC | PRN
  Administered 2020-09-27: .5 ug/kg/h via INTRAVENOUS

## 2020-09-27 SURGICAL SUPPLY — 60 items
ADH SKN CLS APL DERMABOND .7 (GAUZE/BANDAGES/DRESSINGS) ×2
APL SKNCLS STERI-STRIP NONHPOA (GAUZE/BANDAGES/DRESSINGS) ×2
BASKET BONE COLLECTION (BASKET) ×3 IMPLANT
BENZOIN TINCTURE PRP APPL 2/3 (GAUZE/BANDAGES/DRESSINGS) ×3 IMPLANT
BONE CANC CHIPS 40CC CAN1/2 (Bone Implant) ×3 IMPLANT
BUR CARBIDE MATCH 3.0 (BURR) ×3 IMPLANT
CANISTER SUCT 3000ML PPV (MISCELLANEOUS) ×3 IMPLANT
CHIPS CANC BONE 40CC CAN1/2 (Bone Implant) ×2 IMPLANT
CNTNR URN SCR LID CUP LEK RST (MISCELLANEOUS) ×2 IMPLANT
CONNECTOR OPEN ILIAC 30 (Connector) ×6 IMPLANT
CONT SPEC 4OZ STRL OR WHT (MISCELLANEOUS) ×3
COVER BACK TABLE 60X90IN (DRAPES) ×6 IMPLANT
DERMABOND ADVANCED (GAUZE/BANDAGES/DRESSINGS) ×1
DERMABOND ADVANCED .7 DNX12 (GAUZE/BANDAGES/DRESSINGS) ×2 IMPLANT
DIFFUSER DRILL AIR PNEUMATIC (MISCELLANEOUS) ×3 IMPLANT
DRAPE C-ARM 42X72 X-RAY (DRAPES) IMPLANT
DRAPE LAPAROTOMY 100X72X124 (DRAPES) ×3 IMPLANT
DRAPE SCAN PATIENT (DRAPES) ×6 IMPLANT
DRAPE SURG 17X23 STRL (DRAPES) ×3 IMPLANT
DRSG OPSITE POSTOP 3X4 (GAUZE/BANDAGES/DRESSINGS) ×3 IMPLANT
DRSG OPSITE POSTOP 4X12 (GAUZE/BANDAGES/DRESSINGS) ×3 IMPLANT
DURAPREP 26ML APPLICATOR (WOUND CARE) ×3 IMPLANT
ELECT REM PT RETURN 9FT ADLT (ELECTROSURGICAL) ×3
ELECTRODE REM PT RTRN 9FT ADLT (ELECTROSURGICAL) ×2 IMPLANT
EVACUATOR 1/8 PVC DRAIN (DRAIN) ×3 IMPLANT
GLOVE BIO SURGEON STRL SZ7 (GLOVE) ×6 IMPLANT
GLOVE BIO SURGEON STRL SZ8 (GLOVE) ×6 IMPLANT
GLOVE BIOGEL PI IND STRL 7.0 (GLOVE) ×4 IMPLANT
GLOVE BIOGEL PI INDICATOR 7.0 (GLOVE) ×2
GOWN STRL REUS W/ TWL LRG LVL3 (GOWN DISPOSABLE) ×4 IMPLANT
GOWN STRL REUS W/ TWL XL LVL3 (GOWN DISPOSABLE) ×12 IMPLANT
GOWN STRL REUS W/TWL LRG LVL3 (GOWN DISPOSABLE) ×6
GOWN STRL REUS W/TWL XL LVL3 (GOWN DISPOSABLE) ×18
GRAFT TRINITY ELITE LGE HUMAN (Tissue) ×3 IMPLANT
HEMOSTAT POWDER KIT SURGIFOAM (HEMOSTASIS) ×3 IMPLANT
KIT BASIN OR (CUSTOM PROCEDURE TRAY) ×3 IMPLANT
KIT INFUSE SMALL (Orthopedic Implant) ×3 IMPLANT
KIT TURNOVER KIT B (KITS) ×3 IMPLANT
MARKER SPHERE PSV REFLC 13MM (MARKER) ×21 IMPLANT
NEEDLE HYPO 25X1 1.5 SAFETY (NEEDLE) ×3 IMPLANT
NS IRRIG 1000ML POUR BTL (IV SOLUTION) ×3 IMPLANT
PACK LAMINECTOMY NEURO (CUSTOM PROCEDURE TRAY) ×3 IMPLANT
ROD LORDOTIC TI 5.5X75 (Rod) ×6 IMPLANT
SCREW ILIAC POLYAXIAL 8.5X80 (Screw) ×6 IMPLANT
SCREW KODIAK 6.5X40 (Screw) ×6 IMPLANT
SCREW KODIAK 6.5X45 (Screw) ×3 IMPLANT
SCREW KODIAK 6.5X50MM (Screw) ×6 IMPLANT
SET SCREW (Screw) ×27 IMPLANT
SET SCREW SPNE (Screw) ×18 IMPLANT
SPONGE LAP 4X18 RFD (DISPOSABLE) IMPLANT
SPONGE SURGIFOAM ABS GEL 100 (HEMOSTASIS) ×3 IMPLANT
STRIP CLOSURE SKIN 1/2X4 (GAUZE/BANDAGES/DRESSINGS) ×3 IMPLANT
SUT VIC AB 0 CT1 18XCR BRD8 (SUTURE) ×2 IMPLANT
SUT VIC AB 0 CT1 8-18 (SUTURE) ×3
SUT VIC AB 2-0 CP2 18 (SUTURE) ×3 IMPLANT
SUT VIC AB 3-0 SH 8-18 (SUTURE) ×6 IMPLANT
TOWEL GREEN STERILE (TOWEL DISPOSABLE) ×3 IMPLANT
TOWEL GREEN STERILE FF (TOWEL DISPOSABLE) ×3 IMPLANT
TRAY FOLEY MTR SLVR 16FR STAT (SET/KITS/TRAYS/PACK) ×3 IMPLANT
WATER STERILE IRR 1000ML POUR (IV SOLUTION) ×3 IMPLANT

## 2020-09-27 NOTE — Progress Notes (Signed)
Pt off unit to OR. Report called to short-stay, pre-procedure checklist completed, and second CHG bath given prior to transport. Pt wearing glasses while going downstairs, and pt given bag with name and room number to put glasses in during surgery.   Justice Rocher, RN

## 2020-09-27 NOTE — H&P (Signed)
Subjective: Patient is a 64 y.o. male who complains of severe right leg pain with movement. Onset of symptoms was a few days ago, rapidly worsening since that time.  He had an anterior lumbar interbody fusion with posterior decompression and instrumentation last week.  Plain films look like he had hardware failure.  CT scan confirmed a sacral fracture with failure of instrumentation.  The pain is rated intense, and is located at the across the lower back especially in the right lower extremity with movement trying to get out of bed.  Once he is up the pain is somewhat better. the pain is described as aching and stabbing and occurs intermittently. The symptoms denies been progressive. Symptoms are exacerbated by Getting up. The patient has tried Neurontin, Nucynta, NSAIDs and oral steroid.   Past Medical History:  Diagnosis Date  . Back pain   . BPH (benign prostatic hyperplasia)   . Extensor tendon laceration, finger, open wound, initial encounter    left thumb  . Hypertension   . Hypothyroidism   . Macrocytosis   . Numbness and tingling   . Pars defect with spondylolisthesis     Past Surgical History:  Procedure Laterality Date  . ABDOMINAL EXPOSURE N/A 09/19/2020   Procedure: ABDOMINAL EXPOSURE;  Surgeon: Rosetta Posner, MD;  Location: [facility] OR;  Service: Vascular;  Laterality: N/A;  anterior  . ANTERIOR LUMBAR FUSION N/A 09/19/2020   Procedure: Anterior Lumbar Interbody Fusion Lumbar Five-Sacral One, posterior lateral fusion with gill decompression Lumbar Five-Sacral One.;  Surgeon: Eustace Moore, MD;  Location: [place];  Service: Neurosurgery;  Laterality: N/A;  anterior  . COLONOSCOPY WITH PROPOFOL N/A 12/18/2018   Procedure: COLONOSCOPY WITH BIOPSY;  Surgeon: Lucilla Lame, MD;  Location: [place];  Service: Endoscopy;  Laterality: N/A;  NEEDS TO STAY FIRST  . ESOPHAGOGASTRODUODENOSCOPY    . ESOPHAGOGASTRODUODENOSCOPY (EGD) WITH PROPOFOL N/A 02/23/2019   Procedure:  ESOPHAGOGASTRODUODENOSCOPY (EGD) WITH PROPOFOL;  Surgeon: Lucilla Lame, MD;  Location: [facility] ENDOSCOPY;  Service: Endoscopy;  Laterality: N/A;  . HERNIA REPAIR    . LAMINECTOMY WITH POSTERIOR LATERAL ARTHRODESIS LEVEL 1 N/A 09/19/2020   Procedure: LAMINECTOMY WITH POSTERIOR LATERAL ARTHRODESIS LEVEL 1;  Surgeon: Eustace Moore, MD;  Location: [place];  Service: Neurosurgery;  Laterality: N/A;  posterior  . LIPOMA EXCISION Right    upper arm  . POLYPECTOMY N/A 12/18/2018   Procedure: POLYPECTOMY;  Surgeon: Lucilla Lame, MD;  Location: [place];  Service: Endoscopy;  Laterality: N/A;  . TENDON REPAIR Left 11/05/2019   Procedure: LEFT THUMB TENDON REPAIR;  Surgeon: Cindra Presume, MD;  Location: [place];  Service: Plastics;  Laterality: Left;  Axillary block, MAC  . UMBILICAL HERNIA REPAIR      No Known Allergies  Social History   Tobacco Use  . Smoking status: Never Smoker  . Smokeless tobacco: Never Used  Substance Use Topics  . Alcohol use: Not Currently    History reviewed. No pertinent family history. Prior to Admission medications   Medication Sig Start Date End Date Taking? Authorizing Provider  gabapentin (NEURONTIN) 300 MG capsule Take 300 mg by mouth 3 (three) times daily. 09/25/20  Yes [provider]  hydrochlorothiazide (MICROZIDE) 12.5 MG capsule Take 12.5 mg by mouth daily. 05/25/20  Yes [provider]  levothyroxine (SYNTHROID) 100 MCG tablet Take 100 mcg by mouth daily before breakfast.  08/27/19  Yes [provider]  losartan (COZAAR) 50 MG tablet Take 50 mg by mouth  daily.  03/01/20  Yes [provider]  methocarbamol (ROBAXIN) 500 MG tablet Take 1 tablet (500 mg total) by mouth every 6 (six) hours as needed for muscle spasms. 09/19/20  Yes Eustace Moore, MD  methylPREDNISolone (MEDROL DOSEPAK) 4 MG TBPK tablet Take 4 mg by mouth as directed. 6 day dose pack 09/21/20  Yes [provider]  NUCYNTA 100 MG  TABS Take 100 mg by mouth every 4 (four) hours as needed for pain. 09/25/20  Yes [provider]  ondansetron (ZOFRAN) 4 MG tablet Take 1 tablet (4 mg total) by mouth every 6 (six) hours as needed for nausea or vomiting. 09/19/20  Yes Eustace Moore, MD  pantoprazole (PROTONIX) 40 MG tablet Take 40 mg by mouth daily.  03/01/20  Yes [provider]  tapentadol (NUCYNTA) 75 MG tablet Take 1 tablet (75 mg total) by mouth every 6 (six) hours as needed for moderate pain. Patient not taking: Reported on 09/26/2020 09/19/20   Eustace Moore, MD     Review of Systems  Positive ROS: Negative  All other systems have been reviewed and were otherwise negative with the exception of those mentioned in the HPI and as above.  Objective: Vital signs in last 24 hours: Temp:  [97.5 F (36.4 C)-99.2 F (37.3 C)] 99.2 F (37.3 C) (12/08 1133) Pulse Rate:  [55-64] 60 (12/08 1133) Resp:  [14-16] 16 (12/08 1133) BP: (105-122)/(72-86) 114/72 (12/08 1133) SpO2:  [91 %-98 %] 92 % (12/08 1133)  General Appearance: Alert, cooperative, no distress, appears stated age Head: Normocephalic, without obvious abnormality, atraumatic Eyes: PERRL, conjunctiva/corneas clear, EOM's intact, fundi benign, both eyes      Ears: Normal TM's and external ear canals, both ears Throat: Lips, mucosa, and tongue normal; teeth and gums normal Neck: Supple, symmetrical, trachea midline, no adenopathy; thyroid: No enlargement/tenderness/nodules; no carotid bruit or JVD Back: Symmetric, no curvature, ROM normal, no CVA tenderness Lungs: Clear to auscultation bilaterally, respirations unlabored Heart: Regular rate and rhythm, S1 and S2 normal, no murmur, rub or gallop Abdomen: Soft, non-tender, bowel sounds active all four quadrants, no masses, no organomegaly Extremities: Extremities normal, atraumatic, no cyanosis or edema Pulses: 2+ and symmetric all extremities Skin: Skin color, texture, turgor normal, no rashes or  lesions  NEUROLOGIC:   Mental status: alert and oriented, no aphasia, good attention span, Fund of knowledge/ memory ok Motor Exam - grossly normal Sensory Exam - grossly normal Reflexes:  Coordination - grossly normal Gait - grossly normal Balance - grossly normal Cranial Nerves: I: smell Not tested  II: visual acuity  OS: na    OD: na  II: visual fields Full to confrontation  II: pupils Equal, round, reactive to light  III,VII: ptosis None  III,IV,VI: extraocular muscles  Full ROM  V: mastication Normal  V: facial light touch sensation  Normal  V,VII: corneal reflex  Present  VII: facial muscle function - upper  Normal  VII: facial muscle function - lower Normal  VIII: hearing Not tested  IX: soft palate elevation  Normal  IX,X: gag reflex Present  XI: trapezius strength  5/5  XI: sternocleidomastoid strength 5/5  XI: neck flexion strength  5/5  XII: tongue strength  Normal    Data Review Lab Results  Component Value Date   WBC 5.6 09/15/2020   HGB 15.1 09/15/2020   HCT 44.7 09/15/2020   MCV 102.1 (H) 09/15/2020   PLT 220 09/15/2020   Lab Results  Component Value Date  NA 140 09/15/2020   K 3.6 09/15/2020   CL 108 09/15/2020   CO2 24 09/15/2020   BUN 18 09/15/2020   CREATININE 0.88 09/15/2020   GLUCOSE 110 (H) 09/15/2020   Lab Results  Component Value Date   INR 1.0 09/15/2020    Assessment/Plan: Patient is admitted for pain control.  Plan is for posterior lumbar fusion L4-S1 with revision of instrumentation and extension up to L4 region L3 and down to the ileum.  I have tried to describe this to him as best I can.  He understands the risk include but are not limited to bleeding, infection, CSF leak, nerve root injury, numbness, weakness, bowel bladder or sexual function, need for further surgery, instrumentation failure, misplaced instrumentation, stable disease, lack of relief of symptoms, worsening symptoms, and anesthesia risk.  He agrees to  proceed.   Eustace Moore 09/27/2020 12:38 PM

## 2020-09-27 NOTE — Anesthesia Procedure Notes (Signed)
Arterial Line Insertion Start/End12/05/2020 4:35 PM Performed by: Verdie Drown, CRNA, CRNA  Patient location: OR. Preanesthetic checklist: patient identified, IV checked, risks and benefits discussed, surgical consent, monitors and equipment checked and pre-op evaluation Lidocaine 1% used for infiltration Left, radial was placed Catheter size: 20 G Hand hygiene performed  and maximum sterile barriers used  Allen's test indicative of satisfactory collateral circulation Attempts: 1 Procedure performed without using ultrasound guided technique. Following insertion, dressing applied and Biopatch. Post procedure assessment: normal  Patient tolerated the procedure well with no immediate complications.

## 2020-09-27 NOTE — Transfer of Care (Signed)
Immediate Anesthesia Transfer of Care Note  Patient: Alexander Rojas  Procedure(s) Performed: Lumbar exploration with Extension of Posterior Instrumentation Lumbar Four to the [place] with Airo (N/A Spine Lumbar) APPLICATION OF INTRAOPERATIVE CT SCAN (N/A )  Patient Location: PACU  Anesthesia Type:General  Level of Consciousness: awake, alert  and oriented  Airway & Oxygen Therapy: Patient connected to face mask oxygen  Post-op Assessment: Report given to RN and Post -op Vital signs reviewed and stable  Post vital signs: Reviewed and stable  Last Vitals:  Vitals Value Taken Time  BP 94/55 09/27/20 2238  Temp    Pulse 59 09/27/20 2239  Resp 15 09/27/20 2239  SpO2 97 % 09/27/20 2239  Vitals shown include unvalidated device data.  Last Pain:  Vitals:   09/27/20 1133  TempSrc: Oral  PainSc:       Patients Stated Pain Goal: 2 (24/26/83 4196)  Complications: No complications documented.

## 2020-09-27 NOTE — Anesthesia Preprocedure Evaluation (Addendum)
Anesthesia Evaluation  Patient identified by MRN, date of birth, ID band Patient awake    Reviewed: Allergy & Precautions, NPO status , Patient's Chart, lab work & pertinent test results  Airway Mallampati: II  TM Distance: >3 FB Neck ROM: Full    Dental no notable dental hx. (+) Teeth Intact, Dental Advisory Given   Pulmonary neg pulmonary ROS,    Pulmonary exam normal breath sounds clear to auscultation       Cardiovascular hypertension, Normal cardiovascular exam Rhythm:Regular Rate:Normal     Neuro/Psych negative neurological ROS  negative psych ROS   GI/Hepatic negative GI ROS, Neg liver ROS,   Endo/Other  Hypothyroidism   Renal/GU negative Renal ROS     Musculoskeletal negative musculoskeletal ROS (+)   Abdominal   Peds  Hematology   Anesthesia Other Findings   Reproductive/Obstetrics                            Anesthesia Physical Anesthesia Plan  ASA: II  Anesthesia Plan: General   Post-op Pain Management:    Induction: Intravenous  PONV Risk Score and Plan: 3 and Treatment may vary due to age or medical condition, Ondansetron, Dexamethasone and Midazolam  Airway Management Planned: Oral ETT  Additional Equipment: Arterial line  Intra-op Plan:   Post-operative Plan: Extubation in OR  Informed Consent: I have reviewed the patients History and Physical, chart, labs and discussed the procedure including the risks, benefits and alternatives for the proposed anesthesia with the patient or authorized representative who has indicated his/her understanding and acceptance.     Dental advisory given  Plan Discussed with: CRNA and Anesthesiologist  Anesthesia Plan Comments:        Anesthesia Quick Evaluation

## 2020-09-27 NOTE — Op Note (Signed)
09/27/2020  10:19 PM  PATIENT:  Alexander Rojas  64 y.o. male  PRE-OPERATIVE DIAGNOSIS: Failure of anterior and posterior instrumentation after L5-S1 360 fusion with back and right leg pain  POST-OPERATIVE DIAGNOSIS:  same  PROCEDURE: 1.Lumbar reexploration with removal of instrumentation L5-S1, 2.  repeat decompression of both L5 nerve roots, 3.  intertransverse arthrodesis L4-S1 bilaterally utilizing locally harvested morselized autologous bone graft mixed with a small BMP-soaked sponge and morselized allograft, 3.  Segmental fixation L4 to the ileum bilaterally utilizing Alphatec pedicle screws placed utilizing Aero navigation  SURGEON:  Sherley Bounds, MD  ASSISTANTS: Kary Kos MD  ANESTHESIA:   General  EBL: 450 ml  Total I/O In: 2400 [I.V.:2400] Out: 4270 [Urine:1175; Blood:450]  BLOOD ADMINISTERED: none  DRAINS: Medium Hemovac  SPECIMEN:  none  INDICATION FOR PROCEDURE: This patient presented with severe right leg pain a week after a anterior lumbar interbody fusion L5-S1 with posterior decompression and instrumented fusion. Imaging showed failure of the construct with fracture of the superior endplate of S1 and loosening of the posterior hardware.  Pain was debilitating. Recommended lumbar reexploration with removal of the loosened hardware and placement of new instrumentation L4- pelvis. Patient understood the risks, benefits, and alternatives and potential outcomes and wished to proceed.  PROCEDURE DETAILS: The patient was taken the operating room and after induction of adequate generalized endotracheal anesthesia he was rolled into the position on the carbon fiber table used for frameless stereotactic navigation for spine surgery.  Instruments were calibrated.  We marked our incision with AP fluoroscopy.  We checked the construct with a lateral fluoroscopy.  We then cleaned the patient with Betadine scrub and prepped with DuraPrep and then draped in the usual sterile fashion.   The old incision was opened and lengthened, and I dissected down to the fascia and opened the fascia and took down the musculature in a subperiosteal fashion to expose L3-4 L4-5 and L5-S1.  I exposed the old instrumentation.  I remove the locking caps.  The L5 and S1 pedicle screws were loose on the right.  There was good purchase of the left S1 screw but the L4 screw had backed out some.  We remove the rods and removed all 4 screws.  I skeletonized the L5 pedicle on both sides and removed more of the medial pedicle and inferior lamina and ligament and dissected out into the foramen distally removing ligament and facet until the L5 nerve roots were completely skeletonized bilaterally.    I then dissected out over the transverse processes of L4-L5 and the sacral ala.  I then dissected in a suprafascial plane to expose the posterior superior iliac spine on each side.  I opened the fascia over this and exposed the PSIS.  We then made a small incision over the T12 spinous process and placed our clamp and registered our fiducials.  We then draped this and performed our first spin.  All instruments were then calibrated.  We return to the operating room after scrubbing once again.  We removed part of the PSIS bilaterally so that the head of the iliac screw would not be proud.  We then used frameless guidance to marked our entry point for our screw, probed with a guided probe, tapped with a guided 7 5 tap to 80 mm, and then placed a eight 5 x 80 mm iliac bolt into the PSIS into the crest bilaterally utilizing frameless stereotactic navigation.  We then went back to the lumbar region and started  at L4.  We considered going to L3 but thought we would try to get good purchase at L4 first.  We marked our pedicle screw entry zone utilizing the frameless stereotactic guidance.  We then used a guided probe to probe each L4 pedicle and then palpated the pedicle with a ball probe.  We then tapped with a 5 5 tap that was also  guided.  Then palpated with a ball probe once again and then placed our L4 pedicle screws utilizing image guidance.   Then turned our attention to the L5 screws.  We knew this would be extremely difficult to place as the pedicles were very small and his bone was quite soft.  We started lateral to the old pedicle screw entry zones and tried to use frameless guidance to probe and then tapped our pedicle.  Again the pedicle was quite small and we were only able to place a screw on the left-hand side.  The right pedicle would not except the pedicle screw.  We placed a 6.5 x 45 mm screw on the patient's left.  We then went to the sacrum went lateral to her old sacral pedicle screw holes, and once again utilizing image guidance we probed, tapped, and placed our screws.  Here we tapped with a 5 5 tap and placed 6.5 x 40 mm pedicle screws into the sacrum.  Once our screws were in place we ran another spin and checked our CT scan.  We were quite happy with the iliac bolts and we were very happy with the screws except for the right L5 screw.  This was lateral to the pedicle deep.  Therefore we decided to reangle the screw.  We scrubbed back in and placed our retractor and remove the left L5 pedicle screw.  We then started somewhat medially finding our entry zone with image guidance, and then we probed and tapped the pedicle once again.  We then placed a six 5 x 45 mm pedicle screw and we felt like it had a more medialized and inferior trajectory than previously.  Then decorticated the transverse processes bilaterally from L4 to the sacrum.  I placed a small BMP that was cut in half and half was used on each side.  I then placed morselized allograft and cortical cancellous chips to perform arthrodesis L4-S1 bilaterally.  Then placed a connector through the fascia into the iliac bolt bilaterally and locked it into position with a locking cap.  We then bent our rods into a short lordosis to match his lordosis, we  placed these into the pedicle screw heads and locked them into place with the locking caps and the antitorque device.  We also locked down ON our iliac bolts.  Checked our final construct with AP and lateral fluoroscopy.  We were happier with the L5 pedicle screw.  We were happy with our construct.  We irrigated with saline solution containing bacitracin.  Lined the dura with Gelfoam.  I placed powdered vancomycin into the wound and placed a medium Hemovac drain through a separate stab incision..  We then closed both wounds in layers of 0 Vicryl in the fascia, 2-0 Vicryl in the subcutaneous tissues, and 3-0 Vicryl in the subcuticular tissues.  The thoracic incision was closed with Dermabond and the lumbar incision was closed with Dermabond on the skin followed by Steri-Strips and a sterile dressing.  The drapes were removed.  The patient was then awakened from general anesthesia and transferred recovery in stable condition.  At the end of the procedure all sponge needle and instrument counts were correct.  PLAN OF CARE: Admit to inpatient   PATIENT DISPOSITION:  PACU - hemodynamically stable.   Delay start of Pharmacological VTE agent (>24hrs) due to surgical blood loss or risk of bleeding:  yes

## 2020-09-27 NOTE — Patient Outreach (Signed)
[facility] - Inpatient) Care Management  09/27/2020  DELMER KOWALSKI 1956/01/09 [id number]   Care Coordination    Transition of care   Referral received:09/19/20 Initial outreach:09/22/20 Insurance: [employer]    Patient with readmission 09/25/20 at [facility] Liaison of readmission will follow for discharge disposition plans.    Joylene Draft, RN, BSN  [place] Management Coordinator  [phone]- Mobile [phone]- Toll Free Main Office

## 2020-09-27 NOTE — Anesthesia Procedure Notes (Signed)
Procedure Name: Intubation Date/Time: 09/27/2020 4:34 PM Performed by: Janace Litten, CRNA Pre-anesthesia Checklist: Patient identified, Emergency Drugs available, Suction available and Patient being monitored Patient Re-evaluated:Patient Re-evaluated prior to induction Oxygen Delivery Method: Circle System Utilized Preoxygenation: Pre-oxygenation with 100% oxygen Induction Type: IV induction Ventilation: Mask ventilation without difficulty Laryngoscope Size: Mac and 4 Grade View: Grade I Tube type: Oral Tube size: 7.5 mm Number of attempts: 1 Airway Equipment and Method: Stylet Placement Confirmation: ETT inserted through vocal cords under direct vision,  positive ETCO2 and breath sounds checked- equal and bilateral Secured at: 24 cm Tube secured with: Tape Dental Injury: Teeth and Oropharynx as per pre-operative assessment

## 2020-09-27 NOTE — Progress Notes (Signed)
Pt states that he is in excruciating pain anytime he moves. Dilaudid given prior to readjusting pt in bed. Pt refused rolling, even though the sheets under him are wet. He says that he would prefer to not move until surgery and then just have all new bedding tomorrow. Pt also states that he has not had a bm in a week - due to his inability to move.  Justice Rocher, RN

## 2020-09-28 MED ORDER — METHOCARBAMOL 500 MG PO TABS
500.0000 mg | ORAL_TABLET | Freq: Four times a day (QID) | ORAL | Status: DC | PRN
  Administered 2020-09-28: 500 mg via ORAL
  Filled 2020-09-28 (×2): qty 1

## 2020-09-28 MED ORDER — MENTHOL 3 MG MT LOZG
1.0000 | LOZENGE | OROMUCOSAL | Status: DC | PRN
  Filled 2020-09-28: qty 9

## 2020-09-28 MED ORDER — CALCIUM CARBONATE-VITAMIN D 500-200 MG-UNIT PO TABS
1.0000 | ORAL_TABLET | Freq: Every day | ORAL | Status: DC
  Administered 2020-09-28 – 2020-10-10 (×13): 1 via ORAL
  Filled 2020-09-28 (×15): qty 1

## 2020-09-28 MED ORDER — ONDANSETRON HCL 4 MG/2ML IJ SOLN: 4.0000 mg | Freq: Four times a day (QID) | INTRAMUSCULAR | Status: DC | PRN

## 2020-09-28 MED ORDER — DIAZEPAM 5 MG/ML IJ SOLN
2.5000 mg | INTRAMUSCULAR | Status: DC | PRN
  Filled 2020-09-28: qty 2

## 2020-09-28 MED ORDER — SODIUM CHLORIDE 0.9% FLUSH: 3.0000 mL | INTRAVENOUS | Status: DC | PRN

## 2020-09-28 MED ORDER — DIAZEPAM 5 MG/ML IJ SOLN
1.0000 mg | INTRAMUSCULAR | Status: DC | PRN
  Administered 2020-09-28: 1 mg via INTRAVENOUS
  Administered 2020-09-28: 2 mg via INTRAVENOUS
  Administered 2020-09-28: 1 mg via INTRAVENOUS
  Administered 2020-09-28 (×2): 2 mg via INTRAVENOUS
  Administered 2020-09-29: 1 mg via INTRAVENOUS
  Administered 2020-09-29 – 2020-10-02 (×11): 2 mg via INTRAVENOUS
  Filled 2020-09-28 (×19): qty 2

## 2020-09-28 MED ORDER — HYDROMORPHONE HCL 1 MG/ML IJ SOLN
1.0000 mg | INTRAMUSCULAR | Status: DC | PRN
  Administered 2020-09-28: 1 mg via INTRAVENOUS
  Filled 2020-09-28: qty 1

## 2020-09-28 MED ORDER — CHLORHEXIDINE GLUCONATE CLOTH 2 % EX PADS
6.0000 | MEDICATED_PAD | Freq: Every day | CUTANEOUS | Status: DC
  Administered 2020-09-28 – 2020-10-10 (×9): 6 via TOPICAL

## 2020-09-28 MED ORDER — ONDANSETRON HCL 4 MG PO TABS: 4.0000 mg | ORAL_TABLET | Freq: Four times a day (QID) | ORAL | Status: DC | PRN

## 2020-09-28 MED ORDER — SODIUM CHLORIDE 0.9 % IV SOLN: 250.0000 mL | INTRAVENOUS | Status: DC

## 2020-09-28 MED ORDER — DEXAMETHASONE 4 MG PO TABS
4.0000 mg | ORAL_TABLET | Freq: Four times a day (QID) | ORAL | Status: DC
  Administered 2020-09-28 – 2020-10-01 (×7): 4 mg via ORAL
  Filled 2020-09-28 (×8): qty 1

## 2020-09-28 MED ORDER — SODIUM CHLORIDE 0.9% FLUSH
3.0000 mL | Freq: Two times a day (BID) | INTRAVENOUS | Status: DC
  Administered 2020-09-28 – 2020-10-10 (×23): 3 mL via INTRAVENOUS

## 2020-09-28 MED ORDER — ACETAMINOPHEN 325 MG PO TABS
650.0000 mg | ORAL_TABLET | ORAL | Status: DC | PRN
  Administered 2020-10-09 (×2): 650 mg via ORAL
  Filled 2020-09-28 (×3): qty 2

## 2020-09-28 MED ORDER — ACETAMINOPHEN 650 MG RE SUPP: 650.0000 mg | RECTAL | Status: DC | PRN

## 2020-09-28 MED ORDER — DEXAMETHASONE SODIUM PHOSPHATE 4 MG/ML IJ SOLN
4.0000 mg | Freq: Four times a day (QID) | INTRAMUSCULAR | Status: DC
  Administered 2020-09-28 – 2020-09-30 (×7): 4 mg via INTRAVENOUS
  Filled 2020-09-28 (×8): qty 1

## 2020-09-28 MED ORDER — HYDROMORPHONE HCL 2 MG PO TABS
2.0000 mg | ORAL_TABLET | ORAL | Status: DC | PRN
  Administered 2020-09-28 – 2020-09-29 (×3): 2 mg via ORAL
  Filled 2020-09-28 (×3): qty 1

## 2020-09-28 MED ORDER — HYDROMORPHONE HCL 1 MG/ML IJ SOLN
1.0000 mg | INTRAMUSCULAR | Status: DC | PRN
  Administered 2020-09-28 (×2): 2 mg via INTRAVENOUS
  Filled 2020-09-28 (×2): qty 2

## 2020-09-28 MED ORDER — METHOCARBAMOL 1000 MG/10ML IJ SOLN: 500.0000 mg | Freq: Four times a day (QID) | INTRAVENOUS | Status: DC | PRN

## 2020-09-28 MED ORDER — PHENOL 1.4 % MT LIQD: 1.0000 | OROMUCOSAL | Status: DC | PRN

## 2020-09-28 MED ORDER — SENNA 8.6 MG PO TABS
1.0000 | ORAL_TABLET | Freq: Two times a day (BID) | ORAL | Status: DC
  Administered 2020-10-02 – 2020-10-08 (×12): 8.6 mg via ORAL
  Filled 2020-09-28 (×20): qty 1

## 2020-09-28 MED ORDER — POTASSIUM CHLORIDE IN NACL 20-0.9 MEQ/L-% IV SOLN
INTRAVENOUS | Status: DC
  Filled 2020-09-28: qty 1000

## 2020-09-28 MED ORDER — HYDROMORPHONE HCL 1 MG/ML IJ SOLN
1.0000 mg | INTRAMUSCULAR | Status: DC | PRN
  Administered 2020-09-28 – 2020-09-29 (×5): 1 mg via INTRAVENOUS
  Filled 2020-09-28 (×5): qty 1

## 2020-09-28 MED ORDER — CELECOXIB 200 MG PO CAPS
200.0000 mg | ORAL_CAPSULE | Freq: Two times a day (BID) | ORAL | Status: DC
  Administered 2020-09-28 – 2020-10-04 (×12): 200 mg via ORAL
  Filled 2020-09-28 (×13): qty 1

## 2020-09-28 MED ORDER — CEFAZOLIN SODIUM-DEXTROSE 2-4 GM/100ML-% IV SOLN
2.0000 g | Freq: Three times a day (TID) | INTRAVENOUS | Status: AC
  Administered 2020-09-28 (×2): 2 g via INTRAVENOUS
  Filled 2020-09-28 (×3): qty 100

## 2020-09-28 MED ORDER — HYDROMORPHONE HCL 1 MG/ML IJ SOLN: 1.0000 mg | INTRAMUSCULAR | Status: DC

## 2020-09-28 NOTE — Progress Notes (Addendum)
Subjective: Patient reports pain is a little more under control this morning. Hasnt had anything since 3am. Most of his pain is in his back and right posterior thigh.   Objective: Vital signs in last 24 hours: Temp:  [98 F (36.7 C)-99.2 F (37.3 C)] 98.1 F (36.7 C) (12/09 0318) Pulse Rate:  [55-65] 65 (12/09 0318) Resp:  [5-16] 10 (12/09 0318) BP: (91-114)/(55-84) 94/64 (12/09 0318) SpO2:  [92 %-100 %] 96 % (12/09 0318) Arterial Line BP: (86-141)/(46-72) 101/53 (12/09 0000)  Intake/Output from previous day: 12/08 0701 - 12/09 0700 In: 2750 [I.V.:2700; IV Piggyback:50] Out: 6599 [Urine:1175; Blood:600] Intake/Output this shift: No intake/output data recorded.  Neurologic: Grossly normal  Lab Results: Lab Results  Component Value Date   WBC 5.6 09/15/2020   HGB 15.1 09/15/2020   HCT 44.7 09/15/2020   MCV 102.1 (H) 09/15/2020   PLT 220 09/15/2020   Lab Results  Component Value Date   INR 1.0 09/15/2020   BMET Lab Results  Component Value Date   NA 140 09/15/2020   K 3.6 09/15/2020   CL 108 09/15/2020   CO2 24 09/15/2020   GLUCOSE 110 (H) 09/15/2020   BUN 18 09/15/2020   CREATININE 0.88 09/15/2020   CALCIUM 8.8 (L) 09/15/2020    Studies/Results: DG Lumbar Spine 2-3 Views  Result Date: 09/27/2020 CLINICAL DATA:  Back surgery EXAM: LUMBAR SPINE - 2-3 VIEW; DG C-ARM 1-60 MIN COMPARISON:  09/26/2020, CT 09/25/2020 FINDINGS: Three low resolution intraoperative spot views of the lumbar spine. Total fluoroscopy time was 17.9 seconds. Images demonstrate posterior rods and fixating screws from L4 through the sacrum with screws projecting over the bilateral iliac bones. Interbody device at L5-S1. screw tip at L5 projects over the superior endplate. IMPRESSION: Intraoperative fluoroscopic assistance provided during lumbar spine surgery. Electronically Signed   By: Donavan Foil M.D.   On: 09/27/2020 22:07   DG Spine Portable 1 View  Result Date: 09/26/2020 CLINICAL DATA:   Postoperative evaluation with concern for hardware failure. EXAM: PORTABLE SPINE - 1 VIEW COMPARISON:  Lumbar spine CT, dated September 26, 2019 and intraoperative lumbar spine evaluation dated September 20, 2019 FINDINGS: There is no evidence of an acute lumbar spine fracture. Bilateral radiopaque pedicle screws are seen at the level of L5 and S1. No surrounding areas of lucency are seen to suggest the presence of hardware loosening or infection. It should be noted that this is limited in evaluation on a single AP lumbar spine plain film. Radiopaque operative material is seen within the L5-S1 intervertebral disc space. No scoliosis is seen. IMPRESSION: 1. Posterior decompression and fusion procedure at the level of L5-S1, without plain film evidence to suggest the presence of hardware failure. Electronically Signed   By: Virgina Norfolk M.D.   On: 09/26/2020 20:33   DG C-Arm 1-60 Min  Result Date: 09/27/2020 CLINICAL DATA:  Back surgery EXAM: LUMBAR SPINE - 2-3 VIEW; DG C-ARM 1-60 MIN COMPARISON:  09/26/2020, CT 09/25/2020 FINDINGS: Three low resolution intraoperative spot views of the lumbar spine. Total fluoroscopy time was 17.9 seconds. Images demonstrate posterior rods and fixating screws from L4 through the sacrum with screws projecting over the bilateral iliac bones. Interbody device at L5-S1. screw tip at L5 projects over the superior endplate. IMPRESSION: Intraoperative fluoroscopic assistance provided during lumbar spine surgery. Electronically Signed   By: Donavan Foil M.D.   On: 09/27/2020 22:07    Assessment/Plan: Will continue to let him rest this morning and pain control. Change to oral dilaudid  LOS: 2 days    Alexander Rojas Alexander Rojas 09/28/2020, 8:14 AM

## 2020-09-28 NOTE — Evaluation (Signed)
Physical Therapy Evaluation Patient Details Name: Alexander Rojas MRN: [id number] DOB: [birth year] Today's Date: 09/28/2020   History of Present Illness  pt is a 64 y/o male admitte with failure of the anterior and posterior instrumentation after a L5-S1 360 fusion with resultant back and R leg radicular pain.  Pt s/p exploration and removal of instrumentation at L5/S1, redo decompression at bil L% roots, intertransverse arthrodesis L4-S1 with bone grafting and segmental fixation L4 to ileum.  PMX:  HTN  Clinical Impression  Pt admitted with/for redo lumbar fusion for failed hardware and extreme pain.  Pt still in severe pain and unable to tolerate more than transition to EOB and sit to stand before pain levels were prohibitive to further mobility.  Pt currently limited functionally due to the problems listed. ( See problems list.)   Pt will benefit from PT to maximize function and safety in order to get ready for next venue listed below.     Follow Up Recommendations CIR;Other (comment) (Hopefully will go home with minimal f/u if improves.)    Equipment Recommendations  Rolling walker with 5" wheels;3in1 (PT);Other (comment) (? shower seat)    Recommendations for Other Services       Precautions / Restrictions Precautions Precautions: Back Required Braces or Orthoses: Spinal Brace Spinal Brace: Applied in sitting position;Lumbar corset      Mobility  Bed Mobility Overal bed mobility: Needs Assistance Bed Mobility: Rolling;Sidelying to Sit;Sit to Sidelying Rolling: Mod assist Sidelying to sit: Max assist;Total assist;+2 for physical assistance     Sit to sidelying: Total assist;+2 for physical assistance General bed mobility comments: cued for sequecing    Transfers Overall transfer level: Needs assistance   Transfers: Sit to/from Stand Sit to Stand: Mod assist;+2 physical assistance;Max assist         General transfer comment: cued for hand placement and assisted with  boost.  Ambulation/Gait             General Gait Details: pt unable  Stairs            Wheelchair Mobility    Modified Rankin (Stroke Patients Only)       Balance                                             Pertinent Vitals/Pain Pain Assessment: 0-10 Pain Score: 10-Worst pain ever Pain Location: R buttock and posterior R thigh Pain Descriptors / Indicators: Radiating;Moaning;Jabbing;Guarding Pain Intervention(s): Limited activity within patient's tolerance;Premedicated before session    Home Living Family/patient expects to be discharged to:: Private residence Living Arrangements: Spouse/significant other Available Help at Discharge: Family Type of Home: House Home Access: Level entry     Home Layout: Multi-level;Bed/bath upstairs Home Equipment: None      Prior Function Level of Independence: Independent               Hand Dominance        Extremity/Trunk Assessment   Upper Extremity Assessment Upper Extremity Assessment: Overall WFL for tasks assessed    Lower Extremity Assessment Lower Extremity Assessment: RLE deficits/detail RLE Deficits / Details: weakness due to pain RLE: Unable to fully assess due to pain    Cervical / Trunk Assessment Cervical / Trunk Assessment: Other exceptions Cervical / Trunk Exceptions: surgical back  Communication   Communication: No difficulties  Cognition Arousal/Alertness: Awake/alert Behavior During Therapy: [facility] for  tasks assessed/performed Overall Cognitive Status: Within Functional Limits for tasks assessed                                        General Comments General comments (skin integrity, edema, etc.): pt and wife instructed in back care/prec, log roll/transitions, donning the brace, lifting restrictions and progression of activity.    Exercises     Assessment/Plan    PT Assessment Patient needs continued PT services  PT Problem List Decreased  strength;Decreased activity tolerance;Decreased knowledge of use of DME;Decreased knowledge of precautions;Pain       PT Treatment Interventions DME instruction;Gait training;Stair training;Functional mobility training;Therapeutic activities;Patient/family education    PT Goals (Current goals can be found in the Care Plan section)  Acute Rehab PT Goals Patient Stated Goal: Weve got to get this pain under control PT Goal Formulation: With patient Time For Goal Achievement: 10/12/20 Potential to Achieve Goals: Good    Frequency Min 5X/week   Barriers to discharge        Co-evaluation               AM-PAC PT "6 Clicks" Mobility  Outcome Measure Help needed turning from your back to your side while in a flat bed without using bedrails?: A Lot Help needed moving from lying on your back to sitting on the side of a flat bed without using bedrails?: Total Help needed moving to and from a bed to a chair (including a wheelchair)?: Total Help needed standing up from a chair using your arms (e.g., wheelchair or bedside chair)?: Total Help needed to walk in hospital room?: Total Help needed climbing 3-5 steps with a railing? : Total 6 Click Score: 7    End of Session   Activity Tolerance: Patient limited by pain Patient left: in bed;with call bell/phone within reach;with family/visitor present Nurse Communication: Mobility status PT Visit Diagnosis: Other abnormalities of gait and mobility (R26.89);Pain Pain - Right/Left: Right Pain - part of body:  (back, buttock and back of R leg)    Time: 1510-1550 PT Time Calculation (min) (ACUTE ONLY): 40 min   Charges:   PT Evaluation $PT Eval Moderate Complexity: 1 Mod PT Treatments $Therapeutic Activity: 8-22 mins $Self Care/Home Management: 8-22        09/28/2020  Ginger Carne., PT Acute Rehabilitation Services [phone]  (pager) [phone]  (office)  Tessie Fass Jonmichael Beadnell 09/28/2020, 4:49 PM

## 2020-09-28 NOTE — Anesthesia Postprocedure Evaluation (Signed)
Anesthesia Post Note  Patient: Alexander Rojas  Procedure(s) Performed: Lumbar exploration with Extension of Posterior Instrumentation Lumbar Four to the [place] with Airo (N/A Spine Lumbar) APPLICATION OF INTRAOPERATIVE CT SCAN (N/A )     Patient location during evaluation: PACU Anesthesia Type: General Level of consciousness: awake and alert Pain management: pain level controlled Vital Signs Assessment: post-procedure vital signs reviewed and stable Respiratory status: spontaneous breathing, nonlabored ventilation, respiratory function stable and patient connected to nasal cannula oxygen Cardiovascular status: blood pressure returned to baseline and stable Postop Assessment: no apparent nausea or vomiting Anesthetic complications: no   No complications documented.  Last Vitals:  Vitals:   09/28/20 0229 09/28/20 0318  BP: 98/64 94/64  Pulse: (!) 59 65  Resp: 10 10  Temp: 37.2 C 36.7 C  SpO2: 97% 96%    Last Pain:  Vitals:   09/28/20 0425  TempSrc:   PainSc: Asleep                 March Rummage Treyten Monestime

## 2020-09-28 NOTE — Progress Notes (Signed)
Pt arrived to 4NP-7 via bed from PACU. Wife at bedside. Patient A&Ox4. VS stable. IV infusing. Hemovac charged. Foley intact. Pt on 3L O2.   Pt complaining of 10/10 pain, pt stated 1 mg of Dilaudid is not effective. On call provider notified. 1-2 mg dilaudid q1h PRN and 2.5 mg Valium q4h PRN ordered at pts request. This RN administered 2 mg of Dilaudid. Pt stated that this was effective but did not last long enough. Pt requested Valium dose and frequency be changed to 1-2 mg q1h PRN. On call provider notified of request and this RN noted that the pts BP has been in the 90's with MAP >65. 1.2 mg of Valium ordered q1h.

## 2020-09-28 NOTE — Progress Notes (Signed)
Orthopedic Tech Progress Note Patient Details:  DANDRE SISLER 1955-10-23 [id number] Ordered LSO Brace Patient ID: TRENT GABLER, male   DOB: [birth year], 64 y.o.   MRN: [id number]   Tammy Sours 09/28/2020, 6:37 AM

## 2020-09-28 NOTE — Progress Notes (Signed)
Pt refused foley removal. Pt educated.

## 2020-09-29 MED ORDER — MAGNESIUM CITRATE PO SOLN
1.0000 | Freq: Once | ORAL | Status: AC
  Administered 2020-09-30: 1 via ORAL
  Filled 2020-09-29: qty 296

## 2020-09-29 MED ORDER — DIPHENHYDRAMINE HCL 50 MG/ML IJ SOLN: 12.5000 mg | Freq: Four times a day (QID) | INTRAMUSCULAR | Status: DC | PRN

## 2020-09-29 MED ORDER — ENOXAPARIN SODIUM 30 MG/0.3ML ~~LOC~~ SOLN
30.0000 mg | SUBCUTANEOUS | Status: DC
  Administered 2020-09-29 – 2020-10-10 (×12): 30 mg via SUBCUTANEOUS
  Filled 2020-09-29 (×12): qty 0.3

## 2020-09-29 MED ORDER — VITAMIN A 3 MG (10000 UNIT) PO CAPS
10000.0000 [IU] | ORAL_CAPSULE | Freq: Every day | ORAL | Status: DC
  Administered 2020-09-29 – 2020-10-10 (×11): 10000 [IU] via ORAL
  Filled 2020-09-29 (×12): qty 1

## 2020-09-29 MED ORDER — DIPHENHYDRAMINE HCL 12.5 MG/5ML PO ELIX
12.5000 mg | ORAL_SOLUTION | Freq: Four times a day (QID) | ORAL | Status: DC | PRN
  Filled 2020-09-29: qty 5

## 2020-09-29 MED ORDER — DOCUSATE SODIUM 100 MG PO CAPS
100.0000 mg | ORAL_CAPSULE | Freq: Two times a day (BID) | ORAL | Status: DC
  Administered 2020-10-02 – 2020-10-08 (×13): 100 mg via ORAL
  Filled 2020-09-29 (×19): qty 1

## 2020-09-29 MED ORDER — GABAPENTIN 400 MG PO CAPS
400.0000 mg | ORAL_CAPSULE | Freq: Three times a day (TID) | ORAL | Status: DC
  Administered 2020-09-29 – 2020-10-10 (×33): 400 mg via ORAL
  Filled 2020-09-29 (×32): qty 1

## 2020-09-29 MED ORDER — TAPENTADOL HCL 50 MG PO TABS
100.0000 mg | ORAL_TABLET | Freq: Four times a day (QID) | ORAL | Status: DC
  Administered 2020-09-29 – 2020-10-10 (×41): 100 mg via ORAL
  Filled 2020-09-29 (×44): qty 2

## 2020-09-29 MED ORDER — ONDANSETRON HCL 4 MG/2ML IJ SOLN: 4.0000 mg | Freq: Four times a day (QID) | INTRAMUSCULAR | Status: DC | PRN

## 2020-09-29 MED ORDER — HYDROMORPHONE 1 MG/ML IV SOLN
INTRAVENOUS | Status: DC
Start: 2020-09-29 — End: 2020-10-03
  Administered 2020-10-02: 30 mg via INTRAVENOUS
  Filled 2020-09-29: qty 30

## 2020-09-29 MED ORDER — NALOXONE HCL 0.4 MG/ML IJ SOLN: 0.4000 mg | INTRAMUSCULAR | Status: DC | PRN

## 2020-09-29 MED ORDER — TAPENTADOL HCL ER 100 MG PO TB12
200.0000 mg | ORAL_TABLET | Freq: Two times a day (BID) | ORAL | Status: DC
Start: 2020-09-29 — End: 2020-09-29

## 2020-09-29 MED ORDER — SODIUM CHLORIDE 0.9% FLUSH: 9.0000 mL | INTRAVENOUS | Status: DC | PRN

## 2020-09-29 MED ORDER — HYDROMORPHONE 1 MG/ML IV SOLN
INTRAVENOUS | Status: DC
  Administered 2020-09-29: 30 mg via INTRAVENOUS
  Filled 2020-09-29: qty 30

## 2020-09-29 MED ORDER — DIPHENHYDRAMINE HCL 12.5 MG/5ML PO ELIX: 12.5000 mg | ORAL_SOLUTION | Freq: Four times a day (QID) | ORAL | Status: DC | PRN

## 2020-09-29 MED ORDER — NALOXEGOL OXALATE 12.5 MG PO TABS
12.5000 mg | ORAL_TABLET | Freq: Every day | ORAL | Status: DC
Start: 2020-09-29 — End: 2020-10-10
  Administered 2020-09-30 – 2020-10-08 (×8): 12.5 mg via ORAL
  Filled 2020-09-29 (×12): qty 1

## 2020-09-29 MED ORDER — TAPENTADOL HCL 50 MG PO TABS
200.0000 mg | ORAL_TABLET | Freq: Two times a day (BID) | ORAL | Status: DC
Start: 2020-09-29 — End: 2020-09-29

## 2020-09-29 MED ORDER — GABAPENTIN 300 MG PO CAPS
300.0000 mg | ORAL_CAPSULE | Freq: Three times a day (TID) | ORAL | Status: DC
  Administered 2020-09-29: 300 mg via ORAL
  Filled 2020-09-29 (×2): qty 1

## 2020-09-29 MED ORDER — ASCORBIC ACID 500 MG PO TABS
500.0000 mg | ORAL_TABLET | Freq: Two times a day (BID) | ORAL | Status: DC
  Administered 2020-09-29 – 2020-10-10 (×22): 500 mg via ORAL
  Filled 2020-09-29 (×23): qty 1

## 2020-09-29 MED ORDER — HYDROMORPHONE HCL 1 MG/ML IJ SOLN
1.0000 mg | Freq: Once | INTRAMUSCULAR | Status: AC
  Administered 2020-09-29: 1 mg via INTRAVENOUS
  Filled 2020-09-29: qty 1

## 2020-09-29 NOTE — Consult Note (Signed)
Patient seen at request of Dr. Ronnald Ramp for pain management.  I have reviewed Dr. Ronnald Ramp notes.  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  He has developed a right leg neuropathic pain the occurs with any movement of his leg or foot.  The pain is limiting his ability to participate in PT or get out of the bed.  Currently, for pain, he is receiving iv dexamethasone, celebrex, gabapentin (300tid), valium 1-2mg  q1hr prn, and a hydromorphone PCA (0.3mg  bolus q 8 minutes, 1.25 hr max).  Prior to this surgery, he states he was taking Nucynta 100mg  PO q4hrs.  CT potentially planned for 10/01/20.  Additionnally, the patient was inquiring about at ketamine infusion.  Exam:  Vital signs reviewed in Epic, appropriate Alert, appropriately conversive Regular breathing pattern Pain with any flexion of the right ankle  A/P: 64yo POD#2 L5-S1 hardware removal, instrumentation from L4-S1, RLE pain limiting progress, participation in PT.  1.  Patient's chief pain is incidental.  He now has a PCA; I think it is reasonable to slightly increase this to 0.5 mg every 15 minutes up to 2mg  in an hour.    2.  A thorough discussion was held with preemptively managing this pain that occurs with movement by using the PCA prior to attempted exercises/activities involving his right leg.   3.  To further help with the neuropathic pain components, I will increase his gabapentin to 400mg  PO TID - can increase to 600mg  PO TID if needed in a few days.  4.  He was on Nucynta prior to this surgery, I think it is reasonable to have this on board for some base line level of analgesia, aside from the PCA.  It can help with both the nociceptive and neuropathic components of his pain.  I will start Nucynta ER 200mg  PO Q12hrs.  5.  With regards to the ketamine infusion, given his pain is tolerable without movement currently, and other options have not been exhausted, I think it is  reasonable to defer option this for the time being; additionally, it appears he would need to be transferred to an ICU bed to implement the infusion.  This was discussed with Dr. Genevive Bi, and he is in agreement with our current plan.  6.  Will follow.  Please contact me for any questions.

## 2020-09-29 NOTE — Consult Note (Signed)
   [facility] Inpatient Consult   09/29/2020  TIMOFEY CARANDANG 1956/10/07 [id number]    [place] [facility] [ACO] Patient:  [place] plan  Patient is currently assigned to a [place] Management RN for post hospital support for transition of care needs. PT note reviewed for progress   Plan: Following for readmission, ongoing needs, progress, and care needs,  Patient will be followed by [place] Coordinator.   For additional questions or referrals please contact:   Natividad Brood, RN BSN [facility] Liaison  [phone] business mobile phone Toll free office [phone]  Fax number: [phone] [email or web address]

## 2020-09-29 NOTE — Progress Notes (Signed)
OT Cancellation Note  Patient Details Name: Alexander Rojas MRN: [id number] DOB: [birth year]   Cancelled Treatment:    Reason Eval/Treat Not Completed: Pain limiting ability to participate.  Will check back Sunday or Monday for appropriateness.  Nilsa Nutting., OTR/L Acute Rehabilitation Services Pager [phone] Office [phone]   Lucille Passy M 09/29/2020, 12:02 PM

## 2020-09-29 NOTE — Progress Notes (Signed)
He continues to have very severe right buttocks and posterior thigh pain with any movement of the right lower extremity.  Even wiggling the toes will send him off into paroxysms of pain.  The pain is typically controllable about 95% of the time if he simply lies still.  It is unclear to me why he has this pain.  Is it the sacral fracture?  Is it the failed anterior construct?  Is it still significant radiculitis?  Certainly doubt hematoma or misplaced hardware. do I need to also revise the anterior lumbar interbody fusion at L5-S1?  So difficult answer these questions.  Obviously, I have had multiple conversations with multiple partners of mine, and right now we all feel observation and time is the best option.  We all tend to feel that with the posterior instrumentation in place that the anterior instrumentation is now likely fairly or reasonably stable even though there is not significant bone to bone contact now.  This would risk pseudoarthrosis in the front in the future but he now has a posterior fusion.  Ideally, I would love to revise the anterior construct and place a larger more lordotic cage to get better bone to bone contact and potentially add stability, but I am quite concerned about my ability to instrument this with either an anterior plate or integrated screws or anchors because of the bone quality and fracture of the sacrum and likely loss of pullout strength at L5 also.  Unfortunately it is difficult to know the answers to these questions without imaging, and he cannot tolerate imaging because he simply cannot slide from his bed onto the imaging table.  We could consider doing this under anesthesia.  He is on significant amounts of medications, but I am trying to honor his request as best I can and he seems to be tolerating the medications without significant side effects at this time.  For now we feel that strict bedrest and pain control and given this another 24 to 48 hours and then  attempting a CT scan is a reasonable plan.  I simply do not want to wait too long to make a decision about anterior revision because the longer we wait the more difficult the revision will be from an exposure standpoint.  We will start DVT prophylaxis today.  Continue pain control.  I am considering getting my pain management physician involved to try to help control the pain as best we can.  The patient is requesting a ketamine infusion and I will ask anesthesia about this as I simply do not know much about it as I have never used it.

## 2020-09-29 NOTE — Progress Notes (Signed)
10:05 2mg  IV valium given. IV valium received from secure tube via pharmacy. 8mg  valium wasted with CN Amber.

## 2020-09-29 NOTE — Progress Notes (Signed)
Rehab Admissions Coordinator Note:  Patient was screened by Cleatrice Burke for appropriateness for an Inpatient Acute Rehab Consult per PT recs., but pain limiting progress.   At this time, we are recommending await therapy progress once pain better tolerate. We will follow.  Cleatrice Burke RN MSN 09/29/2020, 9:09 AM  I can be reached at [phone].

## 2020-09-29 NOTE — TOC Initial Note (Signed)
Transition of Care (TOC) - Initial/Assessment Note  Marvetta Gibbons RN,BSN Transitions of Care Unit 4NP (non trauma) - RN Case Manager See Treatment Team for direct Phone #   Patient Details  Name: Alexander Rojas MRN: [id number] Date of Birth: Aug 19, 1956  Transition of Care [facility]) CM/SW Contact:    Dawayne Patricia, RN Phone Number: 09/29/2020, 12:55 PM  Clinical Narrative:                 Pt requested to speak with [place] in to speak with pt and wife at bedside to discuss rehab option and possible transition needs on discharge. Discussed varies transition options pending patient progress once he started working with therapies and pain control. These included INPT rehab, STSNF rehab, home with [facility], private duty assistance.  All questions answered at this time, contact info provided and TOC will continue to follow for most appropriate transition plan.  Pt voiced appreciation.   Expected Discharge Plan: IP Rehab Facility Barriers to Discharge: Continued Medical Work up   Patient Goals and CMS Choice    NA at this time    Expected Discharge Plan and Services Expected Discharge Plan: [place]       Living arrangements for the past 2 months: [place]                                      Prior Living Arrangements/Services Living arrangements for the past 2 months: Single Family Home Lives with:: Spouse Patient language and need for interpreter reviewed:: Yes        Need for Family Participation in Patient Care: Yes (Comment) Care giver support system in place?: Yes (comment)   Criminal Activity/Legal Involvement Pertinent to Current Situation/Hospitalization: No - Comment as needed  Activities of Daily Living      Permission Sought/Granted                  Emotional Assessment Appearance:: Appears older than stated age Attitude/Demeanor/Rapport: Engaged Affect (typically observed): Appropriate Orientation: : Oriented to  Self,Oriented to Place,Oriented to  Time,Oriented to Situation   Psych Involvement: No (comment)  Admission diagnosis:  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 S/P lumbar fusion 09/19/2020  . Benign prostatic hyperplasia with urinary hesitancy 05/09/2017  . Tongue burning sensation 04/18/2016  . Essential hypertension 10/06/2014  . Hypothyroidism due to acquired atrophy of thyroid 10/06/2014   PCP:  Adin Hector, MD Pharmacy:   [facility] [address] [place] [address] Phone: [phone] Fax: [phone]     Social Determinants of Health (SDOH) Interventions    Readmission Risk Interventions No flowsheet data found.

## 2020-09-29 NOTE — Progress Notes (Signed)
PT Cancellation Note  Patient Details Name: Alexander Rojas MRN: [id number] DOB: 09/27/1956   Cancelled Treatment:    Reason Eval/Treat Not Completed: Patient declined, no reason specified;Medical issues which prohibited therapy .  Pt still too painful to move today.  CT scheduled for Sunday.  Will check on pt until then. 09/29/2020  Ginger Carne., PT Acute Rehabilitation Services [phone]  (pager) [phone]  (office)  Tessie Fass Jaz [place] 09/29/2020, 12:30 PM

## 2020-09-30 LAB — CBC
HCT: 33.6 % — ABNORMAL LOW (ref 39.0–52.0)
Hemoglobin: 11.9 g/dL — ABNORMAL LOW (ref 13.0–17.0)
MCH: 34.9 pg — ABNORMAL HIGH (ref 26.0–34.0)
MCHC: 35.4 g/dL (ref 30.0–36.0)
MCV: 98.5 fL (ref 80.0–100.0)
Platelets: 267 10*3/uL (ref 150–400)
RBC: 3.41 MIL/uL — ABNORMAL LOW (ref 4.22–5.81)
RDW: 11.9 % (ref 11.5–15.5)
WBC: 12.1 10*3/uL — ABNORMAL HIGH (ref 4.0–10.5)
nRBC: 0 % (ref 0.0–0.2)

## 2020-09-30 LAB — BASIC METABOLIC PANEL WITH GFR
Anion gap: 8 (ref 5–15)
BUN: 20 mg/dL (ref 8–23)
CO2: 24 mmol/L (ref 22–32)
Calcium: 8.1 mg/dL — ABNORMAL LOW (ref 8.9–10.3)
Chloride: 106 mmol/L (ref 98–111)
Creatinine, Ser: 0.71 mg/dL (ref 0.61–1.24)
GFR, Estimated: >60 mL/min
Glucose, Bld: 125 mg/dL — ABNORMAL HIGH (ref 70–99)
Potassium: 4.2 mmol/L (ref 3.5–5.1)
Sodium: 138 mmol/L (ref 135–145)

## 2020-09-30 NOTE — Evaluation (Signed)
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  Pt s/p exploration and removal of instrumentation at L5/S1, redo decompression at bil L% roots, intertransverse arthrodesis L4-S1 with bone grafting and segmental fixation L4 to ileum.  PMX:  HTN   Clinical Impression   Pt admitted with above. He demonstrates the below listed deficits and will benefit from continued OT to maximize safety and independence with BADLs.  OT eval limited to bed level this date.  Pt and wife instructed in back precautions and how to safely perform bed mobility including log rolling, shifting weight, and scooting toward the [facility].   Pt able to perform with min A progressing to min guard assist, with pain levels 2-6/10 with 6/10 occurring with full side lying and returning to supine.  Pain immediately decreased to 3/10, and was 1-2/10 at end of session.   Wife instructed how to assist pt with bed mobility if needed.  Discussed options for diversional activities that may help with pain control and boredom such as use of music, podcasts, etc.  Pt very pleasant very motivated.  Anticipate good progress if pain levels remain controlled.        Follow Up Recommendations  Home health OT;Supervision/Assistance - 24 hour (depending on progress)    Equipment Recommendations  3 in 1 bedside commode;Tub/shower seat    Recommendations for Other Services       Precautions / Restrictions Precautions Precautions: Back Precaution Comments: reviewed back precautions with pt Required Braces or Orthoses: Spinal Brace Spinal Brace: Applied in sitting position;Lumbar corset Restrictions Weight Bearing Restrictions: No      Mobility Bed Mobility Overal bed mobility:  Needs Assistance Bed Mobility: Rolling Rolling: Min assist;Min guard         General bed mobility comments: Pt instructed in log roll technique.  He was able to roll to Rt with min A, and to Lt with min guard assist - heavy use of bed rail.  Pt with increased pain on second roll when returning to supine    Transfers                 General transfer comment: deferred this date    Balance                                           ADL either performed or assessed with clinical judgement   ADL Overall ADL's : Needs assistance/impaired Eating/Feeding: Set up;Bed level   Grooming: Wash/dry hands;Wash/dry face;Oral care;Brushing hair;Set up;Bed level   Upper Body Bathing: Minimal assistance;Bed level   Lower Body Bathing: Maximal assistance;Bed level   Upper Body Dressing : Maximal assistance;Bed level   Lower Body Dressing: Total assistance;Bed level   Toilet Transfer: Total assistance Toilet Transfer Details (indicate cue type and reason): unable to safely attempt today Toileting- Clothing Manipulation and Hygiene: Total assistance;Bed level       Functional mobility during ADLs: Minimal assistance;Min guard (bed level)       Vision         Perception     Praxis      Pertinent Vitals/Pain Pain Assessment: 0-10 Pain Score: 2  Pain Location: R buttock and posterior R thigh Pain Descriptors / Indicators: Grimacing;Guarding;Spasm;Radiating Pain Intervention(s): Repositioned;PCA encouraged;Limited activity within patient's tolerance;Monitored during session     Hand Dominance Right   Extremity/Trunk Assessment Upper Extremity Assessment Upper Extremity Assessment: Generalized weakness   Lower Extremity Assessment Lower Extremity Assessment: Defer to PT evaluation   Cervical / Trunk Assessment Cervical / Trunk Assessment: Other exceptions Cervical / Trunk Exceptions: surgical back   Communication Communication Communication: No  difficulties   Cognition Arousal/Alertness: Awake/alert Behavior During Therapy: WFL for tasks assessed/performed Overall Cognitive Status: Within Functional Limits for tasks assessed                                     General Comments  wife present throughout session    Exercises Exercises: Other exercises Other Exercises Other Exercises: worked on [place] and Rt as well as repositioning safely in bed. Other Exercises: with bed in trendelenburg position, pt able to scoot himself toward head of bed with min guard assist and min cues.  Wife instructed how to assist pt Other Exercises: reviewed bed positioning and pillows under knees when supine and pillows between knees when sidelying.  Also instructed pt how to use remote to adjust bed height.  Instructed he and wife, he is okay to raise HOB to ~45* for up to 15 mins, but then reduce head angle.  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 [place]        OT Problem List: Decreased activity tolerance;Impaired balance (sitting and/or standing);Decreased safety awareness;Decreased knowledge of use of DME or AE;Pain;Decreased knowledge of precautions      OT Treatment/Interventions: Self-care/ADL training;DME and/or AE instruction;Therapeutic activities;Balance training;Patient/family education;Therapeutic exercise    OT  Goals(Current goals can be found in the care plan section) Acute Rehab OT Goals Patient Stated Goal: to return to work OT Goal Formulation: With patient/family Time For Goal Achievement: 10/14/20 Potential to Achieve Goals: Good ADL Goals Pt Will Perform Grooming: with min guard assist;standing Pt Will Perform Upper Body Bathing: with set-up;with supervision;sitting Pt Will Perform Lower Body Bathing: with min guard assist;with adaptive equipment;sit to/from stand Pt Will Perform Upper Body Dressing: with set-up;with supervision;sitting Pt Will Perform Lower Body Dressing: with min guard assist;sit to/from stand;with adaptive equipment Pt Will Transfer to Toilet: with min guard assist;ambulating;regular height toilet;bedside commode;grab bars Pt Will Perform Toileting - Clothing Manipulation and hygiene: with min guard assist;sit to/from stand;with adaptive equipment Pt Will Perform Tub/Shower Transfer: Shower transfer;ambulating;shower seat;rolling walker;with min guard assist Pt/caregiver will Perform Home Exercise Program: Increased strength;Right Upper extremity;Left upper extremity;With theraband;With written HEP provided;Independently  OT Frequency: Min 3X/week   Barriers to D/C:            Co-evaluation              AM-PAC OT "6 Clicks" Daily Activity  Outcome Measure Help from another person eating meals?: A Little Help from another person taking care of personal grooming?: A Little Help from another person toileting, which includes using toliet, bedpan, or urinal?: Total Help from another person bathing (including washing, rinsing, drying)?: A Lot Help from another person to put on and taking off regular upper body clothing?: A Lot Help from another person to put on and taking off regular lower body clothing?: Total 6 Click Score: 12   End of Session Nurse Communication: Mobility status  Activity Tolerance: Patient tolerated treatment well Patient left: in  bed;with call bell/phone within reach;with family/visitor present  OT Visit Diagnosis: Pain Pain - part of body:  (back)                Time: 1155-1301 OT Time Calculation (min): 66 min Charges:  OT General Charges $OT Visit: 1 Visit OT Evaluation $OT Eval Moderate Complexity: 1 Mod OT Treatments $Therapeutic Activity: 53-67 mins  Nilsa Nutting., OTR/L Acute Rehabilitation Services Pager [phone] Office [phone]   Lucille Passy M 09/30/2020, 3:12 PM

## 2020-09-30 NOTE — Plan of Care (Signed)

## 2020-09-30 NOTE — Progress Notes (Signed)
He is doing much better.  He is sitting up in bed.  He is moving his legs without pain today.  Looks much more comfortable.  Still working on bowel movement.  Checking electrolytes today.  Really pleased with his progress.  Continue Lovenox for DVT prophylaxis.  Continue PCA and pain management.  Has not used his PCA overnight had a very restful night.

## 2020-10-01 MED ORDER — DEXAMETHASONE 4 MG PO TABS
4.0000 mg | ORAL_TABLET | Freq: Four times a day (QID) | ORAL | Status: DC
  Administered 2020-10-01 – 2020-10-04 (×11): 4 mg via ORAL
  Filled 2020-10-01 (×12): qty 1

## 2020-10-01 MED ORDER — DEXAMETHASONE SODIUM PHOSPHATE 4 MG/ML IJ SOLN
2.0000 mg | Freq: Four times a day (QID) | INTRAMUSCULAR | Status: DC
  Administered 2020-10-03: 2 mg via INTRAVENOUS
  Filled 2020-10-01: qty 1

## 2020-10-01 NOTE — Progress Notes (Signed)
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  Pt s/p exploration and removal of instrumentation at L5/S1, redo decompression at bil L% roots, intertransverse arthrodesis L4-S1 with bone grafting and segmental fixation L4 to ileum.  PMX:  HTN    PT Comments    Patient progressing very slowly towards PT goals. Session performed in conjunction with OT to maximize mobility, ease anxiety and for overall safety. Pt continues to be limited by pain in bil hips, back and buttocks worsened in different positions. Pt utilizing PCA pump as well as premedicated with valium prior to session. Worked on rolling, bed mobility and there ex of LEs. Pt highly anxious about mobility and pain and upset/tearful during session regarding lack of progress made. Emphasized importance of LE exercises and frequent changes in position limiting upright to >25 degrees no longer than 15 minutes at a time. Lengthy discussion problem solving mobility, positioning, techniques, bed controls, pillow positioning, pain control etc. Recommend Kreg tilt bed to work on upright, BLE weight bearing and ultimately minimizing pain with mobility with the goal of progressing to OOB and walking when able as pt with lots of difficulty with bed mobility due to pain and unable to get to EOB utilizing log roll technique. MD notified of recommendation for bed. Will continue to follow and progress as able.   Follow Up Recommendations  CIR;Supervision for mobility/OOB     Equipment Recommendations  Rolling walker with 5" wheels;3in1 (PT);Other (comment)    Recommendations for Other Services       Precautions / Restrictions Precautions Precautions: Back Required Braces or Orthoses: Spinal  Brace Spinal Brace: Applied in sitting position;Lumbar corset Restrictions Weight Bearing Restrictions: No    Mobility  Bed Mobility Overal bed mobility: Needs Assistance Bed Mobility: Rolling Rolling: Min assist;Min guard         General bed mobility comments: rolling to left/right x4 initially requiring Mod A progressing to Min guard assist; once in sidelying worked on hip flexion (bringing knees towards chest and then feet forwards towards EOB). Heavy use of rail and cues for breathing. Limited by pain. Pillow between legs for comfort/pain control.  Transfers                 General transfer comment: deferred this date  Ambulation/Gait             General Gait Details: pt unable   Stairs             Wheelchair Mobility    Modified Rankin (Stroke Patients Only)       Balance                                            Cognition Arousal/Alertness: Awake/alert Behavior During Therapy: Anxious Overall Cognitive Status: Within Functional Limits for tasks assessed                                 General Comments: Pt highly anxious about mobility. Likes to know the plan step by step. Discouraged and tearful during session due to lack of progress.  Exercises General Exercises - Lower Extremity Ankle Circles/Pumps: AROM;Both;10 reps;Supine Quad Sets: AROM;Both;10 reps;Supine Heel Slides: AROM;Both;5 reps;Supine Other Exercises Other Exercises: Worked on straightening LEs against shortened foot rest with bed in reverse trendelenberg to allow for offloading bottom/back, WB and LE exercises pushing against end of bed. Instructed wife on how to manuever foot plate.    General Comments General comments (skin integrity, edema, etc.): Wife present throughout session. Lengthy discussion problem solving mobility, positioning, techniques, bed controls, pillow positioning etc. Recommend Kreg tilt bed to work on upright, BLE  weight bearin and ultimately minimizing pain with mobility with the goal of progressing to walking off bed as pt with lots of difficulty with bed mobility due to pain and unable to get to EOB.      Pertinent Vitals/Pain Pain Assessment: Faces Faces Pain Scale: Hurts whole lot Pain Location: bil hips, sacrum ("deep in the bone") Pain Descriptors / Indicators: Aching;Grimacing;Guarding;Stabbing Pain Intervention(s): Monitored during session;Premedicated before session;Limited activity within patient's tolerance;Repositioned;PCA encouraged    Home Living                      Prior Function            PT Goals (current goals can now be found in the care plan section) Progress towards PT goals: Progressing toward goals (slowly)    Frequency    Min 5X/week      PT Plan Current plan remains appropriate    Co-evaluation PT/OT/SLP Co-Evaluation/Treatment: Yes Reason for Co-Treatment: To address functional/ADL transfers;For patient/therapist safety;Necessary to address cognition/behavior during functional activity PT goals addressed during session: Mobility/safety with mobility;Strengthening/ROM        AM-PAC PT "6 Clicks" Mobility   Outcome Measure  Help needed turning from your back to your side while in a flat bed without using bedrails?: A Little Help needed moving from lying on your back to sitting on the side of a flat bed without using bedrails?: Total Help needed moving to and from a bed to a chair (including a wheelchair)?: Total Help needed standing up from a chair using your arms (e.g., wheelchair or bedside chair)?: Total Help needed to walk in hospital room?: Total Help needed climbing 3-5 steps with a railing? : Total 6 Click Score: 8    End of Session   Activity Tolerance: Patient limited by pain Patient left: in bed;with call bell/phone within reach;with family/visitor present;with bed alarm set Nurse Communication: Mobility status;Other (comment)  (Kreg bed) PT Visit Diagnosis: Other abnormalities of gait and mobility (R26.89);Pain Pain - Right/Left:  (bil) Pain - part of body:  (hips)     Time: 6270-3500 PT Time Calculation (min) (ACUTE ONLY): 75 min  Charges:  $Therapeutic Activity: 8-22 mins $Self Care/Home Management: 8-22                     Zettie Cooley, DPT Acute Rehabilitation Services Pager [phone] Office [phone]       Marguarite Arbour A Sabra Heck 10/01/2020, 3:56 PM

## 2020-10-01 NOTE — Progress Notes (Signed)
Doing better, some posterior thigh pain but no back pain, turning in bed, good leg strength, pleased with progrss

## 2020-10-01 NOTE — Progress Notes (Signed)
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  Pt s/p exploration and removal of instrumentation at L5/S1, redo decompression at bil L% roots, intertransverse arthrodesis L4-S1 with bone grafting and segmental fixation L4 to ileum.  PMX:  HTN   OT comments  Pt seen in conjunction with PT.  Pt able to roll Lt and Rt today initially with min A, but progressed to min guard assist.  Attempted to move to EOB sitting, however, pt with significant increased in Rt hip and thigh pain which limited ability to progress to sitting.  Pain at 8/10 at it's worst, and subsided to 4/10 within 5-7 mins Worked on progressing toward.  Pt able to tolerate bed in reverse trendelenburg to 30* while pushing feet against foot board, but did not tolerate chair position in the bed.  Problem solved through options of how to progress pt OOB - recommend Kreg verticalization bed to allow [place] to tilt him into standing position and then possible egress in standing.  Pt with increased anxiety due to fear and anticipation of pain.  Recommended head space app to him to work on breath control in hopes of decreasing anxiety when pain occurs.  Will continue attempts at [place] activities.   Follow Up Recommendations  Home health OT;Supervision/Assistance - 24 hour    Equipment Recommendations  3 in 1 bedside commode;Tub/shower seat    Recommendations for Other Services      Precautions / Restrictions Precautions Precautions: Back Precaution Comments: reviewed back precautions with pt Required Braces or Orthoses: Spinal Brace Spinal Brace: Applied in sitting position;Lumbar corset Restrictions Weight Bearing Restrictions: No       Mobility Bed Mobility Overal bed  mobility: Needs Assistance Bed Mobility: Rolling Rolling: Min assist;Min guard         General bed mobility comments: rolling to left/right x4 initially requiring Mod A progressing to Min guard assist; once in sidelying worked on hip flexion (bringing knees towards chest and then feet forwards towards EOB). Heavy use of rail and cues for breathing. Limited by pain. Pillow between legs for comfort/pain control.  Transfers                 General transfer comment: unable to progress to OOB    Balance                                           ADL either performed or assessed with clinical judgement   ADL Overall ADL's : Needs assistance/impaired                                             Vision       Perception     Praxis      Cognition Arousal/Alertness: Awake/alert Behavior During Therapy: Anxious Overall Cognitive Status: Within Functional Limits for tasks assessed                                 General Comments:  Pain induces anxiety.  pt tearful at times        Exercises General Exercises - Lower Extremity Ankle Circles/Pumps: AROM;Both;10 reps;Supine Quad Sets: AROM;Both;10 reps;Supine Heel Slides: AROM;Both;5 reps;Supine Other Exercises Other Exercises: Pt instructed to avoid sitting with HOB >25* in bed for longer than 42mins.  Discussed need to frequently change positions   Shoulder Instructions       General Comments Wife present throughout session. Lengthy discussion problem solving mobility, positioning, techniques, bed controls, pillow positioning etc. Recommend Kreg tilt bed to work on upright, BLE weight bearin and ultimately minimizing pain with mobility with the goal of progressing to walking off bed as pt with lots of difficulty with bed mobility due to pain and unable to get to EOB.    Pertinent Vitals/ Pain       Pain Assessment: Faces Faces Pain Scale: Hurts whole lot Pain Location:  bil hips, sacrum ("deep in the bone") Pain Descriptors / Indicators: Aching;Grimacing;Guarding;Stabbing Pain Intervention(s): Monitored during session;PCA encouraged;Limited activity within patient's tolerance;Repositioned;Premedicated before session  Home Living                                          Prior Functioning/Environment              Frequency  Min 3X/week        Progress Toward Goals  OT Goals(current goals can now be found in the care plan section)  Progress towards OT goals: Progressing toward goals     Plan Discharge plan remains appropriate    Co-evaluation    PT/OT/SLP Co-Evaluation/Treatment: Yes Reason for Co-Treatment: Necessary to address cognition/behavior during functional activity;For patient/therapist safety PT goals addressed during session: Mobility/safety with mobility;Strengthening/ROM OT goals addressed during session: ADL's and self-care;Strengthening/ROM      AM-PAC OT "6 Clicks" Daily Activity     Outcome Measure   Help from another person eating meals?: A Little Help from another person taking care of personal grooming?: A Little Help from another person toileting, which includes using toliet, bedpan, or urinal?: Total Help from another person bathing (including washing, rinsing, drying)?: A Lot Help from another person to put on and taking off regular upper body clothing?: A Lot Help from another person to put on and taking off regular lower body clothing?: Total 6 Click Score: 12    End of Session    OT Visit Diagnosis: Pain Pain - Right/Left: Right Pain - part of body: Hip;Leg (back)   Activity Tolerance Patient limited by pain   Patient Left in bed;with call bell/phone within reach;with nursing/sitter in room;with family/visitor present   Nurse Communication Mobility status        Time: 2876-8115 OT Time Calculation (min): 75 min  Charges: OT General Charges $OT Visit: 1 Visit OT  Treatments $Therapeutic Activity: 23-37 mins  Nilsa Nutting., OTR/L Acute Rehabilitation Services Pager [phone] Office [phone]    Lucille Passy M 10/01/2020, 6:19 PM

## 2020-10-02 NOTE — Progress Notes (Signed)
Subjective: Patient reports stood with kreg bed, leg doing ok, back nor sore at all, no NTW, flatus, no BM  Objective: Vital signs in last 24 hours: Temp:  [98 F (36.7 C)-98.3 F (36.8 C)] 98.1 F (36.7 C) (12/13 1134) Pulse Rate:  [50-77] 77 (12/13 1600) Resp:  [12-20] 14 (12/13 1600) BP: (98-119)/(67-80) 117/72 (12/13 1134) SpO2:  [85 %-97 %] 85 % (12/13 1600)  Intake/Output from previous day: 12/12 0701 - 12/13 0700 In: -  Out: 700 [Urine:700] Intake/Output this shift: Total I/O In: 480 [P.O.:480] Out: 425 [Urine:425]  Neurologic: Grossly normal with good strength to in bed exam  Lab Results: Lab Results  Component Value Date   WBC 12.1 (H) 09/30/2020   HGB 11.9 (L) 09/30/2020   HCT 33.6 (L) 09/30/2020   MCV 98.5 09/30/2020   PLT 267 09/30/2020   Lab Results  Component Value Date   INR 1.0 09/15/2020   BMET Lab Results  Component Value Date   NA 138 09/30/2020   K 4.2 09/30/2020   CL 106 09/30/2020   CO2 24 09/30/2020   GLUCOSE 125 (H) 09/30/2020   BUN 20 09/30/2020   CREATININE 0.71 09/30/2020   CALCIUM 8.1 (L) 09/30/2020    Studies/Results: No results found.  Assessment/Plan: Making progress, hopefully walk tomorrow, hopefully f/u films tomorrow, work on BM, cont lovenox  Estimated body mass index is 29.27 kg/m as calculated from the following:   Height as of 09/19/20: 5\' 10"  (1.778 m).   Weight as of 09/19/20: 92.5 kg.    LOS: 6 days    Eustace Moore 10/02/2020, 5:25 PM

## 2020-10-02 NOTE — Progress Notes (Signed)
Physical Therapy Treatment Patient Details Name: Alexander Rojas MRN: [id number] DOB: 06-12-1956 Today's Date: 10/02/2020    History of Present Illness pt is a 64 y/o male admitte with failure of the anterior and posterior instrumentation after a L5-S1 360 fusion with resultant back and R leg radicular pain.  Pt s/p exploration and removal of instrumentation at L5/S1, redo decompression at bil L% roots, intertransverse arthrodesis L4-S1 with bone grafting and segmental fixation L4 to ileum.  PMX:  HTN    PT Comments    Patient progressing well towards PT goals. Today, pt tolerated tilting in Kreg bed to 70 degrees and 60 degrees working on pre gait activities- weight shifting, mini marches, toe tapping, mini squats with heavy reliance on UEs. Able to pull trunk away from bed with some discomfort. Able to stand for ~35 minutes. Pt reports significant relief with standing and change in position. Session also assisted with building some confidence with regards to mobility and less pain. Plan to Egress pt off bed and initiate gait training next session as tolerated. Will follow.   Follow Up Recommendations  CIR;Supervision for mobility/OOB     Equipment Recommendations  Rolling walker with 5" wheels;3in1 (PT)    Recommendations for Other Services       Precautions / Restrictions Precautions Precautions: Back Precaution Booklet Issued: No Required Braces or Orthoses: Spinal Brace Spinal Brace: Applied in sitting position;Lumbar corset Restrictions Weight Bearing Restrictions: No    Mobility  Bed Mobility Overal bed mobility: Needs Assistance Bed Mobility: Rolling           General bed mobility comments: Rolling back to midline with Min A and pillow between legs, slow to mobilize and cues for hip flexion, able to extend LEs without assist to prepare for tilting.  Transfers                 General transfer comment: Pt standing in kreg tilt bed at 70 degrees and lowering to  60 degrees for comfort.  Ambulation/Gait                 Stairs             Wheelchair Mobility    Modified Rankin (Stroke Patients Only)       Balance Overall balance assessment: Needs assistance         Standing balance support: Bilateral upper extremity supported;During functional activity Standing balance-Leahy Scale: Poor Standing balance comment: Standing in Kreg tilt bed at 70 degrees and 60 degrees for ~35 minutes. Able to perform mini squats, weight shifting and pre gait activities- toe tapping and mini marches clearing bil feet with heavy reliance on UEs. Able to pull trunk away from bed on a few occasions with some discomfort.                            Cognition Arousal/Alertness: Awake/alert Behavior During Therapy: Anxious Overall Cognitive Status: Within Functional Limits for tasks assessed                                 General Comments: Anxiety seemed improved today in new KREG bed      Exercises      General Comments        Pertinent Vitals/Pain Pain Assessment: Faces Faces Pain Scale: Hurts little more Pain Location: right hip with certain movements and WB Pain Descriptors /  Indicators: Grimacing;Guarding;Aching;Stabbing Pain Intervention(s): Monitored during session;Repositioned;PCA encouraged    Home Living                      Prior Function            PT Goals (current goals can now be found in the care plan section) Progress towards PT goals: Progressing toward goals    Frequency    Min 5X/week      PT Plan Current plan remains appropriate    Co-evaluation PT/OT/SLP Co-Evaluation/Treatment: Yes Reason for Co-Treatment: Necessary to address cognition/behavior during functional activity;For patient/therapist safety;To address functional/ADL transfers PT goals addressed during session: Mobility/safety with mobility;Balance;Strengthening/ROM        AM-PAC PT "6 Clicks"  Mobility   Outcome Measure  Help needed turning from your back to your side while in a flat bed without using bedrails?: A Little Help needed moving from lying on your back to sitting on the side of a flat bed without using bedrails?: Total Help needed moving to and from a bed to a chair (including a wheelchair)?: Total Help needed standing up from a chair using your arms (e.g., wheelchair or bedside chair)?: Total Help needed to walk in hospital room?: Total Help needed climbing 3-5 steps with a railing? : Total 6 Click Score: 8    End of Session   Activity Tolerance: Patient tolerated treatment well Patient left: in bed;with call bell/phone within reach;Other (comment) (with OT in room) Nurse Communication: Mobility status PT Visit Diagnosis: Other abnormalities of gait and mobility (R26.89);Pain Pain - Right/Left: Right Pain - part of body: Hip     Time: 1093-2355 PT Time Calculation (min) (ACUTE ONLY): 52 min  Charges:  $Therapeutic Exercise: 8-22 mins $Therapeutic Activity: 8-22 mins                     Marisa Severin, PT, DPT Acute Rehabilitation Services Pager [phone] Office [place] 10/02/2020, 4:08 PM

## 2020-10-02 NOTE — Progress Notes (Signed)
Occupational Therapy Treatment Patient Details Name: Alexander Rojas MRN: [id number] DOB: [birth year] Today's Date: 10/02/2020    History of present illness pt is a 64 y/o male admitte with failure of the anterior and posterior instrumentation after a L5-S1 360 fusion with resultant back and R leg radicular pain.  Pt s/p exploration and removal of instrumentation at L5/S1, redo decompression at bil L% roots, intertransverse arthrodesis L4-S1 with bone grafting and segmental fixation L4 to ileum.  PMX:  HTN   OT comments  Pt seen in conjunction with PT.  He is making steady progress with ability to move and with pain control.  Pt initially with pain 6/10 in sidelying, but once he was able to move to supine and extend LEs pain improved significantly.  Pt moved to 70* tilt and tolerated well.  He was heavily reliant on bil. UE support and able to pull trunk away from bed, and was able to weight shift Lt and Rt.  Pt reports pain 3/10 when tilted.   Plan to attempt egress off bed with using RW vs EVA walker.   Pt instructed how to maneuver bed to allow him to optimally position himself.  Will continue to follow.   Follow Up Recommendations  Home health OT;Supervision/Assistance - 24 hour    Equipment Recommendations  3 in 1 bedside commode;Tub/shower seat    Recommendations for Other Services      Precautions / Restrictions Precautions Precautions: Back Precaution Booklet Issued: No Precaution Comments: reviewed back precautions with pt Required Braces or Orthoses: Spinal Brace Spinal Brace: Applied in sitting position;Lumbar corset Restrictions Weight Bearing Restrictions: No       Mobility Bed Mobility Overal bed mobility: Needs Assistance Bed Mobility: Rolling Rolling: Min assist         General bed mobility comments: Rolling back to midline with Min A and pillow between legs, slow to mobilize and cues for hip flexion, able to extend LEs without assist to prepare for  tilting.  Transfers                 General transfer comment: Pt standing in kreg tilt bed at 70 degrees and lowering to 60 degrees for comfort.    Balance Overall balance assessment: Needs assistance         Standing balance support: Bilateral upper extremity supported;During functional activity Standing balance-Leahy Scale: Poor Standing balance comment: Standing in Kreg tilt bed at 70 degrees and 60 degrees for ~35 minutes. Able to perform mini squats, weight shifting and pre gait activities- toe tapping and mini marches clearing bil feet with heavy reliance on UEs. Able to pull trunk away from bed on a few occasions with some discomfort.                           ADL either performed or assessed with clinical judgement   ADL Overall ADL's : Needs assistance/impaired                                             Vision       Perception     Praxis      Cognition Arousal/Alertness: Awake/alert Behavior During Therapy: WFL for tasks assessed/performed;Anxious Overall Cognitive Status: Within Functional Limits for tasks assessed  General Comments: Anxiety seemed improved today in new KREG bed        Exercises Other Exercises Other Exercises: Pt instructed in controls on bed to allow him to manipulate the bed for optimal positioning.  He was instructed to not move himself into chair or standing position   Shoulder Instructions       General Comments 02 sats 86 on RA when supine.  placed back on 2L    Pertinent Vitals/ Pain       Pain Assessment: Faces Faces Pain Scale: Hurts even more Pain Location: Rt hip with certain positions Pain Descriptors / Indicators: Grimacing;Guarding;Spasm;Sharp;Pressure;Radiating Pain Intervention(s): Monitored during session;Repositioned;PCA encouraged;Premedicated before session  Home Living                                           Prior Functioning/Environment              Frequency  Min 3X/week        Progress Toward Goals  OT Goals(current goals can now be found in the care plan section)  Progress towards OT goals: Progressing toward goals     Plan Discharge plan remains appropriate    Co-evaluation    PT/OT/SLP Co-Evaluation/Treatment: Yes Reason for Co-Treatment: Complexity of the patient's impairments (multi-system involvement);For patient/therapist safety PT goals addressed during session: Mobility/safety with mobility;Balance;Strengthening/ROM OT goals addressed during session: Strengthening/ROM;ADL's and self-care      AM-PAC OT "6 Clicks" Daily Activity     Outcome Measure   Help from another person eating meals?: A Little Help from another person taking care of personal grooming?: A Little Help from another person toileting, which includes using toliet, bedpan, or urinal?: Total Help from another person bathing (including washing, rinsing, drying)?: A Lot Help from another person to put on and taking off regular upper body clothing?: A Lot Help from another person to put on and taking off regular lower body clothing?: Total 6 Click Score: 12    End of Session Equipment Utilized During Treatment:  (Kreg tilt bed)  OT Visit Diagnosis: Pain Pain - Right/Left: Right Pain - part of body: Hip;Leg   Activity Tolerance Patient tolerated treatment well;Patient limited by pain   Patient Left in bed;with call bell/phone within reach   Nurse Communication Mobility status        Time: 9518-8416 OT Time Calculation (min): 70 min  Charges: OT General Charges $OT Visit: 1 Visit OT Treatments $Therapeutic Activity: 8-22 mins  Nilsa Nutting., OTR/L Acute Rehabilitation Services Pager [phone] Office [phone]    Lucille Passy M 10/02/2020, 4:34 PM

## 2020-10-02 NOTE — Progress Notes (Signed)
Patient evaluated.  Very satisfied with pain control.  Does have some pain with movement of his right leg however this is significantly improved.  His main complaint is pain that occurs whenever he starts to stand or performs any activity that involves use of his leg.   Discussion held with patient regarding medication usage.  He does note some benefit from the PCA however is wondering if he may have "a loading dose ".  Informed Dr. Genevive Bi is getting his Nucynta scheduled which provides him with some baseline opioid level.  Advise him to try to preemptively provide a bolus of his hydromorphone prior to ambulating or using his right leg.  He expressed understanding of this.  We will see how he progresses.  We can consider having an IV dose of   Hydromorphone available form however at some point will need to start transitioning to more oral medications in the near future.

## 2020-10-02 NOTE — Progress Notes (Signed)
Patient states that he has refused antihypertensives because he states his systolics have been around 110

## 2020-10-02 NOTE — Progress Notes (Signed)
Pt. Was educated on the need for bowel regimen medication as patient stated he did not wish to take them because he did not want to use the bed pan. I asked the patient to verbally explain the risk of not taking medication. Pt. Voiced understanding and once education was complete on my part pt. Took needed medications.

## 2020-10-03 ENCOUNTER — Other Ambulatory Visit: Payer: Self-pay

## 2020-10-03 ENCOUNTER — Encounter (HOSPITAL_COMMUNITY): Payer: Self-pay | Admitting: Neurological Surgery

## 2020-10-03 MED ORDER — HYDROMORPHONE HCL 1 MG/ML IJ SOLN
1.0000 mg | INTRAMUSCULAR | Status: DC | PRN
  Administered 2020-10-03 – 2020-10-04 (×2): 1 mg via INTRAVENOUS
  Administered 2020-10-05 – 2020-10-07 (×3): 2 mg via INTRAVENOUS
  Filled 2020-10-03 (×5): qty 2
  Filled 2020-10-03: qty 1

## 2020-10-03 NOTE — Progress Notes (Signed)
Occupational Therapy Treatment Patient Details Name: Alexander Rojas MRN: [id number] DOB: 08-15-1956 Today's Date: 10/03/2020    History of present illness pt is a 64 y/o male admitte with failure of the anterior and posterior instrumentation after a L5-S1 360 fusion with resultant back and R leg radicular pain.  Pt s/p exploration and removal of instrumentation at L5/S1, redo decompression at bil L% roots, intertransverse arthrodesis L4-S1 with bone grafting and segmental fixation L4 to ileum.  PMX:  HTN   OT comments  Pt is making excellent progress daily.  He was seen in conjunction with PT.  He is now able to roll Lt and Rt with min guard assist, and was able to tolerate full tilt 75* in Kreg bed, and able to extend hips and trunk away from the bed with min A - min A +2 to achieve full standing with EVA walker.  Pain is still a hindrance with pain 10/10 with full standing, but it quickly subsided to manageable levels once he returned to a supported position.   He requires set up assist to total A for ADLs at this time.  He does have LE weakness during activity.  Disposition changed to CIR.  Will continue to follow.   Follow Up Recommendations  Supervision/Assistance - 24 hour;CIR    Equipment Recommendations  3 in 1 bedside commode;Tub/shower seat    Recommendations for Other Services      Precautions / Restrictions Precautions Precautions: Back Precaution Booklet Issued: No Precaution Comments: reviewed back precautions with pt Required Braces or Orthoses: Spinal Brace Spinal Brace: Applied in sitting position;Lumbar corset       Mobility Bed Mobility Overal bed mobility: Needs Assistance Bed Mobility: Rolling Rolling: Min guard         General bed mobility comments: able to roll Lt and Rt with min guard assist with increased time.  Initial rolling resulted in increased pain of 10/10.  RN administered IV Dilauded with improvement in pain  Transfers                  General transfer comment: standing in Kreg bed - see comments below in other exercises    Balance Overall balance assessment: Needs assistance         Standing balance support: Bilateral upper extremity supported;During functional activity Standing balance-Leahy Scale: Poor Standing balance comment: In Kreg bed at 75* tilt and using EVA RW, pt requires up to min A and bil. UE support to move into full standing                           ADL either performed or assessed with clinical judgement   ADL                                               Vision       Perception     Praxis      Cognition Arousal/Alertness: Awake/alert Behavior During Therapy: [facility] for tasks assessed/performed;Anxious Overall Cognitive Status: Within Functional Limits for tasks assessed                                          Exercises Other Exercises Other Exercises: Pt initially asleep, but aroused  fairly easily.  Brace donned in supine with increased pain.  Bed moved into tilt position with pt heavily dependent on UE support.  Pt moved into full tilt at 75* with EVA walker in front of bed.  Pt worked on weight shifts and stepping in place with support of bed and EVA walker.  Pt initially with difficulty extending trunk and hips away from bed and required min A +2 initially with knees blocked to prevent buckling, but was able to advance to being able to pull hips away from bed with min A, and able to achieve brief, full erect standing with use of EVA walker.  Pain up to 10/10 briefly in Rt hip, but quickly subsided when he returned to supported standing against the bed, and then lowered back to supine.   Shoulder Instructions       General Comments VSS throughout session    Pertinent Vitals/ Pain       Pain Assessment: No/denies pain Pain Score: 10-Worst pain ever Pain Location: Rt hip with certain positions Pain Descriptors / Indicators:  Grimacing;Guarding;Spasm;Sharp;Pressure;Radiating;Pins and needles;Burning Pain Intervention(s): Monitored during session;Repositioned;Premedicated before session;Patient requesting pain meds-RN notified;RN gave pain meds during session  Home Living Family/patient expects to be discharged to:: Private residence Living Arrangements: Spouse/significant other                                      Prior Functioning/Environment              Frequency  Min 3X/week        Progress Toward Goals  OT Goals(current goals can now be found in the care plan section)  Progress towards OT goals: Progressing toward goals     Plan Discharge plan needs to be updated    Co-evaluation    PT/OT/SLP Co-Evaluation/Treatment: Yes Reason for Co-Treatment: To address functional/ADL transfers;For patient/therapist safety   OT goals addressed during session: Strengthening/ROM      AM-PAC OT "6 Clicks" Daily Activity     Outcome Measure   Help from another person eating meals?: None Help from another person taking care of personal grooming?: A Little Help from another person toileting, which includes using toliet, bedpan, or urinal?: Total Help from another person bathing (including washing, rinsing, drying)?: A Lot Help from another person to put on and taking off regular upper body clothing?: A Lot Help from another person to put on and taking off regular lower body clothing?: Total 6 Click Score: 13    End of Session Equipment Utilized During Treatment: Back brace;Other (comment) (EVA walker)  OT Visit Diagnosis: Pain Pain - Right/Left: Right Pain - part of body: Hip;Leg   Activity Tolerance Patient tolerated treatment well;Patient limited by pain   Patient Left in bed;with call bell/phone within reach   Nurse Communication Mobility status        Time: 9485-4627 OT Time Calculation (min): 64 min  Charges: OT General Charges $OT Visit: 1 Visit OT  Treatments $Therapeutic Activity: 23-37 mins  Nilsa Nutting., OTR/L Acute Rehabilitation Services Pager [phone] Office [phone]    Lucille Passy M 10/03/2020, 3:25 PM

## 2020-10-03 NOTE — Progress Notes (Signed)
Physical Therapy Treatment Patient Details Name: Alexander Rojas MRN: [id number] DOB: [birth year] Today's Date: 10/03/2020    History of Present Illness pt is a 64 y/o male admitte with failure of the anterior and posterior instrumentation after a L5-S1 360 fusion with resultant back and R leg radicular pain.  Pt s/p exploration and removal of instrumentation at L5/S1, redo decompression at bil L% roots, intertransverse arthrodesis L4-S1 with bone grafting and segmental fixation L4 to ileum.  PMX:  HTN    PT Comments    Patient making great progress towards goals. Tolerated standing in kreg tilt bed at 75 degrees; using EVA walker worked on bringing trunk/bottom away from bed for standing tolerance and upright. Difficulty extending through hips. Worked on weight shifting, marching and there ex in standing frame. Pt did not require straps when upright but required near by support especially through right knee due to pain and right knee instability. Tolerated standing for ~40 minutes and noted to have some buttock spasms and quivering of bil quads towards end. Able to roll with Min guard to donn/doff brace multiple times without assist. Continues to require encouragement and refocusing of attention by taking things once day at a time. Continues to make steady progress each session. Continue to recommend CIR. Will follow.    Follow Up Recommendations  CIR;Supervision for mobility/OOB     Equipment Recommendations  Rolling walker with 5" wheels;3in1 (PT)    Recommendations for Other Services       Precautions / Restrictions Precautions Precautions: Back Precaution Booklet Issued: No Precaution Comments: reviewed back precautions with pt Required Braces or Orthoses: Spinal Brace Spinal Brace: Applied in sitting position;Lumbar corset Restrictions Weight Bearing Restrictions: No    Mobility  Bed Mobility Overal bed mobility: Needs Assistance Bed Mobility: Rolling Rolling: Min guard          General bed mobility comments: able to roll Lt and Rt with min guard assist with increased time x4.  Initial rolling resulted in increased pain of 10/10.  RN administered IV Dilauded with improvement in pain.  Transfers Overall transfer level: Needs assistance               General transfer comment: standing in Kreg bed - see comments below in other exercises  Ambulation/Gait             General Gait Details: pt unable   Stairs             Wheelchair Mobility    Modified Rankin (Stroke Patients Only)       Balance Overall balance assessment: Needs assistance         Standing balance support: Bilateral upper extremity supported;During functional activity Standing balance-Leahy Scale: Poor Standing balance comment: In Kreg bed at 75* tilt and using EVA RW, pt requires up to min A and bil. UE support to move into full standing pulling self away from the bed that is supporting his trunk. Stood for ~40 minutes, noted to have quivering in bil quads towards end. Performed weight shifting and marching and worked on bringing trunk/bottom off bed to full standing. Flexed at hips due to weakness.                            Cognition Arousal/Alertness: Awake/alert Behavior During Therapy: WFL for tasks assessed/performed Overall Cognitive Status: Within Functional Limits for tasks assessed  Exercises Other Exercises Other Exercises: Pt initially asleep, but aroused fairly easily.  Brace donned in supine with increased pain.  Bed moved into tilt position with pt heavily dependent on UE support.  Pt moved into full tilt at 75* with EVA walker in front of bed.  Pt worked on weight shifts and stepping in place with support of bed and EVA walker.  Pt initially with difficulty extending trunk and hips away from bed and required min A +2 initially with knees blocked to prevent buckling, but was able to  advance to being able to pull hips away from bed with min A, and able to achieve brief, full erect standing with use of EVA walker.  Pain up to 10/10 briefly in Rt hip, but quickly subsided when he returned to supported standing against the bed, and then lowered back to supine.    General Comments General comments (skin integrity, edema, etc.): VSS throughout session.      Pertinent Vitals/Pain Pain Assessment: No/denies pain Pain Score: 10-Worst pain ever Pain Location: Rt hip with certain positions Pain Descriptors / Indicators: Grimacing;Guarding;Spasm;Sharp;Pressure;Radiating;Pins and needles;Burning Pain Intervention(s): Monitored during session;Repositioned;Premedicated before session;Patient requesting pain meds-RN notified;RN gave pain meds during session    Home Living Family/patient expects to be discharged to:: Private residence Living Arrangements: Spouse/significant other                  Prior Function            PT Goals (current goals can now be found in the care plan section) Progress towards PT goals: Progressing toward goals    Frequency    Min 5X/week      PT Plan Current plan remains appropriate    Co-evaluation PT/OT/SLP Co-Evaluation/Treatment: Yes Reason for Co-Treatment: For patient/therapist safety;To address functional/ADL transfers PT goals addressed during session: Mobility/safety with mobility;Balance;Strengthening/ROM OT goals addressed during session: Strengthening/ROM      AM-PAC PT "6 Clicks" Mobility   Outcome Measure  Help needed turning from your back to your side while in a flat bed without using bedrails?: A Little Help needed moving from lying on your back to sitting on the side of a flat bed without using bedrails?: Total Help needed moving to and from a bed to a chair (including a wheelchair)?: Total Help needed standing up from a chair using your arms (e.g., wheelchair or bedside chair)?: Total Help needed to walk in  hospital room?: Total Help needed climbing 3-5 steps with a railing? : Total 6 Click Score: 8    End of Session Equipment Utilized During Treatment: Back brace Activity Tolerance: Patient tolerated treatment well Patient left: in bed;with call bell/phone within reach;with family/visitor present Nurse Communication: Mobility status PT Visit Diagnosis: Other abnormalities of gait and mobility (R26.89);Pain Pain - Right/Left: Right Pain - part of body: Hip     Time: 6503-5465 PT Time Calculation (min) (ACUTE ONLY): 67 min  Charges:  $Therapeutic Activity: 23-37 mins                     Marisa Severin, PT, DPT Acute Rehabilitation Services Pager [phone] Office [phone]       Alexander Rojas 10/03/2020, 3:59 PM

## 2020-10-03 NOTE — Progress Notes (Signed)
Patient seen,  Progressing, satisfied with pain management.  PCA d/c'ed.   A/P 64 yo s/p lumbar fusion revision  1.  Suggest transitioning to oral hydromorphone when able.  Would start with 4mg  PO q3hrs.    2.  Continue Nucynta and gabapentin; can transition to [facility] ER 200mg  po Q12hrs upon discharge (ER not available in hospital).  3.  Will follow prn.

## 2020-10-03 NOTE — Progress Notes (Signed)
Subjective: Patient reports doing well and making slow improvements  Every day.  He has not been using his PCA very much.  Moving his legs around in bed much better Objective: Vital signs in last 24 hours: Temp:  [97.5 F (36.4 C)-98.6 F (37 C)] 98.4 F (36.9 C) (12/14 0815) Pulse Rate:  [47-77] 62 (12/14 0815) Resp:  [14-20] 20 (12/14 0815) BP: (103-123)/(68-86) 105/68 (12/14 0815) SpO2:  [85 %-99 %] 94 % (12/14 0815)  Intake/Output from previous day: 12/13 0701 - 12/14 0700 In: 1430 [P.O.:1430] Out: 2000 [Urine:2000] Intake/Output this shift: No intake/output data recorded.  Neurologic: Grossly normal  Lab Results: Lab Results  Component Value Date   WBC 12.1 (H) 09/30/2020   HGB 11.9 (L) 09/30/2020   HCT 33.6 (L) 09/30/2020   MCV 98.5 09/30/2020   PLT 267 09/30/2020   Lab Results  Component Value Date   INR 1.0 09/15/2020   BMET Lab Results  Component Value Date   NA 138 09/30/2020   K 4.2 09/30/2020   CL 106 09/30/2020   CO2 24 09/30/2020   GLUCOSE 125 (H) 09/30/2020   BUN 20 09/30/2020   CREATININE 0.71 09/30/2020   CALCIUM 8.1 (L) 09/30/2020    Studies/Results: No results found.  Assessment/Plan:  64 year old  Status post lumbar fusion revision.  He is slowly making progress.  He is  Able to move his legs around in bed without any pain.  Here to work with therapy today.  We are going to DC his PCA since he has not been using it very much.  We will order IV Dilaudid p.r.n.   LOS: 7 days    Alexander Rojas Sagal [place] 10/03/2020, 10:31 AM

## 2020-10-04 ENCOUNTER — Inpatient Hospital Stay (HOSPITAL_COMMUNITY): Payer: 59

## 2020-10-04 MED ORDER — DEXAMETHASONE SODIUM PHOSPHATE 4 MG/ML IJ SOLN: 2.0000 mg | Freq: Two times a day (BID) | INTRAMUSCULAR | Status: DC

## 2020-10-04 MED ORDER — DEXAMETHASONE 4 MG PO TABS
2.0000 mg | ORAL_TABLET | Freq: Two times a day (BID) | ORAL | Status: DC
  Administered 2020-10-04 – 2020-10-05 (×3): 2 mg via ORAL
  Filled 2020-10-04 (×3): qty 1

## 2020-10-04 NOTE — Progress Notes (Signed)
Occupational Therapy Progress Note  Pt seen in conjunction with PT.  He was able to progress to full standing and was able to ambulate in hallway today with min A+2 with use of EVA walker.  Pain up to 6-7/10 Rt hip and thigh.   He is heavily dependent on use of UEs for support and is unable to transition to a traditional RW at this time.  He requires set up to total A for ADLs.  He is unable to achieve a sitting position so unable to yet attempt [facility] transfer.  Continue to recommend CIR.    10/04/20 1223  OT Visit Information  Last OT Received On 10/04/20  Assistance Needed +2  PT/OT/SLP Co-Evaluation/Treatment Yes  Reason for Co-Treatment For patient/therapist safety;To address functional/ADL transfers;Complexity of the patient's impairments (multi-system involvement)  OT goals addressed during session Strengthening/ROM;ADL's and self-care  History of Present Illness pt is a 64 y/o male admitte with failure of the anterior and posterior instrumentation after a L5-S1 360 fusion with resultant back and R leg radicular pain.  Pt s/p exploration and removal of instrumentation at L5/S1, redo decompression at bil L% roots, intertransverse arthrodesis L4-S1 with bone grafting and segmental fixation L4 to ileum.  PMX:  HTN  Precautions  Precautions Back  Precaution Booklet Issued No  Precaution Comments reviewed back precautions with pt  Required Braces or Orthoses Spinal Brace  Spinal Brace Applied in sitting position;Lumbar corset  Pain Assessment  Pain Assessment No/denies pain  Faces Pain Scale 6  Pain Location Rt hip with Rt sidelying  Pain Descriptors / Indicators Grimacing;Guarding;Shooting;Radiating  Pain Intervention(s) Repositioned;Monitored during session;Limited activity within patient's tolerance;Patient requesting pain meds-RN notified;RN gave pain meds during session  Cognition  Arousal/Alertness Awake/alert  Behavior During Therapy [facility] for tasks assessed/performed  Overall  Cognitive Status Within Functional Limits for tasks assessed  Upper Extremity Assessment  Upper Extremity Assessment Generalized weakness  Lower Extremity Assessment  Lower Extremity Assessment Defer to PT evaluation  Bed Mobility  Overal bed mobility Needs Assistance  Bed Mobility Rolling  Rolling Supervision  General bed mobility comments able to roll Lt and Rt with heavy use of bedrails to don brace  Balance  Sitting balance - Comments Pt able to tolerate partial chair position at end of session with HOB ~55*  Standing balance support Bilateral upper extremity supported;During functional activity  Standing balance-Leahy Scale Poor  Standing balance comment Able to progress from tilt of 75* to standing with EVA walker and up to min A and heavy support of UEs  Transfers  Overall transfer level Needs assistance  Equipment used  (EVA walker)  Transfers Stand Pivot Transfers  General transfer comment Pt moved into full tilt on the Kreg bed 75*. with increased time and encouragement, he was able to move away from the bed in static standing with use of EVA walker with minA +2.  He was able to perform pivot transfer with min A +2 for safety using EVA walker - heavily reliant on UE support.  He currently is unable to utilize a regular RW due heavy reliance on UE support  OT - End of Session  Equipment Utilized During Treatment Back brace;Gait belt  Activity Tolerance Patient tolerated treatment well;Patient limited by pain  Patient left in bed;with call bell/phone within reach;with nursing/sitter in room  Nurse Communication Mobility status;Patient requests pain meds  OT Assessment/Plan  OT Plan Discharge plan remains appropriate  OT Visit Diagnosis Pain  Pain - Right/Left Right  Pain - part  of body Hip;Leg  OT Frequency (ACUTE ONLY) Min 3X/week  Recommendations for Other Services Rehab consult  Follow Up Recommendations Supervision/Assistance - 24 hour;CIR  OT Equipment 3 in 1 bedside  commode;Tub/shower seat  AM-PAC OT "6 Clicks" Daily Activity Outcome Measure (Version 2)  Help from another person eating meals? 4  Help from another person taking care of personal grooming? 3  Help from another person toileting, which includes using toliet, bedpan, or urinal? 1  Help from another person bathing (including washing, rinsing, drying)? 2  Help from another person to put on and taking off regular upper body clothing? 2  Help from another person to put on and taking off regular lower body clothing? 1  6 Click Score 13  OT Goal Progression  Progress towards OT goals Progressing toward goals  OT Time Calculation  OT Start Time (ACUTE ONLY) 1058  OT Stop Time (ACUTE ONLY) 1146  OT Time Calculation (min) 48 min  OT General Charges  $OT Visit 1 Visit  OT Treatments  $Therapeutic Activity 23-37 mins  Nilsa Nutting., OTR/L Acute Rehabilitation Services Pager [phone] Office [phone]

## 2020-10-04 NOTE — Progress Notes (Signed)
Occupational Therapy Progress Note  Pt provided with orange (level 2) theraband and was instructed in HEP for bil. UEs.  He was able to return demonstration.     10/04/20 1600  OT Visit Information  Last OT Received On 10/04/20  Assistance Needed +2  History of Present Illness pt is a 64 y/o male admitte with failure of the anterior and posterior instrumentation after a L5-S1 360 fusion with resultant back and R leg radicular pain.  Pt s/p exploration and removal of instrumentation at L5/S1, redo decompression at bil L% roots, intertransverse arthrodesis L4-S1 with bone grafting and segmental fixation L4 to ileum.  PMX:  HTN  Precautions  Precautions Back  Precaution Booklet Issued No  Precaution Comments reviewed back precautions with pt  Required Braces or Orthoses Spinal Brace  Pain Assessment  Pain Assessment 0-10  Pain Score 2  Pain Location Rt hip  Pain Descriptors / Indicators Spasm  Pain Intervention(s) Monitored during session  Cognition  Arousal/Alertness Awake/alert  Behavior During Therapy WFL for tasks assessed/performed  Overall Cognitive Status Within Functional Limits for tasks assessed  Upper Extremity Assessment  Upper Extremity Assessment Generalized weakness  Other Exercises  Other Exercises Pt provided with orange (level 2) theraband, and was instructed in chest press, shoulder flexion and horiztontal abduction, adduction.  He was able to return demonstration  OT - End of Session  Activity Tolerance Patient tolerated treatment well;Patient limited by pain  Patient left in bed;with call bell/phone within reach  Nurse Communication Mobility status  OT Assessment/Plan  OT Plan Discharge plan remains appropriate  OT Visit Diagnosis Pain  Pain - Right/Left Right  Pain - part of body Hip;Leg  OT Frequency (ACUTE ONLY) Min 3X/week  Recommendations for Other Services Rehab consult  Follow Up Recommendations Supervision/Assistance - 24 hour;CIR  OT Equipment 3 in  1 bedside commode;Tub/shower seat  AM-PAC OT "6 Clicks" Daily Activity Outcome Measure (Version 2)  Help from another person eating meals? 4  Help from another person taking care of personal grooming? 3  Help from another person toileting, which includes using toliet, bedpan, or urinal? 1  Help from another person bathing (including washing, rinsing, drying)? 2  Help from another person to put on and taking off regular upper body clothing? 2  Help from another person to put on and taking off regular lower body clothing? 1  6 Click Score 13  OT Goal Progression  Progress towards OT goals Progressing toward goals  OT Time Calculation  OT Start Time (ACUTE ONLY) 1601  OT Stop Time (ACUTE ONLY) 1615  OT Time Calculation (min) 14 min  OT General Charges  $OT Visit 1 Visit  OT Treatments  $Therapeutic Exercise 8-22 mins  Nilsa Nutting., OTR/L Acute Rehabilitation Services Pager [phone] Office [phone]

## 2020-10-04 NOTE — Progress Notes (Addendum)
PT Progress Note  Pt able to attain upright stance using the Kreg tilt bed, pull to full upright position with assist and using the EVA walker and minimal support, head down the hall with significant support of his UE's.    10/04/20 1437  PT Visit Information  Last PT Received On 10/04/20  Assistance Needed +2  PT/OT/SLP Co-Evaluation/Treatment Yes  Reason for Co-Treatment For patient/therapist safety  PT goals addressed during session Mobility/safety with mobility  OT goals addressed during session Strengthening/ROM  History of Present Illness pt is a 64 y/o male admitte with failure of the anterior and posterior instrumentation after a L5-S1 360 fusion with resultant back and R leg radicular pain.  Pt s/p exploration and removal of instrumentation at L5/S1, redo decompression at bil L% roots, intertransverse arthrodesis L4-S1 with bone grafting and segmental fixation L4 to ileum.  PMX:  HTN  Precautions  Precautions Back  Precaution Booklet Issued No  Precaution Comments reviewed back precautions with pt  Required Braces or Orthoses Spinal Brace  Spinal Brace Applied in sitting position;Lumbar corset  Pain Assessment  Pain Assessment 0-10  Faces Pain Scale 6  Pain Location Rt hip with Rt sidelying  Pain Descriptors / Indicators Grimacing;Guarding;Shooting;Radiating  Pain Intervention(s) Monitored during session;Repositioned  Cognition  Arousal/Alertness Awake/alert  Behavior During Therapy WFL for tasks assessed/performed  Overall Cognitive Status Within Functional Limits for tasks assessed  Bed Mobility  Overal bed mobility Needs Assistance  Bed Mobility Rolling  Rolling Supervision  General bed mobility comments able to roll Lt and Rt with heavy use of bedrails to don brace  Transfers  Overall transfer level Needs assistance  Equipment used  (EVA walker)  Transfers Stand Pivot Transfers  General transfer comment Pt moved into full tilt on the Kreg bed 75*. with increased  time and encouragement, he was able to move away from the bed in static standing with use of EVA walker with minA +2.  He was able to perform pivot transfer with min A +2 for safety using EVA walker - heavily reliant on UE support.  He currently is unable to utilize a regular RW due heavy reliance on UE support  Ambulation/Gait  Ambulation/Gait assistance Min assist;Mod assist;+2 physical assistance  Gait Distance (Feet) 70 Feet  Assistive device  (EVA RW)  Gait Pattern/deviations Step-to pattern;Step-through pattern;Decreased step length - right;Decreased step length - left;Decreased stride length;Decreased stance time - right  General Gait Details Pt with heavy reliance on UE's on the EVA walker and thus note needing significant assist once up onto the EVA.  Pt generally up on forefoot R LE unless flexs at hips and rocks back on R heel, but with work can attain erect posture and take a few steps with escalating pain.  Gait velocity slow  Gait velocity interpretation <1.31 ft/sec, indicative of household ambulator  Balance  Sitting balance - Comments Pt able to tolerate partial chair position at end of session with HOB ~55*  Standing balance support Bilateral upper extremity supported;During functional activity  Standing balance-Leahy Scale Poor  Standing balance comment Able to progress from tilt of 75* to standing with EVA walker and up to min A and heavy support of UEs  General Comments  General comments (skin integrity, edema, etc.) Pt reports he slept well last night and that pain is significantly improved this date.  He had several questions re: process for CIR admission.   discussed alternative strategies for pain management and fear/anxiety associated with severe pain.  PT -  End of Session  Equipment Utilized During Treatment Back brace  Activity Tolerance Patient tolerated treatment well  Patient left in bed;with call bell/phone within reach;with family/visitor present  Nurse  Communication Mobility status   PT - Assessment/Plan  PT Plan Current plan remains appropriate  PT Visit Diagnosis Other abnormalities of gait and mobility (R26.89);Pain  Pain - Right/Left Right  Pain - part of body Hip;Leg  PT Frequency (ACUTE ONLY) Min 5X/week  Follow Up Recommendations CIR;Supervision for mobility/OOB  PT equipment Rolling walker with 5" wheels;3in1 (PT)  AM-PAC PT "6 Clicks" Mobility Outcome Measure (Version 2)  Help needed turning from your back to your side while in a flat bed without using bedrails? 2  Help needed moving from lying on your back to sitting on the side of a flat bed without using bedrails? 1  Help needed moving to and from a bed to a chair (including a wheelchair)? 1  Help needed standing up from a chair using your arms (e.g., wheelchair or bedside chair)? 1  Help needed to walk in hospital room? 1  Help needed climbing 3-5 steps with a railing?  1  6 Click Score 7  Consider Recommendation of Discharge To: CIR/SNF/LTACH  PT Goal Progression  Progress towards PT goals Progressing toward goals  Acute Rehab PT Goals  PT Goal Formulation With patient  Time For Goal Achievement 10/12/20  Potential to Achieve Goals Good  PT Time Calculation  PT Start Time (ACUTE ONLY) 1058  PT Stop Time (ACUTE ONLY) 1146  PT Time Calculation (min) (ACUTE ONLY) 48 min  1 Gait charge  10/04/2020   Ginger Carne., PT Acute Rehabilitation Services [phone]  (pager) [phone]  (office)

## 2020-10-04 NOTE — Progress Notes (Signed)
Occupational Therapy Treatment Patient Details Name: Alexander Rojas MRN: [id number] DOB: 1956-07-16 Today's Date: 10/04/2020    History of present illness pt is a 64 y/o male admitte with failure of the anterior and posterior instrumentation after a L5-S1 360 fusion with resultant back and R leg radicular pain.  Pt s/p exploration and removal of instrumentation at L5/S1, redo decompression at bil L% roots, intertransverse arthrodesis L4-S1 with bone grafting and segmental fixation L4 to ileum.  PMX:  HTN   OT comments  Pt able to perform bed mobility today with min guard assist to supervision.  He does have increased pain when sidelying on his Rt limiting his ability to maintain Rt sidelying and preventing his ability to move sidelying to sit.  He was able to move through his pain to roll back on his back with encouragement.  Continued discussion of alternative strategies to cope with pain and pain associated anxiety.  Will continue to follow.   Follow Up Recommendations  Supervision/Assistance - 24 hour;CIR    Equipment Recommendations  3 in 1 bedside commode;Tub/shower seat    Recommendations for Other Services Rehab consult    Precautions / Restrictions Precautions Precautions: Back Precaution Booklet Issued: No Precaution Comments: reviewed back precautions with pt Required Braces or Orthoses: Spinal Brace Spinal Brace: Applied in sitting position;Lumbar corset       Mobility Bed Mobility Overal bed mobility: Needs Assistance Bed Mobility: Rolling Rolling: Min guard         General bed mobility comments: Pt able to roll Lt and Rt with min guard assist.  He experienced increased pain in Rt hip with Rt sidelying.  He experienced increased pain when rolling back onto his back and required verbal cues and coaxing to perform without assist.  Pain increased significantly, but decreased once he extended his Rt LE and trunk  Transfers                      Balance        Sitting balance - Comments: Pt unable to progress to seated EOB due increased pain                                   ADL either performed or assessed with clinical judgement   ADL Overall ADL's : Needs assistance/impaired                                             Vision       Perception     Praxis      Cognition Arousal/Alertness: Awake/alert Behavior During Therapy: WFL for tasks assessed/performed Overall Cognitive Status: Within Functional Limits for tasks assessed                                          Exercises     Shoulder Instructions       General Comments Pt reports he slept well last night and that pain is significantly improved this date.  He had several questions re: process for CIR admission.   discussed alternative strategies for pain management and fear/anxiety associated with severe pain.    Pertinent Vitals/ Pain  Pain Assessment: 0-10 Faces Pain Scale: Hurts even more Pain Location: Rt hip with Rt sidelying Pain Descriptors / Indicators: Grimacing;Guarding;Shooting;Radiating Pain Intervention(s): Limited activity within patient's tolerance;Monitored during session;Repositioned  Home Living                                          Prior Functioning/Environment              Frequency  Min 3X/week        Progress Toward Goals  OT Goals(current goals can now be found in the care plan section)  Progress towards OT goals: Progressing toward goals     Plan Discharge plan needs to be updated    Co-evaluation                 AM-PAC OT "6 Clicks" Daily Activity     Outcome Measure   Help from another person eating meals?: None Help from another person taking care of personal grooming?: A Little Help from another person toileting, which includes using toliet, bedpan, or urinal?: Total Help from another person bathing (including washing, rinsing,  drying)?: A Lot Help from another person to put on and taking off regular upper body clothing?: A Lot Help from another person to put on and taking off regular lower body clothing?: Total 6 Click Score: 13    End of Session    OT Visit Diagnosis: Pain Pain - Right/Left: Right Pain - part of body: Hip;Leg   Activity Tolerance Patient tolerated treatment well   Patient Left in bed;with call bell/phone within reach   Nurse Communication Mobility status        Time: 1001-1034 OT Time Calculation (min): 33 min  Charges: OT General Charges $OT Visit: 1 Visit OT Treatments $Therapeutic Activity: 23-37 mins  Nilsa Nutting., OTR/L Acute Rehabilitation Services Pager [phone] Office [phone]    Lucille Passy M 10/04/2020, 12:20 PM

## 2020-10-04 NOTE — Progress Notes (Signed)
Xray reviewed, no change from intra-op films, cage at L5-S1 is stable and the screws look good. Hopefully this is a sign the cage is in a low energy state and will heal in the current position and no anterior re-construction will be necessary.

## 2020-10-04 NOTE — Progress Notes (Signed)
Improvement is slow but steady.  He does not have back pain.  If he straightens his right leg and puts weight on it walking with physical therapy right now he gets pain in the buttock and posterior thigh.  The pain can be severe.  It will go away with change of position.  His brace is in place.  He seems to have good strength in his legs other than deconditioning.  He denies numbness or tingling.  He is walking in the hallway with the use of a rolling assistive device and the therapists.  Going to try to get a lateral lumbar spine x-ray today if we can.  To new pain control.  Continue Lovenox.  Continue to try to mobilize.  Likely need CIR.  To need to try to get him to have a bowel movement.

## 2020-10-04 NOTE — Progress Notes (Signed)
Inpatient Rehab Admissions Coordinator:    at this time, we are recommending a CIR consult and I will place an order per our protocol.  Please contact me with questions.   Shann Medal, PT, DPT Admissions Coordinator [phone] 10/04/20  10:43 AM

## 2020-10-05 MED ORDER — MAGNESIUM HYDROXIDE 400 MG/5ML PO SUSP: 15.0000 mL | Freq: Every day | ORAL | Status: DC | PRN

## 2020-10-05 MED ORDER — FLEET ENEMA 7-19 GM/118ML RE ENEM
1.0000 | ENEMA | Freq: Every day | RECTAL | Status: DC | PRN
  Administered 2020-10-05 – 2020-10-08 (×2): 1 via RECTAL
  Filled 2020-10-05 (×2): qty 1

## 2020-10-05 NOTE — Progress Notes (Deleted)
Progress Note  Pt steadily improving toward goals.  Still using bed tilt to get to standing, but about ready to start egress from the side of the bed.  Pain is still a limiting factor, but noticeably improving daily.    10/05/20 1200  PT Visit Information  Last PT Received On 10/05/20  Assistance Needed +1  History of Present Illness pt is a 64 y/o male admitte with failure of the anterior and posterior instrumentation after a L5-S1 360 fusion with resultant back and R leg radicular pain.  Pt s/p exploration and removal of instrumentation at L5/S1, redo decompression at bil L% roots, intertransverse arthrodesis L4-S1 with bone grafting and segmental fixation L4 to ileum.  PMX:  HTN  Subjective Data  Subjective It is definitely better today.  I'm pain free when i stand straight today.  Patient Stated Goal to return to work  Precautions  Precautions Back  Precaution Comments reviewed back precautions with pt  Required Braces or Orthoses Spinal Brace  Spinal Brace Applied in sitting position;Lumbar corset  Pain Assessment  Pain Assessment 0-10  Pain Score 6  Faces Pain Scale  (at worst)  Pain Location Rt hip  Pain Intervention(s) Monitored during session  Cognition  Arousal/Alertness Awake/alert  Behavior During Therapy [facility] for tasks assessed/performed  Overall Cognitive Status Within Functional Limits for tasks assessed  Bed Mobility  Overal bed mobility Needs Assistance  Bed Mobility Rolling  Rolling Supervision (some use of the rail)  General bed mobility comments lighter use of the rail  Transfers  General transfer comment Pt moved into full tilt on the Kreg bed 75*. with increased time and encouragement, he was able to move away from the bed in static standing with use of EVA walker with minA +2.  He was able to perform pivot transfer with min A +2 for safety using EVA walker - heavily reliant on UE support.  He currently is unable to utilize a regular RW due heavy reliance on  UE support  Ambulation/Gait  Ambulation/Gait assistance Min assist  Gait Distance (Feet) 130 Feet  Assistive device  (EVA walker)  Gait Pattern/deviations Step-through pattern  General Gait Details progression from heavy w/bearing through elbows to lighter use of UE's, and from w/bearing through R forefoot to improved heel toe pattern.  Gait velocity slow  Gait velocity interpretation <1.31 ft/sec, indicative of household ambulator  Balance  Overall balance assessment Needs assistance  Sitting balance - Comments Pt able to tolerate partial chair position at end of session with HOB ~55*  Standing balance support Bilateral upper extremity supported;During functional activity  Standing balance-Leahy Scale Poor  Standing balance comment Able to progress from tilt of 75* to standing with EVA walker and up to min A and heavy support of UEs  PT - End of Session  Equipment Utilized During Treatment Back brace  Activity Tolerance Patient tolerated treatment well  Patient left in bed;with call bell/phone within reach;with family/visitor present  Nurse Communication Mobility status   PT - Assessment/Plan  PT Plan Current plan remains appropriate  PT Visit Diagnosis Other abnormalities of gait and mobility (R26.89);Pain  Pain - Right/Left Right  Pain - part of body Hip;Leg  PT Frequency (ACUTE ONLY) Min 5X/week  Follow Up Recommendations CIR;Supervision for mobility/OOB  PT equipment Rolling walker with 5" wheels;3in1 (PT)  AM-PAC PT "6 Clicks" Mobility Outcome Measure (Version 2)  Help needed turning from your back to your side while in a flat bed without using bedrails? 3  Help  needed moving from lying on your back to sitting on the side of a flat bed without using bedrails? 1  Help needed moving to and from a bed to a chair (including a wheelchair)? 1  Help needed standing up from a chair using your arms (e.g., wheelchair or bedside chair)? 1  Help needed to walk in hospital room? 3  Help  needed climbing 3-5 steps with a railing?  1  6 Click Score 10  Consider Recommendation of Discharge To: CIR/SNF/LTACH  PT Goal Progression  Progress towards PT goals Progressing toward goals  Acute Rehab PT Goals  PT Goal Formulation With patient  Time For Goal Achievement 10/12/20  Potential to Achieve Goals Good  PT Time Calculation  PT Start Time (ACUTE ONLY) 1201  PT Stop Time (ACUTE ONLY) 1230  PT Time Calculation (min) (ACUTE ONLY) 29 min  PT General Charges  $$ ACUTE PT VISIT 1 Visit  PT Treatments  $Gait Training 8-22 mins  $Therapeutic Activity 8-22 mins    10/05/2020  Ginger Carne., PT Acute Rehabilitation Services [phone]  (pager) [phone]  (office)

## 2020-10-05 NOTE — Progress Notes (Signed)
Subjective: Patient reports slept really well last night. Pain is moderate but comfortable when he is laying in bed.   Objective: Vital signs in last 24 hours: Temp:  [98.3 F (36.8 C)-98.6 F (37 C)] 98.5 F (36.9 C) (12/15 2308) Pulse Rate:  [60] 60 (12/15 1644) Resp:  [15-20] 18 (12/16 0310) BP: (101-120)/(68-78) 101/78 (12/16 0310) SpO2:  [96 %] 96 % (12/15 1644)  Intake/Output from previous day: 12/15 0701 - 12/16 0700 In: 600 [P.O.:600] Out: 1650 [Urine:1650] Intake/Output this shift: No intake/output data recorded.  Neurologic: Grossly normal  Lab Results: Lab Results  Component Value Date   WBC 12.1 (H) 09/30/2020   HGB 11.9 (L) 09/30/2020   HCT 33.6 (L) 09/30/2020   MCV 98.5 09/30/2020   PLT 267 09/30/2020   Lab Results  Component Value Date   INR 1.0 09/15/2020   BMET Lab Results  Component Value Date   NA 138 09/30/2020   K 4.2 09/30/2020   CL 106 09/30/2020   CO2 24 09/30/2020   GLUCOSE 125 (H) 09/30/2020   BUN 20 09/30/2020   CREATININE 0.71 09/30/2020   CALCIUM 8.1 (L) 09/30/2020    Studies/Results: DG Lumbar Spine 1 View  Result Date: 10/04/2020 CLINICAL DATA:  Postoperative pain EXAM: LUMBAR SPINE - 1 VIEW COMPARISON:  09/27/2020, 09/25/2020 FINDINGS: No fracture of the lumbar spine. Redemonstrated findings of L5-S1 discectomy with with anterolisthesis of L5 on S1 and L4 through S2 posterior fusion. Findings are not significant changed compared to intraoperative fluoroscopic images dated 09/27/2020. No evidence of perihardware fracture. IMPRESSION: Redemonstrated findings of L5-S1 discectomy with with anterolisthesis of L5 on S1 and L4 through S2 posterior fusion. Findings are not significant changed compared to intraoperative fluoroscopic images dated 09/27/2020. No evidence of perihardware fracture. Electronically Signed   By: Eddie Candle M.D.   On: 10/04/2020 12:41    Assessment/Plan: 64 year old status post redo lumbar fusion. Continue to  work with therapy today. I will put in CIR consult. He is eager to get to rehab. Goal for the day is to get his bowels moving. Will adjust his medications for this.    LOS: 9 days    Ocie Cornfield Duke Weisensel 10/05/2020, 8:28 AM

## 2020-10-05 NOTE — Progress Notes (Signed)
Inpatient Rehab Admissions:  Inpatient Rehab Consult received.  I met with patient at the bedside for rehabilitation assessment and to discuss goals and expectations of an inpatient rehab admission.  We discussed goals of supervision to mod I, with average LOS on rehab to be about 2 weeks.  Pt home with his wife, who works during the day.  Very motivated to return to PLOF (he is a Psychologist, sport and exercise, as well as a professor).  Will open insurance for prior auth and follow for potential admit pending approval and bed availability.   Signed: Shann Medal, PT, DPT Admissions Coordinator [phone] 10/05/20  4:07 PM

## 2020-10-05 NOTE — Progress Notes (Signed)
Physical Therapy Treatment Patient Details Name: Alexander Rojas MRN: [id number] DOB: April 07, 1956 Today's Date: 10/05/2020    History of Present Illness pt is a 64 y/o male admitte with failure of the anterior and posterior instrumentation after a L5-S1 360 fusion with resultant back and R leg radicular pain.  Pt s/p exploration and removal of instrumentation at L5/S1, redo decompression at bil L% roots, intertransverse arthrodesis L4-S1 with bone grafting and segmental fixation L4 to ileum.  PMX:  HTN    PT Comments    Improving well toward goals.  Pt still utilizing the standing/tilt to attain standing, but voices ready to try rolling to get to EOB soon.  Emphasis on gait stability and upright posture.    Follow Up Recommendations  CIR;Supervision for mobility/OOB     Equipment Recommendations  Rolling walker with 5" wheels;3in1 (PT)    Recommendations for Other Services       Precautions / Restrictions Precautions Precautions: Back Precaution Comments: reviewed back precautions with pt Required Braces or Orthoses: Spinal Brace Spinal Brace: Applied in sitting position;Lumbar corset    Mobility  Bed Mobility Overal bed mobility: Needs Assistance Bed Mobility: Rolling Rolling: Supervision (some use of the rail)         General bed mobility comments: lighter use of the rail  Transfers                 General transfer comment: Pt moved into full tilt on the Kreg bed 75*. with increased time and encouragement, he was able to move away from the bed in static standing with use of EVA walker with minA +2.  He was able to perform pivot transfer with min A +2 for safety using EVA walker - heavily reliant on UE support.  He currently is unable to utilize a regular RW due heavy reliance on UE support  Ambulation/Gait Ambulation/Gait assistance: Min Web designer (Feet): 130 Feet Assistive device:  (EVA walker) Gait Pattern/deviations: Step-through pattern Gait  velocity: slow Gait velocity interpretation: <1.31 ft/sec, indicative of household ambulator General Gait Details: progression from heavy w/bearing through elbows to lighter use of UE's, and from w/bearing through R forefoot to improved heel toe pattern.   Stairs             Wheelchair Mobility    Modified Rankin (Stroke Patients Only)       Balance Overall balance assessment: Needs assistance     Sitting balance - Comments: Pt able to tolerate partial chair position at end of session with HOB ~55*   Standing balance support: Bilateral upper extremity supported;During functional activity Standing balance-Leahy Scale: Poor Standing balance comment: Able to progress from tilt of 75* to standing with EVA walker and up to min A and heavy support of UEs                            Cognition Arousal/Alertness: Awake/alert Behavior During Therapy: WFL for tasks assessed/performed Overall Cognitive Status: Within Functional Limits for tasks assessed                                        Exercises      General Comments        Pertinent Vitals/Pain Pain Assessment: 0-10 Pain Score: 6  Faces Pain Scale:  (at worst) Pain Location: Rt hip Pain Intervention(s): Monitored during session  Home Living                      Prior Function            PT Goals (current goals can now be found in the care plan section) Acute Rehab PT Goals Patient Stated Goal: to return to work PT Goal Formulation: With patient Time For Goal Achievement: 10/12/20 Potential to Achieve Goals: Good Progress towards PT goals: Progressing toward goals    Frequency    Min 5X/week      PT Plan Current plan remains appropriate    Co-evaluation              AM-PAC PT "6 Clicks" Mobility   Outcome Measure  Help needed turning from your back to your side while in a flat bed without using bedrails?: A Little Help needed moving from lying on  your back to sitting on the side of a flat bed without using bedrails?: Total Help needed moving to and from a bed to a chair (including a wheelchair)?: Total Help needed standing up from a chair using your arms (e.g., wheelchair or bedside chair)?: Total Help needed to walk in hospital room?: A Little Help needed climbing 3-5 steps with a railing? : Total 6 Click Score: 10    End of Session Equipment Utilized During Treatment: Back brace Activity Tolerance: Patient tolerated treatment well Patient left: in bed;with call bell/phone within reach;with family/visitor present Nurse Communication: Mobility status PT Visit Diagnosis: Other abnormalities of gait and mobility (R26.89);Pain Pain - Right/Left: Right Pain - part of body: Hip;Leg     Time: 1201-1230 PT Time Calculation (min) (ACUTE ONLY): 29 min  Charges:  $Gait Training: 8-22 mins $Therapeutic Activity: 8-22 mins                     10/05/2020  Alexander Rojas., PT Acute Rehabilitation Services [phone]  (pager) [phone]  (office)   Alexander Rojas 10/05/2020, 12:47 PM

## 2020-10-06 MED ORDER — DEXAMETHASONE SODIUM PHOSPHATE 4 MG/ML IJ SOLN
2.0000 mg | Freq: Two times a day (BID) | INTRAMUSCULAR | Status: DC
  Administered 2020-10-06 – 2020-10-07 (×2): 2 mg via INTRAVENOUS
  Filled 2020-10-06 (×3): qty 1

## 2020-10-06 MED ORDER — DEXAMETHASONE 0.5 MG PO TABS
1.0000 mg | ORAL_TABLET | Freq: Two times a day (BID) | ORAL | Status: DC
  Administered 2020-10-06 – 2020-10-08 (×3): 1 mg via ORAL
  Filled 2020-10-06 (×6): qty 2

## 2020-10-06 MED ORDER — MAGNESIUM HYDROXIDE 400 MG/5ML PO SUSP: 15.0000 mL | Freq: Every day | ORAL | Status: DC | PRN

## 2020-10-06 NOTE — Progress Notes (Signed)
Inpatient Rehab Admissions Coordinator:   Awaiting determination from [facility] on prior auth for CIR.  Likely no beds available this weekend. I will f/u on Monday.   Shann Medal, PT, DPT Admissions Coordinator [phone] 10/06/20  1:34 PM

## 2020-10-06 NOTE — Progress Notes (Signed)
Doing better, walked 150 ft yesterday and had 5 BMs, less leg pain , no NTW. Pleased with steady progress.

## 2020-10-06 NOTE — Progress Notes (Signed)
Physical Therapy Treatment Patient Details Name: Alexander Rojas MRN: [id number] DOB: [birth year] Today's Date: 10/06/2020    History of Present Illness pt is a 64 y/o male admitte with failure of the anterior and posterior instrumentation after a L5-S1 360 fusion with resultant back and R leg radicular pain.  Pt s/p exploration and removal of instrumentation at L5/S1, redo decompression at bil L% roots, intertransverse arthrodesis L4-S1 with bone grafting and segmental fixation L4 to ileum.  PMX:  HTN    PT Comments    Steady improvement toward goals, Emphasis on normalize mobility, rolling to get up to [facility], scooting and sitting EOB, transitioning to stand from the EOB, progressing gait in the EVA walker and then ending up sitting in the recliner.  Also reinforce education today.    Follow Up Recommendations  CIR;Supervision for mobility/OOB     Equipment Recommendations  Rolling walker with 5" wheels;3in1 (PT)    Recommendations for Other Services       Precautions / Restrictions Precautions Precautions: Back Precaution Comments: reviewed back precautions with pt Required Braces or Orthoses: Spinal Brace Spinal Brace: Applied in supine position;Lumbar corset Restrictions Weight Bearing Restrictions: No    Mobility  Bed Mobility Overal bed mobility: Needs Assistance Bed Mobility: Rolling Rolling: Min assist (without heavy use of the rail) Sidelying to sit: Max assist       General bed mobility comments: cues for technique, assist of legs and trunk throughout motion.  Transfers Overall transfer level: Needs assistance   Transfers: Sit to/from Stand Sit to Stand: Mod assist;From elevated surface (sit to stand from bed  (not using tilt)            Ambulation/Gait Ambulation/Gait assistance: Min assist;Min guard Gait Distance (Feet): 100 Feet (the 83 and 70 feet with rests in standing with UE support, less weight in R LE) Assistive device:  (EVA walker) Gait  Pattern/deviations: Step-through pattern Gait velocity: slow Gait velocity interpretation: <1.8 ft/sec, indicate of risk for recurrent falls General Gait Details: steady gait with moderate w/bearing through elbows on EVA walker.  good heel/toe with R LE, narrowed BOS.  Frequent need to stop and arch back to relieve pain.   Stairs             Wheelchair Mobility    Modified Rankin (Stroke Patients Only)       Balance Overall balance assessment: Needs assistance Sitting-balance support: Single extremity supported;Bilateral upper extremity supported Sitting balance-Leahy Scale: Poor Sitting balance - Comments: pt still unable to sit EOB without UE assist due to pain less than balance.   Standing balance support: Bilateral upper extremity supported;During functional activity Standing balance-Leahy Scale: Poor (to fair.  Unable to stand without UE support due to pain.)                              Cognition Arousal/Alertness: Awake/alert Behavior During Therapy: WFL for tasks assessed/performed Overall Cognitive Status: Within Functional Limits for tasks assessed                                 General Comments: Anxiety seemed improved today.  Pt able to work though and problem solve with a clearer head.      Exercises      General Comments General comments (skin integrity, edema, etc.): Reinforced back care/precautions, log roll, progression of activity  Pertinent Vitals/Pain Pain Assessment: Faces Pain Score: 5  Pain Location: Rt hip    Home Living                      Prior Function            PT Goals (current goals can now be found in the care plan section) Acute Rehab PT Goals Patient Stated Goal: to return to work PT Goal Formulation: With patient Time For Goal Achievement: 10/12/20 Potential to Achieve Goals: Good Progress towards PT goals: Progressing toward goals    Frequency    Min 5X/week      PT  Plan Current plan remains appropriate    Co-evaluation              AM-PAC PT "6 Clicks" Mobility   Outcome Measure  Help needed turning from your back to your side while in a flat bed without using bedrails?: A Little Help needed moving from lying on your back to sitting on the side of a flat bed without using bedrails?: A Lot Help needed moving to and from a bed to a chair (including a wheelchair)?: A Lot Help needed standing up from a chair using your arms (e.g., wheelchair or bedside chair)?: A Little Help needed to walk in hospital room?: A Little Help needed climbing 3-5 steps with a railing? : A Lot 6 Click Score: 15    End of Session Equipment Utilized During Treatment: Back brace Activity Tolerance: Patient tolerated treatment well Patient left: in chair;with call bell/phone within reach;with chair alarm set Nurse Communication: Mobility status PT Visit Diagnosis: Other abnormalities of gait and mobility (R26.89);Pain Pain - Right/Left: Right Pain - part of body: Hip;Leg     Time: 9563-8756 PT Time Calculation (min) (ACUTE ONLY): 44 min  Charges:  $Gait Training: 8-22 mins $Therapeutic Activity: 8-22 mins $Self Care/Home Management: 8-22                      10/06/2020  Ginger Carne., PT Acute Rehabilitation Services [phone]  (pager) [phone]  (office)   Tessie Fass Krist Rosenboom 10/06/2020, 11:57 AM

## 2020-10-06 NOTE — Progress Notes (Signed)
Physical Therapy Treatment Patient Details Name: Alexander Rojas MRN: [id number] DOB: [birth year] Today's Date: 10/06/2020    History of Present Illness pt is a 64 y/o male admitte with failure of the anterior and posterior instrumentation after a L5-S1 360 fusion with resultant back and R leg radicular pain.  Pt s/p exploration and removal of instrumentation at L5/S1, redo decompression at bil L% roots, intertransverse arthrodesis L4-S1 with bone grafting and segmental fixation L4 to ileum.  PMX:  HTN    PT Comments    This was pt's first return to bed from the recliner.  Pain increased the more upright and forward he leaned to prep for the sit to stand.  Pain spiked during the transition to stand, but moderated once full upright.  Pivot was relatively benign, but once sitting EOB through the transition to sidelying, the pain spiked again.  Pt stayed up in the chair for approximately 2 hours, repositioned at least every 30 min.    Follow Up Recommendations  CIR;Supervision for mobility/OOB     Equipment Recommendations  Rolling walker with 5" wheels;3in1 (PT)    Recommendations for Other Services       Precautions / Restrictions Precautions Precautions: Back Precaution Comments: reviewed back precautions with pt Required Braces or Orthoses: Spinal Brace Spinal Brace: Applied in supine position;Lumbar corset    Mobility  Bed Mobility Overal bed mobility: Needs Assistance Bed Mobility: Sit to Sidelying Rolling:  (without heavy use of the rail)       Sit to sidelying: Max assist;Total assist;+2 for physical assistance General bed mobility comments: cues for technique, assist of legs and trunk throughout motion.  Pt much more pain transitioning sit to sidelying.  Transfers Overall transfer level: Needs assistance Equipment used:  (EVA) Transfers: Sit to/from [facility] Sit to Stand: Max assist (sit to stand from recliner) Stand pivot transfers: Min assist        General transfer comment: Pt had much more difficult time scooting to the edge of the chair and boosting to stand due to the pain in his R leg/hip with flexion.  Once up in the EVA walker pain moderated until sitting on the bed.  Ambulation/Gait     Assistive device:  (EVA walker)           Stairs             Wheelchair Mobility    Modified Rankin (Stroke Patients Only)       Balance Overall balance assessment: Needs assistance Sitting-balance support: Single extremity supported;Bilateral upper extremity supported Sitting balance-Leahy Scale: Poor Sitting balance - Comments: pt still unable to sit EOB without UE assist due to pain less than balance.   Standing balance support: Bilateral upper extremity supported;During functional activity Standing balance-Leahy Scale: Poor (to fair.  Unable to stand without UE support due to pain.)                              Cognition Arousal/Alertness: Awake/alert Behavior During Therapy: WFL for tasks assessed/performed Overall Cognitive Status: Within Functional Limits for tasks assessed                                 General Comments: Anxiety seemed improved today.  Pt able to work though and problem solve with a clearer head.      Exercises      General Comments  Pertinent Vitals/Pain Pain Assessment: 0-10 Faces Pain Scale: Hurts whole lot Pain Location: Rt hip Pain Intervention(s): Monitored during session;Limited activity within patient's tolerance    Home Living                      Prior Function            PT Goals (current goals can now be found in the care plan section) Acute Rehab PT Goals Patient Stated Goal: to return to work PT Goal Formulation: With patient Time For Goal Achievement: 10/12/20 Potential to Achieve Goals: Good Progress towards PT goals: Progressing toward goals    Frequency    Min 5X/week      PT Plan Current plan  remains appropriate    Co-evaluation              AM-PAC PT "6 Clicks" Mobility   Outcome Measure  Help needed turning from your back to your side while in a flat bed without using bedrails?: A Little Help needed moving from lying on your back to sitting on the side of a flat bed without using bedrails?: A Lot Help needed moving to and from a bed to a chair (including a wheelchair)?: A Lot Help needed standing up from a chair using your arms (e.g., wheelchair or bedside chair)?: A Little Help needed to walk in hospital room?: A Little Help needed climbing 3-5 steps with a railing? : A Lot 6 Click Score: 15    End of Session Equipment Utilized During Treatment: Back brace Activity Tolerance: Patient tolerated treatment well Patient left: in chair;with call bell/phone within reach;with chair alarm set Nurse Communication: Mobility status PT Visit Diagnosis: Other abnormalities of gait and mobility (R26.89);Pain Pain - Right/Left: Right Pain - part of body: Hip;Leg     Time: 3343-5686 PT Time Calculation (min) (ACUTE ONLY): 10 min  Charges:  $Therapeutic Activity: 8-22 mins                     10/06/2020  Alexander Carne., PT Acute Rehabilitation Services [phone]  (pager) [phone]  (office)   Alexander Rojas 10/06/2020, 6:22 PM

## 2020-10-07 NOTE — Progress Notes (Signed)
Physical Therapy Treatment Patient Details Name: Alexander Rojas MRN: [id number] DOB: 05-24-1956 Today's Date: 10/07/2020    History of Present Illness pt is a 64 y/o male admitte with failure of the anterior and posterior instrumentation after a L5-S1 360 fusion with resultant back and R leg radicular pain.  Pt s/p exploration and removal of instrumentation at L5/S1, redo decompression at bil L% roots, intertransverse arthrodesis L4-S1 with bone grafting and segmental fixation L4 to ileum.  PMX:  HTN    PT Comments    The pt was eager to participate in therapy session today, but was more limited by pain today than in other recent sessions. The pt was able to tolerate tilting to 74 degrees with no onset of dizziness and VSS, but then required modA of 2 to progress to standing and position in EVA walker from tilt bed. The pt was then limited in gait distance due to onset of significant pain in RLE, and only tolerated ~15 min standing while completing standing marches due to this pain. The pt will continue to benefit from skilled PT to progress functional activity tolerance and capacity for OOB mobility.     Follow Up Recommendations  CIR;Supervision for mobility/OOB     Equipment Recommendations  Rolling walker with 5" wheels;3in1 (PT)    Recommendations for Other Services       Precautions / Restrictions Precautions Precautions: Back Precaution Booklet Issued: No Precaution Comments: reviewed back precautions with pt Required Braces or Orthoses: Spinal Brace Spinal Brace: Applied in supine position;Lumbar corset Restrictions Weight Bearing Restrictions: No    Mobility  Bed Mobility Overal bed mobility: Needs Assistance Bed Mobility: Rolling Rolling: Min assist         General bed mobility comments: minA to complete movement, able to initiate and use LE without cues  Transfers Overall transfer level: Needs assistance Equipment used:  (eva walker) Transfers:  (stand from  tilt bed) Sit to Stand: From elevated surface;Mod assist (from tilt bed)         General transfer comment: pt stood from tilted bed, maxA of 2 to hold eva walker steady and support pt due to increased pain today  Ambulation/Gait Ambulation/Gait assistance: Mod assist Gait Distance (Feet): 5 Feet Assistive device:  (EVA walker) Gait Pattern/deviations: Step-to pattern;Trunk flexed Gait velocity: slow   General Gait Details: pt unable to complete standing marches at this time due to onset of significant pain, improved slightly with trunk extension, but pt falling into trunk flexion with fatigue until cued       Balance Overall balance assessment: Needs assistance         Standing balance support: Bilateral upper extremity supported;During functional activity Standing balance-Leahy Scale: Poor Standing balance comment: Able to progress from tilt of 75* to standing with EVA walker and up to min A and heavy support of UEs                            Cognition Arousal/Alertness: Awake/alert Behavior During Therapy: WFL for tasks assessed/performed Overall Cognitive Status: Within Functional Limits for tasks assessed                                        Exercises General Exercises - Lower Extremity Hip Flexion/Marching: AROM;Both;15 reps;Standing Other Exercises Other Exercises: mini squat with tilt bed in 60 deg    General  Comments General comments (skin integrity, edema, etc.): VSS, pt highly motivated but limited by pain      Pertinent Vitals/Pain Pain Assessment: 0-10 Pain Score: 8  Faces Pain Scale: Hurts whole lot Pain Location: Rt hip, posterior thigh, back Pain Descriptors / Indicators: Spasm;Sharp Pain Intervention(s): Limited activity within patient's tolerance;Monitored during session;Repositioned;Premedicated before session           PT Goals (current goals can now be found in the care plan section) Acute Rehab PT  Goals Patient Stated Goal: to return to work PT Goal Formulation: With patient Time For Goal Achievement: 10/12/20 Potential to Achieve Goals: Good Progress towards PT goals: Not progressing toward goals - comment (limited by increased pain today)    Frequency    Min 5X/week      PT Plan Current plan remains appropriate       AM-PAC PT "6 Clicks" Mobility   Outcome Measure  Help needed turning from your back to your side while in a flat bed without using bedrails?: A Little Help needed moving from lying on your back to sitting on the side of a flat bed without using bedrails?: A Lot Help needed moving to and from a bed to a chair (including a wheelchair)?: A Lot Help needed standing up from a chair using your arms (e.g., wheelchair or bedside chair)?: A Lot Help needed to walk in hospital room?: A Lot Help needed climbing 3-5 steps with a railing? : Total 6 Click Score: 12    End of Session Equipment Utilized During Treatment: Back brace Activity Tolerance: Patient limited by pain Patient left: in bed;with call bell/phone within reach;with nursing/sitter in room Nurse Communication: Mobility status PT Visit Diagnosis: Other abnormalities of gait and mobility (R26.89);Pain Pain - Right/Left: Right Pain - part of body: Hip;Leg     Time: 3845-3646 PT Time Calculation (min) (ACUTE ONLY): 57 min  Charges:  $Gait Training: 8-22 mins $Therapeutic Exercise: 8-22 mins $Therapeutic Activity: 23-37 mins                     Karma Ganja, PT, DPT   Acute Rehabilitation Department Pager #: [phone]   Otho Bellows 10/07/2020, 5:07 PM

## 2020-10-07 NOTE — Progress Notes (Signed)
Overall seems to be significantly better today.  Currently not in any pain.  He has been able to roll side to side in bed which he previously could not do.  Ambulated 200 feet yesterday.  No issues with his wounds.  He is afebrile.  Vital signs are stable.  Urine output is good.  He is awake and alert.  He is oriented and appropriate.  Motor and sensory intact.  Abdomen soft.  Progressing well.  Continue efforts at mobilization and pain control.

## 2020-10-08 NOTE — Progress Notes (Signed)
Neurosurgery Service Progress Note  Subjective: No acute events overnight, doing well from a pain perspective, no new complaints   Objective: Vitals:   10/07/20 2302 10/08/20 0317 10/08/20 0819 10/08/20 1231  BP: 105/65 101/71 102/70 95/70  Pulse: 82 69 72 88  Resp: 15 16 16 16   Temp: 99.4 F (37.4 C) 98.4 F (36.9 C) 98.6 F (37 C) 98.4 F (36.9 C)  TempSrc: Oral Oral Oral Oral  SpO2: 97%  92% 97%  Weight:      Height:        Physical Exam: Strength 5/5 x4, SILTx4  Assessment & Plan: 64 y.o. man s/p ALIF/PSF w/ revision, recovering well.  -cont current pain regimen -CIR transfer pending -SCDs/TEDs/enoxaparin   Judith Part  10/08/20 12:50 PM

## 2020-10-09 NOTE — Progress Notes (Signed)
Subjective: Patient reports no pain in bed, slept on side for 30 min at time last night, no NTW  Objective: Vital signs in last 24 hours: Temp:  [97.5 F (36.4 C)-98.7 F (37.1 C)] 98.4 F (36.9 C) (12/20 0721) Pulse Rate:  [65-88] 85 (12/20 0721) Resp:  [14-18] 14 (12/20 0721) BP: (95-106)/(64-73) 99/70 (12/20 0721) SpO2:  [90 %-100 %] 100 % (12/20 0721)  Intake/Output from previous day: 12/19 0701 - 12/20 0700 In: 123 [P.O.:120; I.V.:3] Out: 1000 [Urine:1000] Intake/Output this shift: Total I/O In: -  Out: 200 [Urine:200]  Neurologic: Grossly normal  Lab Results: Lab Results  Component Value Date   WBC 12.1 (H) 09/30/2020   HGB 11.9 (L) 09/30/2020   HCT 33.6 (L) 09/30/2020   MCV 98.5 09/30/2020   PLT 267 09/30/2020   Lab Results  Component Value Date   INR 1.0 09/15/2020   BMET Lab Results  Component Value Date   NA 138 09/30/2020   K 4.2 09/30/2020   CL 106 09/30/2020   CO2 24 09/30/2020   GLUCOSE 125 (H) 09/30/2020   BUN 20 09/30/2020   CREATININE 0.71 09/30/2020   CALCIUM 8.1 (L) 09/30/2020    Studies/Results: No results found.  Assessment/Plan: Overall stable, awaiting CIR  Estimated body mass index is 28.44 kg/m as calculated from the following:   Height as of this encounter: 5\' 11"  (1.803 m).   Weight as of this encounter: 92.5 kg.    LOS: 13 days    Eustace Moore 10/09/2020, 9:58 AM

## 2020-10-09 NOTE — Progress Notes (Signed)
Occupational Therapy Treatment Patient Details Name: Alexander Rojas MRN: [id number] DOB: [birth year] Today's Date: 10/09/2020    History of present illness pt is a 64 y/o male admitte with failure of the anterior and posterior instrumentation after a L5-S1 360 fusion with resultant back and R leg radicular pain.  Pt s/p exploration and removal of instrumentation at L5/S1, redo decompression at bil L% roots, intertransverse arthrodesis L4-S1 with bone grafting and segmental fixation L4 to ileum.  PMX:  HTN   OT comments  This 64 yo male admitted and underwent above presents to acute OT with being able to get OOB from side lying to sit (veruses foot exit) and able to tolerate standing at sink for grooming task. He will benefit from continued acute OT with follow up on CIR.  Follow Up Recommendations  Supervision/Assistance - 24 hour;CIR    Equipment Recommendations  3 in 1 bedside commode       Precautions / Restrictions Precautions Precautions: Back Precaution Comments: reviewed back precautions with pt Required Braces or Orthoses: Spinal Brace Spinal Brace: Applied in supine position;Lumbar corset Restrictions Weight Bearing Restrictions: No       Mobility Bed Mobility Overal bed mobility: Needs Assistance Bed Mobility: Rolling;Sidelying to Sit Rolling: Supervision Sidelying to sit: Mod assist       General bed mobility comments: Needed A for trunk  Transfers Overall transfer level: Needs assistance Equipment used: Rolling walker (2 wheeled)   Sit to Stand: Min assist;+2 physical assistance;From elevated surface              Balance Overall balance assessment: Needs assistance Sitting-balance support: No upper extremity supported Sitting balance-Leahy Scale: Fair     Standing balance support: Bilateral upper extremity supported;During functional activity Standing balance-Leahy Scale: Poor                             ADL either performed or  assessed with clinical judgement   ADL Overall ADL's : Needs assistance/impaired     Grooming: Oral care;Standing;Minimal assistance Grooming Details (indicate cue type and reason): at sink using [place] walker                                     Vision Patient Visual Report: No change from baseline            Cognition Arousal/Alertness: Awake/alert Behavior During Therapy: WFL for tasks assessed/performed Overall Cognitive Status: Within Functional Limits for tasks assessed                                                     Pertinent Vitals/ Pain       Pain Assessment: 0-10 Faces Pain Scale: Hurts even more Pain Location: back Pain Descriptors / Indicators: Aching;Sore Pain Intervention(s): Limited activity within patient's tolerance;Monitored during session;Repositioned         Frequency  Min 2X/week        Progress Toward Goals  OT Goals(current goals can now be found in the care plan section)  Progress towards OT goals: Progressing toward goals  Acute Rehab 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 Formulation: With patient/family Time For Goal Achievement: 10/14/20 Potential to Achieve  Goals: Good  Plan Discharge plan remains appropriate    Co-evaluation    PT/OT/SLP Co-Evaluation/Treatment: Yes (partial) Reason for Co-Treatment: For patient/therapist safety;To address functional/ADL transfers PT goals addressed during session: Mobility/safety with mobility;Balance;Strengthening/ROM OT goals addressed during session: Strengthening/ROM;ADL's and self-care      AM-PAC OT "6 Clicks" Daily Activity     Outcome Measure   Help from another person eating meals?: None Help from another person taking care of personal grooming?: A Little Help from another person toileting, which includes using toliet, bedpan, or urinal?: A Lot Help from another person bathing (including washing,  rinsing, drying)?: A Lot Help from another person to put on and taking off regular upper body clothing?: A Lot Help from another person to put on and taking off regular lower body clothing?: Total 6 Click Score: 14    End of Session Equipment Utilized During Treatment: Gait belt;Back brace  OT Visit Diagnosis: Pain Pain - part of body:  (back)   Activity Tolerance Patient tolerated treatment well   Patient Left  (with PT walking in hallway)           Time: 3953-2023 OT Time Calculation (min): 21 min  Charges: OT General Charges $OT Visit: 1 Visit OT Treatments $Self Care/Home Management : 8-22 mins  [address], OTR/L Acute Rehab Services Pager [phone] Office [phone]      Almon Register 10/09/2020, 3:24 PM

## 2020-10-09 NOTE — Progress Notes (Signed)
Inpatient Rehab Admissions Coordinator:   I have insurance authorization for CIR, however I do not have a bed available for pt to admit today.  Will continue to follow for timing of potential admit pending bed availability.   Shann Medal, PT, DPT Admissions Coordinator [phone] 10/09/20  11:24 AM

## 2020-10-09 NOTE — Progress Notes (Signed)
Physical Therapy Treatment Patient Details Name: Alexander Rojas MRN: [id number] DOB: Sep 06, 1956 Today's Date: 10/09/2020    History of Present Illness pt is a 64 y/o male admitte with failure of the anterior and posterior instrumentation after a L5-S1 360 fusion with resultant back and R leg radicular pain.  Pt s/p exploration and removal of instrumentation at L5/S1, redo decompression at bil L% roots, intertransverse arthrodesis L4-S1 with bone grafting and segmental fixation L4 to ileum.  PMX:  HTN    PT Comments    Pt feeling much better this pm.  Agrees to try rolling to get to EOB and exit from sitting.  Sit to stand into RW with change to EVA due to increased pain bearing weight from the regular RW.  Completed an ADL task at the sink, able to bear weight unilaterally and complete full task standing in the EVA.  Progressed gait stability in the EVA working to relax weight from UE's to add more weight through LE's.    Follow Up Recommendations  CIR;Supervision for mobility/OOB     Equipment Recommendations  Rolling walker with 5" wheels;3in1 (PT)    Recommendations for Other Services       Precautions / Restrictions Precautions Precautions: Back Precaution Comments: reviewed back precautions with pt Required Braces or Orthoses: Spinal Brace Spinal Brace: Applied in supine position;Lumbar corset Restrictions Weight Bearing Restrictions: No    Mobility  Bed Mobility Overal bed mobility: Needs Assistance Bed Mobility: Rolling;Sidelying to Sit Rolling: Supervision Sidelying to sit: Mod assist     Sit to sidelying: Max assist General bed mobility comments: Needed A to help bring trunk up and assist controlled descent and help getting legs into bed.  Transfers Overall transfer level: Needs assistance Equipment used: Rolling walker (2 wheeled)   Sit to Stand: Min assist;+2 physical assistance;From elevated surface Stand pivot transfers: Min assist       General  transfer comment: First time standing up into a RW, but pt unable to get comfortable place weight up through hands and asked to [place] RW to EVA.  Ambulation/Gait Ambulation/Gait assistance: Min assist Gait Distance (Feet): 120 Feet (then 130 and another 100 feet with standing rest propped against wall, against the sink.) Assistive device:  (EVA walker) Gait Pattern/deviations: Step-through pattern;Decreased stride length;Decreased stance time - right Gait velocity: slower Gait velocity interpretation: <1.8 ft/sec, indicate of risk for recurrent falls General Gait Details: more steady with the EVA walker, still moderately heavy weight bearing through his elbows, improving heel toe pattern.  Frequent stops to rest, arch back to relieve worsening pain/cramping.   Stairs             Wheelchair Mobility    Modified Rankin (Stroke Patients Only)       Balance Overall balance assessment: Needs assistance Sitting-balance support: No upper extremity supported Sitting balance-Leahy Scale: Fair (to poor.) Sitting balance - Comments: pt able use just 1 UE today for support and reach out for the AD without assist   Standing balance support: Bilateral upper extremity supported;During functional activity Standing balance-Leahy Scale: Poor                              Cognition Arousal/Alertness: Awake/alert Behavior During Therapy: WFL for tasks assessed/performed Overall Cognitive Status: Within Functional Limits for tasks assessed  Exercises      General Comments        Pertinent Vitals/Pain Pain Assessment: Faces Faces Pain Scale: Hurts even more Pain Location: back Pain Descriptors / Indicators: Aching;Sore Pain Intervention(s): Monitored during session;Limited activity within patient's tolerance;Repositioned    Home Living                      Prior Function            PT Goals (current  goals can now be found in the care plan section) Acute Rehab 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 Formulation: With patient Time For Goal Achievement: 10/12/20 Potential to Achieve Goals: Good Progress towards PT goals: Progressing toward goals    Frequency    Min 5X/week      PT Plan Current plan remains appropriate    Co-evaluation PT/OT/SLP Co-Evaluation/Treatment: Yes Reason for Co-Treatment: For patient/therapist safety PT goals addressed during session: Mobility/safety with mobility OT goals addressed during session: Strengthening/ROM;ADL's and self-care      AM-PAC PT "6 Clicks" Mobility   Outcome Measure  Help needed turning from your back to your side while in a flat bed without using bedrails?: A Little Help needed moving from lying on your back to sitting on the side of a flat bed without using bedrails?: A Little Help needed moving to and from a bed to a chair (including a wheelchair)?: A Lot Help needed standing up from a chair using your arms (e.g., wheelchair or bedside chair)?: A Little Help needed to walk in hospital room?: A Little Help needed climbing 3-5 steps with a railing? : Total 6 Click Score: 15    End of Session Equipment Utilized During Treatment: Back brace Activity Tolerance: Patient limited by pain Patient left: in bed;with call bell/phone within reach;with nursing/sitter in room Nurse Communication: Mobility status PT Visit Diagnosis: Other abnormalities of gait and mobility (R26.89);Pain Pain - Right/Left: Right Pain - part of body: Hip;Leg     Time: 1422-1450 PT Time Calculation (min) (ACUTE ONLY): 28 min  Charges:  $Gait Training: 8-22 mins                     10/09/2020  Alexander Rojas., PT Acute Rehabilitation Services [phone]  (pager) [phone]  (office)   Alexander Rojas 10/09/2020, 4:41 PM

## 2020-10-10 ENCOUNTER — Other Ambulatory Visit: Payer: Self-pay | Admitting: *Deleted

## 2020-10-10 ENCOUNTER — Inpatient Hospital Stay (HOSPITAL_COMMUNITY)
Admission: RE | Admit: 2020-10-10 | Discharge: 2020-10-27 | DRG: 560 | Disposition: A | Discharge status: Home Health | Payer: 59 | Source: Intra-hospital | Attending: Physical Medicine and Rehabilitation | Admitting: Physical Medicine and Rehabilitation

## 2020-10-10 ENCOUNTER — Other Ambulatory Visit: Payer: Self-pay

## 2020-10-10 DIAGNOSIS — G8918 Other acute postprocedural pain: Secondary | ICD-10-CM | POA: Diagnosis not present

## 2020-10-10 DIAGNOSIS — Z79899 Other long term (current) drug therapy: Secondary | ICD-10-CM

## 2020-10-10 DIAGNOSIS — K5903 Drug induced constipation: Secondary | ICD-10-CM | POA: Diagnosis not present

## 2020-10-10 DIAGNOSIS — Z79890 Hormone replacement therapy (postmenopausal): Secondary | ICD-10-CM

## 2020-10-10 DIAGNOSIS — R300 Dysuria: Secondary | ICD-10-CM | POA: Diagnosis not present

## 2020-10-10 DIAGNOSIS — B9689 Other specified bacterial agents as the cause of diseases classified elsewhere: Secondary | ICD-10-CM | POA: Diagnosis not present

## 2020-10-10 DIAGNOSIS — M792 Neuralgia and neuritis, unspecified: Secondary | ICD-10-CM | POA: Diagnosis not present

## 2020-10-10 DIAGNOSIS — M4805 Spinal stenosis, thoracolumbar region: Secondary | ICD-10-CM | POA: Diagnosis not present

## 2020-10-10 DIAGNOSIS — Z9889 Other specified postprocedural states: Secondary | ICD-10-CM | POA: Diagnosis not present

## 2020-10-10 DIAGNOSIS — D72829 Elevated white blood cell count, unspecified: Secondary | ICD-10-CM

## 2020-10-10 DIAGNOSIS — N390 Urinary tract infection, site not specified: Secondary | ICD-10-CM | POA: Diagnosis not present

## 2020-10-10 DIAGNOSIS — I82412 Acute embolism and thrombosis of left femoral vein: Secondary | ICD-10-CM | POA: Diagnosis present

## 2020-10-10 DIAGNOSIS — I10 Essential (primary) hypertension: Secondary | ICD-10-CM | POA: Diagnosis present

## 2020-10-10 DIAGNOSIS — N401 Enlarged prostate with lower urinary tract symptoms: Secondary | ICD-10-CM | POA: Diagnosis not present

## 2020-10-10 DIAGNOSIS — N400 Enlarged prostate without lower urinary tract symptoms: Secondary | ICD-10-CM | POA: Diagnosis present

## 2020-10-10 DIAGNOSIS — M419 Scoliosis, unspecified: Secondary | ICD-10-CM | POA: Diagnosis not present

## 2020-10-10 DIAGNOSIS — Z8679 Personal history of other diseases of the circulatory system: Secondary | ICD-10-CM

## 2020-10-10 DIAGNOSIS — M62838 Other muscle spasm: Secondary | ICD-10-CM | POA: Diagnosis not present

## 2020-10-10 DIAGNOSIS — E039 Hypothyroidism, unspecified: Secondary | ICD-10-CM | POA: Diagnosis present

## 2020-10-10 DIAGNOSIS — M47816 Spondylosis without myelopathy or radiculopathy, lumbar region: Secondary | ICD-10-CM | POA: Diagnosis not present

## 2020-10-10 DIAGNOSIS — Z4789 Encounter for other orthopedic aftercare: Secondary | ICD-10-CM | POA: Diagnosis not present

## 2020-10-10 DIAGNOSIS — M4317 Spondylolisthesis, lumbosacral region: Secondary | ICD-10-CM | POA: Diagnosis not present

## 2020-10-10 DIAGNOSIS — M5416 Radiculopathy, lumbar region: Secondary | ICD-10-CM | POA: Diagnosis not present

## 2020-10-10 DIAGNOSIS — T402X5A Adverse effect of other opioids, initial encounter: Secondary | ICD-10-CM

## 2020-10-10 DIAGNOSIS — R3911 Hesitancy of micturition: Secondary | ICD-10-CM | POA: Diagnosis not present

## 2020-10-10 DIAGNOSIS — M4326 Fusion of spine, lumbar region: Secondary | ICD-10-CM | POA: Diagnosis not present

## 2020-10-10 DIAGNOSIS — Z981 Arthrodesis status: Secondary | ICD-10-CM

## 2020-10-10 DIAGNOSIS — K5900 Constipation, unspecified: Secondary | ICD-10-CM | POA: Diagnosis present

## 2020-10-10 DIAGNOSIS — I82422 Acute embolism and thrombosis of left iliac vein: Secondary | ICD-10-CM | POA: Diagnosis present

## 2020-10-10 DIAGNOSIS — R609 Edema, unspecified: Secondary | ICD-10-CM | POA: Diagnosis not present

## 2020-10-10 LAB — CBC
HCT: 37 % — ABNORMAL LOW (ref 39.0–52.0)
Hemoglobin: 12.3 g/dL — ABNORMAL LOW (ref 13.0–17.0)
MCH: 33.1 pg (ref 26.0–34.0)
MCHC: 33.2 g/dL (ref 30.0–36.0)
MCV: 99.5 fL (ref 80.0–100.0)
Platelets: 169 10*3/uL (ref 150–400)
RBC: 3.72 MIL/uL — ABNORMAL LOW (ref 4.22–5.81)
RDW: 12.4 % (ref 11.5–15.5)
WBC: 10.3 10*3/uL (ref 4.0–10.5)
nRBC: 0 % (ref 0.0–0.2)

## 2020-10-10 LAB — CREATININE, SERUM
Creatinine, Ser: 0.85 mg/dL (ref 0.61–1.24)
GFR, Estimated: >60 mL/min (ref >60)

## 2020-10-10 MED ORDER — HYDROCHLOROTHIAZIDE 12.5 MG PO CAPS
12.5000 mg | ORAL_CAPSULE | Freq: Every day | ORAL | Status: DC
  Administered 2020-10-11 – 2020-10-14 (×4): 12.5 mg via ORAL
  Filled 2020-10-10 (×6): qty 1

## 2020-10-10 MED ORDER — ENOXAPARIN SODIUM 30 MG/0.3ML ~~LOC~~ SOLN
30.0000 mg | SUBCUTANEOUS | Status: DC
  Administered 2020-10-11: 30 mg via SUBCUTANEOUS
  Filled 2020-10-10: qty 0.3

## 2020-10-10 MED ORDER — BISACODYL 5 MG PO TBEC: 5.0000 mg | DELAYED_RELEASE_TABLET | Freq: Every day | ORAL | Status: DC | PRN

## 2020-10-10 MED ORDER — NALOXEGOL OXALATE 12.5 MG PO TABS
12.5000 mg | ORAL_TABLET | Freq: Every day | ORAL | Status: DC
  Administered 2020-10-11 – 2020-10-16 (×6): 12.5 mg via ORAL
  Filled 2020-10-10 (×6): qty 1

## 2020-10-10 MED ORDER — ACETAMINOPHEN 650 MG RE SUPP: 650.0000 mg | RECTAL | Status: DC | PRN

## 2020-10-10 MED ORDER — HYDROMORPHONE HCL 1 MG/ML IJ SOLN: 1.0000 mg | INTRAMUSCULAR | Status: DC | PRN

## 2020-10-10 MED ORDER — DOCUSATE SODIUM 100 MG PO CAPS
100.0000 mg | ORAL_CAPSULE | Freq: Two times a day (BID) | ORAL | Status: DC
  Administered 2020-10-10 – 2020-10-26 (×33): 100 mg via ORAL
  Filled 2020-10-10 (×35): qty 1

## 2020-10-10 MED ORDER — TAPENTADOL HCL 50 MG PO TABS
75.0000 mg | ORAL_TABLET | Freq: Four times a day (QID) | ORAL | Status: DC
Start: 2020-10-10 — End: 2020-10-10

## 2020-10-10 MED ORDER — ASCORBIC ACID 500 MG PO TABS
500.0000 mg | ORAL_TABLET | Freq: Two times a day (BID) | ORAL | Status: DC
  Administered 2020-10-10 – 2020-10-27 (×34): 500 mg via ORAL
  Filled 2020-10-10 (×35): qty 1

## 2020-10-10 MED ORDER — LEVOTHYROXINE SODIUM 100 MCG PO TABS
100.0000 ug | ORAL_TABLET | Freq: Every day | ORAL | Status: DC
  Administered 2020-10-11 – 2020-10-27 (×17): 100 ug via ORAL
  Filled 2020-10-10: qty 4
  Filled 2020-10-10 (×16): qty 1

## 2020-10-10 MED ORDER — VITAMIN A 3 MG (10000 UNIT) PO CAPS
10000.0000 [IU] | ORAL_CAPSULE | Freq: Every day | ORAL | Status: DC
  Administered 2020-10-11 – 2020-10-27 (×17): 10000 [IU] via ORAL
  Filled 2020-10-10 (×17): qty 1

## 2020-10-10 MED ORDER — TAPENTADOL HCL 50 MG PO TABS: 75.0000 mg | ORAL_TABLET | Freq: Four times a day (QID) | ORAL | Status: DC | PRN

## 2020-10-10 MED ORDER — CALCIUM CARBONATE-VITAMIN D 500-200 MG-UNIT PO TABS
1.0000 | ORAL_TABLET | Freq: Every day | ORAL | Status: DC
  Administered 2020-10-11 – 2020-10-27 (×17): 1 via ORAL
  Filled 2020-10-10 (×17): qty 1

## 2020-10-10 MED ORDER — ENOXAPARIN SODIUM 30 MG/0.3ML ~~LOC~~ SOLN: 30.0000 mg | Freq: Two times a day (BID) | SUBCUTANEOUS | Status: DC

## 2020-10-10 MED ORDER — ACETAMINOPHEN 325 MG PO TABS
650.0000 mg | ORAL_TABLET | ORAL | Status: DC | PRN
  Administered 2020-10-16 – 2020-10-25 (×13): 650 mg via ORAL
  Filled 2020-10-10 (×16): qty 2

## 2020-10-10 MED ORDER — METHOCARBAMOL 500 MG PO TABS
500.0000 mg | ORAL_TABLET | Freq: Four times a day (QID) | ORAL | Status: DC | PRN
  Administered 2020-10-11 – 2020-10-17 (×4): 500 mg via ORAL
  Filled 2020-10-10 (×4): qty 1

## 2020-10-10 MED ORDER — GABAPENTIN 400 MG PO CAPS
400.0000 mg | ORAL_CAPSULE | Freq: Three times a day (TID) | ORAL | Status: DC
  Administered 2020-10-10 – 2020-10-11 (×2): 400 mg via ORAL
  Filled 2020-10-10 (×2): qty 1

## 2020-10-10 MED ORDER — SENNA 8.6 MG PO TABS
1.0000 | ORAL_TABLET | Freq: Two times a day (BID) | ORAL | Status: DC
  Administered 2020-10-10 – 2020-10-26 (×31): 8.6 mg via ORAL
  Filled 2020-10-10 (×35): qty 1

## 2020-10-10 MED ORDER — LOSARTAN POTASSIUM 50 MG PO TABS
50.0000 mg | ORAL_TABLET | Freq: Every day | ORAL | Status: DC
  Administered 2020-10-11 – 2020-10-14 (×4): 50 mg via ORAL
  Filled 2020-10-10 (×6): qty 1

## 2020-10-10 MED ORDER — POLYETHYLENE GLYCOL 3350 17 G PO PACK: 17.0000 g | PACK | Freq: Every day | ORAL | Status: DC | PRN

## 2020-10-10 NOTE — Discharge Summary (Signed)
Physician Discharge Summary  Patient ID: Alexander Rojas MRN: [id number] DOB/AGE: February 02, 1956 64 y.o.  Admit date: 09/26/2020 Discharge date: 10/10/2020  Admission Diagnoses: Status post lumbar fusion with loss of anterior column fixation.  Lumbar radiculopathy.  Discharge Diagnoses: Status post lumbar fusion with loss of anterior column fixation.  Lumbar radiculopathy. 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  He subsequently underwent posterior fixation to the ileum for stabilization.  Radicular symptoms gradually improved.  He will however require significant inpatient rehabilitation and is being transferred there.  Consults: None  Significant Diagnostic Studies: None  Treatments: surgery: Posterior revision surgery with fixation to the ileum  Discharge Exam: Blood pressure 97/74, pulse 78, temperature 98.5 F (36.9 C), resp. rate 20, height 5\' 11"  (1.803 m), weight 92.5 kg, SpO2 91 %. Incisions are clean and dry motor function is intact in the lower extremities with mild weakness in the gastrocs bilaterally at 4 out of 5.  Disposition: Discharge disposition: [place] Not Defined       Discharge Instructions    Diet - low sodium heart healthy   Complete by: As directed    Increase activity slowly   Complete by: As directed    No wound care   Complete by: As directed      Allergies as of 10/10/2020   No Known Allergies     Medication List    TAKE these medications   gabapentin 300 MG capsule Commonly known as: NEURONTIN Take 300 mg by mouth 3 (three) times daily.   hydrochlorothiazide 12.5 MG capsule Commonly known as: MICROZIDE Take 12.5 mg  by mouth daily.   levothyroxine 100 MCG tablet Commonly known as: SYNTHROID Take 100 mcg by mouth daily before breakfast.   losartan 50 MG tablet Commonly known as: COZAAR Take 50 mg by mouth daily.   methocarbamol 500 MG tablet Commonly known as: ROBAXIN Take 1 tablet (500 mg total) by mouth every 6 (six) hours as needed for muscle spasms.   methylPREDNISolone 4 MG Tbpk tablet Commonly known as: MEDROL DOSEPAK Take 4 mg by mouth as directed. 6 day dose pack   ondansetron 4 MG tablet Commonly known as: ZOFRAN Take 1 tablet (4 mg total) by mouth every 6 (six) hours as needed for nausea or vomiting.   pantoprazole 40 MG tablet Commonly known as: PROTONIX Take 40 mg by mouth daily.   tapentadol HCl 75 MG tablet Commonly known as: NUCYNTA Take 1 tablet (75 mg total) by mouth every 6 (six) hours as needed for moderate pain.   Nucynta 100 MG Tabs Generic drug: Tapentadol HCl Take 100 mg by mouth every 4 (four) hours as needed for pain.            Durable Medical Equipment  (From admission, onward)         Start     Ordered   09/28/20 0012  DME Walker rolling  Once       Question:  Patient needs a walker to treat with the following condition  Answer:  S/P lumbar fusion   09/28/20 0011   09/28/20 0012  DME 3 n 1  Once        09/28/20 0011  Signed: Earleen Newport 10/10/2020, 9:19 AM

## 2020-10-10 NOTE — H&P (Signed)
Physical Medicine and Rehabilitation Admission H&P     HPI: Alexander Rojas is a 63 year old right-handed male with history of hypertension, hypothyroidism, macrocytosis, BPH, chronic back pain maintained on Nucynta with recent decompressive lumbar laminectomy hemifacetectomy and foraminotomies L5-S1 with anterior lumbar interbody fusion L5-S1 posterior fixation L5-S1 09/19/2020 per Dr. Sherley Bounds for lytic spondylolisthesis with spinal stenosis, foraminal stenosis and discharged to home.  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  1 level home with level entry.  Presented 09/26/2020 with complaints of severe right leg pain with movement onset of symptoms over the past few days rapidly worsening since that time.  X-rays and imaging revealed failure of anterior and posterior instrumentation after L5-S1 fusion.  Patient underwent lumbar reexploration with removal of instrumentation L5-S1 repeat decompression of both L5 nerve roots intertransverse arthrodesis L4-S1 bilaterally with segmental fixation L4 to the ileum bilaterally 09/27/2020 per Dr. Sherley Bounds.  Back brace as directed applied in sitting position.  He was cleared to begin Lovenox for DVT prophylaxis.  Most recent CBC 09/30/2020 hemoglobin 11.9 WBC 12,100.  Due to patient's decreased overall functional mobility weakness lower extremities he was admitted for a comprehensive rehab program.  Review of Systems  Constitutional: Negative for chills and fever.  HENT: Negative for hearing loss.   Eyes: Negative for blurred vision and double vision.  Respiratory: Negative for cough and shortness of breath.   Cardiovascular: Negative for chest pain, palpitations and leg swelling.  Gastrointestinal: Positive for constipation. Negative for heartburn, nausea and vomiting.  Genitourinary: Positive for urgency. Negative for dysuria, flank pain and hematuria.   Musculoskeletal: Positive for back pain and myalgias.  Skin: Negative for rash.  Neurological: Positive for sensory change and weakness.  All other systems reviewed and are negative.  Past Medical History:  Diagnosis Date  . Back pain   . BPH (benign prostatic hyperplasia)   . Extensor tendon laceration, finger, open wound, initial encounter    left thumb  . Hypertension   . Hypothyroidism   . Macrocytosis   . Numbness and tingling   . Pars defect with spondylolisthesis    Past Surgical History:  Procedure Laterality Date  . ABDOMINAL EXPOSURE N/A 09/19/2020   Procedure: ABDOMINAL EXPOSURE;  Surgeon: Rosetta Posner, MD;  Location: [facility] OR;  Service: Vascular;  Laterality: N/A;  anterior  . ANTERIOR LUMBAR FUSION N/A 09/19/2020   Procedure: Anterior Lumbar Interbody Fusion Lumbar Five-Sacral One, posterior lateral fusion with gill decompression Lumbar Five-Sacral One.;  Surgeon: Eustace Moore, MD;  Location: [place];  Service: Neurosurgery;  Laterality: N/A;  anterior  . APPLICATION OF INTRAOPERATIVE CT SCAN N/A 09/27/2020   Procedure: APPLICATION OF INTRAOPERATIVE CT SCAN;  Surgeon: Eustace Moore, MD;  Location: [place];  Service: Neurosurgery;  Laterality: N/A;  . COLONOSCOPY WITH PROPOFOL N/A 12/18/2018   Procedure: COLONOSCOPY WITH BIOPSY;  Surgeon: Lucilla Lame, MD;  Location: [place];  Service: Endoscopy;  Laterality: N/A;  NEEDS TO STAY FIRST  . ESOPHAGOGASTRODUODENOSCOPY    . ESOPHAGOGASTRODUODENOSCOPY (EGD) WITH PROPOFOL N/A 02/23/2019   Procedure: ESOPHAGOGASTRODUODENOSCOPY (EGD) WITH PROPOFOL;  Surgeon: Lucilla Lame, MD;  Location: [facility] END[facility]COPY;  Service: Endoscopy;  Laterality: N/A;  . HERNIA REPAIR    . LAMINECTOMY WITH POSTERIOR LATERAL ARTHRODESIS LEVEL 1 N/A 09/19/2020   Procedure: LAMINECTOMY WITH POSTERIOR LATERAL ARTHRODESIS LEVEL 1;  Surgeon: Eustace Moore, MD;  Location: [place];  Service: Neurosurgery;  Laterality: N/A;  posterior  . LAMINECTOMY WITH  POSTERIOR LATERAL ARTHRODESIS LEVEL 2 N/A 09/27/2020   Procedure: Lumbar exploration with Extension of Posterior Instrumentation Lumbar Four to the [place] with Airo;  Surgeon: Eustace Moore, MD;  Location: [place];  Service: Neurosurgery;  Laterality: N/A;  . LIPOMA EXCISION Right    upper arm  . POLYPECTOMY N/A 12/18/2018   Procedure: POLYPECTOMY;  Surgeon: Lucilla Lame, MD;  Location: [place];  Service: Endoscopy;  Laterality: N/A;  . TENDON REPAIR Left 11/05/2019   Procedure: LEFT THUMB TENDON REPAIR;  Surgeon: Cindra Presume, MD;  Location: [place];  Service: Plastics;  Laterality: Left;  Axillary block, MAC  . UMBILICAL HERNIA REPAIR     History reviewed. No pertinent family history. Social History:  reports that he has never smoked. He has never used smokeless tobacco. He reports previous alcohol use. He reports that he does not use drugs. Allergies: No Known Allergies Medications Prior to Admission  Medication Sig Dispense Refill  . gabapentin (NEURONTIN) 300 MG capsule Take 300 mg by mouth 3 (three) times daily.    . hydrochlorothiazide (MICROZIDE) 12.5 MG capsule Take 12.5 mg by mouth daily.    Marland Kitchen levothyroxine (SYNTHROID) 100 MCG tablet Take 100 mcg by mouth daily before breakfast.     . losartan (COZAAR) 50 MG tablet Take 50 mg by mouth daily.     . methocarbamol (ROBAXIN) 500 MG tablet Take 1 tablet (500 mg total) by mouth every 6 (six) hours as needed for muscle spasms. 60 tablet 1  . methylPREDNISolone (MEDROL DOSEPAK) 4 MG TBPK tablet Take 4 mg by mouth as directed. 6 day dose pack    . NUCYNTA 100 MG TABS Take 100 mg by mouth every 4 (four) hours as needed for pain.    Marland Kitchen ondansetron (ZOFRAN) 4 MG tablet Take 1 tablet (4 mg total) by mouth every 6 (six) hours as needed for nausea or vomiting. 20 tablet 0  . pantoprazole (PROTONIX) 40 MG tablet Take 40 mg by mouth daily.     . tapentadol (NUCYNTA) 75 MG tablet Take 1 tablet (75 mg total) by mouth every 6  (six) hours as needed for moderate pain. (Patient not taking: Reported on 09/26/2020) 30 tablet 0    Drug Regimen Review Drug regimen was reviewed and remains appropriate with no significant issues identified  Home: Home Living Family/patient expects to be discharged to:: Private residence Living Arrangements: Spouse/significant other Available Help at Discharge: Family Type of Home: House Home Access: Level entry Home Layout: Multi-level,Bed/bath upstairs Alternate Level Stairs-Number of Steps: flights Bathroom Shower/Tub: Multimedia programmer: Associate Professor Accessibility: Yes Home Equipment: None   Functional History: Prior Function Level of Independence: Independent Comments: 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 Transfers Overall transfer level: Needs assistance Equipment used: Rolling walker (2 wheeled) Transfers:  (stand from tilt bed) Sit to Stand: Min assist,+2 physical assistance,From elevated surface Stand pivot transfers: Min assist General transfer comment: First time standing up into a RW, but pt unable to get comfortable place weight up through hands and asked to [place] RW to EVA. Ambulation/Gait Ambulation/Gait assistance: Min assist Gait  Distance (Feet): 120 Feet (then 130 and another 100 feet with standing rest propped against wall, against the sink.) Assistive device:  (EVA walker) Gait Pattern/deviations: Step-through pattern,Decreased stride length,Decreased stance time - right General Gait Details: more steady with the EVA walker, still moderately heavy weight bearing through his  elbows, improving heel toe pattern.  Frequent stops to rest, arch back to relieve worsening pain/cramping. Gait velocity: slower Gait velocity interpretation: <1.8 ft/sec, indicate of risk for recurrent falls    ADL: ADL Overall ADL's : Needs assistance/impaired Eating/Feeding: Set up,Bed level Grooming: Oral care,Standing,Minimal assistance Grooming Details (indicate cue type and reason): at sink using Eva walker Upper Body Bathing: Minimal assistance,Bed level Lower Body Bathing: Maximal assistance,Bed level Upper Body Dressing : Maximal assistance,Bed level Lower Body Dressing: Total assistance,Bed level Toilet Transfer: Total assistance Toilet Transfer Details (indicate cue type and reason): unable to safely attempt today Toileting- Clothing Manipulation and Hygiene: Total assistance,Bed level Functional mobility during ADLs: Minimal assistance,Min guard (bed level)  Cognition: Cognition Overall Cognitive Status: Within Functional Limits for tasks assessed Orientation Level: Oriented X4 Cognition Arousal/Alertness: Awake/alert Behavior During Therapy: WFL for tasks assessed/performed Overall Cognitive Status: Within Functional Limits for tasks assessed General Comments: Anxiety seemed improved today.  Pt able to work though and problem solve with a clearer head.  Physical Exam: Blood pressure 102/68, pulse 72, temperature 98.5 F (36.9 C), temperature source Oral, resp. rate 18, height _0  (1.803 m), weight 92.5 kg, SpO2 95 %. Physical Exam Constitutional:      General: He is not in acute distress.    Appearance: Normal appearance. He is normal weight.  HENT:     Head: Normocephalic and atraumatic.     Right Ear: External ear normal.     Left Ear: External ear normal.     Nose: Nose normal. No congestion.     Mouth/Throat:     Mouth: Mucous membranes are moist.     Pharynx: Oropharynx is clear.  Eyes:     Extraocular Movements: Extraocular movements intact.      Conjunctiva/sclera: Conjunctivae normal.     Pupils: Pupils are equal, round, and reactive to light.  Cardiovascular:     Rate and Rhythm: Normal rate and regular rhythm.     Pulses: Normal pulses.     Heart sounds: No murmur heard. No gallop.   Pulmonary:     Effort: Pulmonary effort is normal. No respiratory distress.     Breath sounds: Normal breath sounds. No wheezing.  Abdominal:     General: Bowel sounds are normal. There is no distension.     Palpations: Abdomen is soft.     Tenderness: There is no abdominal tenderness.  Musculoskeletal:     Cervical back: Normal range of motion.     Comments: Tender along right buttock and low back with palpation and with RLE lift in bed.  Skin:    Comments: Back incision is clean and dry, essentially healed/closed  Neurological:     Mental Status: He is alert.     Comments: Patient is alert oriented x3 and cooperative with exam.  Follows full commands. UE 5/5. RLE 3+/5 HF (pain), 4+ KE and KF, ADF 4/5, APF 5/5. LLE: 4-/5 HF, 4+/5 KE, KF, ADF/APF. Mild sensory loss along anterior right foot extending to anterolateral leg  Psychiatric:        Mood and Affect: Mood normal.        Behavior: Behavior normal.     No results found for  this or any previous visit (from the past 48 hour(s)). No results found.     Medical Problem List and Plan: 1.  Lower extremity weakness right greater than left with numbness and decrease in sensation secondary to failure of anterior and posterior instrumentation L5-S1 fusion from 09/19/2020.  Status post lumbar reexploration and removal of instrumentation L5-S1 repeat decompression both L5 nerve roots with segmental fixation L4 09/27/2020.  Pt demonstrates residual right L5 radiculopathy on exam.  -Back brace as directed  -patient may  shower  -ELOS/Goals: 7-10 days, mod I goals with PT and OT 2.  Antithrombotics: -DVT/anticoagulation: Subcutaneous Lovenox.  Check vascular study  -antiplatelet therapy: N/A 3.  Pain Management: Nucynta 75-100 mg every 6 hours prn, Robaxin 500 mg every 6 hours as needed Neurontin 400 mg 3 times daily, tylenol prn.   -pt wants to wean off nucynta 4. Mood: Provide emotional support  -antipsychotic agents: N/A 5. Neuropsych: This patient is capable of making decisions on his own behalf. 6. Skin/Wound Care: Routine skin checks 7. Fluids/Electrolytes/Nutrition: Routine in and outs with follow-up chemistries on admit 8.  Hypertension.  HCTZ 12.5 mg daily, Cozaar 50 mg daily.  Monitor with increased mobility 9.  Hypothyroidism.  Synthroid 10.  Constipation.  Colace twice daily, Movantik 12.5 mg daily, MiraLAX daily as needed  -moved bowels last 12/19 11.  BPH.  Check PVR's    Cathlyn Parsons, PA-C 10/10/2020

## 2020-10-10 NOTE — Patient Outreach (Addendum)
[place] [facility]) Care Management  10/10/2020  Alexander Rojas 05-20-1956 [id number]   Care Coordination   Transition of care   Referral received:09/19/20 Initial outreach:09/22/20 Insurance: [employer]   Patient with readmission 09/25/20 at [facility], and remains inpatient.    Plan  Will close case and notify Hospital liaison , that will follow for disposition plans.   Joylene Draft, RN, BSN  [place] Management Coordinator  [phone]- Mobile [phone]- Toll Free Main Office

## 2020-10-10 NOTE — Progress Notes (Signed)
Inpatient Rehab Admissions Coordinator:   I have a bed available for pt to admit to CIR today. Meghan (NP with neurosugery) in agreement.  Will let pt/family and TOC team know.   Shann Medal, PT, DPT Admissions Coordinator [phone] 10/10/20  9:57 AM

## 2020-10-10 NOTE — Progress Notes (Signed)
PMR Admission Coordinator Pre-Admission Assessment   Patient: Alexander Rojas is an 64 y.o., male MRN: [id number] DOB: [birth year] Height: _0  (180.3 cm) Weight: 92.5 kg   Insurance Information HMO:     PPO:      PCP:      IPA:      80/20: yes     OTHER:  PRIMARY: [employer]      Policy#: [id number]      Subscriber: pt CM Name: faxed approval      Phone#:      Fax#: [phone] Pre-Cert#:   [id number] auth via faxed approval with updates due on 12/27 to fax listed above    Employer: [employer] Benefits:  Phone #: [phone]     Name:  Eff. Date: 10/22/19     Deduct: $0      Out of Pocket Max: $4000 (met)      Life Max: n/a CIR: 80%      SNF: 80% Outpatient:80%    Co-Pay: 20% Home Health: 80%      Co-Pay: 20% DME: 80%     Co-Pay: 20% Providers: [facility] preferred, otherwise 60%/40% co-insurance SECONDARY:       Policy#:      Phone#:    Development worker, community:       Phone#:    The Engineer, petroleum" for patients in Inpatient Rehabilitation Facilities with attached "Privacy Act [place] Records" was provided and verbally reviewed with: N/A   Emergency Contact Information         Contact Information     Name Relation Home Work [place] Spouse [phone]   [phone]         Current Medical History  Patient Admitting Diagnosis: debility following failed L5-S1 decompression with re-do L4-S1 fixation    History of Present Illness: Alexander Rojas is a 64 year old right-handed male with history of hypertension, hypothyroidism, macrocytosis, BPH, chronic back pain maintained on Nucynta with recent decompressive lumbar laminectomy hemifacetectomy and foraminotomies L5-S1 with anterior lumbar interbody fusion L5-S1 posterior fixation L5-S1 09/19/2020 per Dr. Sherley Bounds for lytic spondylolisthesis with spinal stenosis, foraminal stenosis and discharged to home.   Presented 09/26/2020 with complaints of severe right leg pain with movement onset of symptoms  over the past few days rapidly worsening since that time.  X-rays and imaging revealed failure of anterior and posterior instrumentation after L5-S1 fusion.  Patient underwent lumbar reexploration with removal of instrumentation L5-S1 repeat decompression of both L5 nerve roots intertransverse arthrodesis L4-S1 bilaterally with segmental fixation L4 to the ileum bilaterally 09/27/2020 per Dr. Sherley Bounds.  Back brace as directed applied in sitting position.  He was cleared to begin Lovenox for DVT prophylaxis.  Most recent CBC 09/30/2020 hemoglobin 11.9 WBC 12,100.  Due to patient's decreased overall functional mobility, weakness lower extremities, and severe pain, he was recommended for a comprehensive rehab program.    Patient's medical record from [name] has been reviewed by the rehabilitation admission coordinator and physician.   Past Medical History      Past Medical History:  Diagnosis Date  . Back pain    . BPH (benign prostatic hyperplasia)    . Extensor tendon laceration, finger, open wound, initial encounter      left thumb  . Hypertension    . Hypothyroidism    . Macrocytosis    . Numbness and tingling    . Pars defect with spondylolisthesis        Family History  family history is not on file.   Prior Rehab/Hospitalizations Has the patient had prior rehab or hospitalizations prior to admission? Yes   Has the patient had major surgery during 100 days prior to admission? Yes              Current Medications   Current Facility-Administered Medications:  .  0.9 %  sodium chloride infusion, 250 mL, Intravenous, Continuous, Eustace Moore, MD .  acetaminophen (TYLENOL) tablet 650 mg, 650 mg, Oral, Q4H PRN, 650 mg at 10/09/20 2132 **OR** acetaminophen (TYLENOL) suppository 650 mg, 650 mg, Rectal, Q4H PRN, Eustace Moore, MD .  ascorbic acid (VITAMIN C) tablet 500 mg, 500 mg, Oral, BID, Eustace Moore, MD, 500 mg at 10/10/20 0855 .  bisacodyl (DULCOLAX) EC tablet 5 mg, 5 mg,  Oral, Daily PRN, Eustace Moore, MD, 5 mg at 10/04/20 0951 .  calcium-vitamin D (OSCAL WITH D) 500-200 MG-UNIT per tablet 1 tablet, 1 tablet, Oral, Daily, Eustace Moore, MD, 1 tablet at 10/10/20 [phone] .  Chlorhexidine Gluconate Cloth 2 % PADS 6 each, 6 each, Topical, Daily, Eustace Moore, MD, 6 each at 10/10/20 [phone] .  diazepam (VALIUM) injection 1-2 mg, 1-2 mg, Intravenous, Q1H PRN, Meyran, Ocie Cornfield, NP, 2 mg at 10/02/20 2058 .  docusate sodium (COLACE) capsule 100 mg, 100 mg, Oral, BID, Eustace Moore, MD, 100 mg at 10/08/20 0925 .  enoxaparin (LOVENOX) injection 30 mg, 30 mg, Subcutaneous, Q24H, Meyran, Ocie Cornfield, NP, 30 mg at 10/10/20 0855 .  gabapentin (NEURONTIN) capsule 400 mg, 400 mg, Oral, TID, Reece Agar, MD, 400 mg at 10/10/20 0855 .  hydrochlorothiazide (MICROZIDE) capsule 12.5 mg, 12.5 mg, Oral, Daily, Eustace Moore, MD, 12.5 mg at 10/08/20 0926 .  HYDROmorphone (DILAUDID) injection 1-2 mg, 1-2 mg, Intravenous, Q2H PRN, Eustace Moore, MD .  levothyroxine (SYNTHROID) tablet 100 mcg, 100 mcg, Oral, QAC breakfast, Eustace Moore, MD, 100 mcg at 10/10/20 0615 .  losartan (COZAAR) tablet 50 mg, 50 mg, Oral, Daily, Eustace Moore, MD, 50 mg at 10/08/20 0926 .  magnesium hydroxide (MILK OF MAGNESIA) suspension 15 mL, 15 mL, Oral, Daily PRN, Eustace Moore, MD .  menthol-cetylpyridinium (CEPACOL) lozenge 3 mg, 1 lozenge, Oral, PRN **OR** phenol (CHLORASEPTIC) mouth spray 1 spray, 1 spray, Mouth/Throat, PRN, Eustace Moore, MD .  naloxegol oxalate (MOVANTIK) tablet 12.5 mg, 12.5 mg, Oral, Daily, Eustace Moore, MD, 12.5 mg at 10/08/20 0926 .  polyethylene glycol (MIRALAX / GLYCOLAX) packet 17 g, 17 g, Oral, Daily PRN, Eustace Moore, MD, 17 g at 10/08/20 0925 .  senna (SENOKOT) tablet 8.6 mg, 1 tablet, Oral, BID, Eustace Moore, MD, 8.6 mg at 10/08/20 0926 .  sodium chloride flush (NS) 0.9 % injection 3 mL, 3 mL, Intravenous, Q12H, Eustace Moore, MD, 3 mL at 10/10/20 0905 .   sodium chloride flush (NS) 0.9 % injection 3 mL, 3 mL, Intravenous, PRN, Eustace Moore, MD .  sodium phosphate (FLEET) 7-19 GM/118ML enema 1 enema, 1 enema, Rectal, Daily PRN, Meyran, Ocie Cornfield, NP, 1 enema at 10/08/20 1353 .  tapentadol (NUCYNTA) tablet 75-100 mg, 75-100 mg, Oral, Q6H PRN, Kristeen Miss, MD .  vitamin A capsule 10,000 Units, 10,000 Units, Oral, Daily, Eustace Moore, MD, 10,000 Units at 10/10/20 8882   Patients Current Diet:     Diet Order  back precautions with pt Spinal Brace: Applied in supine position,Lumbar corset Restrictions Weight Bearing Restrictions: No   Has the patient had 2 or more falls or a fall with injury in the past year? No  Prior Activity Level Community (5-7x/wk): independent, working as a cardiac surgeron and teaching, driving, no DME used at baseline  Prior Functional Level Self Care: Did the patient need help bathing, dressing, using the toilet or eating? Independent  Indoor Mobility: Did the patient need assistance with walking from room to room (with or without device)? Independent  Stairs: Did the patient need assistance with internal or external stairs (with or without device)? Independent  Functional Cognition: Did the patient need help planning regular tasks such as shopping or remembering to take medications? Independent  Home Assistive Devices / Equipment Home Assistive Devices/Equipment: Eyeglasses Home Equipment: None  Prior Device Use: Indicate devices/aids used by the patient prior to current illness, exacerbation or injury? RW after surgery, otherwise, none  Current Functional Level Cognition  Overall Cognitive Status: Within Functional Limits for tasks assessed Orientation Level:  Oriented X4 General Comments: Anxiety seemed improved today.  Pt able to work though and problem solve with a clearer head.    Extremity Assessment (includes Sensation/Coordination)  Upper Extremity Assessment: Generalized weakness  Lower Extremity Assessment: Defer to PT evaluation RLE Deficits / Details: weakness due to pain RLE: Unable to fully assess due to pain    ADLs  Overall ADL's : Needs assistance/impaired Eating/Feeding: Set up,Bed level Grooming: Oral care,Standing,Minimal assistance Grooming Details (indicate cue type and reason): at sink using Eva walker Upper Body Bathing: Minimal assistance,Bed level Lower Body Bathing: Maximal assistance,Bed level Upper Body Dressing : Maximal assistance,Bed level Lower Body Dressing: Total assistance,Bed level Toilet Transfer: Total assistance Toilet Transfer Details (indicate cue type and reason): unable to safely attempt today Toileting- Clothing Manipulation and Hygiene: Total assistance,Bed level Functional mobility during ADLs: Minimal assistance,Min guard (bed level)    Mobility  Overal bed mobility: Needs Assistance Bed Mobility: Rolling,Sidelying to Sit Rolling: Supervision Sidelying to sit: Mod assist Sit to sidelying: Max assist General bed mobility comments: Needed A to help bring trunk up and assist controlled descent and help getting legs into bed.    Transfers  Overall transfer level: Needs assistance Equipment used: Rolling walker (2 wheeled) Transfers:  (stand from tilt bed) Sit to Stand: Min assist,+2 physical assistance,From elevated surface Stand pivot transfers: Min assist General transfer comment: First time standing up into a RW, but pt unable to get comfortable place weight up through hands and asked to swith RW to EVA.    Ambulation / Gait / Stairs / Wheelchair Mobility  Ambulation/Gait Ambulation/Gait assistance: Min assist Gait Distance (Feet): 120 Feet (then 130 and another 100 feet with standing  rest propped against wall, against the sink.) Assistive device:  (EVA walker) Gait Pattern/deviations: Step-through pattern,Decreased stride length,Decreased stance time - right General Gait Details: more steady with the EVA walker, still moderately heavy weight bearing through his elbows, improving heel toe pattern.  Frequent stops to rest, arch back to relieve worsening pain/cramping. Gait velocity: slower Gait velocity interpretation: <1.8 ft/sec, indicate of risk for recurrent falls    Posture / Balance Dynamic Sitting Balance Sitting balance - Comments: pt able use just 1 UE today for support and reach out for the AD without assist Balance Overall balance assessment: Needs assistance Sitting-balance support: No upper extremity supported Sitting balance-Leahy Scale: Fair (to poor.) Sitting balance - Comments: pt able use just 1 UE   toe pattern.  Frequent stops to rest, arch back to relieve worsening pain/cramping. Gait velocity: slower Gait velocity interpretation: <1.8 ft/sec, indicate of risk for recurrent falls     Posture / Balance Dynamic Sitting Balance Sitting balance - Comments: pt able use just 1 UE today for support and reach out for the AD without assist Balance Overall balance assessment: Needs assistance Sitting-balance support: No upper extremity supported Sitting balance-Leahy Scale: Fair (to poor.) Sitting balance - Comments: pt able use just 1 UE today for support and reach out for the AD without assist Standing balance support: Bilateral upper extremity supported,During functional activity Standing balance-Leahy Scale: Poor Standing balance comment: Able to progress from tilt of 75* to standing with EVA walker and up to min A and heavy support of UEs     Special needs/care consideration n/a    Previous Home Environment (from acute therapy documentation) Living Arrangements: Spouse/significant other Available Help at Discharge: Family Type of Home: House Home Layout: Multi-level,Bed/bath upstairs Alternate Level Stairs-Number of Steps: flights Home Access: Level entry Bathroom Shower/Tub: Multimedia programmer: Associate Professor Accessibility: Yes Home Care Services: No   Discharge Living Setting Plans for Discharge Living Setting: Patient's home Type of Home at Discharge: House Discharge Home Layout: Multi-level,Bed/bath upstairs Alternate Level Stairs-Number of Steps: full  flight Discharge Home Access: Level entry Discharge Bathroom Shower/Tub: Walk-in shower Discharge Bathroom Toilet: Standard Discharge Bathroom Accessibility: Yes How Accessible: Accessible via walker Does the patient have any problems obtaining your medications?: No   Social/Family/Support Systems Patient Roles: Spouse,Other (Comment) Sales promotion account executive) Anticipated Caregiver: Oletta Lamas (wife) Anticipated Caregiver's Contact Information: [phone] Ability/Limitations of Caregiver: works [employer] Caregiver Availability: Evenings only Discharge Plan Discussed with Primary Caregiver: Yes Is Caregiver In Agreement with Plan?: Yes Does Caregiver/Family have Issues with Lodging/Transportation while Pt is in Rehab?: No   Goals Patient/Family Goal for Rehab: PT/OT intermittent Mod I Expected length of stay: 7-10 days Additional Information: has resources for hiring caregivers if needed, though not anticipated Pt/Family Agrees to Admission and willing to participate: Yes Program Orientation Provided & Reviewed with Pt/Caregiver Including Roles  & Responsibilities: Yes  Barriers to Discharge: Decreased caregiver support   Decrease burden of Care through IP rehab admission: n/a   Possible need for SNF placement upon discharge: Not anticipated   Patient Condition: I have reviewed medical records from [facility], spoken with CM, and patient. I met with patient at the bedside for inpatient rehabilitation assessment.  Patient will benefit from ongoing PT and OT, can actively participate in 3 hours of therapy a day 5 days of the week, and can make measurable gains during the admission.  Patient will also benefit from the coordinated team approach during an Inpatient Acute Rehabilitation admission.  The patient will receive intensive therapy as well as Rehabilitation physician, nursing, social worker, and care management interventions.  Due to safety, skin/wound care, medication administration, pain  management and patient education the patient requires 24 hour a day rehabilitation nursing.  The patient is currently min to max with mobility and basic ADLs.  Discharge setting and therapy post discharge at home with home health is anticipated.  Patient has agreed to participate in the Acute Inpatient Rehabilitation Program and will admit today.   Preadmission Screen Completed By:  Michel Santee, 10/10/2020 10:22 AM ______________________________________________________________________   Discussed status with Dr. Naaman Plummer on 10/10/20  at 10:35 AM and received approval for admission today.   Admission Coordinator:  Michel Santee, PT, DPT time 10:36 AM /  Date  10/10/20     Assessment/Plan: Diagnosis: debility after Lumbar fusion failure/re-do 1. Does the need for close, 24 hr/day Medical supervision in concert with the patient's rehab needs make it unreasonable for this patient to be served in a less intensive setting? Yes 2. Co-Morbidities requiring supervision/potential complications: BPH, pain mgt, wound care 3. Due to bladder management, bowel management, safety, skin/wound care, disease management, medication administration, pain management and patient education, does the patient require 24 hr/day rehab nursing? Yes 4. Does the patient require coordinated care of a physician, rehab nurse, PT, OT, and SLP to address physical and functional deficits in the context of the above medical diagnosis(es)? Yes Addressing deficits in the following areas: balance, endurance, locomotion, strength, transferring, bowel/bladder control, bathing, dressing, feeding, grooming, toileting and psychosocial support 5. Can the patient actively participate in an intensive therapy program of at least 3 hrs of therapy 5 days a week? Yes 6. The potential for patient to make measurable gains while on inpatient rehab is excellent 7. Anticipated functional outcomes upon discharge from inpatient rehab: modified independent  PT, modified independent OT, n/a SLP 8. Estimated rehab length of stay to reach the above functional goals is: 7-10 days 9. Anticipated discharge destination: Home 10. Overall Rehab/Functional Prognosis: excellent     MD Signature: Meredith Staggers, MD, [facility] 10/10/2020

## 2020-10-10 NOTE — Progress Notes (Signed)
Pt admitted to room 4M12. Oriented to floor, call bell and rehab fall policy.  Denies any pain or discomfort at this time. Pt currently resting in bed with all need within reach.   Gerald Stabs, RN

## 2020-10-10 NOTE — PMR Pre-admission (Signed)
PMR Admission Coordinator Pre-Admission Assessment  Patient: Alexander Rojas is an 64 y.o., male MRN: [id number] DOB: [birth year] Height: _0  (180.3 cm) Weight: 92.5 kg  Insurance Information HMO:     PPO:      PCP:      IPA:      80/20: yes     OTHER:  PRIMARY: UMR      Policy#: [id number]      Subscriber: pt CM Name: faxed approval      Phone#:      Fax#: [phone] Pre-Cert#:   [id number] auth via faxed approval with updates due on 12/27 to fax listed above    Employer: [employer]:  Phone #: [phone]     Name:  Eff. Date: 10/22/19     Deduct: $0      Out of Pocket Max: $4000 (met)      Life Max: n/a CIR: 80%      SNF: 80% Outpatient:80%    Co-Pay: 20% Home Health: 80%      Co-Pay: 20% DME: 80%     Co-Pay: 20% Providers: [facility] preferred, otherwise 60%/40% co-insurance SECONDARY:       Policy#:      Phone#:   Development worker, community:       Phone#:   The Engineer, petroleum" for patients in Inpatient Rehabilitation Facilities with attached "Privacy Act [place] Records" was provided and verbally reviewed with: N/A  Emergency Contact Information Contact Information    Name Relation Home Work [place] Spouse [phone]  [phone]      Current Medical History  Patient Admitting Diagnosis: debility following failed L5-S1 decompression with re-do L4-S1 fixation   History of Present Illness: Alexander Rojas is a 64 year old right-handed male with history of hypertension, hypothyroidism, macrocytosis, BPH, chronic back pain maintained on Nucynta with recent decompressive lumbar laminectomy hemifacetectomy and foraminotomies L5-S1 with anterior lumbar interbody fusion L5-S1 posterior fixation L5-S1 09/19/2020 per Dr. Sherley Bounds for lytic spondylolisthesis with spinal stenosis, foraminal stenosis and discharged to home.   Presented 09/26/2020 with complaints of severe right leg pain with movement onset of symptoms over the past few  days rapidly worsening since that time.  X-rays and imaging revealed failure of anterior and posterior instrumentation after L5-S1 fusion.  Patient underwent lumbar reexploration with removal of instrumentation L5-S1 repeat decompression of both L5 nerve roots intertransverse arthrodesis L4-S1 bilaterally with segmental fixation L4 to the ileum bilaterally 09/27/2020 per Dr. Sherley Bounds.  Back brace as directed applied in sitting position.  He was cleared to begin Lovenox for DVT prophylaxis.  Most recent CBC 09/30/2020 hemoglobin 11.9 WBC 12,100.  Due to patient's decreased overall functional mobility, weakness lower extremities, and severe pain, he was recommended for a comprehensive rehab program.     Patient's medical record from [name] has been reviewed by the rehabilitation admission coordinator and physician.  Past Medical History  Past Medical History:  Diagnosis Date  . Back pain   . BPH (benign prostatic hyperplasia)   . Extensor tendon laceration, finger, open wound, initial encounter    left thumb  . Hypertension   . Hypothyroidism   . Macrocytosis   . Numbness and tingling   . Pars defect with spondylolisthesis     Family History   family history is not on file.  Prior Rehab/Hospitalizations Has the patient had prior rehab or hospitalizations prior to admission? Yes  Has the patient had major surgery during 100 days prior to admission?  Yes   Current Medications  Current Facility-Administered Medications:  .  0.9 %  sodium chloride infusion, 250 mL, Intravenous, Continuous, Eustace Moore, MD .  acetaminophen (TYLENOL) tablet 650 mg, 650 mg, Oral, Q4H PRN, 650 mg at 10/09/20 2132 **OR** acetaminophen (TYLENOL) suppository 650 mg, 650 mg, Rectal, Q4H PRN, Eustace Moore, MD .  ascorbic acid (VITAMIN C) tablet 500 mg, 500 mg, Oral, BID, Eustace Moore, MD, 500 mg at 10/10/20 0855 .  bisacodyl (DULCOLAX) EC tablet 5 mg, 5 mg, Oral, Daily PRN, Eustace Moore, MD, 5 mg at  10/04/20 0951 .  calcium-vitamin D (OSCAL WITH D) 500-200 MG-UNIT per tablet 1 tablet, 1 tablet, Oral, Daily, Eustace Moore, MD, 1 tablet at 10/10/20 [phone] .  Chlorhexidine Gluconate Cloth 2 % PADS 6 each, 6 each, Topical, Daily, Eustace Moore, MD, 6 each at 10/10/20 [id number] .  diazepam (VALIUM) injection 1-2 mg, 1-2 mg, Intravenous, Q1H PRN, Meyran, Ocie Cornfield, NP, 2 mg at 10/02/20 2058 .  docusate sodium (COLACE) capsule 100 mg, 100 mg, Oral, BID, Eustace Moore, MD, 100 mg at 10/08/20 0925 .  enoxaparin (LOVENOX) injection 30 mg, 30 mg, Subcutaneous, Q24H, Meyran, Ocie Cornfield, NP, 30 mg at 10/10/20 0855 .  gabapentin (NEURONTIN) capsule 400 mg, 400 mg, Oral, TID, Reece Agar, MD, 400 mg at 10/10/20 0855 .  hydrochlorothiazide (MICROZIDE) capsule 12.5 mg, 12.5 mg, Oral, Daily, Eustace Moore, MD, 12.5 mg at 10/08/20 0926 .  HYDROmorphone (DILAUDID) injection 1-2 mg, 1-2 mg, Intravenous, Q2H PRN, Eustace Moore, MD .  levothyroxine (SYNTHROID) tablet 100 mcg, 100 mcg, Oral, QAC breakfast, Eustace Moore, MD, 100 mcg at 10/10/20 0615 .  losartan (COZAAR) tablet 50 mg, 50 mg, Oral, Daily, Eustace Moore, MD, 50 mg at 10/08/20 0926 .  magnesium hydroxide (MILK OF MAGNESIA) suspension 15 mL, 15 mL, Oral, Daily PRN, Eustace Moore, MD .  menthol-cetylpyridinium (CEPACOL) lozenge 3 mg, 1 lozenge, Oral, PRN **OR** phenol (CHLORASEPTIC) mouth spray 1 spray, 1 spray, Mouth/Throat, PRN, Eustace Moore, MD .  naloxegol oxalate (MOVANTIK) tablet 12.5 mg, 12.5 mg, Oral, Daily, Eustace Moore, MD, 12.5 mg at 10/08/20 0926 .  polyethylene glycol (MIRALAX / GLYCOLAX) packet 17 g, 17 g, Oral, Daily PRN, Eustace Moore, MD, 17 g at 10/08/20 0925 .  senna (SENOKOT) tablet 8.6 mg, 1 tablet, Oral, BID, Eustace Moore, MD, 8.6 mg at 10/08/20 0926 .  sodium chloride flush (NS) 0.9 % injection 3 mL, 3 mL, Intravenous, Q12H, Eustace Moore, MD, 3 mL at 10/10/20 0905 .  sodium chloride flush (NS) 0.9 % injection  3 mL, 3 mL, Intravenous, PRN, Eustace Moore, MD .  sodium phosphate (FLEET) 7-19 GM/118ML enema 1 enema, 1 enema, Rectal, Daily PRN, Meyran, Ocie Cornfield, NP, 1 enema at 10/08/20 1353 .  tapentadol (NUCYNTA) tablet 75-100 mg, 75-100 mg, Oral, Q6H PRN, Kristeen Miss, MD .  vitamin A capsule 10,000 Units, 10,000 Units, Oral, Daily, Eustace Moore, MD, 10,000 Units at 10/10/20 9470  Patients Current Diet:  Diet Order            Diet - low sodium heart healthy           Diet regular Room service appropriate? Yes; Fluid consistency: Thin  Diet effective now                 Precautions / Restrictions Precautions Precautions: Back Precaution Booklet Issued: No Precaution Comments: reviewed  back precautions with pt Spinal Brace: Applied in supine position,Lumbar corset Restrictions Weight Bearing Restrictions: No   Has the patient had 2 or more falls or a fall with injury in the past year? No  Prior Activity Level Community (5-7x/wk): independent, working as a Catering manager, driving, no DME used at baseline  Prior Functional Level Self Care: Did the patient need help bathing, dressing, using the toilet or eating? Independent  Indoor Mobility: Did the patient need assistance with walking from room to room (with or without device)? Independent  Stairs: Did the patient need assistance with internal or external stairs (with or without device)? Independent  Functional Cognition: Did the patient need help planning regular tasks such as shopping or remembering to take medications? Independent  Home Assistive Devices / Equipment Home Assistive Devices/Equipment: Eyeglasses Home Equipment: None  Prior Device Use: Indicate devices/aids used by the patient prior to current illness, exacerbation or injury? RW after surgery, otherwise, none  Current Functional Level Cognition  Overall Cognitive Status: Within Functional Limits for tasks assessed Orientation Level:  Oriented X4 General Comments: Anxiety seemed improved today.  Pt able to work though and problem solve with a clearer head.    Extremity Assessment (includes Sensation/Coordination)  Upper Extremity Assessment: Generalized weakness  Lower Extremity Assessment: Defer to PT evaluation RLE Deficits / Details: weakness due to pain RLE: Unable to fully assess due to pain    ADLs  Overall ADL's : Needs assistance/impaired Eating/Feeding: Set up,Bed level Grooming: Oral care,Standing,Minimal assistance Grooming Details (indicate cue type and reason): at sink using Eva walker Upper Body Bathing: Minimal assistance,Bed level Lower Body Bathing: Maximal assistance,Bed level Upper Body Dressing : Maximal assistance,Bed level Lower Body Dressing: Total assistance,Bed level Toilet Transfer: Total assistance Toilet Transfer Details (indicate cue type and reason): unable to safely attempt today Toileting- Clothing Manipulation and Hygiene: Total assistance,Bed level Functional mobility during ADLs: Minimal assistance,Min guard (bed level)    Mobility  Overal bed mobility: Needs Assistance Bed Mobility: Rolling,Sidelying to Sit Rolling: Supervision Sidelying to sit: Mod assist Sit to sidelying: Max assist General bed mobility comments: Needed A to help bring trunk up and assist controlled descent and help getting legs into bed.    Transfers  Overall transfer level: Needs assistance Equipment used: Rolling walker (2 wheeled) Transfers:  (stand from tilt bed) Sit to Stand: Min assist,+2 physical assistance,From elevated surface Stand pivot transfers: Min assist General transfer comment: First time standing up into a RW, but pt unable to get comfortable place weight up through hands and asked to [place] RW to EVA.    Ambulation / Gait / Stairs / Wheelchair Mobility  Ambulation/Gait Ambulation/Gait assistance: Herbalist (Feet): 120 Feet (then 130 and another 100 feet with standing  rest propped against wall, against the sink.) Assistive device:  (EVA walker) Gait Pattern/deviations: Step-through pattern,Decreased stride length,Decreased stance time - right General Gait Details: more steady with the EVA walker, still moderately heavy weight bearing through his elbows, improving heel toe pattern.  Frequent stops to rest, arch back to relieve worsening pain/cramping. Gait velocity: slower Gait velocity interpretation: <1.8 ft/sec, indicate of risk for recurrent falls    Posture / Balance Dynamic Sitting Balance Sitting balance - Comments: pt able use just 1 UE today for support and reach out for the AD without assist Balance Overall balance assessment: Needs assistance Sitting-balance support: No upper extremity supported Sitting balance-Leahy Scale: Fair (to poor.) Sitting balance - Comments: pt able use just 1 UE  today for support and reach out for the AD without assist Standing balance support: Bilateral upper extremity supported,During functional activity Standing balance-Leahy Scale: Poor Standing balance comment: Able to progress from tilt of 75* to standing with EVA walker and up to min A and heavy support of UEs    Special needs/care consideration n/a   Previous Home Environment (from acute therapy documentation) Living Arrangements: Spouse/significant other Available Help at Discharge: Family Type of Home: House Home Layout: Multi-level,Bed/bath upstairs Alternate Level Stairs-Number of Steps: flights Home Access: Level entry Bathroom Shower/Tub: Multimedia programmer: Associate Professor Accessibility: Yes Home Care Services: No  Discharge Living Setting Plans for Discharge Living Setting: Patient's home Type of Home at Discharge: House Discharge Home Layout: Multi-level,Bed/bath upstairs Alternate Level Stairs-Number of Steps: full flight Discharge Home Access: Level entry Discharge Bathroom Shower/Tub: Walk-in shower Discharge Bathroom  Toilet: Standard Discharge Bathroom Accessibility: Yes How Accessible: Accessible via walker Does the patient have any problems obtaining your medications?: No  Social/Family/Support Systems Patient Roles: Spouse,Other (Comment) Sales promotion account executive) Anticipated Caregiver: Oletta Lamas (wife) Anticipated Caregiver's Contact Information: [phone] Ability/Limitations of Caregiver: works [employer] Caregiver Availability: Evenings only Discharge Plan Discussed with Primary Caregiver: Yes Is Caregiver In Agreement with Plan?: Yes Does Caregiver/Family have Issues with Lodging/Transportation while Pt is in Rehab?: No  Goals Patient/Family Goal for Rehab: PT/OT intermittent Mod I Expected length of stay: 7-10 days Additional Information: has resources for hiring caregivers if needed, though not anticipated Pt/Family Agrees to Admission and willing to participate: Yes Program Orientation Provided & Reviewed with Pt/Caregiver Including Roles  & Responsibilities: Yes  Barriers to Discharge: Decreased caregiver support  Decrease burden of Care through IP rehab admission: n/a  Possible need for SNF placement upon discharge: Not anticipated  Patient Condition: I have reviewed medical records from [facility], spoken with CM, and patient. I met with patient at the bedside for inpatient rehabilitation assessment.  Patient will benefit from ongoing PT and OT, can actively participate in 3 hours of therapy a day 5 days of the week, and can make measurable gains during the admission.  Patient will also benefit from the coordinated team approach during an Inpatient Acute Rehabilitation admission.  The patient will receive intensive therapy as well as Rehabilitation physician, nursing, social worker, and care management interventions.  Due to safety, skin/wound care, medication administration, pain management and patient education the patient requires 24 hour a day rehabilitation nursing.  The patient is currently min  to max with mobility and basic ADLs.  Discharge setting and therapy post discharge at home with home health is anticipated.  Patient has agreed to participate in the Acute Inpatient Rehabilitation Program and will admit today.  Preadmission Screen Completed By:  Michel Santee, 10/10/2020 10:22 AM ______________________________________________________________________   Discussed status with Dr. Naaman Plummer on 10/10/20  at 10:35 AM and received approval for admission today.  Admission Coordinator:  Michel Santee, PT, DPT time 10:36 AM Sudie Grumbling  10/10/20    Assessment/Plan: Diagnosis: debility after Lumbar fusion failure/re-do 1. Does the need for close, 24 hr/day Medical supervision in concert with the patient's rehab needs make it unreasonable for this patient to be served in a less intensive setting? Yes 2. Co-Morbidities requiring supervision/potential complications: BPH, pain mgt, wound care 3. Due to bladder management, bowel management, safety, skin/wound care, disease management, medication administration, pain management and patient education, does the patient require 24 hr/day rehab nursing? Yes 4. Does the patient require coordinated care of a physician, rehab nurse, PT,  OT, and SLP to address physical and functional deficits in the context of the above medical diagnosis(es)? Yes Addressing deficits in the following areas: balance, endurance, locomotion, strength, transferring, bowel/bladder control, bathing, dressing, feeding, grooming, toileting and psychosocial support 5. Can the patient actively participate in an intensive therapy program of at least 3 hrs of therapy 5 days a week? Yes 6. The potential for patient to make measurable gains while on inpatient rehab is excellent 7. Anticipated functional outcomes upon discharge from inpatient rehab: modified independent PT, modified independent OT, n/a SLP 8. Estimated rehab length of stay to reach the above functional goals is: 7-10  days 9. Anticipated discharge destination: Home 10. Overall Rehab/Functional Prognosis: excellent   MD Signature: Meredith Staggers, MD, [facility] 10/10/2020

## 2020-10-10 NOTE — Progress Notes (Signed)
Inpatient Rehabilitation Medication Review by a Pharmacist  A complete drug regimen review was completed for this patient to identify any potential clinically significant medication issues.  Clinically significant medication issues were identified:  no  Check AMION for pharmacist assigned to patient if future medication questions/issues arise during this admission.  Pharmacist comments: Patient was on Pantoprazole 40mg  daily PTA.  Rehab team to resume as needed.  Time spent performing this drug regimen review (minutes):  10   [name], Alexander Crafts 10/10/2020 5:45 PM

## 2020-10-10 NOTE — TOC Transition Note (Signed)
Transition of Care (TOC) - CM/SW Discharge Note Marvetta Gibbons RN,BSN Transitions of Care Unit 4NP (non trauma) - RN Case Manager See Treatment Team for direct Phone #   Patient Details  Name: Alexander Rojas MRN: [id number] Date of Birth: 1955-11-17  Transition of Care [facility]) CM/SW Contact:  Dawayne Patricia, RN Phone Number: 10/10/2020, 11:56 AM   Clinical Narrative:    Notified by Leretha Dykes with [facility] IN[facility] rehab- that pt has received insurance auth and has bed available today, this Probation officer met with pt at bedside to inform, pt states he is ready for rehab and accepts bed. Pt stable for transition to rehab, D/C order in place. Pt to transfer later today over to [facility] rehab unit.   Final next level of care: IP Rehab Facility Barriers to Discharge: Barriers Resolved   Patient Goals and CMS Choice Patient states their goals for this hospitalization and ongoing recovery are:: rehab and return home CMS Medicare.gov Compare Post Acute Care list provided to:: Patient Choice offered to / list presented to : Patient  Discharge Placement                 [facility]      Discharge Plan and Services   Discharge Planning Services: CM Consult Post Acute Care Choice: IP Rehab          DME Arranged: N/A DME Agency: NA       HH Arranged: NA HH Agency: NA        Social Determinants of Health (SDOH) Interventions     Readmission Risk Interventions Readmission Risk Prevention Plan 10/10/2020  Post Dischage Appt Complete  Medication Screening Complete  Transportation Screening Complete  Some recent data might be hidden

## 2020-10-10 NOTE — Progress Notes (Signed)
Patient ID: Alexander Rojas, male   DOB: [birth year], 64 y.o.   MRN: [id number] Patient is awake alert notes that yesterday was a good day He is eager to get transferred to rehab possibly today He wishes to back off of his Nucynta and we discussed changing it to 75 to 100 mg every 6 hours instead of the 100 mg that it is at now.  Hopeful for discharge to rehab

## 2020-10-11 ENCOUNTER — Inpatient Hospital Stay (HOSPITAL_COMMUNITY): Payer: 59

## 2020-10-11 ENCOUNTER — Encounter (HOSPITAL_COMMUNITY): Payer: Self-pay | Admitting: Physical Medicine and Rehabilitation

## 2020-10-11 ENCOUNTER — Inpatient Hospital Stay (HOSPITAL_COMMUNITY): Payer: 59 | Admitting: Occupational Therapy

## 2020-10-11 DIAGNOSIS — R609 Edema, unspecified: Secondary | ICD-10-CM

## 2020-10-11 LAB — CBC WITH DIFFERENTIAL/PLATELET
Abs Immature Granulocytes: 0.06 10*3/uL (ref 0.00–0.07)
Basophils Absolute: 0.1 10*3/uL (ref 0.0–0.1)
Basophils Relative: 1 %
Eosinophils Absolute: 0.2 10*3/uL (ref 0.0–0.5)
Eosinophils Relative: 2 %
HCT: 35.8 % — ABNORMAL LOW (ref 39.0–52.0)
Hemoglobin: 12.8 g/dL — ABNORMAL LOW (ref 13.0–17.0)
Immature Granulocytes: 1 %
Lymphocytes Relative: 13 %
Lymphs Abs: 1.4 10*3/uL (ref 0.7–4.0)
MCH: 34.5 pg — ABNORMAL HIGH (ref 26.0–34.0)
MCHC: 35.8 g/dL (ref 30.0–36.0)
MCV: 96.5 fL (ref 80.0–100.0)
Monocytes Absolute: 0.8 10*3/uL (ref 0.1–1.0)
Monocytes Relative: 8 %
Neutro Abs: 8 10*3/uL — ABNORMAL HIGH (ref 1.7–7.7)
Neutrophils Relative %: 75 %
Platelets: 179 10*3/uL (ref 150–400)
RBC: 3.71 MIL/uL — ABNORMAL LOW (ref 4.22–5.81)
RDW: 12.5 % (ref 11.5–15.5)
WBC: 10.6 10*3/uL — ABNORMAL HIGH (ref 4.0–10.5)
nRBC: 0 % (ref 0.0–0.2)

## 2020-10-11 LAB — COMPREHENSIVE METABOLIC PANEL
ALT: 32 U/L (ref 0–44)
AST: 25 U/L (ref 15–41)
Albumin: 2.6 g/dL — ABNORMAL LOW (ref 3.5–5.0)
Alkaline Phosphatase: 62 U/L (ref 38–126)
Anion gap: 12 (ref 5–15)
BUN: 12 mg/dL (ref 8–23)
CO2: 23 mmol/L (ref 22–32)
Calcium: 8.4 mg/dL — ABNORMAL LOW (ref 8.9–10.3)
Chloride: 100 mmol/L (ref 98–111)
Creatinine, Ser: 0.8 mg/dL (ref 0.61–1.24)
GFR, Estimated: >60 mL/min (ref >60)
Glucose, Bld: 119 mg/dL — ABNORMAL HIGH (ref 70–99)
Potassium: 4 mmol/L (ref 3.5–5.1)
Sodium: 135 mmol/L (ref 135–145)
Total Bilirubin: 0.9 mg/dL (ref 0.3–1.2)
Total Protein: 5.8 g/dL — ABNORMAL LOW (ref 6.5–8.1)

## 2020-10-11 MED ORDER — TAPENTADOL HCL 50 MG PO TABS: 75.0000 mg | ORAL_TABLET | Freq: Three times a day (TID) | ORAL | Status: DC

## 2020-10-11 MED ORDER — RIVAROXABAN 20 MG PO TABS: 20.0000 mg | ORAL_TABLET | Freq: Every day | ORAL | Status: DC

## 2020-10-11 MED ORDER — TAPENTADOL HCL 50 MG PO TABS
100.0000 mg | ORAL_TABLET | Freq: Three times a day (TID) | ORAL | Status: DC
  Administered 2020-10-11 – 2020-10-18 (×27): 100 mg via ORAL
  Filled 2020-10-11 (×29): qty 2

## 2020-10-11 MED ORDER — GABAPENTIN 100 MG PO CAPS
200.0000 mg | ORAL_CAPSULE | Freq: Three times a day (TID) | ORAL | Status: DC
  Administered 2020-10-11 – 2020-10-16 (×15): 200 mg via ORAL
  Filled 2020-10-11 (×16): qty 2

## 2020-10-11 MED ORDER — RIVAROXABAN 15 MG PO TABS
15.0000 mg | ORAL_TABLET | Freq: Two times a day (BID) | ORAL | Status: DC
  Administered 2020-10-11 – 2020-10-27 (×32): 15 mg via ORAL
  Filled 2020-10-11 (×32): qty 1

## 2020-10-11 NOTE — Progress Notes (Signed)
Physical Therapy Session Note  Patient Details  Name: Alexander Rojas MRN: [id number] Date of Birth: [birth year]  Today's Date: 10/11/2020 PT Individual Time: 1300-1410 PT Individual Time Calculation (min): 70 min   Short Term Goals: Week 1:  PT Short Term Goal 1 (Week 1): pt will move supine>< sit with min assist PT Short Term Goal 2 (Week 1): pt will move sit>< stand with min assist PT Short Term Goal 3 (Week 1): pt will initiate gait  Skilled Therapeutic Interventions/Progress Updates:  Pt resting in bed; LO still donned.  Pt rated pain 1/10 low back, at rest.  He did not know if he had had pain meds, but knew that he had had muscle relaxant.  PT verified with his RN that he had had pain meds as well.  PT urged pt to request that pain meds be noted on his white board so that he can monitor them himself.    Therapeutic exercises performed with LE to increase strength for functional mobility: supine in hook lying- 20 x 1each-  bil hip adductor squeezes with small excursion pelvic tilt within pain-free range; bil hip abduction/adduction.  In supine- 25 x 1 alternating ankle pumps.   Pt willing to try getting OOB.  Log rolling R in flat bed with supervision.  Mod/max assist to sit up.  Pt 's pain in sitting more manageable this tx than this AM.  Pt rated 3/10 in sitting, R buttock.  PT demonstrated use of STedy to pt, and brought tilt in space w/c into room.  Sit> stand with Stedy, mod assist.  Pt able to wt bear through RLE, and stated that standing is very comfortable.  Wt shifting in standing, L><R with CGA, heavy bil UE support on Stedy.  Stand> sit in w/c, mod  assist, with cushion and 2 pillows in seat to raise height; cues for exhaling during this transitional movement.  Pt did not find tilted -back position with bil LEs elevated comfortable.  Various positions tried in tilt in space w/c, with nearly upright with bil LEs lowered, the most comfortable.  Pt gripping bil armrests very tightly  throughout session, with pain 7/10 R buttock.  PT coached pt on deep breathing, and attempting to relax UEs somewhat.  PT drove pt around unit for him to visualize all therapy rooms, simulated car, etc.  Pt declined getting out of w/c to attempt further mobility/locomotion at this time.  Use of Stedy as above to return to bed; max assist for sit> supine with cues for exhaling during movement.  At end of session, pt resting in supine, LO still donned, needs at hand and bed alarm set.   Therapy Documentation Precautions:  Precautions Precautions: Back Precaution Comments: reviewed back precautions Required Braces or Orthoses: Spinal Brace Spinal Brace: Applied in supine position,Lumbar corset Restrictions Weight Bearing Restrictions: No        Therapy/Group: Individual Therapy  Izzabell Klasen 10/11/2020, 4:39 PM

## 2020-10-11 NOTE — Evaluation (Signed)
Occupational Therapy Assessment and Plan  Patient Details  Name: Alexander Rojas MRN: [id number] Date of Birth: 01/18/1956  OT Diagnosis: abnormal posture, acute pain, altered mental status, lumbago (low back pain) and muscle weakness (generalized) Rehab Potential:   ELOS: 15-17 ( may be shortened pending pain )   Today's Date: 10/11/2020 OT Individual Time: 1245-8099 OT Individual Time Calculation (min): 60 min  and Today's Date: 10/11/2020 OT Missed Time: 15 Minutes Missed Time Reason: Pain    Hospital Problem: Principal Problem:   Lumbar radiculopathy   Past Medical History:  Past Medical History:  Diagnosis Date  . Back pain   . BPH (benign prostatic hyperplasia)   . Extensor tendon laceration, finger, open wound, initial encounter    left thumb  . Hypertension   . Hypothyroidism   . Macrocytosis   . Numbness and tingling   . Pars defect with spondylolisthesis    Past Surgical History:  Past Surgical History:  Procedure Laterality Date  . ABDOMINAL EXPOSURE N/A 09/19/2020   Procedure: ABDOMINAL EXPOSURE;  Surgeon: Rosetta Posner, MD;  Location: [facility] OR;  Service: Vascular;  Laterality: N/A;  anterior  . ANTERIOR LUMBAR FUSION N/A 09/19/2020   Procedure: Anterior Lumbar Interbody Fusion Lumbar Five-Sacral One, posterior lateral fusion with gill decompression Lumbar Five-Sacral One.;  Surgeon: Eustace Moore, MD;  Location: [place];  Service: Neurosurgery;  Laterality: N/A;  anterior  . APPLICATION OF INTRAOPERATIVE CT SCAN N/A 09/27/2020   Procedure: APPLICATION OF INTRAOPERATIVE CT SCAN;  Surgeon: Eustace Moore, MD;  Location: [place];  Service: Neurosurgery;  Laterality: N/A;  . COLONOSCOPY WITH PROPOFOL N/A 12/18/2018   Procedure: COLONOSCOPY WITH BIOPSY;  Surgeon: Lucilla Lame, MD;  Location: [place];  Service: Endoscopy;  Laterality: N/A;  NEEDS TO STAY FIRST  . ESOPHAGOGASTRODUODENOSCOPY    . ESOPHAGOGASTRODUODENOSCOPY (EGD) WITH PROPOFOL N/A 02/23/2019    Procedure: ESOPHAGOGASTRODUODENOSCOPY (EGD) WITH PROPOFOL;  Surgeon: Lucilla Lame, MD;  Location[facility] ENDOSCOPY;  Service: Endoscopy;  Laterality: N/A;  . HERNIA REPAIR    . LAMINECTOMY WITH POSTERIOR LATERAL ARTHRODESIS LEVEL 1 N/A 09/19/2020   Procedure: LAMINECTOMY WITH POSTERIOR LATERAL ARTHRODESIS LEVEL 1;  Surgeon: Eustace Moore, MD;  Location: [place];  Service: Neurosurgery;  Laterality: N/A;  posterior  . LAMINECTOMY WITH POSTERIOR LATERAL ARTHRODESIS LEVEL 2 N/A 09/27/2020   Procedure: Lumbar exploration with Extension of Posterior Instrumentation Lumbar Four to the [place] with Airo;  Surgeon: Eustace Moore, MD;  Location: [place];  Service: Neurosurgery;  Laterality: N/A;  . LIPOMA EXCISION Right    upper arm  . POLYPECTOMY N/A 12/18/2018   Procedure: POLYPECTOMY;  Surgeon: Lucilla Lame, MD;  Location: [place];  Service: Endoscopy;  Laterality: N/A;  . TENDON REPAIR Left 11/05/2019   Procedure: LEFT THUMB TENDON REPAIR;  Surgeon: Cindra Presume, MD;  Location: [place];  Service: Plastics;  Laterality: Left;  Axillary block, MAC  . UMBILICAL HERNIA REPAIR      Assessment & Plan Clinical Impression: Alexander Rojas a 64 year old right-handed male with history of hypertension, hypothyroidism, macrocytosis, BPH, chronic back painmaintained on Nucyntawith recent decompressive lumbar laminectomy hemifacetectomyand foraminotomies L5-S1 with anterior lumbar interbody fusion L5-S1 posterior fixation L5-S1 09/19/2020 per Dr. Sherley Bounds for lytic spondylolisthesis with spinal stenosis, foraminal stenosis and discharged to home. 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 1 level home with level entry. Presented 09/26/2020 with complaints of  severe right leg pain with movement onset of symptoms over the past few days rapidly worsening since that time. X-rays and imaging  revealed failure of anterior and posterior instrumentation after L5-S1 fusion. Patient underwent lumbar reexploration with removal of instrumentation L5-S1 repeat decompression of both L5 nerve roots intertransverse arthrodesis L4-S1 bilaterally with segmental fixation L4 to the ileumbilaterally 09/27/2020 per Dr. Sherley Bounds. Back brace as directed applied in sitting position. He was cleared to begin Lovenox for DVT prophylaxis. Most recent CBC 09/30/2020 hemoglobin 11.9 WBC 12,100. Due to patient's decreased overall functional mobility weakness lower extremities he was admitted for a comprehensive rehab program. Patient transferred to CIR on 10/10/2020 .    Patient currently requires max with basic self-care skills secondary to muscle weakness, decreased cardiorespiratoy endurance, impaired timing and sequencing, unbalanced muscle activation and back pain,   and decreased sitting balance, decreased standing balance, decreased postural control and decreased balance strategies.  Prior to hospitalization, patient could complete BADL, IADLs, and working as a Psychologist, sport and exercise with independent .  Patient will benefit from skilled intervention to decrease level of assist with basic self-care skills and increase independence with basic self-care skills prior to discharge home with care partner.  Anticipate patient will require 24 hour supervision and follow up home health.  OT - End of Session Activity Tolerance: Tolerates 30+ min activity with multiple rests Endurance Deficit: Yes OT Assessment OT Barriers to Discharge: Home environment access/layout OT Barriers to Discharge Comments: severe back pain limiting OT Patient demonstrates impairments in the following area(s): Balance;Behavior;Cognition;Endurance;Motor;Pain;Safety OT Basic ADL's Functional Problem(s): Grooming;Bathing;Dressing;Toileting OT Transfers Functional Problem(s): Toilet;Tub/Shower OT Additional Impairment(s): None OT Plan OT Intensity:  Minimum of 1-2 x/day, 45 to 90 minutes OT Frequency: 5 out of 7 days OT Duration/Estimated Length of Stay: 18-21 days ( may be shortened pending pain ) OT Treatment/Interventions: Balance/vestibular training;Discharge planning;Pain management;Self Care/advanced ADL retraining;Therapeutic Activities;UE/LE Coordination activities;Visual/perceptual remediation/compensation;Therapeutic Exercise;Skin care/wound managment;Patient/family education;Functional mobility training;Disease mangement/prevention;Community reintegration;DME/adaptive equipment instruction;Neuromuscular re-education;Psychosocial support;UE/LE Strength taining/ROM;Wheelchair propulsion/positioning OT Self Feeding Anticipated Outcome(s): no goal OT Basic Self-Care Anticipated Outcome(s): Supervision OT Toileting Anticipated Outcome(s): Supervision OT Bathroom Transfers Anticipated Outcome(s): Supervision OT Recommendation Recommendations for Other Services: Neuropsych consult Patient destination: Home Follow Up Recommendations: Home health OT;24 hour supervision/assistance Equipment Recommended: To be determined   OT Evaluation Precautions/Restrictions  Precautions Precautions: Back Precaution Comments: reviewed back precautions Required Braces or Orthoses: Spinal Brace Spinal Brace: Applied in supine position;Lumbar corset Restrictions Weight Bearing Restrictions: No General OT Amount of Missed Time: 15 Minutes Vital Signs Therapy Vitals Pulse Rate: 76 BP: 107/78 Pain Pain Assessment Pain Scale: 0-10 Pain Score: 10-Worst pain ever Pain Type: Surgical pain Pain Location: Back Home Living/Prior Functioning Home Living Family/patient expects to be discharged to:: Private residence Living Arrangements: Spouse/significant other Available Help at Discharge: Family Type of Home: House Home Access: Level entry Home Layout: Multi-level,Bed/bath upstairs Alternate Level Stairs-Number of Steps: flight of 10 stairs to  bed and bath; but has an Media planner (which came with the house) Bathroom Shower/Tub: Multimedia programmer: Standard Bathroom Accessibility: Yes  Lives With: Spouse IADL History Occupation: Full time employment 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 Prior Function Level of Independence: Independent with basic ADLs,Independent with gait,Independent with transfers  Able to Take Stairs?: Yes Driving: Yes Comments: Pt was fully independent 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 [place] Vision/History: Wears glasses Wears Glasses: At all times Patient Visual Report: No change from baseline Perception  Perception: Within Functional Limits Praxis Praxis:  Impaired Praxis-Other Comments: limited d/t pain at eval Cognition Overall Cognitive Status: Impaired/Different from baseline Arousal/Alertness: Awake/alert Orientation Level: Person;Place;Situation Person: Oriented Place: Oriented Situation: Oriented Year: 2021 Month: December Day of Week: Correct Memory: Appears intact Immediate Memory Recall: Sock;Blue;Bed Memory Recall Sock: Without Cue Memory Recall Blue: Without Cue Memory Recall Bed: Without Cue Awareness: Appears intact Problem Solving: Appears intact Safety/Judgment: Appears intact Sensation Sensation Light Touch: Appears Intact Coordination Gross Motor Movements are Fluid and Coordinated: No Fine Motor Movements are Fluid and Coordinated: Yes Coordination and Movement Description: limited at eval d/t high pain levels Motor  Motor Motor: Abnormal postural alignment and control Motor - Skilled Clinical Observations: limited by pain  Trunk/Postural Assessment  Cervical Assessment Cervical Assessment: Within Functional Limits Thoracic Assessment Thoracic Assessment: Exceptions to [facility] Lumbar Assessment Lumbar Assessment: Exceptions to [facility] (unable to fully assess d/t brief sit at EOB, limited d/t pain) Postural  Control Postural Control: Deficits on evaluation  Balance Balance Balance Assessed: Yes Static Sitting Balance Static Sitting - Balance Support: Bilateral upper extremity supported;Feet supported Static Sitting - Level of Assistance: 3: Mod assist Dynamic Sitting Balance Sitting balance - Comments: BUE support at EOB, limited d/t pain and rigidity Extremity/Trunk Assessment RUE Assessment RUE Assessment: Within Functional Limits General Strength Comments: 4/5 grossly LUE Assessment LUE Assessment: Within Functional Limits General Strength Comments: 4/5 grossly  Care Tool Care Tool Self Care Eating    Set up    Oral Care     Set up    Bathing   Body parts bathed by patient: Right arm;Left arm;Chest;Abdomen;Face Body parts bathed by helper: Front perineal area;Buttocks;Right lower leg;Left lower leg;Right upper leg;Left upper leg   Assist Level: Total Assistance - Patient < 25%    Upper Body Dressing(including orthotics)   What is the patient wearing?: [place] only   Assist Level: Minimal Assistance - Patient > 75%    Lower Body Dressing (excluding footwear)   What is the patient wearing?: Hospital gown only Assist for lower body dressing: Minimal Assistance - Patient > 75%    Putting on/Taking off footwear   What is the patient wearing?: Socks;Ted hose Assist for footwear: Dependent - Patient 0%       Care Tool Toileting Toileting activity Toileting Activity did not occur (Clothing management and hygiene only): N/A (no void or bm) (did not need to void and in too much pain for toileting at eval)       Care Tool Bed Mobility Roll left and right activity   Roll left and right assist level: Supervision/Verbal cueing    Sit to lying activity   Sit to lying assist level: Total Assistance - Patient < 25%    Lying to sitting edge of bed activity   Lying to sitting edge of bed assist level: Moderate Assistance - Patient 50 - 74%     Care Tool Transfers Sit  to stand transfer Sit to stand activity did not occur: Safety/medical concerns      Chair/bed transfer Chair/bed transfer activity did not occur: Safety/medical concerns       Toilet transfer Toilet transfer activity did not occur: Safety/medical concerns       Care Tool Cognition Expression of Ideas and Wants Expression of Ideas and Wants: Some difficulty - exhibits some difficulty with expressing needs and ideas (e.g, some words or finishing thoughts) or speech is not clear   Understanding Verbal and Non-Verbal Content Understanding Verbal and Non-Verbal Content: Usually understands - understands most conversations, but misses  some part/intent of message. Requires cues at times to understand   Memory/Recall Ability *first 3 days only Memory/Recall Ability *first 3 days only: Current season;Location of own room;That he or she is in a hospital/hospital unit    Refer to Care Plan for [place] 1 OT Short Term Goal 1 (Week 1): Pt will perform supine to sit EOB in prep for ADL with Min A OT Short Term Goal 2 (Week 1): Pt will perform sit to stand Mod A in prep for LB ADL OT Short Term Goal 3 (Week 1): Pt will perform BSC/toilet transfer with Mod A and LRAD OT Short Term Goal 4 (Week 1): Pt will don brace with no more than Mod A  Recommendations for other services: Neuropsych   Skilled Therapeutic Intervention ADL ADL Upper Body Bathing: Supervision/safety Where Assessed-Upper Body Bathing: Bed level Lower Body Bathing: Dependent Where Assessed-Lower Body Bathing: Bed level Upper Body Dressing: Minimal assistance Lower Body Dressing: Dependent Where Assessed-Lower Body Dressing: Bed level Toileting: Not assessed Toilet Transfer: Not assessed Mobility  Bed Mobility Bed Mobility: Rolling Right;Rolling Left;Right Sidelying to Sit;Sit to Supine Rolling Right: Supervision/verbal cueing Rolling Left: Supervision/Verbal cueing Right Sidelying to Sit:  Moderate Assistance - Patient 50-74% Sit to Supine: Maximal Assistance - Patient 25-49%    Skilled Interventions: Pt greeted at time of session supine in bed resting agreeable to OT session, no c/o pain resting but pt stating he was not able to do much in am PT session d/t pain. Pt stating he had been premedicated. Explained role and purpose of OT, pt agreeable. Reviewed back precautions "BLT" with pt able to recall but did not remember staff going over precautions prior.   Focus of evaluation on bed mobility with rolling right and left with Suprevision multiple trials for changing hospital gown with Min A and adjusting back brace as he was not able to tolerate in sitting. Reviewed how to don/doff brace with moderate carryover noted. Supine to sit with log rolling with Mod/Max A with pt able to push up on R elbow but pain limiting at 10/10 pain in back and returned to supine Max A. Positioned for comfort and pain resolved, performed UB/LB bathing at bed level as pt unable to sit up at EOB d/t pain with Supervision for UB and Total A for LB. Donned TEDS and socks dependently. Pt unable to continue session d/t pain, missed 15 minutes of OT. Call bell in reach all needs met. Discussed with RN regarding pain levels.    Discharge Criteria: Patient will be discharged from OT if patient refuses treatment 3 consecutive times without medical reason, if treatment goals not met, if there is a change in medical status, if patient makes no progress towards goals or if patient is discharged from hospital.  The above assessment, treatment plan, treatment alternatives and goals were discussed and mutually agreed upon: by patient  Viona Gilmore 10/11/2020, 12:20 PM

## 2020-10-11 NOTE — Progress Notes (Signed)
[facility] Individual Statement of Services  Patient Name:  Alexander Rojas  Date:  10/11/2020  Welcome to the [facility].  Our goal is to provide you with an individualized program based on your diagnosis and situation, designed to meet your specific needs.  With this comprehensive rehabilitation program, you will be expected to participate in at least 3 hours of rehabilitation therapies Monday-Friday, with modified therapy programming on the weekends.  Your rehabilitation program will include the following services:  Physical Therapy (PT), Occupational Therapy (OT), Speech Therapy (ST), 24 hour per day rehabilitation nursing, Therapeutic Recreaction (TR), Neuropsychology, Care Coordinator, Rehabilitation Medicine, Nutrition Services, Pharmacy Services and Other  Weekly team conferences will be held on Tuesdays to discuss your progress.  Your Inpatient Rehabilitation Care Coordinator will talk with you frequently to get your input and to update you on team discussions.  Team conferences with you and your family in attendance may also be held.  Expected length of stay: 7-10 Days  Overall anticipated outcome: MOD I  Depending on your progress and recovery, your program may change. Your Inpatient Rehabilitation Care Coordinator will coordinate services and will keep you informed of any changes. Your Inpatient Rehabilitation Care Coordinator's name and contact numbers are listed  below.  The following services may also be recommended but are not provided by the [facility]:    [place] will be made to provide these services after discharge if needed.  Arrangements include referral to agencies that provide these services.  Your insurance has been verified to be:  [name] Your primary doctor is:  Emerson Monte, MD  Pertinent information  will be shared with your doctor and your insurance company.  Inpatient Rehabilitation Care Coordinator:  Erlene Quan, [place] or [phone]  Information discussed with and copy given to patient by: Dyanne Iha, 10/11/2020, 2:35 PM

## 2020-10-11 NOTE — H&P (Signed)
Physical Medicine and Rehabilitation Admission H&P       HPI: Alexander Rojas is a 64 year old right-handed male with history of hypertension, hypothyroidism, macrocytosis, BPH, chronic back pain maintained on Nucynta with recent decompressive lumbar laminectomy hemifacetectomy and foraminotomies L5-S1 with anterior lumbar interbody fusion L5-S1 posterior fixation L5-S1 09/19/2020 per Dr. Sherley Bounds for lytic spondylolisthesis with spinal stenosis, foraminal stenosis and discharged to home.  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  1 level home with level entry.  Presented 09/26/2020 with complaints of severe right leg pain with movement onset of symptoms over the past few days rapidly worsening since that time.  X-rays and imaging revealed failure of anterior and posterior instrumentation after L5-S1 fusion.  Patient underwent lumbar reexploration with removal of instrumentation L5-S1 repeat decompression of both L5 nerve roots intertransverse arthrodesis L4-S1 bilaterally with segmental fixation L4 to the ileum bilaterally 09/27/2020 per Dr. Sherley Bounds.  Back brace as directed applied in sitting position.  He was cleared to begin Lovenox for DVT prophylaxis.  Most recent CBC 09/30/2020 hemoglobin 11.9 WBC 12,100.  Due to patient's decreased overall functional mobility weakness lower extremities he was admitted for a comprehensive rehab program.   Review of Systems  Constitutional: Negative for chills and fever.  HENT: Negative for hearing loss.   Eyes: Negative for blurred vision and double vision.  Respiratory: Negative for cough and shortness of breath.   Cardiovascular: Negative for chest pain, palpitations and leg swelling.  Gastrointestinal: Positive for constipation. Negative for heartburn, nausea and vomiting.  Genitourinary: Positive for urgency. Negative for dysuria, flank pain and  hematuria.  Musculoskeletal: Positive for back pain and myalgias.  Skin: Negative for rash.  Neurological: Positive for sensory change and weakness.  All other systems reviewed and are negative.       Past Medical History:  Diagnosis Date  . Back pain    . BPH (benign prostatic hyperplasia)    . Extensor tendon laceration, finger, open wound, initial encounter      left thumb  . Hypertension    . Hypothyroidism    . Macrocytosis    . Numbness and tingling    . Pars defect with spondylolisthesis           Past Surgical History:  Procedure Laterality Date  . ABDOMINAL EXPOSURE N/A 09/19/2020    Procedure: ABDOMINAL EXPOSURE;  Surgeon: Rosetta Posner, MD;  Location: [facility] OR;  Service: Vascular;  Laterality: N/A;  anterior  . ANTERIOR LUMBAR FUSION N/A 09/19/2020    Procedure: Anterior Lumbar Interbody Fusion Lumbar Five-Sacral One, posterior lateral fusion with gill decompression Lumbar Five-Sacral One.;  Surgeon: Eustace Moore, MD;  Location: [place];  Service: Neurosurgery;  Laterality: N/A;  anterior  . APPLICATION OF INTRAOPERATIVE CT SCAN N/A 09/27/2020    Procedure: APPLICATION OF INTRAOPERATIVE CT SCAN;  Surgeon: Eustace Moore, MD;  Location: [place];  Service: Neurosurgery;  Laterality: N/A;  . COLONOSCOPY WITH PROPOFOL N/A 12/18/2018    Procedure: COLONOSCOPY WITH BIOPSY;  Surgeon: Lucilla Lame, MD;  Location: [place];  Service: Endoscopy;  Laterality: N/A;  NEEDS TO STAY FIRST  . ESOPHAGOGASTRODUODENOSCOPY      . ESOPHAGOGASTRODUODENOSCOPY (EGD) WITH PROPOFOL N/A 02/23/2019    Procedure: ESOPHAGOGASTRODUODENOSCOPY (EGD) WITH PROPOFOL;  Surgeon: Lucilla Lame, MD;  Location: [facility] ENDOSCOPY;  Service: Endoscopy;  Laterality: N/A;  . HERNIA REPAIR      .  LAMINECTOMY WITH POSTERIOR LATERAL ARTHRODESIS LEVEL 1 N/A 09/19/2020    Procedure: LAMINECTOMY WITH POSTERIOR LATERAL ARTHRODESIS LEVEL 1;  Surgeon: Eustace Moore, MD;  Location: [place];  Service: Neurosurgery;  Laterality:  N/A;  posterior  . LAMINECTOMY WITH POSTERIOR LATERAL ARTHRODESIS LEVEL 2 N/A 09/27/2020    Procedure: Lumbar exploration with Extension of Posterior Instrumentation Lumbar Four to the [place] with Airo;  Surgeon: Eustace Moore, MD;  Location: [place];  Service: Neurosurgery;  Laterality: N/A;  . LIPOMA EXCISION Right      upper arm  . POLYPECTOMY N/A 12/18/2018    Procedure: POLYPECTOMY;  Surgeon: Lucilla Lame, MD;  Location: [place];  Service: Endoscopy;  Laterality: N/A;  . TENDON REPAIR Left 11/05/2019    Procedure: LEFT THUMB TENDON REPAIR;  Surgeon: Cindra Presume, MD;  Location: [place];  Service: Plastics;  Laterality: Left;  Axillary block, MAC  . UMBILICAL HERNIA REPAIR        History reviewed. No pertinent family history. Social History:  reports that he has never smoked. He has never used smokeless tobacco. He reports previous alcohol use. He reports that he does not use drugs. Allergies: No Known Allergies       Medications Prior to Admission  Medication Sig Dispense Refill  . gabapentin (NEURONTIN) 300 MG capsule Take 300 mg by mouth 3 (three) times daily.      . hydrochlorothiazide (MICROZIDE) 12.5 MG capsule Take 12.5 mg by mouth daily.      Marland Kitchen levothyroxine (SYNTHROID) 100 MCG tablet Take 100 mcg by mouth daily before breakfast.       . losartan (COZAAR) 50 MG tablet Take 50 mg by mouth daily.       . methocarbamol (ROBAXIN) 500 MG tablet Take 1 tablet (500 mg total) by mouth every 6 (six) hours as needed for muscle spasms. 60 tablet 1  . methylPREDNISolone (MEDROL DOSEPAK) 4 MG TBPK tablet Take 4 mg by mouth as directed. 6 day dose pack      . NUCYNTA 100 MG TABS Take 100 mg by mouth every 4 (four) hours as needed for pain.      Marland Kitchen ondansetron (ZOFRAN) 4 MG tablet Take 1 tablet (4 mg total) by mouth every 6 (six) hours as needed for nausea or vomiting. 20 tablet 0  . pantoprazole (PROTONIX) 40 MG tablet Take 40 mg by mouth daily.       . tapentadol  (NUCYNTA) 75 MG tablet Take 1 tablet (75 mg total) by mouth every 6 (six) hours as needed for moderate pain. (Patient not taking: Reported on 09/26/2020) 30 tablet 0      Drug Regimen Review Drug regimen was reviewed and remains appropriate with no significant issues identified   Home: Home Living Family/patient expects to be discharged to:: Private residence Living Arrangements: Spouse/significant other Available Help at Discharge: Family Type of Home: House Home Access: Level entry Home Layout: Multi-level,Bed/bath upstairs Alternate Level Stairs-Number of Steps: flights Bathroom Shower/Tub: Multimedia programmer: Associate Professor Accessibility: Yes Home Equipment: None   Functional History: Prior Function Level of Independence: Independent Comments: 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  Transfers Overall transfer level: Needs assistance Equipment used: Rolling walker (2 wheeled) Transfers:  (stand from tilt bed) Sit to Stand: Min assist,+2 physical assistance,From elevated surface Stand pivot transfers: Min assist General transfer comment: First time standing up into a RW, but pt unable to get comfortable place weight up through hands and asked to [place] RW to EVA. Ambulation/Gait Ambulation/Gait assistance: Min assist Gait Distance (Feet): 120 Feet (then 130 and another 100 feet with standing rest propped against wall, against the sink.) Assistive device:  (EVA walker) Gait Pattern/deviations: Step-through pattern,Decreased stride length,Decreased stance time - right General Gait Details: more steady  with the EVA walker, still moderately heavy weight bearing through his elbows, improving heel toe pattern.  Frequent stops to rest, arch back to relieve worsening pain/cramping. Gait velocity: slower Gait velocity interpretation: <1.8 ft/sec, indicate of risk for recurrent falls   ADL: ADL Overall ADL's : Needs assistance/impaired Eating/Feeding: Set up,Bed level Grooming: Oral care,Standing,Minimal assistance Grooming Details (indicate cue type and reason): at sink using Eva walker Upper Body Bathing: Minimal assistance,Bed level Lower Body Bathing: Maximal assistance,Bed level Upper Body Dressing : Maximal assistance,Bed level Lower Body Dressing: Total assistance,Bed level Toilet Transfer: Total assistance Toilet Transfer Details (indicate cue type and reason): unable to safely attempt today Toileting- Clothing Manipulation and Hygiene: Total assistance,Bed level Functional mobility during ADLs: Minimal assistance,Min guard (bed level)   Cognition: Cognition Overall Cognitive Status: Within Functional Limits for tasks assessed Orientation Level: Oriented X4 Cognition Arousal/Alertness: Awake/alert Behavior During Therapy: WFL for tasks assessed/performed Overall Cognitive Status: Within Functional Limits for tasks assessed General Comments: Anxiety seemed improved today.  Pt able to work though and problem solve with a clearer head.   Physical Exam: Blood pressure 102/68, pulse 72, temperature 98.5 F (36.9 C), temperature source Oral, resp. rate 18, height _0  (1.803 m), weight 92.5 kg, SpO2 95 %. Physical Exam Constitutional:      General: He is not in acute distress.    Appearance: Normal appearance. He is normal weight.  HENT:     Head: Normocephalic and atraumatic.     Right Ear: External ear normal.     Left Ear: External ear normal.     Nose: Nose normal. No congestion.     Mouth/Throat:     Mouth: Mucous membranes are moist.     Pharynx: Oropharynx is clear.   Eyes:     Extraocular Movements: Extraocular movements intact.     Conjunctiva/sclera: Conjunctivae normal.     Pupils: Pupils are equal, round, and reactive to light.  Cardiovascular:     Rate and Rhythm: Normal rate and regular rhythm.     Pulses: Normal pulses.     Heart sounds: No murmur heard. No gallop.   Pulmonary:     Effort: Pulmonary effort is normal. No respiratory distress.     Breath sounds: Normal breath sounds. No wheezing.  Abdominal:     General: Bowel sounds are normal. There is no distension.     Palpations: Abdomen is soft.     Tenderness: There is no abdominal tenderness.  Musculoskeletal:     Cervical back: Normal range of motion.     Comments: Tender along right buttock and low back with palpation and with RLE lift in bed.  Skin:    Comments: Back incision is clean and dry, essentially healed/closed  Neurological:     Mental Status: He is alert.     Comments: Patient is alert oriented x3 and cooperative with exam.  Follows full commands. UE 5/5. RLE 3+/5 HF (pain), 4+ KE and KF, ADF 4/5, APF 5/5. LLE: 4-/5 HF, 4+/5 KE, KF, ADF/APF. Mild sensory loss along anterior right foot extending to anterolateral leg  Psychiatric:        Mood and Affect: Mood normal.        Behavior: Behavior normal.        Lab Results Last 48 Hours  No results found for this or any previous visit (from the past 48 hour(s)).   Imaging Results (Last 48 hours)  No results found.           Medical Problem List and Plan: 1.  Lower extremity weakness right greater than left with numbness and decrease in sensation secondary to failure of anterior and posterior instrumentation L5-S1 fusion from 09/19/2020.  Status post lumbar reexploration and removal of instrumentation L5-S1 repeat decompression both L5 nerve roots with segmental fixation L4 09/27/2020.  Pt demonstrates residual right L5 radiculopathy on exam.             -Back brace as directed             -patient may  shower              -ELOS/Goals: 7-10 days, mod I goals with PT and OT 2.  Antithrombotics: -DVT/anticoagulation: Subcutaneous Lovenox.  Check vascular study             -antiplatelet therapy: N/A 3. Pain Management: Nucynta 75-100 mg every 6 hours prn, Robaxin 500 mg every 6 hours as needed Neurontin 400 mg 3 times daily, tylenol prn.              -pt wants to wean off nucynta 4. Mood: Provide emotional support             -antipsychotic agents: N/A 5. Neuropsych: This patient is capable of making decisions on his own behalf. 6. Skin/Wound Care: Routine skin checks 7. Fluids/Electrolytes/Nutrition: Routine in and outs with follow-up chemistries on admit 8.  Hypertension.  HCTZ 12.5 mg daily, Cozaar 50 mg daily.  Monitor with increased mobility 9.  Hypothyroidism.  Synthroid 10.  Constipation.  Colace twice daily, Movantik 12.5 mg daily, MiraLAX daily as needed             -moved bowels last 12/19 11.  BPH.  Check PVR's     Cathlyn Parsons, PA-C 10/10/2020  I have personally performed a face to face diagnostic evaluation of this patient and formulated the key components of the plan.  Additionally, I have personally reviewed laboratory data, imaging studies, as well as relevant notes and concur with the physician assistant's documentation above.  The patient's status has not changed from the original H&P.  Any changes in documentation from the acute care chart have been noted above. The patient was seen and this H&P was completed on 10/10/20.   Meredith Staggers, MD, [facility]

## 2020-10-11 NOTE — Plan of Care (Signed)
  Problem: Sit to Stand Goal: LTG:  Patient will perform sit to stand in prep for activites of daily living with assistance level (OT) Description: LTG:  Patient will perform sit to stand in prep for activites of daily living with assistance level (OT) Flowsheets (Taken 10/11/2020 1251) LTG: PT will perform sit to stand in prep for activites of daily living with assistance level: Supervision/Verbal cueing   Problem: RH Grooming Goal: LTG Patient will perform grooming w/assist,cues/equip (OT) Description: LTG: Patient will perform grooming with assist, with/without cues using equipment (OT) Flowsheets (Taken 10/11/2020 1251) LTG: Pt will perform grooming with assistance level of: Set up assist    Problem: RH Bathing Goal: LTG Patient will bathe all body parts with assist levels (OT) Description: LTG: Patient will bathe all body parts with assist levels (OT) Flowsheets (Taken 10/11/2020 1251) LTG: Pt will perform bathing with assistance level/cueing: Supervision/Verbal cueing   Problem: RH Dressing Goal: LTG Patient will perform upper body dressing (OT) Description: LTG Patient will perform upper body dressing with assist, with/without cues (OT). Flowsheets (Taken 10/11/2020 1251) LTG: Pt will perform upper body dressing with assistance level of: Supervision/Verbal cueing Goal: LTG Patient will perform lower body dressing w/assist (OT) Description: LTG: Patient will perform lower body dressing with assist, with/without cues in positioning using equipment (OT) Flowsheets (Taken 10/11/2020 1251) LTG: Pt will perform lower body dressing with assistance level of: Supervision/Verbal cueing   Problem: RH Toileting Goal: LTG Patient will perform toileting task (3/3 steps) with assistance level (OT) Description: LTG: Patient will perform toileting task (3/3 steps) with assistance level (OT)  Flowsheets (Taken 10/11/2020 1251) LTG: Pt will perform toileting task (3/3 steps) with assistance  level: Supervision/Verbal cueing   Problem: RH Toilet Transfers Goal: LTG Patient will perform toilet transfers w/assist (OT) Description: LTG: Patient will perform toilet transfers with assist, with/without cues using equipment (OT) Flowsheets (Taken 10/11/2020 1251) LTG: Pt will perform toilet transfers with assistance level of: Supervision/Verbal cueing   Problem: RH Tub/Shower Transfers Goal: LTG Patient will perform tub/shower transfers w/assist (OT) Description: LTG: Patient will perform tub/shower transfers with assist, with/without cues using equipment (OT) Flowsheets (Taken 10/11/2020 1251) LTG: Pt will perform tub/shower stall transfers with assistance level of: Supervision/Verbal cueing

## 2020-10-11 NOTE — Progress Notes (Signed)
Patient heard yelling due to pain with therapy attempts. PT concerned about his pain level and well as limitation and question pain regimen. Acute chart reviewed--note that patient was on Nyc[place]ta 100 mg qid till yesterday am. Will resume and schedule mg qid to help with consistent relief/ activity tolerance and then taper as able.

## 2020-10-11 NOTE — Evaluation (Signed)
Physical Therapy Assessment and Plan  Patient Details  Name: Alexander Rojas MRN: [id number] Date of Birth: 1955/12/01  PT Diagnosis: Abnormal posture, Difficulty walking, Dizziness and giddiness, Muscle spasms, Muscle weakness and Pain in R buttock Rehab Potential: Good ELOS: 10-17 days   Today's Date: 10/11/2020 PT Individual Time:0900 [id number]; 60 min PT Individual Time Calculation (min): 70 min    Hospital Problem: Principal Problem:   Lumbar radiculopathy   Past Medical History:  Past Medical History:  Diagnosis Date  . Back pain   . BPH (benign prostatic hyperplasia)   . Extensor tendon laceration, finger, open wound, initial encounter    left thumb  . Hypertension   . Hypothyroidism   . Macrocytosis   . Numbness and tingling   . Pars defect with spondylolisthesis    Past Surgical History:  Past Surgical History:  Procedure Laterality Date  . ABDOMINAL EXPOSURE N/A 09/19/2020   Procedure: ABDOMINAL EXPOSURE;  Surgeon: Rosetta Posner, MD;  Location: [facility] OR;  Service: Vascular;  Laterality: N/A;  anterior  . ANTERIOR LUMBAR FUSION N/A 09/19/2020   Procedure: Anterior Lumbar Interbody Fusion Lumbar Five-Sacral One, posterior lateral fusion with gill decompression Lumbar Five-Sacral One.;  Surgeon: Eustace Moore, MD;  Location: [place];  Service: Neurosurgery;  Laterality: N/A;  anterior  . APPLICATION OF INTRAOPERATIVE CT SCAN N/A 09/27/2020   Procedure: APPLICATION OF INTRAOPERATIVE CT SCAN;  Surgeon: Eustace Moore, MD;  Location: [place];  Service: Neurosurgery;  Laterality: N/A;  . COLONOSCOPY WITH PROPOFOL N/A 12/18/2018   Procedure: COLONOSCOPY WITH BIOPSY;  Surgeon: Lucilla Lame, MD;  Location: [place];  Service: Endoscopy;  Laterality: N/A;  NEEDS TO STAY FIRST  . ESOPHAGOGASTRODUODENOSCOPY    . ESOPHAGOGASTRODUODENOSCOPY (EGD) WITH PROPOFOL N/A 02/23/2019   Procedure: ESOPHAGOGASTRODUODENOSCOPY (EGD) WITH PROPOFOL;  Surgeon: Lucilla Lame, MD;  Location:  [facility] ENDOSCOPY;  Service: Endoscopy;  Laterality: N/A;  . HERNIA REPAIR    . LAMINECTOMY WITH POSTERIOR LATERAL ARTHRODESIS LEVEL 1 N/A 09/19/2020   Procedure: LAMINECTOMY WITH POSTERIOR LATERAL ARTHRODESIS LEVEL 1;  Surgeon: Eustace Moore, MD;  Location: [place];  Service: Neurosurgery;  Laterality: N/A;  posterior  . LAMINECTOMY WITH POSTERIOR LATERAL ARTHRODESIS LEVEL 2 N/A 09/27/2020   Procedure: Lumbar exploration with Extension of Posterior Instrumentation Lumbar Four to the [place] with Airo;  Surgeon: Eustace Moore, MD;  Location: [place];  Service: Neurosurgery;  Laterality: N/A;  . LIPOMA EXCISION Right    upper arm  . POLYPECTOMY N/A 12/18/2018   Procedure: POLYPECTOMY;  Surgeon: Lucilla Lame, MD;  Location: [place];  Service: Endoscopy;  Laterality: N/A;  . TENDON REPAIR Left 11/05/2019   Procedure: LEFT THUMB TENDON REPAIR;  Surgeon: Cindra Presume, MD;  Location: [place];  Service: Plastics;  Laterality: Left;  Axillary block, MAC  . UMBILICAL HERNIA REPAIR      Assessment & Plan Clinical Impression: Alexander Rojas a 64 year old right-handed male with history of hypertension, hypothyroidism, macrocytosis, BPH, chronic back painmaintained on Nucyntawith recent decompressive lumbar laminectomy hemifacetectomyand foraminotomies L5-S1 with anterior lumbar interbody fusion L5-S1 posterior fixation L5-S1 09/19/2020 per Dr. Sherley Bounds for lytic spondylolisthesis with spinal stenosis, foraminal stenosis and discharged to home. 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 1 level home with level entry. Presented 09/26/2020 with complaints of severe right leg pain with movement onset of symptoms over the past few days rapidly worsening since that  time. X-rays and imaging revealed failure of anterior and posterior instrumentation after L5-S1 fusion. Patient underwent lumbar  reexploration with removal of instrumentation L5-S1 repeat decompression of both L5 nerve roots intertransverse arthrodesis L4-S1 bilaterally with segmental fixation L4 to the ileumbilaterally 09/27/2020 per Dr. Sherley Bounds. Back brace as directed applied in sitting position. He was cleared to begin Lovenox for DVT prophylaxis. Most recent CBC 09/30/2020 hemoglobin 11.9 WBC 12,100. Due to patient's decreased overall functional mobility weakness lower extremities he was admitted for a comprehensive rehab program.  Patient transferred to CIR on 10/10/2020 .   Patient currently requires total with mobility secondary to muscle weakness and muscle joint tightness and decreased sitting balance and decreased standing balance.  Prior to hospitalization, patient was independent  with mobility and lived with Spouse in a House home.  Home access is  Level entry;  Home has an elevator.  Patient will benefit from skilled PT intervention to maximize safe functional mobility, minimize fall risk and decrease caregiver burden for planned discharge home alone.  Anticipate patient will benefit from follow up [place] [facility] at discharge.  PT - End of Session Activity Tolerance: Tolerates < 10 min activity with changes in vital signs Endurance Deficit: Yes Endurance Deficit Description: pain limits tolerance of OOB PT Assessment Rehab Potential (ACUTE/IP ONLY): Good PT Patient demonstrates impairments in the following area(s): Balance;Pain;Endurance;Motor;Sensory PT Transfers Functional Problem(s): Bed Mobility;Bed to Chair;Car;Furniture PT Locomotion Functional Problem(s): Ambulation PT Plan PT Intensity: Minimum of 1-2 x/day ,45 to 90 minutes PT Duration Estimated Length of Stay: 10-17 days PT Treatment/Interventions: Ambulation/gait training;Discharge planning;DME/adaptive equipment instruction;Functional mobility training;Pain management;Psychosocial support;Therapeutic Activities;Splinting/orthotics;UE/LE Strength  taining/ROM;Balance/vestibular training;Community reintegration;Neuromuscular re-education;Patient/family education;Stair training;Therapeutic Exercise;UE/LE Coordination activities;Wheelchair propulsion/positioning PT Transfers Anticipated Outcome(s): modified independent basic, min assist car tr PT Locomotion Anticipated Outcome(s): modified independent household gait x 25' with LRAD; Supervision gait x 100' controlled environment with LRAD PT Recommendation Recommendations for Other Services: Therapeutic Recreation consult Therapeutic Recreation Interventions: Other (comment) (leisure pursuits) Follow Up Recommendations: Home health PT Patient destination: Home Equipment Recommended: Rolling walker with 5" wheels Equipment Details: pt may need forearm supports for RW   PT Evaluation Precautions/Restrictions Restrictions Weight Bearing Restrictions: No Pain Pain Assessment Pain Scale: 0-10 Pain Score: 10 Pain Type: sciatic- R buttock; Repositioned; spoke with RN and PA regarding pain meds Home Living/Prior Functioning Home Living Available Help at Discharge: Family Type of Home: House Home Access: Level entry Home Layout: Multi-level;Bed/bath upstairs Alternate Level Stairs-Number of Steps: flight of 10 stairs to bed and bath; but has an Media planner (which came with the house) Bathroom Shower/Tub: Multimedia programmer: Standard  Lives With: Spouse Prior Function Level of Independence: Independent with basic ADLs;Independent with gait;Independent with transfers  Able to Take Stairs?: Yes Driving: Yes Comments: 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 [place] Vision/Perception- wears glasses at all times; no changes per pt     Cognition Orientation Level: Oriented X4 Sensation Sensation Light Touch: Appears Intact Pain at rest in L5 dermatome Coordination Gross Motor Movements are Fluid and Coordinated: No Fine Motor Movements are Fluid  and Coordinated: No Heel Shin Test: unable Motor  Motor Motor: Within Functional Limits ; weakness bil LEs  Trunk/Postural Assessment  Cervical Assessment Cervical Assessment: Within Functional Limits Thoracic Assessment Thoracic Assessment: Within Functional Limits Lumbar Assessment Lumbar Assessment: Within Functional Limits Postural Control Postural Control: Deficits on evaluation Trunk Control: tends to stand in anterior pelvic tilt  Balance Balance Balance Assessed: Yes Static Sitting Balance Static  Sitting - Balance Support: Right upper extremity supported;Left upper extremity supported Static Sitting - Level of Assistance: 5: Stand by assistance Static Sitting - Comment/# of Minutes: 5 min Dynamic Sitting Balance Dynamic Sitting - Balance Support:  (not able to assess due to pain) Static Standing Balance Static Standing - Balance Support: Right upper extremity supported;Left upper extremity supported Static Standing - Level of Assistance: 5: Stand by assistance Static Standing - Comment/# of Minutes: 5 in Dynamic Standing Balance Dynamic Standing - Balance Support: Right upper extremity supported;Left upper extremity supported Dynamic Standing - Level of Assistance: 5: Stand by assistance Dynamic Standing - Balance Activities: Lateral lean/weight shifting Extremity Assessment      RLE Assessment RLE Assessment: Exceptions to [facility] Passive Range of Motion (PROM) Comments: hamstrings tight, approx 30 degrees in supine General Strength Comments: grossly in sitting: hip flexion 1-/5, knee extension 3/5, ankle DF 3/5 LLE Assessment LLE Assessment: Exceptions to [facility] Passive Range of Motion (PROM) Comments: tight HS- appros 40 degrees straight leg raise General Strength Comments: in sitting: hip flexion 2-/5, knee extension 3/5, ankle DF 3/5; resistance not given due to increase in pain  Care Tool Care Tool Bed Mobility Roll left and right activity   Roll left and right  assist level: Supervision/Verbal cueing    Sit to lying activity   Sit to lying assist level: 2 Helpers    Lying to sitting edge of bed activity   Lying to sitting edge of bed assist level: Moderate Assistance - Patient 50 - 74%     Care Tool Transfers Sit to stand transfer Sit to stand activity did not occur: Safety/medical concerns (unable due to pain) Sit to stand assist level: Moderate Assistance - Patient 50 - 74%    Chair/bed transfer Chair/bed transfer activity did not occur: Safety/medical concerns (unable due to pain) Chair/bed transfer assist level: Maximal Assistance - Patient 25 - 49%     Psychologist, counselling transfer activity did not occur: Safety/medical concerns (unable due to pain)        Care Tool Locomotion Ambulation Ambulation activity did not occur: Safety/medical concerns (unable due to pain)        Walk 10 feet activity         Walk 50 feet with 2 turns activity        Walk 150 feet activity        Walk 10 feet on uneven surfaces activity Walk 10 feet on uneven surfaces activity did not occur: Safety/medical concerns (unable due to pain)      Stairs Stair activity did not occur: Safety/medical concerns (unable due to pain)        Walk up/down 1 step activity          Walk up/down 4 steps activity      Walk up/down 12 steps activity        Pick up small objects from floor Pick up small object from the floor (from standing position) activity did not occur: Safety/medical concerns (unable due to pain)      Wheelchair Will patient use wheelchair at discharge?: Yes (for community distances, possibly) Type of Wheelchair: Manual Wheelchair activity did not occur: Safety/medical concerns (unable due to pain)      Wheel 50 feet with 2 turns activity      Wheel 150 feet activity        Refer to Care Plan for [place]  WEEK 1 PT Short Term Goal 1 (Week 1): pt will move supine>< sit with min  assist PT Short Term Goal 2 (Week 1): pt will move sit>< stand with min assist PT Short Term Goal 3 (Week 1): pt will initiate gait  Recommendations for other services: Therapeutic Recreation  Other leisure pursuits  Skilled Therapeutic Intervention  Pain limited getting OOB; PT spoke with Pam, PA who has ordered pain meds to re-start. Therapeutic exercises performed in supine with LEs to increase strength for functional mobility. 25 x 1 alternating ankle pumps,  In pain free zone 8 x 1 R active assistive straight leg raise of very minimal excursion . 15 x 1  each: L straight leg raises, bil hip abduction/adduction in hooklying, adductor squeezes with small pelvic tilts.  Pt sat up x 5 minutes but was extremely uncomfortable R buttock.  LO donned in sitting. +2 needed to return to supine. At end of session, pt resting in bed with needs at hand. Pt requested that LO stay on.           Discharge Criteria: Patient will be discharged from PT if patient refuses treatment 3 consecutive times without medical reason, if treatment goals not met, if there is a change in medical status, if patient makes no progress towards goals or if patient is discharged from hospital.  The above assessment, treatment plan, treatment alternatives and goals were discussed and mutually agreed upon: by patient  Zehra Rucci 10/11/2020, 4:36 PM

## 2020-10-11 NOTE — CV Procedure (Signed)
BLE venous duplex completed. Abnormal finidngs given to Janett Billow, RN at 1530 by [facility] RV, RDMS  Results can be found under chart review under CV PROC. 10/11/2020 3:44 PM Caedyn Tassinari RVT, RDMS

## 2020-10-11 NOTE — Progress Notes (Addendum)
Discussed doppler results with Dr. Venetia Constable. Patient is POD#14 and was cleared to start full anticoagulation. Patient informed of results and discussed AC.

## 2020-10-11 NOTE — Progress Notes (Signed)
Inpatient Rehabilitation  Patient information reviewed and entered into eRehab system by Hisayo Delossantos M. Monserat Prestigiacomo, M.A., CCC/SLP, PPS Coordinator.  Information including medical coding, functional ability and quality indicators will be reviewed and updated through discharge.    

## 2020-10-12 ENCOUNTER — Inpatient Hospital Stay (HOSPITAL_COMMUNITY): Payer: 59 | Admitting: Occupational Therapy

## 2020-10-12 ENCOUNTER — Inpatient Hospital Stay (HOSPITAL_COMMUNITY): Payer: 59

## 2020-10-12 ENCOUNTER — Inpatient Hospital Stay (HOSPITAL_COMMUNITY): Payer: 59 | Admitting: Physical Therapy

## 2020-10-12 MED ORDER — FLEET ENEMA 7-19 GM/118ML RE ENEM
1.0000 | ENEMA | Freq: Every day | RECTAL | Status: DC | PRN
  Administered 2020-10-13 – 2020-10-22 (×3): 1 via RECTAL
  Filled 2020-10-12 (×5): qty 1

## 2020-10-12 MED ORDER — PANTOPRAZOLE SODIUM 40 MG PO TBEC
40.0000 mg | DELAYED_RELEASE_TABLET | Freq: Every day | ORAL | Status: DC
  Administered 2020-10-12 – 2020-10-26 (×15): 40 mg via ORAL
  Filled 2020-10-12 (×15): qty 1

## 2020-10-12 MED ORDER — POLYETHYLENE GLYCOL 3350 17 G PO PACK: 17.0000 g | PACK | Freq: Every day | ORAL | Status: DC | PRN

## 2020-10-12 MED ORDER — CALCIUM CARBONATE ANTACID 500 MG PO CHEW: 1.0000 | CHEWABLE_TABLET | Freq: Three times a day (TID) | ORAL | Status: DC | PRN

## 2020-10-12 MED ORDER — BISACODYL 5 MG PO TBEC
5.0000 mg | DELAYED_RELEASE_TABLET | Freq: Every day | ORAL | Status: DC | PRN
  Filled 2020-10-12: qty 1

## 2020-10-12 MED ORDER — SORBITOL 70 % SOLN
30.0000 mL | Freq: Every day | Status: DC | PRN
  Administered 2020-10-13: 30 mL via ORAL
  Filled 2020-10-12 (×3): qty 30

## 2020-10-12 MED ORDER — PREGABALIN 75 MG PO CAPS
75.0000 mg | ORAL_CAPSULE | Freq: Two times a day (BID) | ORAL | Status: DC
  Administered 2020-10-12 – 2020-10-16 (×9): 75 mg via ORAL
  Filled 2020-10-12 (×9): qty 1

## 2020-10-12 NOTE — Progress Notes (Signed)
[facility] PHYSICAL MEDICINE & REHABILITATION PROGRESS NOTE   Subjective/Complaints:   Pt reports Nucynta didn't end up coming over with him- got it represcribed yesterday by noon.   Also wants to get rid of Gabapentin since not helpful for nerve pain- wants ot try something else- D/w pt options- will try Lyrica 75 mg BID Also has new LLE DVT- common femoral/external iliac- placed on Xarelto by PA yesterday.   Needs to clean out bowels- wants to take time to do so.  Won't allow walking today, but can do OT/transfers/self care today.    ROS:  Pt denies SOB, abd pain, CP, N/V/C/D, and vision changes   Objective:   VAS [place] LOWER EXTREMITY VENOUS (DVT)  Result Date: 10/11/2020  Lower Venous DVT Study Indications: Edema. Other Indications: Patient with recent spinal surgery has been limited in                    mobility since surgery roughly 3 weeks ago (per patient). Comparison Study: No prior studies. Performing Technologist: Rogelia Rohrer  Examination Guidelines: A complete evaluation includes B-mode imaging, spectral Doppler, color Doppler, and power Doppler as needed of all accessible portions of each vessel. Bilateral testing is considered an integral part of a complete examination. Limited examinations for reoccurring indications may be performed as noted. The reflux portion of the exam is performed with the patient in reverse Trendelenburg.  +---------+---------------+---------+-----------+----------+--------------+ RIGHT    CompressibilityPhasicitySpontaneityPropertiesThrombus Aging +---------+---------------+---------+-----------+----------+--------------+ CFV      Full           Yes      Yes                                 +---------+---------------+---------+-----------+----------+--------------+ SFJ      Full                                                        +---------+---------------+---------+-----------+----------+--------------+ FV Prox  Full            Yes      Yes                                 +---------+---------------+---------+-----------+----------+--------------+ FV Mid   Full           Yes      Yes                                 +---------+---------------+---------+-----------+----------+--------------+ FV DistalFull           Yes      Yes                                 +---------+---------------+---------+-----------+----------+--------------+ PFV      Full                                                        +---------+---------------+---------+-----------+----------+--------------+ POP  Full           Yes      Yes                                 +---------+---------------+---------+-----------+----------+--------------+ PTV      Full                                                        +---------+---------------+---------+-----------+----------+--------------+ PERO     Full                                                        +---------+---------------+---------+-----------+----------+--------------+   +----------+---------------+---------+-----------+----------+-----------------+ LEFT      CompressibilityPhasicitySpontaneityPropertiesThrombus Aging    +----------+---------------+---------+-----------+----------+-----------------+ CFV       Partial        Yes      Yes                  Age Indeterminate +----------+---------------+---------+-----------+----------+-----------------+ SFJ       Full                                                           +----------+---------------+---------+-----------+----------+-----------------+ FV Prox   Full           Yes      Yes                                    +----------+---------------+---------+-----------+----------+-----------------+ FV Mid    Full           Yes      Yes                                    +----------+---------------+---------+-----------+----------+-----------------+ FV Distal Full            Yes      Yes                                    +----------+---------------+---------+-----------+----------+-----------------+ PFV       Full                                                           +----------+---------------+---------+-----------+----------+-----------------+ POP       Full           Yes      Yes                                    +----------+---------------+---------+-----------+----------+-----------------+  PTV       Full                                                           +----------+---------------+---------+-----------+----------+-----------------+ PERO      Full                                                           +----------+---------------+---------+-----------+----------+-----------------+ EIV       Partial        Yes      Yes                  Age Indeterminate +----------+---------------+---------+-----------+----------+-----------------+ CIV - DISTFull                                                           +----------+---------------+---------+-----------+----------+-----------------+     Summary: RIGHT: - There is no evidence of deep vein thrombosis in the lower extremity.  - No cystic structure found in the popliteal fossa.  LEFT: - Findings consistent with age indeterminate deep vein thrombosis involving the left common femoral vein, and external iliac vein. - No cystic structure found in the popliteal fossa.  *See table(s) above for measurements and observations. Electronically signed by Curt Jews MD on 10/11/2020 at 8:00:10 PM.    Final    Recent Labs    10/10/20 1713 10/11/20 0427  WBC 10.3 10.6*  HGB 12.3* 12.8*  HCT 37.0* 35.8*  PLT 169 179   Recent Labs    10/10/20 1713 10/11/20 0427  NA  --  135  K  --  4.0  CL  --  100  CO2  --  23  GLUCOSE  --  119*  BUN  --  12  CREATININE 0.85 0.80  CALCIUM  --  8.4*    Intake/Output Summary (Last 24 hours) at 10/12/2020 1029 Last data filed at  10/11/2020 1849 Gross per 24 hour  Intake 120 ml  Output --  Net 120 ml        Physical Exam: Vital Signs Blood pressure 106/72, pulse 79, temperature 98.5 F (36.9 C), temperature source Oral, resp. rate 16, height 5\' 11"  (1.803 m), SpO2 91 %.  General: awake, alert, wearing glasses, sitting up in bed, NAD  HENT: conjugate gaze  Cardiovascular: RRR Pulmonary: CTA B/L- no W/R/R- good air movement  Abdominal: Soft, NT, slightly distended, (+)BS- hypoactive  Musculoskeletal:  Cervical back: Normal range of motion.  Comments: Tender along right buttock and low back with palpation and with RLE lift in bed. Skin: Comments: Back incision is clean and dry, essentially healed/closed- no LE edema seen at all.  Neurological:  Mental Status: He is alert.  Comments: Patient is alert oriented x3 and cooperative with exam. Follows full commands. UE 5/5. RLE 3+/5 HF (pain), 4+ KE and KF, ADF 4/5, APF 5/5. LLE: 4-/5 HF, 4+/5 KE, KF, ADF/APF. Mild sensory loss along anterior right foot extending to  anterolateral leg Psychiatric: appropriate   Assessment/Plan: 1. Functional deficits which require 3+ hours per day of interdisciplinary therapy in a comprehensive inpatient rehab setting.  Physiatrist is providing close team supervision and 24 hour management of active medical problems listed below.  Physiatrist and rehab team continue to assess barriers to discharge/monitor patient progress toward functional and medical goals  Care Tool:  Bathing    Body parts bathed by patient: Right arm,Left arm,Chest,Abdomen,Face   Body parts bathed by helper: Front perineal area,Buttocks,Right lower leg,Left lower leg,Right upper leg,Left upper leg     Bathing assist Assist Level: Total Assistance - Patient < 25%     Upper Body Dressing/Undressing Upper body dressing   What is the patient wearing?: Hospital gown only    Upper body assist Assist Level: Minimal Assistance -  Patient > 75%    Lower Body Dressing/Undressing Lower body dressing      What is the patient wearing?: Hospital gown only     Lower body assist Assist for lower body dressing: Minimal Assistance - Patient > 75%     Toileting Toileting Toileting Activity did not occur (Clothing management and hygiene only): N/A (no void or bm) (did not need to void and in too much pain for toileting at eval)  Toileting assist Assist for toileting: Independent with assistive device Assistive Device Comment: urinal   Transfers Chair/bed transfer  Transfers assist  Chair/bed transfer activity did not occur: Safety/medical concerns (unable due to pain)  Chair/bed transfer assist level: Maximal Assistance - Patient 25 - 49%     Locomotion Ambulation   Ambulation assist   Ambulation activity did not occur: Safety/medical concerns (unable due to pain)          Walk 10 feet activity   Assist           Walk 50 feet activity   Assist           Walk 150 feet activity   Assist           Walk 10 feet on uneven surface  activity   Assist Walk 10 feet on uneven surfaces activity did not occur: Safety/medical concerns (unable due to pain)         Wheelchair     Assist Will patient use wheelchair at discharge?: Yes (for community distances, possibly) Type of Wheelchair: Manual Wheelchair activity did not occur: Safety/medical concerns (unable due to pain)         Wheelchair 50 feet with 2 turns activity    Assist            Wheelchair 150 feet activity     Assist          Blood pressure 106/72, pulse 79, temperature 98.5 F (36.9 C), temperature source Oral, resp. rate 16, height 5\' 11"  (1.803 m), SpO2 91 %.  Medical Problem List and Plan: 1.Lower extremity weakness right greater than left with numbness and decrease in sensationsecondary to failure of anterior and posterior instrumentation L5-S1 fusion from 09/19/2020. Status post  lumbar reexploration and removal of instrumentation L5-S1 repeat decompression both L5 nerve roots with segmental fixation L4 09/27/2020. Pt demonstrates residual right L5 radiculopathy on exam. -Back brace as directed -patient may shower -ELOS/Goals: 7-10 days, mod I goals with PT and OT 2. Antithrombotics: -DVT/anticoagulation:Subcutaneous Lovenox. Check vascular study -antiplatelet therapy: N/A  12/23- has age indeterminate L common femoral and external iliac DVT- Xarelto started x 3 months- will need to not walk x24 hours- then restart therapy  as normal 3. Pain Management:Nucynta 75-100 mg every 6 hoursprn,Robaxin 500 mg every 6 hours as needed Neurontin 400 mg 3 times daily, tylenol prn.  -pt wants to wean off nucynta  12/23- pt needs to continue Nucynta for now- will wean gabapentin to 200 mg TID x 3 days, then 200 mg QHS x 3 days, then off; will replace with Lyrica 75 mg BID , then increase as tolerated.  4. Mood:Provide emotional support -antipsychotic agents: N/A 5. Neuropsych: This patientiscapable of making decisions on hisown behalf. 6. Skin/Wound Care:Routine skin checks 7. Fluids/Electrolytes/Nutrition:Routine in and outs with follow-up chemistrieson admit 8. Hypertension. HCTZ 12.5 mg daily, Cozaar 50 mg daily. Monitor with increased mobility  12/23- BP a little soft- but asymptomatic 9. Hypothyroidism. Synthroid 10. Constipation. Colace twice daily, Movantik 12.5 mg daily, MiraLAX daily as needed -moved bowels last 12/19  12/23- pt asked for enema prn- ordered Fleets- since needs to get cleaned out- will order sorbitol as well prn. Will also make for moderate constipation.  11. BPH. Check PVR's    LOS: 2 days A FACE TO FACE EVALUATION WAS PERFORMED  Alexander Rojas 10/12/2020, 10:29 AM

## 2020-10-12 NOTE — Progress Notes (Signed)
Inpatient Rehabilitation Care Coordinator Assessment and Plan Patient Details  Name: Alexander Rojas MRN: [id number] Date of Birth: [birth year]  Today's Date: 10/12/2020  Hospital Problems: Principal Problem:   Lumbar radiculopathy  Past Medical History:  Past Medical History:  Diagnosis Date  . Back pain   . BPH (benign prostatic hyperplasia)   . Extensor tendon laceration, finger, open wound, initial encounter    left thumb  . Hypertension   . Hypothyroidism   . Macrocytosis   . Numbness and tingling   . Pars defect with spondylolisthesis    Past Surgical History:  Past Surgical History:  Procedure Laterality Date  . ABDOMINAL EXPOSURE N/A 09/19/2020   Procedure: ABDOMINAL EXPOSURE;  Surgeon: Rosetta Posner, MD;  Location: [facility] OR;  Service: Vascular;  Laterality: N/A;  anterior  . ANTERIOR LUMBAR FUSION N/A 09/19/2020   Procedure: Anterior Lumbar Interbody Fusion Lumbar Five-Sacral One, posterior lateral fusion with gill decompression Lumbar Five-Sacral One.;  Surgeon: Eustace Moore, MD;  Location: [facility];  Service: Neurosurgery;  Laterality: N/A;  anterior  . APPLICATION OF INTRAOPERATIVE CT SCAN N/A 09/27/2020   Procedure: APPLICATION OF INTRAOPERATIVE CT SCAN;  Surgeon: Eustace Moore, MD;  Location: [facility];  Service: Neurosurgery;  Laterality: N/A;  . COLONOSCOPY WITH PROPOFOL N/A 12/18/2018   Procedure: COLONOSCOPY WITH BIOPSY;  Surgeon: Lucilla Lame, MD;  Location: [facility];  Service: Endoscopy;  Laterality: N/A;  NEEDS TO STAY FIRST  . ESOPHAGOGASTRODUODENOSCOPY    . ESOPHAGOGASTRODUODENOSCOPY (EGD) WITH PROPOFOL N/A 02/23/2019   Procedure: ESOPHAGOGASTRODUODENOSCOPY (EGD) WITH PROPOFOL;  Surgeon: Lucilla Lame, MD;  Location: [facility] ENDOSCOPY;  Service: Endoscopy;  Laterality: N/A;  . HERNIA REPAIR    . LAMINECTOMY WITH POSTERIOR LATERAL ARTHRODESIS LEVEL 1 N/A 09/19/2020   Procedure: LAMINECTOMY WITH POSTERIOR LATERAL ARTHRODESIS LEVEL 1;  Surgeon: Eustace Moore, MD;   Location: [facility];  Service: Neurosurgery;  Laterality: N/A;  posterior  . LAMINECTOMY WITH POSTERIOR LATERAL ARTHRODESIS LEVEL 2 N/A 09/27/2020   Procedure: Lumbar exploration with Extension of Posterior Instrumentation Lumbar Four to the [place] with Airo;  Surgeon: Eustace Moore, MD;  Location: [facility];  Service: Neurosurgery;  Laterality: N/A;  . LIPOMA EXCISION Right    upper arm  . POLYPECTOMY N/A 12/18/2018   Procedure: POLYPECTOMY;  Surgeon: Lucilla Lame, MD;  Location: [facility];  Service: Endoscopy;  Laterality: N/A;  . TENDON REPAIR Left 11/05/2019   Procedure: LEFT THUMB TENDON REPAIR;  Surgeon: Cindra Presume, MD;  Location: [facility];  Service: Plastics;  Laterality: Left;  Axillary block, MAC  . UMBILICAL HERNIA REPAIR     Social History:  reports that he has never smoked. He has never used smokeless tobacco. He reports previous alcohol use. He reports that he does not use drugs.  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, teaching)   Abuse/Neglect Abuse/Neglect Assessment Can Be Completed: Yes Physical Abuse: Denies Verbal Abuse: Denies Sexual Abuse: Denies Exploitation of patient/patient's resources: Denies Self-Neglect: Denies  Emotional Status Pt's affect, behavior and adjustment status: Patient reports feeling happy and ready to work hard Recent Psychosocial Issues: no Psychiatric History: no Substance Abuse History: no  Patient / Family Perceptions, Expectations & Goals Pt/Family understanding of illness & functional limitations: yes Premorbid pt/family roles/activities:  Indpendent previously, working,  driving Pt/family expectations/goals: MOD I  Recruitment consultant: None Premorbid Home Care/DME Agencies: None Transportation available at discharge: family able to transport Resource referrals recommended: Neuropsychology  Discharge Planning Living Arrangements: Spouse/significant other Support Systems: Spouse/significant other Type of Residence: Private residence Clinical research associate (BB upstairs)) Insurance Resources: Multimedia programmer (specify) ([place] Pharmacologist)) Museum/gallery curator Resources: Employment Financial Screen Referred: No Living Expenses: Medical laboratory scientific officer Management: Patient Does the patient have any problems obtaining your medications?: No Home Management: Independent previously Care Coordinator Barriers to Discharge: Lack of/limited family support Care Coordinator Anticipated Follow Up Needs: HH/OP Expected length of stay: 7-10 Days  Clinical Impression SW entered room, introduced self, explained role and process. Patient very pleasant and complimentary of Dr. Dagoberto Ligas. Patient reports it is his goal to discharge home with his spouse to assist in home. Patient has 12 steps to 2nd level with railings. No additional questions or concerns. SW will continue to follow up.   Dyanne Iha 10/12/2020, 2:37 PM

## 2020-10-12 NOTE — Progress Notes (Signed)
Physical Therapy Session Note  Patient Details  Name: Alexander Rojas MRN: [id number] Date of Birth: 1956-06-26  Today's Date: 10/12/2020 PT Individual Time: 1000-1040 PT Individual Time Calculation (min): 40 min   Short Term Goals: Week 1:  PT Short Term Goal 1 (Week 1): pt will move supine>< sit with min assist PT Short Term Goal 2 (Week 1): pt will move sit>< stand with min assist PT Short Term Goal 3 (Week 1): pt will initiate gait  Skilled Therapeutic Interventions/Progress Updates:    Per MD, pt on bedrest for 24 hours but cleared for transfers. Limit prolonged standing and no gait.  Session focused on bed mobility retraining with log roll technique, rolling with supervision to CGA using bedrails, and sitting tolerance EOB. Discussed and educated on options for OOB as pt reports trialling the TIS w/c yesterday was unsuccessful. He states it felt too restrictive. Open to trialling a reclining back w/c which this PT obtained and set up with elevating legrests. Donned LSO in supine while RN preparing medication. Min assist for log roll to sidelying to sitting EOB to the L (pt reports he always goes to the R at home but wanted to try to the L due to increased pain on the R). Pt moaning in pain to R hip with upright position. Attempted to position Stedy to prepare for transfer but pt unable to tolerate due to pain and request to return to supine. Sat EOB ~ 3 minutes total with close supervision. Pt required mod assist for BLE management but able to maintain trunk. Prefers to leave LSO on in the bed for support.  Therapy Documentation Precautions:  Precautions Precautions: Back Precaution Comments: reviewed back precautions Required Braces or Orthoses: Spinal Brace Spinal Brace: Applied in supine position,Lumbar corset Restrictions Weight Bearing Restrictions: No    Pain:  Reports pain in "R hip" that shoots down his leg and significantly limiting upright tolerance. Medication given by  RN during session.    Therapy/Group: Individual Therapy  Canary Brim Ivory Broad, PT, DPT, CBIS  10/12/2020, 11:18 AM

## 2020-10-12 NOTE — Progress Notes (Signed)
Physical Therapy Session Note  Patient Details  Name: Alexander Rojas MRN: [id number] Date of Birth: 02/25/1956  Today's Date: 10/12/2020 PT Individual Time: 1440-1530 PT Individual Time Calculation (min): 50 min   Short Term Goals: Week 1:  PT Short Term Goal 1 (Week 1): pt will move supine>< sit with min assist PT Short Term Goal 2 (Week 1): pt will move sit>< stand with min assist PT Short Term Goal 3 (Week 1): pt will initiate gait  Skilled Therapeutic Interventions/Progress Updates:    Patient received supine in bed, agreeable to bed level PT. He denies pain at rest, but reports increased pain with spinal flexion and transitional movements. Patient remaining in bed throughout therapy session per MD orders. He was able to use urinal at bedside with supervision for set up. He does report significantly decreased sensation to perineal area. Patient requesting further education regarding sx, anatomy of sciatic nerve distribution, associated muscles and cutaneous nerve distribution and s/s of healing/complications. PT providing patient with associated information and reviewed anatomical pictures to orient patient to anatomy and pathway of nerves associated with patients surgery. PT providing patient with bed-level HEP to complete to tolerance. Patient without further questions or concerns at this time. He remained in bed at end of session with bed alarm on, call light within reach.   Therapy Documentation Precautions:  Precautions Precautions: Back Precaution Comments: reviewed back precautions Required Braces or Orthoses: Spinal Brace Spinal Brace: Applied in supine position,Lumbar corset Restrictions Weight Bearing Restrictions: No   Therapy/Group: Individual Therapy  Karoline Caldwell, PT, DPT, CBIS  10/12/2020, 7:44 AM

## 2020-10-12 NOTE — Progress Notes (Signed)
Occupational Therapy Session Note  Patient Details  Name: Alexander Rojas MRN: [id number] Date of Birth: Aug 19, 1956  Today's Date: 10/12/2020 OT Individual Time: 4446-1901 OT Individual Time Calculation (min): 70 min    Short Term Goals: Week 1:  OT Short Term Goal 1 (Week 1): Pt will perform supine to sit EOB in prep for ADL with Min A OT Short Term Goal 2 (Week 1): Pt will perform sit to stand Mod A in prep for LB ADL OT Short Term Goal 3 (Week 1): Pt will perform BSC/toilet transfer with Mod A and LRAD OT Short Term Goal 4 (Week 1): Pt will don brace with no more than Mod A  Skilled Therapeutic Interventions/Progress Updates:    Pt greeted at time of session supine in bed resting, having questions about his pain meds and questions were answered but recommended speaking with MD/nursing staff for any further questions. Focus of session on bed mobility, transitional movement, and self care. Pt is aware he is on bed rest but able to participate in EOB activity and stand for transfers only. Supine to sit Mod A, pain decreasing to 3/10 with pursed lip breathing, sit to stand at [facility] Min/Mod A and static stand for approx 1 minute while pt urinated in urinal. CGA for standing balance. Pt with most pain in R hip with hip flexion for sit <> stands. Declined transfer to wheelchair d/t fear of increased pain. Returned to supine Min A and performed face/UB bathing supervision at bed level and changed gown Min A. Remainder of session focused on BUE therex with 3# dumbbells for chest press, bicep curls, and flys for reps of 15 (remaining under 10#). Supine resting with call bell in reach all needs met. Staff member also presented pt with PRAFO boot and reviewed with pt purpose/plan, pt politely declining to wear at end of session. Note that lumbar corset remained on throughout session per pt request for comfort.   Therapy Documentation Precautions:  Precautions Precautions: Back Precaution Comments:  reviewed back precautions Required Braces or Orthoses: Spinal Brace Spinal Brace: Applied in supine position,Lumbar corset Restrictions Weight Bearing Restrictions: No     Therapy/Group: Individual Therapy  Viona Gilmore 10/12/2020, 12:03 PM

## 2020-10-12 NOTE — Progress Notes (Signed)
Orthopedic Tech Progress Note Patient Details:  Alexander Rojas 05/15/1956 [id number]  Ortho Devices Type of Ortho Device: Prafo boot/shoe Ortho Device/Splint Interventions: Ordered   Post Interventions Patient Tolerated: Other (comment) Instructions Provided: Other (comment),Care of device,Adjustment of device,Poper ambulation with device   [name] 10/12/2020, 11:31 AM

## 2020-10-12 NOTE — Progress Notes (Signed)
Physical Therapy Session Note  Patient Details  Name: Alexander Rojas MRN: [id number] Date of Birth: 07/16/1956  Today's Date: 10/12/2020 PT Individual Time: 1645-1725 PT Individual Time Calculation (min): 40 min   Short Term Goals: Week 1:  PT Short Term Goal 1 (Week 1): pt will move supine>< sit with min assist PT Short Term Goal 2 (Week 1): pt will move sit>< stand with min assist PT Short Term Goal 3 (Week 1): pt will initiate gait  Skilled Therapeutic Interventions/Progress Updates:   Pt received supine in bed and agreeable to PT. Pt on 24 hr bed rest due to new DVT, Per MD pt  Cleared for bed level therex and transfers as tolerated. PT assisted pt to don LSO supine in bed. Supine>sit transfer with max assist for LE and trunk control. Pt able to tolerate sitting EOB x 3 min until requesting to return to supine due to radiating pain down the RLE. Pt then performed supine UE therex, tricep extension 2 x 12, chest press 2x 15 mid row 2 x 15. All completed with 4# bar weight and level 2 tband. Pt then Performed AAROM LE therex SLR, heel slides, hip abduction each completed 2 x 10 within pain free range. Pt with questions about positioning in bed. PT educated pt on safe positioning to rest in side lying provided that hs is not breaking back precautions to attain position. And informed pt that it is safe to lie in abducted/ER hip position position to alleviate neural tension and reduce pain, as it does not break back precautions. Pt left supine in bed with call bell in reach and all needs met.        Therapy Documentation Precautions:  Precautions Precautions: Back Precaution Comments: reviewed back precautions Required Braces or Orthoses: Spinal Brace Spinal Brace: Applied in supine position,Lumbar corset Restrictions Weight Bearing Restrictions: No Vital Signs: Therapy Vitals Temp: 98.2 F (36.8 C) Pulse Rate: 82 Resp: 17 BP: 110/74 Patient Position (if appropriate): Lying Oxygen  Therapy SpO2: 96 % O2 Device: Room Air Pain:   1/10 R hip. Radiating. Pt repositioned  Therapy/Group: Individual Therapy  [name] 10/12/2020, 5:39 PM  

## 2020-10-12 NOTE — Progress Notes (Signed)
Pt is no walking with therapy for 24 hours. Can do bed side therapy per Dr Dagoberto Ligas,

## 2020-10-12 NOTE — Progress Notes (Signed)
Orthopedic Tech Progress Note Patient Details:  Alexander Rojas 1956-07-20 [id number] Called in resting hand splints Patient ID: [name], male   DOB: May 07, 1956, 64 y.o.   MRN: [id number]   Ellouise Newer 10/12/2020, 11:31 AM

## 2020-10-13 ENCOUNTER — Inpatient Hospital Stay (HOSPITAL_COMMUNITY): Payer: 59 | Admitting: Occupational Therapy

## 2020-10-13 ENCOUNTER — Inpatient Hospital Stay (HOSPITAL_COMMUNITY): Payer: 59

## 2020-10-13 MED ORDER — LIDOCAINE 5 % EX PTCH
2.0000 | MEDICATED_PATCH | CUTANEOUS | Status: DC
  Administered 2020-10-13 – 2020-10-25 (×13): 2 via TRANSDERMAL
  Filled 2020-10-13 (×13): qty 2

## 2020-10-13 NOTE — IPOC Note (Signed)
Overall Plan of Care [facility]) Patient Details Name: Alexander Rojas MRN: [id number] DOB: Aug 24, 1956  Admitting Diagnosis: Lumbar radiculopathy  Hospital Problems: Principal Problem:   Lumbar radiculopathy     Functional Problem List: Nursing Bladder,Bowel,Endurance,Motor,Pain,Safety  PT Balance,Pain,Endurance,Safety,Motor,Sensory  OT Balance,Behavior,Cognition,Endurance,Motor,Pain,Safety  SLP    TR         Basic ADL's: OT Grooming,Bathing,Dressing,Toileting     Advanced  ADL's: OT       Transfers: PT Bed Mobility,Bed to Chair,Car,Furniture  OT Toilet,Tub/Shower     Locomotion: PT Ambulation,Wheelchair Mobility,Stairs     Additional Impairments: OT None  SLP        TR      Anticipated Outcomes Item Anticipated Outcome  Self Feeding no goal  Swallowing      Basic self-care  Supervision  Toileting  Supervision   Bathroom Transfers Supervision  Bowel/Bladder  ,manage bowel and bladder with mod I assist  Transfers  modified independent basic; min assist car  Locomotion  modified independent gait household x 25' wiht LRAD; S gait controlled x 100' wiht LRAD  Communication     Cognition     Pain  Pain level less than 4 on scale of 0-10  Safety/Judgment  remain free of injury, prevent falls with mod I assist   Therapy Plan: PT Intensity: Minimum of 1-2 x/day ,45 to 90 minutes PT Duration Estimated Length of Stay: 10-17 days OT Intensity: Minimum of 1-2 x/day, 45 to 90 minutes OT Frequency: 5 out of 7 days OT Duration/Estimated Length of Stay: 15-17 ( may be shortened pending pain )     Due to the current state of emergency, patients may not be receiving their 3-hours of Medicare-mandated therapy.   Team Interventions: Nursing Interventions Patient/Family Education,Bladder Management,Bowel Management,Psychosocial Support,Medication Management,Discharge Planning,Pain Management  PT interventions Ambulation/gait training,Discharge planning,DME/adaptive  equipment instruction,Functional mobility training,Pain management,Psychosocial support,Therapeutic Activities,Splinting/orthotics,UE/LE Strength taining/ROM,Balance/vestibular training,Community reintegration,Neuromuscular re-education,Patient/family education,Stair training,Therapeutic Exercise,UE/LE Coordination activities,Wheelchair propulsion/positioning  OT Interventions Balance/vestibular training,Discharge planning,Pain management,Self Care/advanced ADL retraining,Therapeutic Activities,UE/LE Coordination activities,Visual/perceptual remediation/compensation,Therapeutic Exercise,Skin care/wound managment,Patient/family education,Functional mobility training,Disease mangement/prevention,Community reintegration,DME/adaptive equipment instruction,Neuromuscular re-education,Psychosocial support,UE/LE Strength taining/ROM,Wheelchair propulsion/positioning  SLP Interventions    TR Interventions    SW/CM Interventions Discharge Planning,Psychosocial Support,Patient/Family Education   Barriers to Discharge MD  Medical stability, Home enviroment access/loayout, Weight bearing restrictions and back and nerve pain- constipation  Nursing      PT      OT Home environment access/layout severe back pain limiting  SLP      SW Lack of/limited family support     Team Discharge Planning: Destination: PT-Home ,OT- Home , SLP-  Projected Follow-up: PT-Home health PT, OT-  Home health OT,24 hour supervision/assistance, SLP-  Projected Equipment Needs: PT-Rolling walker with 5" wheels,Wheelchair cushion (measurements), OT- To be determined, SLP-  Equipment Details: PT-pt may need forearm supports for RW; may need rental wc for community access, OT-  Patient/family involved in discharge planning: PT- Patient,  OT-Patient, SLP-   MD ELOS: 10-17 days Medical Rehab Prognosis:  Good Assessment: Pt is a 64 yr old male with hx of redo L5-S1 fusion with R L5 radiculopathy still s/p repeat decompression. Has  constipation, and continued post op pain- added lyrica and weaning Gabapentin.  Goals Supervision to Mod I by d/c.     See Team Conference Notes for weekly updates to the plan of care

## 2020-10-13 NOTE — Progress Notes (Signed)
[place] PHYSICAL MEDICINE & REHABILITATION PROGRESS NOTE   Subjective/Complaints:   Didn't get enema yesterday- going to do today- looking forward to not being constipated.  No side effects with Lyrica.   Having a lot of pain in hips/back- which is unusual.   Discussed doing lidoderm patches- ordered 9pm-9am- 2 patches for pain control.   ROS:  Pt denies SOB, abd pain, CP, N/V/C/D, and vision changes   Objective:   VAS [place] LOWER EXTREMITY VENOUS (DVT)  Result Date: 10/11/2020  Lower Venous DVT Study Indications: Edema. Other Indications: Patient with recent spinal surgery has been limited in                    mobility since surgery roughly 3 weeks ago (per patient). Comparison Study: No prior studies. Performing Technologist: Rogelia Rohrer  Examination Guidelines: A complete evaluation includes B-mode imaging, spectral Doppler, color Doppler, and power Doppler as needed of all accessible portions of each vessel. Bilateral testing is considered an integral part of a complete examination. Limited examinations for reoccurring indications may be performed as noted. The reflux portion of the exam is performed with the patient in reverse Trendelenburg.  +---------+---------------+---------+-----------+----------+--------------+ RIGHT    CompressibilityPhasicitySpontaneityPropertiesThrombus Aging +---------+---------------+---------+-----------+----------+--------------+ CFV      Full           Yes      Yes                                 +---------+---------------+---------+-----------+----------+--------------+ SFJ      Full                                                        +---------+---------------+---------+-----------+----------+--------------+ FV Prox  Full           Yes      Yes                                 +---------+---------------+---------+-----------+----------+--------------+ FV Mid   Full           Yes      Yes                                  +---------+---------------+---------+-----------+----------+--------------+ FV DistalFull           Yes      Yes                                 +---------+---------------+---------+-----------+----------+--------------+ PFV      Full                                                        +---------+---------------+---------+-----------+----------+--------------+ POP      Full           Yes      Yes                                 +---------+---------------+---------+-----------+----------+--------------+  PTV      Full                                                        +---------+---------------+---------+-----------+----------+--------------+ PERO     Full                                                        +---------+---------------+---------+-----------+----------+--------------+   +----------+---------------+---------+-----------+----------+-----------------+ LEFT      CompressibilityPhasicitySpontaneityPropertiesThrombus Aging    +----------+---------------+---------+-----------+----------+-----------------+ CFV       Partial        Yes      Yes                  Age Indeterminate +----------+---------------+---------+-----------+----------+-----------------+ SFJ       Full                                                           +----------+---------------+---------+-----------+----------+-----------------+ FV Prox   Full           Yes      Yes                                    +----------+---------------+---------+-----------+----------+-----------------+ FV Mid    Full           Yes      Yes                                    +----------+---------------+---------+-----------+----------+-----------------+ FV Distal Full           Yes      Yes                                    +----------+---------------+---------+-----------+----------+-----------------+ PFV       Full                                                            +----------+---------------+---------+-----------+----------+-----------------+ POP       Full           Yes      Yes                                    +----------+---------------+---------+-----------+----------+-----------------+ PTV       Full                                                           +----------+---------------+---------+-----------+----------+-----------------+  PERO      Full                                                           +----------+---------------+---------+-----------+----------+-----------------+ EIV       Partial        Yes      Yes                  Age Indeterminate +----------+---------------+---------+-----------+----------+-----------------+ CIV - DISTFull                                                           +----------+---------------+---------+-----------+----------+-----------------+     Summary: RIGHT: - There is no evidence of deep vein thrombosis in the lower extremity.  - No cystic structure found in the popliteal fossa.  LEFT: - Findings consistent with age indeterminate deep vein thrombosis involving the left common femoral vein, and external iliac vein. - No cystic structure found in the popliteal fossa.  *See table(s) above for measurements and observations. Electronically signed by Curt Jews MD on 10/11/2020 at 8:00:10 PM.    Final    Recent Labs    10/10/20 1713 10/11/20 0427  WBC 10.3 10.6*  HGB 12.3* 12.8*  HCT 37.0* 35.8*  PLT 169 179   Recent Labs    10/10/20 1713 10/11/20 0427  NA  --  135  K  --  4.0  CL  --  100  CO2  --  23  GLUCOSE  --  119*  BUN  --  12  CREATININE 0.85 0.80  CALCIUM  --  8.4*    Intake/Output Summary (Last 24 hours) at 10/13/2020 0952 Last data filed at 10/13/2020 0800 Gross per 24 hour  Intake 720 ml  Output 675 ml  Net 45 ml        Physical Exam: Vital Signs Blood pressure 119/86, pulse 79, temperature 98 F (36.7 C), resp. rate 20, height 5\' 11"   (1.803 m), SpO2 97 %.  General: awake, alert, sitting up I bed; eating breakfast, NAD HENT: conjugate gaze  Cardiovascular: RRR Pulmonary: CTA B/L- no W/R/R- good air movement Abdominal: soft, but a little firm from constipation, NT, slightly distended; hyperactive Musculoskeletal:  Cervical back: Normal range of motion.  Comments: Tender along right buttock and low back with palpation and with RLE lift in bed. Skin: Comments: Back incision is clean and dry, essentially healed/closed- no LE edema seen at all.  Neurological:  Mental Status: He is alert.  Comments: Patient is alert oriented x3 and cooperative with exam. Follows full commands. UE 5/5. RLE 3+/5 HF (pain), 4+ KE and KF, ADF 4/5, APF 5/5. LLE: 4-/5 HF, 4+/5 KE, KF, ADF/APF. Mild sensory loss along anterior right foot extending to anterolateral leg Psychiatric: interactive, appropriate   Assessment/Plan: 1. Functional deficits which require 3+ hours per day of interdisciplinary therapy in a comprehensive inpatient rehab setting.  Physiatrist is providing close team supervision and 24 hour management of active medical problems listed below.  Physiatrist and rehab team continue to assess barriers to discharge/monitor patient progress toward functional and medical goals  Care Tool:  Bathing  Body parts bathed by patient: Right arm,Left arm,Chest,Abdomen,Face   Body parts bathed by helper: Front perineal area,Buttocks,Right lower leg,Left lower leg,Right upper leg,Left upper leg     Bathing assist Assist Level: Total Assistance - Patient < 25%     Upper Body Dressing/Undressing Upper body dressing   What is the patient wearing?: Hospital gown only    Upper body assist Assist Level: Minimal Assistance - Patient > 75%    Lower Body Dressing/Undressing Lower body dressing            Lower body assist Assist for lower body dressing: Minimal Assistance - Patient > 75%      Toileting Toileting Toileting Activity did not occur (Clothing management and hygiene only): N/A (no void or bm) (did not need to void and in too much pain for toileting at eval)  Toileting assist Assist for toileting: Independent with assistive device Assistive Device Comment: urinal   Transfers Chair/bed transfer  Transfers assist  Chair/bed transfer activity did not occur: Safety/medical concerns (unable due to pain)  Chair/bed transfer assist level: Maximal Assistance - Patient 25 - 49%     Locomotion Ambulation   Ambulation assist   Ambulation activity did not occur: Safety/medical concerns (unable due to pain)          Walk 10 feet activity   Assist           Walk 50 feet activity   Assist           Walk 150 feet activity   Assist           Walk 10 feet on uneven surface  activity   Assist Walk 10 feet on uneven surfaces activity did not occur: Safety/medical concerns (unable due to pain)         Wheelchair     Assist Will patient use wheelchair at discharge?: Yes (for community distances, possibly) Type of Wheelchair: Manual Wheelchair activity did not occur: Safety/medical concerns (unable due to pain)         Wheelchair 50 feet with 2 turns activity    Assist            Wheelchair 150 feet activity     Assist          Blood pressure 119/86, pulse 79, temperature 98 F (36.7 C), resp. rate 20, height 5\' 11"  (1.803 m), SpO2 97 %.  Medical Problem List and Plan: 1.Lower extremity weakness right greater than left with numbness and decrease in sensationsecondary to failure of anterior and posterior instrumentation L5-S1 fusion from 09/19/2020. Status post lumbar reexploration and removal of instrumentation L5-S1 repeat decompression both L5 nerve roots with segmental fixation L4 09/27/2020. Pt demonstrates residual right L5 radiculopathy on exam. -Back brace as directed -patient  may shower -ELOS/Goals: 7-10 days, mod I goals with PT and OT 2. Antithrombotics: -DVT/anticoagulation:Subcutaneous Lovenox. 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 Pain Management:Nucynta 75-100 mg every 6 hoursprn,Robaxin 500 mg every 6 hours as needed Neurontin 400 mg 3 times daily, tylenol prn.  -pt wants to wean off nucynta  12/23- pt needs to continue Nucynta for now- will wean gabapentin to 200 mg TID x 3 days, then 200 mg QHS x 3 days, then off; will replace with Lyrica 75 mg BID , then increase as tolerated.   12/24- no side  effects from Lyrica- will continue- and add Lidoderm patches 2 patches 9pm to 9am 4. Mood:Provide emotional support -antipsychotic agents: N/A 5. Neuropsych: This patientiscapable of making decisions on hisown behalf. 6. Skin/Wound Care:Routine skin checks 7. Fluids/Electrolytes/Nutrition:Routine in and outs with follow-up chemistrieson admit 8. Hypertension. HCTZ 12.5 mg daily, Cozaar 50 mg daily. Monitor with increased mobility  12/24- BP 119/86- cont' regimen 9. Hypothyroidism. Synthroid 10. Constipation. Colace twice daily, Movantik 12.5 mg daily, MiraLAX daily as needed -moved bowels last 12/19  12/23- pt asked for enema prn- ordered Fleets- since needs to get cleaned out- will order sorbitol as well prn. Will also make for moderate constipation.  12/24- planning on sorbitol/enema today after therapt.   11. BPH. Check PVR's    LOS: 3 days A FACE TO FACE EVALUATION WAS PERFORMED  Alexander Rojas 10/13/2020, 9:52 AM

## 2020-10-13 NOTE — Progress Notes (Signed)
Physical Therapy Session Note  Patient Details  Name: Alexander Rojas MRN: [id number] Date of Birth: 12-22-1955  Today's Date: 10/13/2020 PT Individual Time: 1108-1202 PT Individual Time Calculation (min): 54 min   Short Term Goals: Week 1:  PT Short Term Goal 1 (Week 1): pt will move supine>< sit with min assist PT Short Term Goal 2 (Week 1): pt will move sit>< stand with min assist PT Short Term Goal 3 (Week 1): pt will initiate gait  Skilled Therapeutic Interventions/Progress Updates:    Patient in supine wearing brace, but not TEDs.  MD in the room initially.  Patient reports pain located posterior to R hip joint.  Noted not tender to palp and reports it radiates down the leg worst with transitional movements.  Rolling to L with S using rail, side to sit with min A to help with trunk.  Patient sit to stand to [place] and moved to w/c via [place].  Seated in w/c pushed to therapy gym.  Reported using EVA walker before for ambulation, but not available.  Patient in parallel bars ambulated 2x length of the bars down and back with min A, min A to pull up to stand in bars.  Patient negotiated 8 (3") steps with min A step to sequence with cues for technique.  Discussed with pt and wife need for mod I goals as pt will be alone at home at d/c.  Does not need to negotiate steps for home entry, but would like to be able to do it anyway.  Patient assisted to ortho gym in w/c and demonstrated car transfer to simulated height of mid size SUV [employer]).  Patient sit to stand and stepped to car, seated on seat with S and placed 6" step under his feet to scoot back.  Patient in too much pain to attempt to pivot hips for bringing legs into car so returned to standing with min A.  Patient ambulated with RW 30' with min A increased time and heavy UE support.  Patient in w/c assisted to room.  Requesting to toilet, but unable to use urinal in w/c.  Sit to stand and ambulated x 8' with RW to bed.  Sit to side with  mod A for LE's.  Rolling to remove brace with S using rail.  PAtient left in supine with urinal placed and wife in the room.   Therapy Documentation Precautions:  Precautions Precautions: Back Precaution Comments: reviewed back precautions Required Braces or Orthoses: Spinal Brace Spinal Brace: Applied in supine position,Lumbar corset Restrictions Weight Bearing Restrictions: No Pain: Pain Assessment Pain Score: 5  Pain Location: Hip Pain Orientation: Right;Posterior Pain Descriptors / Indicators: Shooting;Sharp Pain Onset: With Activity Pain Intervention(s): Repositioned;Rest    Therapy/Group: Individual Therapy  Alexander Rojas  Alexander Rojas, PT 10/13/2020, 12:13 PM

## 2020-10-13 NOTE — Progress Notes (Signed)
Occupational Therapy Session Note  Patient Details  Name: Alexander Rojas MRN: [id number] Date of Birth: 1956-08-28  Today's Date: 10/13/2020 OT Individual Time: 3709-6438 and 3818-4037 OT Individual Time Calculation (min): 72 min and 53 min   Short Term Goals: Week 1:  OT Short Term Goal 1 (Week 1): Pt will perform supine to sit EOB in prep for ADL with Min A OT Short Term Goal 2 (Week 1): Pt will perform sit to stand Mod A in prep for LB ADL OT Short Term Goal 3 (Week 1): Pt will perform BSC/toilet transfer with Mod A and LRAD OT Short Term Goal 4 (Week 1): Pt will don brace with no more than Mod A   Skilled Therapeutic Interventions/Progress Updates:    Pt greeted at time of session supine in bed resting, no pain initially but did have pain later in session with mobility, rest breaks and repositioning PRN. Pt premedicated. Confirmed with MD this am, pt no longer on bed rest. Donned lumbar brace in supine rolling L/R per pt request as he prefers to don/doff this way instead of EOB, supine to sit Min A, sit to stand at [facility] as well and transferred to wheelchair. Set up at sink and performed oral hygiene set up. Time spent at beginning of session for pressure relief, offloading R hip, and finding good position in wheelchair. Declined shaving this am. Transported to gym via wheelchair for time management and performed sit to stands Min A, cues for hand placement with marching in place and lateral weight shifts to improve standing balance/tolerance. Pt then ambulated approx 15-20 feet in hall with RW with close wheelchair follow and rest break half way through. Stedy back to bed Min/Mod d/t fatigue and sit to supine Mod A. Call bell in reach all needs met.   Session 2: Pt greeted at time of session supine in bed resting with wife present, agreeable to OT session and mild pain later in sessoin with mobility with repositioning and rest breaks provided PRN. Supine to sit Min A and donned brace  EOB today instead of supine. Stedy transfer to wheelchair CGA/Min overall. Transported to gym dependent for time management, performed standing activities at [facility] to challenge static and dynamic standing balance with both no UE support and at times unilateral support, pt happy with progress, standing for 4-5 minute intervals. Attempted shoulder ROM seated but comfort improved in standing, performed shoulder rolls, scapular elevation, and scap retractions all in standing for 1x15 with static holds when appropriate to open chest and promote upright posture as well as relax and decrease tension. Pt initially wanted to walk in hall short distance, but ended up walking approx 50 feet from outside gym to his room CGA/Min with RW and transfers stand to sit in same manner. Sit to supine Mod A. Alarm on call bell inreach.    Therapy Documentation Precautions:  Precautions Precautions: Back Precaution Comments: reviewed back precautions Required Braces or Orthoses: Spinal Brace Spinal Brace: Applied in supine position,Lumbar corset Restrictions Weight Bearing Restrictions: No     Therapy/Group: Individual Therapy  Viona Gilmore 10/13/2020, 10:03 AM

## 2020-10-14 ENCOUNTER — Inpatient Hospital Stay (HOSPITAL_COMMUNITY): Payer: 59 | Admitting: Occupational Therapy

## 2020-10-14 NOTE — Progress Notes (Signed)
[facility] PHYSICAL MEDICINE & REHABILITATION PROGRESS NOTE   Subjective/Complaints:   No issues overnite, pain control is better, bowels are regulated   ROS:  Pt denies SOB, abd pain, CP, N/V/C/D, and vision changes   Objective:   No results found. No results for input(s): WBC, HGB, HCT, PLT in the last 72 hours. No results for input(s): NA, K, CL, CO2, GLUCOSE, BUN, CREATININE, CALCIUM in the last 72 hours.  Intake/Output Summary (Last 24 hours) at 10/14/2020 0859 Last data filed at 10/14/2020 0542 Gross per 24 hour  Intake 600 ml  Output 470 ml  Net 130 ml        Physical Exam: Vital Signs Blood pressure 104/75, pulse 81, temperature 98.6 F (37 C), temperature source Oral, resp. rate 18, height 5\' 11"  (1.803 m), SpO2 94 %.   General: No acute distress Mood and affect are appropriate Heart: Regular rate and rhythm no rubs murmurs or extra sounds Lungs: Clear to auscultation, breathing unlabored, no rales or wheezes Abdomen: Positive bowel sounds, soft nontender to palpation, nondistended Extremities: No clubbing, cyanosis, or edema Skin: No evidence of breakdown, no evidence of rash abd incision CDI Comments: Tender along right buttock and low back with palpation and with RLE lift in bed. Skin: Comments: Back incision is clean and dry, essentially healed/closed- no LE edema seen at all.  Neurological:  Mental Status: He is alert.  Comments: Patient is alert oriented x3 and cooperative with exam. Follows full commands. UE 5/5. RLE 3+/5 HF (pain), 4+ KE and KF, ADF 3/5, APF 4/5. LLE: 4-/5 HF, 4+/5 KE, KF, ADF/APF. Mild sensory loss along anterior right foot extending to anterolateral leg Psychiatric: interactive, appropriate   Assessment/Plan: 1. Functional deficits which require 3+ hours per day of interdisciplinary therapy in a comprehensive inpatient rehab setting.  Physiatrist is providing close team supervision and 24 hour management of  active medical problems listed below.  Physiatrist and rehab team continue to assess barriers to discharge/monitor patient progress toward functional and medical goals  Care Tool:  Bathing    Body parts bathed by patient: Right arm,Left arm,Chest,Abdomen,Face   Body parts bathed by helper: Front perineal area,Buttocks,Right lower leg,Left lower leg,Right upper leg,Left upper leg     Bathing assist Assist Level: Total Assistance - Patient < 25%     Upper Body Dressing/Undressing Upper body dressing   What is the patient wearing?: Hospital gown only    Upper body assist Assist Level: Minimal Assistance - Patient > 75%    Lower Body Dressing/Undressing Lower body dressing            Lower body assist Assist for lower body dressing: Minimal Assistance - Patient > 75%     Toileting Toileting Toileting Activity did not occur (Clothing management and hygiene only): N/A (no void or bm) (did not need to void and in too much pain for toileting at eval)  Toileting assist Assist for toileting: Independent with assistive device Assistive Device Comment: urinal   Transfers Chair/bed transfer  Transfers assist  Chair/bed transfer activity did not occur: Safety/medical concerns (unable due to pain)  Chair/bed transfer assist level: Dependent - mechanical lift (Stedy)     Locomotion Ambulation   Ambulation assist   Ambulation activity did not occur: Safety/medical concerns (unable due to pain)  Assist level: Minimal Assistance - Patient > 75% Assistive device: Walker-rolling Max distance: 30'   Walk 10 feet activity   Assist     Assist level: Minimal Assistance - Patient >  75% Assistive device: Walker-rolling   Walk 50 feet activity   Assist           Walk 150 feet activity   Assist           Walk 10 feet on uneven surface  activity   Assist Walk 10 feet on uneven surfaces activity did not occur: Safety/medical concerns (unable due to  pain)         Wheelchair     Assist Will patient use wheelchair at discharge?: No Type of Wheelchair: Manual Wheelchair activity did not occur: Safety/medical concerns (unable due to pain)         Wheelchair 50 feet with 2 turns activity    Assist            Wheelchair 150 feet activity     Assist          Blood pressure 104/75, pulse 81, temperature 98.6 F (37 C), temperature source Oral, resp. rate 18, height 5\' 11"  (1.803 m), SpO2 94 %.  Medical Problem List and Plan: 1.Lower extremity weakness right greater than left with numbness and decrease in sensationsecondary to failure of anterior and posterior instrumentation L5-S1 fusion from 09/19/2020. Status post lumbar reexploration and removal of instrumentation L5-S1 repeat decompression both L5 nerve roots with segmental fixation L4 09/27/2020. Pt demonstrates residual right L5 radiculopathy on exam. -Back brace as directed -patient may shower -ELOS/Goals: 7-10 days, mod I goals with PT and OT 2. Antithrombotics: -DVT/anticoagulation:Subcutaneous Lovenox. 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 Pain Management:Nucynta 75-100 mg every 6 hoursprn,Robaxin 500 mg every 6 hours as needed Neurontin 400 mg 3 times daily, tylenol prn.  -pt wants to wean off nucynta  12/23- pt needs to continue Nucynta for now- will wean gabapentin to 200 mg TID x 3 days, then 200 mg QHS x 3 days, then off; will replace with Lyrica 75 mg BID , then increase as tolerated.   12/24- no side effects from Lyrica- will continue- and add Lidoderm patches 2 patches 9pm to 9am 12/25 pain well controlled  4. Mood:Provide emotional support -antipsychotic agents: N/A 5. Neuropsych: This  patientiscapable of making decisions on hisown behalf. 6. Skin/Wound Care:Routine skin checks 7. Fluids/Electrolytes/Nutrition:Routine in and outs with follow-up chemistrieson admit 8. Hypertension. HCTZ 12.5 mg daily, Cozaar 50 mg daily. Monitor with increased mobility   Vitals:   10/13/20 1955 10/14/20 0537  BP: (!) 139/107 104/75  Pulse: 67 81  Resp: 20 18  Temp: 98.3 F (36.8 C) 98.6 F (37 C)  SpO2: 94% 94%  controlled with some lability  9. Hypothyroidism. Synthroid 10. Constipation. Colace twice daily, Movantik 12.5 mg daily, MiraLAX daily as needed -moved bowels last 12/19  12/23- pt asked for enema prn- ordered Fleets- since needs to get cleaned out- will order sorbitol as well prn. Will also make for moderate constipation.  12/25 had medium type 6 stool on 12/24 11. BPH. Check PVR's    LOS: 4 days A FACE TO FACE EVALUATION WAS PERFORMED  Charlett Blake 10/14/2020, 8:59 AM

## 2020-10-15 ENCOUNTER — Inpatient Hospital Stay (HOSPITAL_COMMUNITY): Payer: 59 | Admitting: Occupational Therapy

## 2020-10-15 ENCOUNTER — Inpatient Hospital Stay (HOSPITAL_COMMUNITY): Payer: 59

## 2020-10-15 NOTE — Progress Notes (Signed)
Physical Therapy Session Note  Patient Details  Name: Alexander Rojas MRN: [id number] Date of Birth: Nov 04, 1955  Today's Date: 10/15/2020 PT Individual Time: [phone] and 1447-1530 PT Individual Time Calculation (min): 54 min and 43 min  Short Term Goals: Week 1:  PT Short Term Goal 1 (Week 1): pt will move supine>< sit with min assist PT Short Term Goal 2 (Week 1): pt will move sit>< stand with min assist PT Short Term Goal 3 (Week 1): pt will initiate gait  Skilled Therapeutic Interventions/Progress Updates:     Pt received seated in [facility] and agrees to therapy. Reports 10/10 pain in low back and radiating to R leg that is exacerbated during therapy session. PT alerts RN who provides pt with pain meds midway through session.  WC transport to gym for time management. Pt performs stand pivot transfer to mat table with CGA and cues for hand placement and body mechanics. Pt has sharp increase in pain upon transitioning from standing to sitting. After rest break to allow for pain alleviation, pt practices sit<>stand transfers for improved mechanics and efficiency. PT provides CGA and pt demos tendency to keep weight too far posterior to COG but is able to complete transfers. PT cues on "nose over toes" techniques ad pt attempts 2-3 more transfers with improving mechanics. Pt ambulates x150' with CGA with slow, antalgic gait pattern. Upon return to mat pt in significant pain and PT assists pt with sit to supine (modA) with logrolling technique and pt rests in supine, verbalizing slow improvement in symptoms. Once pain subsides, pt performs supine to sit with minA and stand pivot back to chair with CGA. Pt left seated in WC with all needs within reach.  2nd Session:Pt received seated in [facility] and agrees to therapy. Reports that he was in a lot of pain with OT session just earlier, but pain has improved and pt is eager to participate. PT adjusts pt's leg rests for improved fit and to allow pt's knees to  extend as much as possible when leg rests elevated. Pt reports improved comfort with adjustment. Pt performs sit to stand with CGA, and ambulates >500' with RW and CGA, taking standing rest breaks frequently, but not requiring seated rest break. Pt reports that back pain not really bothering him during ambulation and symptoms in general seem to be improving. PT provides verbal and tactile cues for upright gaze to improve posture and balance, and relaxing shoulders and arms for energy conservation and improved body mechanics. Upon return to room, pt performs sit to supine with modA. Left supine in bed with all needs within reach.  Therapy Documentation Precautions:  Precautions Precautions: Back Precaution Comments: reviewed back precautions Required Braces or Orthoses: Spinal Brace Spinal Brace: Applied in supine position,Lumbar corset Restrictions Weight Bearing Restrictions: No    Therapy/Group: Individual Therapy  Breck Coons, PT, DPT 10/15/2020, 3:36 PM

## 2020-10-15 NOTE — Progress Notes (Signed)
Occupational Therapy Session Note  Patient Details  Name: Alexander Rojas MRN: [id number] Date of Birth: [birth year]  Today's Date: 10/15/2020 OT Individual Time: 1100-1215 and 1330-1430 OT Individual Time Calculation (min): 75 min and 60 min    Short Term Goals: Week 1:  OT Short Term Goal 1 (Week 1): Pt will perform supine to sit EOB in prep for ADL with Min A OT Short Term Goal 2 (Week 1): Pt will perform sit to stand Mod A in prep for LB ADL OT Short Term Goal 3 (Week 1): Pt will perform BSC/toilet transfer with Mod A and LRAD OT Short Term Goal 4 (Week 1): Pt will don brace with no more than Mod A  Skilled Therapeutic Interventions/Progress Updates:    Visit  1: Pain:  No pain at rest, but with movement - sit to stand, transfers, sitting on BSC over toilet and sitting on plastic shower chair - pt in extreme pain. 10/10. Resolved after rest.  Pt received in recliner, asking for a shower. Discussed at length if pt would feel "up to it" based on pain levels and low blood pressure in the past. Recommended we use the stedy lift but pt felt he would be able to ambulate.   Overall, his physical strength is quite good as he could rise to stand from recliner, elevated toilet, shower seat and arm chair all with min A to RW and ambulated in and out of the bathroom with min A.  BUT he was in considerable discomfort.  Doffed TED hose and brace once sitting. Pt showered and used long sponge to reach feet. Lateral leans for therapist to rinse his bottom.  Pt gripping seat tightly -  Due to pain and discomfort in sitting. He often was afraid to let go but with encouragement had him let go with one hand to wash himself. Pt assisted with drying off, donned brace with A.  Ambulated to arm chair to dress.  Doffed brace again so pt could don shirt and then his brace. Therapist placed thigh high TEDs on pt.  Pt introduced to reacher and he was able to get pants over feet with 50% help, stood with RW and therapist  donned over hips with pt assisting slightly.  Returned to [employer]. Placed wc cushion in recliner for more support.  Pt resting, stating he was comfortable. chair alarm on and all needs met.   Visit 2: Pain: pt in recliner and in some pain but did want to go outside to get fresh air and sun. He was very concerned with how uncomfortable the wc is.  Obtained a roho cushion for pt to try and to give him more seat height, placed roho overtop other wc cushion.  Pt transferred to wc with min A with RW but was automatically very uncomfortable.   Made some adjustments with pillows. Despite discomfort, pt did want to go outside and the change of scenery may help with distraction from pain.  Pt transported (he had too much pain to self propel) to front of hospital entrance.  Pt sat outside in sun and conversed about many different topics.  He was most comfortable with leg rests removed.  Pt then taken back to room and agreed to stay in wc until PT session.  Leg rests off and pt resting with all needs met.   Therapy Documentation Precautions:  Precautions Precautions: Back Precaution Comments: reviewed back precautions Required Braces or Orthoses: Spinal Brace Spinal Brace: Applied in supine position,Lumbar corset Restrictions  Weight Bearing Restrictions: No    Vital Signs: Therapy Vitals Temp: 98.5 F (36.9 C) Temp Source: Oral Pulse Rate: 79 Resp: 18 BP: 99/71 Patient Position (if appropriate): Lying Oxygen Therapy SpO2: 98 % O2 Device: Room Air Pain:   ADL: ADL Upper Body Bathing: Supervision/safety Where Assessed-Upper Body Bathing: Bed level Lower Body Bathing: Dependent Where Assessed-Lower Body Bathing: Bed level Upper Body Dressing: Minimal assistance Lower Body Dressing: Dependent Where Assessed-Lower Body Dressing: Bed level Toileting: Not assessed Toilet Transfer: Not assessed   Therapy/Group: Individual Therapy  [name] 10/15/2020, 7:58 AM

## 2020-10-16 ENCOUNTER — Inpatient Hospital Stay (HOSPITAL_COMMUNITY): Payer: 59 | Admitting: Occupational Therapy

## 2020-10-16 ENCOUNTER — Inpatient Hospital Stay (HOSPITAL_COMMUNITY): Payer: 59

## 2020-10-16 DIAGNOSIS — K5903 Drug induced constipation: Secondary | ICD-10-CM

## 2020-10-16 DIAGNOSIS — R300 Dysuria: Secondary | ICD-10-CM

## 2020-10-16 DIAGNOSIS — M792 Neuralgia and neuritis, unspecified: Secondary | ICD-10-CM

## 2020-10-16 DIAGNOSIS — D72829 Elevated white blood cell count, unspecified: Secondary | ICD-10-CM

## 2020-10-16 LAB — URINALYSIS, COMPLETE (UACMP) WITH MICROSCOPIC
Bilirubin Urine: NEGATIVE
Glucose, UA: NEGATIVE mg/dL
Hgb urine dipstick: NEGATIVE
Ketones, ur: 5 mg/dL — AB
Nitrite: NEGATIVE
Protein, ur: 30 mg/dL — AB
Specific Gravity, Urine: 1.028 (ref 1.005–1.030)
pH: 5 (ref 5.0–8.0)

## 2020-10-16 MED ORDER — PREGABALIN 50 MG PO CAPS
100.0000 mg | ORAL_CAPSULE | Freq: Three times a day (TID) | ORAL | Status: DC
  Administered 2020-10-16 – 2020-10-23 (×21): 100 mg via ORAL
  Filled 2020-10-16 (×21): qty 2

## 2020-10-16 MED ORDER — NALOXEGOL OXALATE 25 MG PO TABS
25.0000 mg | ORAL_TABLET | Freq: Every day | ORAL | Status: DC
  Administered 2020-10-17 – 2020-10-26 (×10): 25 mg via ORAL
  Filled 2020-10-16 (×11): qty 1

## 2020-10-16 MED ORDER — LOSARTAN POTASSIUM 25 MG PO TABS
25.0000 mg | ORAL_TABLET | Freq: Every day | ORAL | Status: DC
Start: 2020-10-17 — End: 2020-10-17
  Administered 2020-10-17: 25 mg via ORAL
  Filled 2020-10-16: qty 1

## 2020-10-16 NOTE — Progress Notes (Signed)
[place] PHYSICAL MEDICINE & REHABILITATION PROGRESS NOTE   Subjective/Complaints: Patient seen working with therapy this morning.  He states he slept well overnight.  He has questions regarding blood pressure, urine analysis, pain.  He explains bowel patterns as well-discussed toileting with patient and therapies.  ROS: + Dysuria, constipation, right-sided radicular pain. Denies CP, SOB, N/V/D  Objective:   No results found. No results for input(s): WBC, HGB, HCT, PLT in the last 72 hours. No results for input(s): NA, K, CL, CO2, GLUCOSE, BUN, CREATININE, CALCIUM in the last 72 hours.  Intake/Output Summary (Last 24 hours) at 10/16/2020 1051 Last data filed at 10/16/2020 0800 Gross per 24 hour  Intake 720 ml  Output 1200 ml  Net -480 ml        Physical Exam: Vital Signs Blood pressure 100/67, pulse 68, temperature 98.3 F (36.8 C), resp. rate 18, height 5\' 11"  (1.803 m), SpO2 96 %. Constitutional: No distress . Vital signs reviewed. HENT: Normocephalic.  Atraumatic. Eyes: EOMI. No discharge. Cardiovascular: No JVD.  RRR. Respiratory: Normal effort.  No stridor.  Bilateral clear to auscultation. GI: Non-distended.  BS +. Skin: Warm and dry.  Incision with brace covering. Psych: Normal mood.  Normal behavior. Musc: No edema in extremities.  No tenderness in extremities. Neuro: Alert Motor: Bilateral upper extremities: >/4/5 throughout Right lower extremity: >/ 3/5 proximal distal  Assessment/Plan: 1. Functional deficits which require 3+ hours per day of interdisciplinary therapy in a comprehensive inpatient rehab setting.  Physiatrist is providing close team supervision and 24 hour management of active medical problems listed below.  Physiatrist and rehab team continue to assess barriers to discharge/monitor patient progress toward functional and medical goals  Care Tool:  Bathing    Body parts bathed by patient: Right arm,Left arm,Chest,Abdomen,Face,Front  perineal area,Left upper leg,Right upper leg,Right lower leg,Left lower leg   Body parts bathed by helper: Buttocks     Bathing assist Assist Level: Minimal Assistance - Patient > 75%     Upper Body Dressing/Undressing Upper body dressing   What is the patient wearing?: Pull over shirt,Orthosis Orthosis activity level: Performed by helper  Upper body assist Assist Level: Moderate Assistance - Patient 50 - 74%    Lower Body Dressing/Undressing Lower body dressing      What is the patient wearing?: Pants     Lower body assist Assist for lower body dressing: Minimal Assistance - Patient > 75%     Toileting Toileting Toileting Activity did not occur (Clothing management and hygiene only): N/A (no void or bm) (did not need to void and in too much pain for toileting at eval)  Toileting assist Assist for toileting: Maximal Assistance - Patient 25 - 49% Assistive Device Comment: urinal sitting in chair   Transfers Chair/bed transfer  Transfers assist  Chair/bed transfer activity did not occur: Safety/medical concerns (unable due to pain)  Chair/bed transfer assist level: Contact Guard/Touching assist     Locomotion Ambulation   Ambulation assist   Ambulation activity did not occur: Safety/medical concerns (unable due to pain)  Assist level: Contact Guard/Touching assist Assistive device: Walker-rolling Max distance: >500'   Walk 10 feet activity   Assist     Assist level: Contact Guard/Touching assist Assistive device: Walker-rolling   Walk 50 feet activity   Assist    Assist level: Contact Guard/Touching assist Assistive device: Walker-rolling    Walk 150 feet activity   Assist    Assist level: Contact Guard/Touching assist Assistive device: Walker-rolling  Walk 10 feet on uneven surface  activity   Assist Walk 10 feet on uneven surfaces activity did not occur: Safety/medical concerns (unable due to pain)          Wheelchair     Assist Will patient use wheelchair at discharge?: No Type of Wheelchair: Manual Wheelchair activity did not occur: Safety/medical concerns (unable due to pain)         Wheelchair 50 feet with 2 turns activity    Assist            Wheelchair 150 feet activity     Assist          Medical Problem List and Plan: 1.Lower extremity weakness right greater than left with numbness and decrease in sensationsecondary to failure of anterior and posterior instrumentation L5-S1 fusion from 09/19/2020. Status post lumbar reexploration and removal of instrumentation L5-S1 repeat decompression both L5 nerve roots with segmental fixation L4 09/27/2020. Pt demonstrates residual right L5 radiculopathy on exam. -Back brace as directed  Continue CIR 2. 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 Pain Management:Nucynta 75-100 mg every 6 hoursprn,Robaxin 500 mg every 6 hours as needed Neurontin 400 mg 3 times daily, tylenol prn.  -pt wants to wean off nucynta  DC'd on 12/27  Lyrica 75 mg BID , increased to 100 3 times daily on 12/27  Added Lidoderm patches 2 patches 9pm to 9am 4. Mood:Provide emotional support -antipsychotic agents: N/A 5. Neuropsych: This patientiscapable of making decisions on hisown behalf. 6. Skin/Wound Care:Routine skin checks 7. Fluids/Electrolytes/Nutrition:Routine in and outs 8. Hypertension. HCTZ 12.5 mg daily DC'd on 12/28 due to soft blood pressures  Cozaar 50 mg daily, decreased to 25 on 12/28  Monitor with increased mobility   Vitals:   10/15/20 2013 10/16/20 0520  BP: 108/74 100/67  Pulse: 76 68  Resp: 18 18  Temp: 99.2 F (37.3 C) 98.3 F (36.8 C)  SpO2: 97% 96%   9. Hypothyroidism. Synthroid 10. Drug-induced constipation. Colace twice  daily  Movantik 12.5 mg daily, increase to 20, 12/27  MiraLAX daily as needed 11. BPH with dysuria  UA/urine culture ordered. 12.  Leukocytosis  WBC 10.6 on 12/22  Afebrile  See #11  LOS: 6 days A FACE TO FACE EVALUATION WAS PERFORMED  Jaimarie Rapozo Karis Juba 10/16/2020, 10:51 AM

## 2020-10-16 NOTE — Progress Notes (Signed)
Physical Therapy Session Note  Patient Details  Name: Alexander Rojas MRN: [id number] Date of Birth: 1955/10/28  Today's Date: 10/16/2020 PT Individual Time: 0930-1030 PT Individual Time Calculation (min): 60 min   Short Term Goals: Week 1:  PT Short Term Goal 1 (Week 1): pt will move supine>< sit with min assist PT Short Term Goal 2 (Week 1): pt will move sit>< stand with min assist PT Short Term Goal 3 (Week 1): pt will initiate gait  Skilled Therapeutic Interventions/Progress Updates:    Patient seated in recliner and RN finishing getting medication.  Reports pain at 2/10 at rest, up to 3/10 R hip with ambulation.  Patient sit to stand S to RW.  Ambulated to therapy gym with S to CGA noted decreased L heel strike and some crossing over of L.  In parallel bars for side stepping, forward and backward gait with 2 vs 1 UE support, standing heel taps over stick alternating, stepping over and back stick, then side stepping over stick with UE support.  Patient single leg stance while circling ball under feet R then L 10x CW/CCW.  Performed standing heel raises x 10.  Patient ambulated with SPC on L and min A x 70'.  Standing at steps taps to first step x 5, forward steps ups to 1st step x 10 alternating feet.  Patient ambulated to room with RW and CGA.  Attempted to have pt sitting up in recliner, but c/o pain so assisted with mod A sit to supine and left with call bell and needs in reach.   Therapy Documentation Precautions:  Precautions Precautions: Back Precaution Comments: reviewed back precautions Required Braces or Orthoses: Spinal Brace Spinal Brace: Applied in supine position,Lumbar corset Restrictions Weight Bearing Restrictions: No Pain: Pain Assessment Pain Scale: 0-10 Pain Score: 2  Pain Type: Acute pain Pain Location: Hip Pain Descriptors / Indicators: Aching Pain Frequency: Intermittent Pain Onset: With Activity Patients Stated Pain Goal: 0 Pain Intervention(s): Medication  (See eMAR)    Therapy/Group: Individual Therapy  Reginia Naas  [place], PT 10/16/2020, 8:29 AM

## 2020-10-16 NOTE — Progress Notes (Signed)
Occupational Therapy Session Note  Patient Details  Name: ISMAEEL ARVELO MRN: [id number] Date of Birth: 1956-08-02  Today's Date: 10/16/2020 OT Individual Time: 0177-9390 OT Individual Time Calculation (min): 54 min    Short Term Goals: Week 1:  OT Short Term Goal 1 (Week 1): Pt will perform supine to sit EOB in prep for ADL with Min A OT Short Term Goal 2 (Week 1): Pt will perform sit to stand Mod A in prep for LB ADL OT Short Term Goal 3 (Week 1): Pt will perform BSC/toilet transfer with Mod A and LRAD OT Short Term Goal 4 (Week 1): Pt will don brace with no more than Mod A   Skilled Therapeutic Interventions/Progress Updates:    Pt greeted at time of session supine in bed resting agreeable to OT session, no pain in resting but later 30/09 pain with certain movements and pt premedicated approx 15 minutes before therapy. Rest breaks and repositioning PRN. Supine to sit Min A, ambulated room <> gym CGA with RW with rest breaks for pain onset. Extensive discussion throughout session regarding home set up, DC planning, amount of assist needed at time of DC, etc. Pt performed several dynamic standing activities with no UE support and occasional unilateral support for reaching overhead and out of BOS. Multiple attempts at repositioning with R hip pain onset, standing providing best relief. Ambulated back to room CGA with RW and extended time, taking long way to provide more walking and decrease back stiffness. Sit to supine Min A. Call bell in reach all needs met.    Therapy Documentation Precautions:  Precautions Precautions: Back Precaution Comments: reviewed back precautions Required Braces or Orthoses: Spinal Brace Spinal Brace: Applied in supine position,Lumbar corset Restrictions Weight Bearing Restrictions: No     Therapy/Group: Individual Therapy  Viona Gilmore 10/16/2020, 2:32 PM

## 2020-10-16 NOTE — Progress Notes (Signed)
Transitions of Care Pharmacist Note  Alexander Rojas is a 64 y.o. male that has been diagnosed with DVT and will be prescribed Xarelto (rivaroxaban) at discharge.   Patient Education: I provided the following education on Rivaroxaban to the patient: How to take the medication Described what the medication is Signs of bleeding Signs/symptoms of VTE and stroke  Answered their questions  Discharge Medications Plan: The patient is not interested in filling their discharge medications with the Transitions of Care pharmacy at this time.   If the patient later decides they would like to have the Transitions of Care pharmacy fill their discharge medications, please call us at [phone]. Thank you.    Thank you,   Lamar Sprinkles, PharmD PGY1 Pharmacy Resident 10/16/2020 5:54 PM  October 16, 2020

## 2020-10-16 NOTE — Progress Notes (Signed)
Occupational Therapy Session Note  Patient Details  Name: Alexander Rojas MRN: [id number] Date of Birth: September 09, 1956  Today's Date: 10/16/2020 OT Individual Time: 4854-6270 OT Individual Time Calculation (min): 61 min    Short Term Goals: Week 1:  OT Short Term Goal 1 (Week 1): Pt will perform supine to sit EOB in prep for ADL with Min A OT Short Term Goal 2 (Week 1): Pt will perform sit to stand Mod A in prep for LB ADL OT Short Term Goal 3 (Week 1): Pt will perform BSC/toilet transfer with Mod A and LRAD OT Short Term Goal 4 (Week 1): Pt will don brace with no more than Mod A  Skilled Therapeutic Interventions/Progress Updates:    Pt in bed to start session, reporting that pain meds were given about 7:15.  He was agreeable to getting up to the EOB and for working on dressing.  Alexander Rojas was able to roll to the left side and then transition to sitting with min to mod assist.  Increased pain was reported when sitting up with pt demonstrating sharp 10/10 pain in this right hip.  He was unable to let go with his UEs to work on UB dressing sitting EOB.  Dan Humphreys was presented with pt completing stand pivot transfer to the high back wheelchair in hopes of getting relief.  With sitting in the wheelchair and having back support, he was still not able to tolerate letting go of the arm rests secondary to the increased pain.  Nursing made aware of this and tylenol as well as muscle relaxer was given.  Therapist provided total assist for donning the back corset in sitting prior to shirt, since pt could not tolerate lifting his arms.  Once the brace was in place and meds were given, he demonstrated a reduction in his pain.  He donned the pullover shirt over his head, but needed assist for pulling it down in the back.  He was able to complete sit to stand with min guard assist and maintain standing from the wheelchair when surgeon came in the room with very little pain.  He sat back down and worked on donning pants  with use of the reacher and min instructional cueing.  Pain increased again in sitting, but he was able to complete donning them with overall min guard assist.  Took him over to the sink in the wheelchair for oral care, but he was not able to tolerate sitting secondary to pain.  Completed transfer with the RW to the recliner with min guard and he reported feeling less pain once sitting.  Setup was given for oral hygiene from the recliner.  He was left with the call button and phone in reach.  Pain much improved while sitting in the recliner.    Therapy Documentation Precautions:  Precautions Precautions: Back Precaution Comments: reviewed back precautions Required Braces or Orthoses: Spinal Brace Spinal Brace: Applied in supine position,Lumbar corset Restrictions Weight Bearing Restrictions: No Pain: Pain Assessment Pain Scale: 0-10 Pain Score: 10-Worst pain ever Pain Type: Acute pain Pain Location: Hip Pain Orientation: Right Pain Descriptors / Indicators: Aching Pain Frequency: Intermittent Pain Onset: With Activity Patients Stated Pain Goal: 0 Pain Intervention(s): Medication (See eMAR) ADL: See Care Tool Section for some details of mobility and selfcare  Therapy/Group: Individual Therapy  Orpha Dain OTR/L 10/16/2020, 9:26 AM

## 2020-10-17 ENCOUNTER — Inpatient Hospital Stay (HOSPITAL_COMMUNITY): Payer: 59 | Admitting: Occupational Therapy

## 2020-10-17 ENCOUNTER — Inpatient Hospital Stay (HOSPITAL_COMMUNITY): Payer: 59

## 2020-10-17 MED ORDER — CEPHALEXIN 250 MG PO CAPS
500.0000 mg | ORAL_CAPSULE | Freq: Two times a day (BID) | ORAL | Status: DC
  Administered 2020-10-17 – 2020-10-18 (×3): 500 mg via ORAL
  Filled 2020-10-17 (×3): qty 2

## 2020-10-17 MED ORDER — METHOCARBAMOL 750 MG PO TABS
750.0000 mg | ORAL_TABLET | Freq: Three times a day (TID) | ORAL | Status: DC
  Administered 2020-10-17 – 2020-10-27 (×30): 750 mg via ORAL
  Filled 2020-10-17 (×30): qty 1

## 2020-10-17 NOTE — Progress Notes (Signed)
Occupational Therapy Session Note  Patient Details  Name: Alexander Rojas MRN: [id number] Date of Birth: 1955/11/20  Today's Date: 10/17/2020 OT Individual Time: 3300-7622 OT Individual Time Calculation (min): 54 min    Short Term Goals: Week 1:  OT Short Term Goal 1 (Week 1): Pt will perform supine to sit EOB in prep for ADL with Min A OT Short Term Goal 2 (Week 1): Pt will perform sit to stand Mod A in prep for LB ADL OT Short Term Goal 3 (Week 1): Pt will perform BSC/toilet transfer with Mod A and LRAD OT Short Term Goal 4 (Week 1): Pt will don brace with no more than Mod A  Skilled Therapeutic Interventions/Progress Updates:    Patient in bed, alert and ready for therapy session.  He is wearing LSO at this time and states that pain meds were provided approx 1 hour prior - he notes pain with transitional movement and occ spasms in right proximal LE.  Side lying to sitting edge of bed with min A.  He is able to stand from bed surface and ambulate on unit with RW CGA.  SPT with RW to/from bed, arm chair, recliner CGA.  He tolerated standing for reaching (BITS) activity for 5 minutes x 2 - good carryover of back precautions and no LOB.  Reviewed and practiced options to take pressure off of lower back in stance (staggered stance, placing right toe on surface - good understanding and safety with these strategies)  Reviewed and practiced stretch of hamstrings on elevated surface to limit forward bend (significant tightness noted)   Completed long walk on unit with brief standing rest breaks.  He notes increased pain and weakness in legs due to fatigue.  Transferred back to bed for comfort with min A to bring both legs into supine position.  Reviewed positioning and leg AROM options in supine with good carryover.  He remained in bed at close of session.  He missed 21 minutes due to fatigue.    Therapy Documentation Precautions:  Precautions Precautions: Back Precaution Comments: reviewed back  precautions Required Braces or Orthoses: Spinal Brace Spinal Brace: Applied in supine position,Lumbar corset Restrictions Weight Bearing Restrictions: No   Therapy/Group: Individual Therapy  Barrie Lyme 10/17/2020, 7:48 AM

## 2020-10-17 NOTE — Progress Notes (Signed)
[facility] PHYSICAL MEDICINE & REHABILITATION PROGRESS NOTE   Subjective/Complaints: Pain continues to severely limit sessions. Discussed scheduling Robaxin and he is agreeable. Increased dose and reduced frequency so he is not woken at night. UA+ and patient has dysuria: keflex started Discussed results of lumbar XR with him- stable  ROS: + Dysuria, constipation, right-sided radicular pain. Denies CP, SOB, N/V/D  Objective:   DG Lumbar Spine 1 View  Result Date: 10/17/2020 CLINICAL DATA:  Status post lumbar fusion EXAM: LUMBAR SPINE - 1 VIEW COMPARISON:  October 04, 2020 FINDINGS: There is again noted posterior screw and plate fixation from L4 through S2 with stable positioning of the pedicle screws. The pedicle screws at L5 track along the superior aspect of the L5 vertebral body. There is anterior fixation at L5 and S1, stable with discectomy at this level. There is no acute fracture. There is stable 16 mm of anterolisthesis of L5 on S1 with anterior and posterior fixation in this area. No other spondylolisthesis. There is disc space narrowing at T12-L1 and L1-2, stable. IMPRESSION: Postoperative change from L4-S2, stable, with anterior fusion and fixation at L5 and S1. Stable 16 mm of anterolisthesis of L5 on S1 with surgical fixation in this area. No new spondylolisthesis. No acute fracture. 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 Electronically Signed   By: Bretta BangWilliam  Woodruff III M.D.   On: 10/17/2020 08:43   No results for input(s): WBC, HGB, HCT, PLT in the last 72 hours. No results for input(s): NA, K, CL, CO2, GLUCOSE, BUN, CREATININE, CALCIUM in the last 72 hours.  Intake/Output Summary (Last 24 hours) at 10/17/2020 1145 Last data filed at 10/17/2020 0700 Gross per 24 hour  Intake 555 ml  Output 790 ml  Net -235 ml        Physical Exam: Vital Signs Blood pressure 111/77, pulse 78, temperature 99.5 F (37.5 C), temperature source Oral, resp.  rate 17, height 5\' 11"  (1.803 m), SpO2 93 %. Gen: no distress, normal appearing HEENT: oral mucosa pink and moist, NCAT Cardio: Reg rate Chest: normal effort, normal rate of breathing Abd: soft, non-distended Ext: no edema Skin: Warm and dry.  Incision with brace covering. Psych: Normal mood.  Normal behavior. Musc: No edema in extremities.  No tenderness in extremities. Neuro: Alert Motor: Bilateral upper extremities: >/4/5 throughout Right lower extremity: >/ 3/5 proximal distal  Assessment/Plan: 1. Functional deficits which require 3+ hours per day of interdisciplinary therapy in a comprehensive inpatient rehab setting.  Physiatrist is providing close team supervision and 24 hour management of active medical problems listed below.  Physiatrist and rehab team continue to assess barriers to discharge/monitor patient progress toward functional and medical goals  Care Tool:  Bathing    Body parts bathed by patient: Right arm,Left arm,Chest,Abdomen,Face,Front perineal area,Left upper leg,Right upper leg,Right lower leg,Left lower leg   Body parts bathed by helper: Buttocks     Bathing assist Assist Level: Minimal Assistance - Patient > 75%     Upper Body Dressing/Undressing Upper body dressing   What is the patient wearing?: Pull over shirt,Orthosis Orthosis activity level: Performed by helper  Upper body assist Assist Level: Moderate Assistance - Patient 50 - 74%    Lower Body Dressing/Undressing Lower body dressing      What is the patient wearing?: Pants     Lower body assist Assist for lower body dressing: Minimal Assistance - Patient > 75%     Toileting Toileting Toileting Activity did not occur Recruitment consultant(Clothing  management and hygiene only): N/A (no void or bm) (did not need to void and in too much pain for toileting at eval)  Toileting assist Assist for toileting: Maximal Assistance - Patient 25 - 49% Assistive Device Comment: urinal sitting in chair    Transfers Chair/bed transfer  Transfers assist  Chair/bed transfer activity did not occur: Safety/medical concerns (unable due to pain)  Chair/bed transfer assist level: Contact Guard/Touching assist     Locomotion Ambulation   Ambulation assist   Ambulation activity did not occur: Safety/medical concerns (unable due to pain)  Assist level: Minimal Assistance - Patient > 75% Assistive device: Cane-straight Max distance: 200'   Walk 10 feet activity   Assist     Assist level: Minimal Assistance - Patient > 75% Assistive device: Cane-straight   Walk 50 feet activity   Assist    Assist level: Contact Guard/Touching assist Assistive device: Walker-rolling    Walk 150 feet activity   Assist    Assist level: Contact Guard/Touching assist Assistive device: Walker-rolling    Walk 10 feet on uneven surface  activity   Assist Walk 10 feet on uneven surfaces activity did not occur: Safety/medical concerns (unable due to pain)         Wheelchair     Assist Will patient use wheelchair at discharge?: No Type of Wheelchair: Manual Wheelchair activity did not occur: Safety/medical concerns (unable due to pain)         Wheelchair 50 feet with 2 turns activity    Assist            Wheelchair 150 feet activity     Assist          Medical Problem List and Plan: 1.Lower extremity weakness right greater than left with numbness and decrease in sensationsecondary to failure of anterior and posterior instrumentation L5-S1 fusion from 09/19/2020. Status post lumbar reexploration and removal of instrumentation L5-S1 repeat decompression both L5 nerve roots with segmental fixation L4 09/27/2020. Pt demonstrates residual right L5 radiculopathy on exam. -Back brace as directed  Continue CIR  -Interdisciplinary Team Conference today  2. Antithrombotics: -DVT/anticoagulation:Age indeterminate left, small and external iliac DVT-  Xarelto started x 3 months -antiplatelet therapy: N/A 3. Pain Management:Nucynta 75-100 mg every 6 hoursprn,Robaxin 500 mg every 6 hours as needed Neurontin 400 mg 3 times daily, tylenol prn.  -pt wants to wean off nucynta  DC'd on 12/27  Lyrica 75 mg BID , increased to 100 3 times daily on 12/27  Added Lidoderm patches 2 patches 9pm to 9am  12/28: increase Robaxin to 750mg  TID.  4. Mood:Provide emotional support -antipsychotic agents: N/A 5. Neuropsych: This patientiscapable of making decisions on hisown behalf. 6. Skin/Wound Care:Routine skin checks 7. Fluids/Electrolytes/Nutrition:Routine in and outs 8. Hypertension. HCTZ 12.5 mg daily DC'd on 12/28 due to soft blood pressures  Cozaar 50 mg daily, decreased to 25 on 12/28  12/28: d/c Cozaar given soft BP  Monitor with increased mobility   Vitals:   10/16/20 1324 10/17/20 0518  BP: 105/75 111/77  Pulse: 72 78  Resp: 17   Temp: 98.2 F (36.8 C) 99.5 F (37.5 C)  SpO2: 96% 93%   9. Hypothyroidism. Synthroid 10. Drug-induced constipation. Colace twice daily  Movantik 12.5 mg daily, increase to 20, 12/27  MiraLAX daily as needed 11. BPH with dysuria  UA+, keflex ordered, UC pending.  12.  Leukocytosis  WBC 10.6 on 12/22  Afebrile  See #11  LOS: 7 days A FACE TO FACE EVALUATION  WAS PERFORMED  Horton Chin 10/17/2020, 11:45 AM

## 2020-10-17 NOTE — Patient Care Conference (Signed)
Inpatient RehabilitationTeam Conference and Plan of Care Update Date: 10/17/2020   Time: 11:39 AM    Patient Name: Alexander Rojas      Medical Record Number: [id number]  Date of Birth: Jul 15, 1956 Sex: Male         Room/Bed: 4M12C/4M12C-01 Payor Info: Payor: [employer] / Plan: [employer] / Product Type: *No Product type* /    Admit Date/Time:  10/10/2020  4:26 PM  Primary Diagnosis:  Lumbar radiculopathy  Hospital Problems: Principal Problem:   Lumbar radiculopathy Active Problems:   Leukocytosis   Dysuria   Drug induced constipation   Neuropathic pain    Expected Discharge Date: Expected Discharge Date: 10/26/20  Team Members Present: Physician leading conference: Dr. Sula Soda Care Coodinator Present: Kennyth Arnold, RN, BSN, CRRN;Christina [place], BSW Nurse Present: Willey Blade, RN PT Present: [name], PT OT Present: Earleen Newport, OT PPS Coordinator present : Edson Snowball, [place], SLP     Current Status/Progress Goal Weekly Team Focus  Bowel/Bladder   continent of B/B, last BM-12/27  remain continent of B/B  assess Q shift and PRN   Swallow/Nutrition/ Hydration             ADL's   ADLs pending pain levels, LB bathe/dress Min/Mod with AE for back precautions, transfers CGA with RW, difficulty with flexed position, standing provides relief, pain limiting  Supervision for now, would like to progress to Mod I if able  pain management and positioning, transitional movement, LB ADL with AE, standing balance/tolerance, ADL transfers   Mobility   mod A sit to supine, CGA otherwise, amb w/RW >200'  mod I overall household  LE strength, balance, gait   Communication             Safety/Cognition/ Behavioral Observations            Pain   No c/o pain  pain<3  Assess pain  Q shift  and PRN   Skin   incisions- abdomen, back  healing properly  assess Q shift and PRN     Discharge Planning:  Discharging home with spouse  (intermittent-evenings only) Multi-level home, B&B upstairs   Team Discussion: Positive for UTI, started Keflex, increased Lyrica recently, decrease Cozaar as tolerated. Continent B/B, reports constipation, relieved by enema. Discussed medications and patient would like Robaxin 1000 mg QID. Would like Lyrica increased as well. OT reports patient depends on pain as to how well the patient will do. Can be a min assist with ADL's or mod to max assist. Patient was able to shower today. PT reports patient is working to find pain relieving positions. Sit to supine is a mod assist. Goal is Mod I for household mobility. Patient on target to meet rehab goals: yes  *See Care Plan and progress notes for long and short-term goals.   Revisions to Treatment Plan:  Lyrica has already been increased once, will not increase again just yet. Will consider increasing Robaxin due to the pain he describes appears to be more muscular and spasm like. Will decrease Cozaar as needed  Teaching Needs: Family education Wound care Medication management   Current Barriers to Discharge: Decreased caregiver support, Home enviroment access/layout, Wound care, Lack of/limited family support, Weight bearing restrictions, Medication compliance, Behavior and pain.  Possible Resolutions to Barriers: Continue current medications, wound care management, pain care management, provide emotional support to patient and family.     Medical Summary Current Status: Pain continues to limit therapy, HTN, dysuria  Barriers to Discharge: Medical stability  Barriers to Discharge Comments: Pain continues to limit therapy, HTN, dysuria Possible Resolutions to Barriers/Weekly Focus: Uptitrate Lyrica and Robaxin as tolerated, decrease Cozaar as tolerated, started Keflex for UTI   Continued Need for Acute Rehabilitation Level of Care: The patient requires daily medical management by a physician with specialized training in physical medicine  and rehabilitation for the following reasons: Direction of a multidisciplinary physical rehabilitation program to maximize functional independence : Yes Medical management of patient stability for increased activity during participation in an intensive rehabilitation regime.: Yes Analysis of laboratory values and/or radiology reports with any subsequent need for medication adjustment and/or medical intervention. : Yes   I attest that I was present, lead the team conference, and concur with the assessment and plan of the team.   Tennis Must 10/17/2020, 5:46 PM

## 2020-10-17 NOTE — Progress Notes (Signed)
Patient ID: Alexander Rojas, male   DOB: Aug 31, 1956, 64 y.o.   MRN: [id number] Team Conference Report to Patient/Family  Team Conference discussion was reviewed with the patient and caregiver, including goals, any changes in plan of care and target discharge date.  Patient and caregiver express understanding and are in agreement.  The patient has a target discharge date of 10/26/20.  Andria Rhein 10/17/2020, 1:22 PM

## 2020-10-17 NOTE — Progress Notes (Signed)
Occupational Therapy Session Note  Patient Details  Name: Alexander Rojas MRN: [id number] Date of Birth: 1956-03-06  Today's Date: 10/17/2020 OT Individual Time: 7681-1572 OT Individual Time Calculation (min): 45 min  and Today's Date: 10/17/2020 OT Missed Time: 15 Minutes Missed Time Reason: X-Ray   Short Term Goals: Week 1:  OT Short Term Goal 1 (Week 1): Pt will perform supine to sit EOB in prep for ADL with Min A OT Short Term Goal 2 (Week 1): Pt will perform sit to stand Mod A in prep for LB ADL OT Short Term Goal 3 (Week 1): Pt will perform BSC/toilet transfer with Mod A and LRAD OT Short Term Goal 4 (Week 1): Pt will don brace with no more than Mod A   Skilled Therapeutic Interventions/Progress Updates:    Therapist present at time of session, pt not in room and out for X-ray. Missed 15 minutes of OT d/t Xray. Pt wanting to get OOB and shower this am, supine to sit Min A and donned brace in same manner. Note minimal discomfort at beginning of session sitting EOB, pt ambulating to bathroom and transferred to shower seat with CGA. Shower seat felt to be too low, switched out for [facility] with towels to elevate. Pt performed shower level bathing with Mod A overall, increased assistance noted today as he had severe 10/10 pain with seated position and performed remainder in static standing. Shower rushed today and increased assistance provided d/t patient's high pain level and need to get out of shower. Attempted various positions in shower, but no relief. Remainder of shower in standing with pt adhering to back precautions. Stedy transfer out of shower > bed level d/t pain. Donned pants Max A today and shirt Min and brace in same manner. Note pain severely limiting today and prevented full participation. Once pt standing in [place] and able to relax, pain resolved. Wanting to sit up in recliner, Stedy transfer to recliner and set up on roho cushion, alarm on call bell in reach all needs met.    Therapy Documentation Precautions:  Precautions Precautions: Back Precaution Comments: reviewed back precautions Required Braces or Orthoses: Spinal Brace Spinal Brace: Applied in supine position,Lumbar corset Restrictions Weight Bearing Restrictions: No     Therapy/Group: Individual Therapy  Viona Gilmore 10/17/2020, 12:23 PM

## 2020-10-17 NOTE — Progress Notes (Signed)
Physical Therapy Session Note  Patient Details  Name: Alexander Rojas MRN: [id number] Date of Birth: 1956-01-26  Today's Date: 10/17/2020 PT Individual Time: 1018-1130 PT Individual Time Calculation (min): 72 min   Short Term Goals: Week 1:  PT Short Term Goal 1 (Week 1): pt will move supine>< sit with min assist PT Short Term Goal 2 (Week 1): pt will move sit>< stand with min assist PT Short Term Goal 3 (Week 1): pt will initiate gait  Skilled Therapeutic Interventions/Progress Updates:    Patient seated in recliner, resting pain 1/10.  Applied thigh hi TEDS while in chair.  Patient sit to stand S increased time and achieved through trunk extension trying to get away from R hip pain.  Patient ambulated x 39' with RW and CGA with increasing R hip pain.  Attempted to set up on Nu Step, but pt unable to tolerate.  Sit to side on mat mod A for LE's.  LE's on green therapy ball, pt performed lateral trunk rotation, lower abdominal and hip flexion pulling ball up towards hips, stretching (passive) to hip extensors, hamstrings and hip internal rotator 2 x 30 sec holds, bridging on ball 2 x 10.  Rolled into prone the assisted to quadruped for alternate arm then leg lifts x 5 and rocking forward and back on knees.  Down to L sidelying performed clamshell hip abduction on R x 10.  Side to sit with min A for trunk.  Patient sit to stand S and ambulated to car to attempt car transfer, but pt reported unable due to R LE/hip pain.  Patient seated in armchair and work to reduce trunk extension utilizing posterior pelvic tilt, then R hip external rotation and flexion PROM, then hip flexion PROM with improved pain.  Discussed need for stretching and pain relieving positions to allow pt to work on mobility skills.  Patient ambulated to room x 100' with RW and S.  Sit to supine on bed mod A for LE's.  Left with pillow under knees and pt with bed controls and call bell in reach.    Therapy Documentation Precautions:   Precautions Precautions: Back Precaution Comments: reviewed back precautions Required Braces or Orthoses: Spinal Brace Spinal Brace: Applied in supine position,Lumbar corset Restrictions Weight Bearing Restrictions: No Pain: Pain Assessment Pain Scale: 0-10 Pain Score: 9  Pain Type: Acute pain Pain Location: Hip Pain Orientation: Right Pain Descriptors / Indicators: Sharp;Throbbing Pain Frequency: Constant Pain Onset: With Activity Pain Intervention(s): Other (Comment) (stretching)    Therapy/Group: Individual Therapy  [name]  [name], PT 10/17/2020, 10:51 AM

## 2020-10-18 ENCOUNTER — Inpatient Hospital Stay (HOSPITAL_COMMUNITY): Payer: 59

## 2020-10-18 ENCOUNTER — Inpatient Hospital Stay (HOSPITAL_COMMUNITY): Payer: 59 | Admitting: Occupational Therapy

## 2020-10-18 ENCOUNTER — Inpatient Hospital Stay (HOSPITAL_COMMUNITY): Payer: 59 | Admitting: Physical Therapy

## 2020-10-18 LAB — URINE CULTURE: Culture: 20000 — AB

## 2020-10-18 MED ORDER — TAPENTADOL HCL 50 MG PO TABS
150.0000 mg | ORAL_TABLET | Freq: Three times a day (TID) | ORAL | Status: DC
  Administered 2020-10-18 – 2020-10-27 (×38): 150 mg via ORAL
  Filled 2020-10-18 (×37): qty 3

## 2020-10-18 MED ORDER — SULFAMETHOXAZOLE-TRIMETHOPRIM 800-160 MG PO TABS
1.0000 | ORAL_TABLET | Freq: Two times a day (BID) | ORAL | Status: AC
  Administered 2020-10-18 – 2020-10-22 (×9): 1 via ORAL
  Filled 2020-10-18 (×9): qty 1

## 2020-10-18 MED ORDER — CEPHALEXIN 250 MG PO CAPS
500.0000 mg | ORAL_CAPSULE | Freq: Two times a day (BID) | ORAL | Status: DC
  Administered 2020-10-18: 500 mg via ORAL

## 2020-10-18 NOTE — Progress Notes (Addendum)
[place] PHYSICAL MEDICINE & REHABILITATION PROGRESS NOTE   Subjective/Complaints:  Pt asking to increase Nucynta dose.  Says having dysuria x5 days-  LBM 3 days ago- plans on going today- can use enema if needed, since still on bedpan at this time for bowels.   Changed Keflex to Bactirm due to resistance in Urine Cx.    ROS:  Pt denies SOB, abd pain, CP, N/V/C/D, and vision changes   Objective:   DG Lumbar Spine 1 View  Result Date: 10/17/2020 CLINICAL DATA:  Status post lumbar fusion EXAM: LUMBAR SPINE - 1 VIEW COMPARISON:  October 04, 2020 FINDINGS: There is again noted posterior screw and plate fixation from L4 through S2 with stable positioning of the pedicle screws. The pedicle screws at L5 track along the superior aspect of the L5 vertebral body. There is anterior fixation at L5 and S1, stable with discectomy at this level. There is no acute fracture. There is stable 16 mm of anterolisthesis of L5 on S1 with anterior and posterior fixation in this area. No other spondylolisthesis. There is disc space narrowing at T12-L1 and L1-2, stable. IMPRESSION: Postoperative change from L4-S2, stable, with anterior fusion and fixation at L5 and S1. Stable 16 mm of anterolisthesis of L5 on S1 with surgical fixation in this area. No new spondylolisthesis. No acute fracture. 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 Electronically Signed   By: Lowella Grip III M.D.   On: 10/17/2020 08:43   No results for input(s): WBC, HGB, HCT, PLT in the last 72 hours. No results for input(s): NA, K, CL, CO2, GLUCOSE, BUN, CREATININE, CALCIUM in the last 72 hours.  Intake/Output Summary (Last 24 hours) at 10/18/2020 1018 Last data filed at 10/18/2020 0738 Gross per 24 hour  Intake 545 ml  Output 1020 ml  Net -475 ml        Physical Exam: Vital Signs Blood pressure 118/78, pulse 63, temperature 98.1 F (36.7 C), temperature source Oral, resp. rate 17, height 5\' 11"   (1.803 m), SpO2 99 %. Gen: sitting up in bed- appropriate, NAD HEENT: oral mucosa pink and moist, NCAT Cardio: RRR Chest: CTA B/L- no W/R/R- good air movement Abd: soft, trace distension, NT, hypoactive BS Ext: no edema Skin: Warm and dry.  Incision was almost healed Psych: appropriate. Musc: No edema in extremities.  No tenderness in extremities. Neuro: Alert Motor: Bilateral upper extremities: >/4/5 throughout Right lower extremity: >/ 3/5 proximal distal  Assessment/Plan: 1. Functional deficits which require 3+ hours per day of interdisciplinary therapy in a comprehensive inpatient rehab setting.  Physiatrist is providing close team supervision and 24 hour management of active medical problems listed below.  Physiatrist and rehab team continue to assess barriers to discharge/monitor patient progress toward functional and medical goals  Care Tool:  Bathing    Body parts bathed by patient: Right arm,Left arm,Chest,Abdomen,Face,Front perineal area   Body parts bathed by helper: Buttocks,Right lower leg,Left lower leg     Bathing assist Assist Level: Moderate Assistance - Patient 50 - 74%     Upper Body Dressing/Undressing Upper body dressing   What is the patient wearing?: Pull over shirt,Orthosis Orthosis activity level: Performed by helper  Upper body assist Assist Level: Minimal Assistance - Patient > 75%    Lower Body Dressing/Undressing Lower body dressing      What is the patient wearing?: Pants     Lower body assist Assist for lower body dressing: Maximal Assistance - Patient 25 - 49% (limited  by pain)     Toileting Toileting Toileting Activity did not occur (Clothing management and hygiene only): N/A (no void or bm) (did not need to void and in too much pain for toileting at eval)  Toileting assist Assist for toileting: Maximal Assistance - Patient 25 - 49% Assistive Device Comment: urinal sitting in chair   Transfers Chair/bed transfer  Transfers  assist  Chair/bed transfer activity did not occur: Safety/medical concerns (unable due to pain)  Chair/bed transfer assist level: Contact Guard/Touching assist     Locomotion Ambulation   Ambulation assist   Ambulation activity did not occur: Safety/medical concerns (unable due to pain)  Assist level: Contact Guard/Touching assist Assistive device: Walker-rolling Max distance: 100'   Walk 10 feet activity   Assist     Assist level: Contact Guard/Touching assist Assistive device: Walker-rolling   Walk 50 feet activity   Assist    Assist level: Contact Guard/Touching assist Assistive device: Walker-rolling    Walk 150 feet activity   Assist    Assist level: Contact Guard/Touching assist Assistive device: Walker-rolling    Walk 10 feet on uneven surface  activity   Assist Walk 10 feet on uneven surfaces activity did not occur: Safety/medical concerns (unable due to pain)         Wheelchair     Assist Will patient use wheelchair at discharge?: No Type of Wheelchair: Manual Wheelchair activity did not occur: Safety/medical concerns (unable due to pain)         Wheelchair 50 feet with 2 turns activity    Assist            Wheelchair 150 feet activity     Assist          Medical Problem List and Plan: 1.Lower extremity weakness right greater than left with numbness and decrease in sensationsecondary to failure of anterior and posterior instrumentation L5-S1 fusion from 09/19/2020. Status post lumbar reexploration and removal of instrumentation L5-S1 repeat decompression both L5 nerve roots with segmental fixation L4 09/27/2020. Pt demonstrates residual right L5 radiculopathy on exam. -Back brace as directed  Continue CIR  -Interdisciplinary Team Conference today  2. 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 Pain Management:Nucynta 75-100 mg every 6 hoursprn,Robaxin 500 mg every 6 hours as needed Neurontin 400 mg 3 times daily, tylenol prn.   Lyrica 75 mg BID , increased to 100 3 times daily on 12/27  Added Lidoderm patches 2 patches 9pm to 9am  12/28: increase Robaxin to 750mg  TID.  12/29- asking to increase Nucynta will increase to 150 mg QID for pain.   4. Mood:Provide emotional support -antipsychotic agents: N/A 5. Neuropsych: This patientiscapable of making decisions on hisown behalf. 6. Skin/Wound Care:Routine skin checks 7. Fluids/Electrolytes/Nutrition:Routine in and outs 8. Hypertension. HCTZ 12.5 mg daily DC'd on 12/28 due to soft blood pressures  Cozaar 50 mg daily, decreased to 25 on 12/28  12/28: d/c Cozaar given soft BP  Monitor with increased mobility   Vitals:   10/17/20 1920 10/18/20 0604  BP: 106/70 118/78  Pulse: 75 63  Resp: 20 17  Temp: 99.2 F (37.3 C) 98.1 F (36.7 C)  SpO2: 97% 99%   9. Hypothyroidism. Synthroid 10. Drug-induced constipation. Colace twice daily  Movantik 12.5 mg daily, increase to 20, 12/27  MiraLAX daily as needed 11. BPH with dysuria  UA+, keflex ordered, UC pending.   12/29- Cx shows resistance ot Keflex- changed to  Bactrim DS for 5 days BID 12.  Leukocytosis  WBC 10.6 on 12/22  Afebrile  12/29- per #11- has UTI- serratia UTI 13. Constipation  12/29- LBM 3 days ago- has enema aqnd sorbitol prn if needed.    LOS: 8 days A FACE TO FACE EVALUATION WAS PERFORMED  Humna Moorehouse 10/18/2020, 10:18 AM

## 2020-10-18 NOTE — Progress Notes (Signed)
Physical Therapy Session Note  Patient Details  Name: Alexander Rojas MRN: [id number] Date of Birth: 1956/01/01  Today's Date: 10/18/2020 PT Individual Time: 6269-4854 PT Individual Time Calculation (min): 39 min   Short Term Goals: Week 1:  PT Short Term Goal 1 (Week 1): pt will move supine>< sit with min assist PT Short Term Goal 2 (Week 1): pt will move sit>< stand with min assist PT Short Term Goal 3 (Week 1): pt will initiate gait  Skilled Therapeutic Interventions/Progress Updates:   Received pt supine in bed, pt agreeable to therapy, and did not report pain at rest but reported increased pain to 9/10 in R hip with mobility (premedicated). Repositioning, rest breaks, and distraction done to reduce pain levels. Pt with numerous questions this morning requesting to "talk through" his concerns rather doing so much "working out." Session with emphasis on education, functional mobility/transfers, generalized strengthening, dynamic standing balance/coordination, ambulation, and improved activity tolerance. Pt with questions regarding how his wife will be able to assist with bed mobility. Pt rolled to L with supervision and transferred L sidelying<>sitting EOB with min A for trunk control while maintaining spinal precautions. Educated pt that he did >80% of transfer himself and than if wife could place hand underneath L scapula she would not need to provide much assist at all to assist pt to sitting. Donned LSO sitting EOB with total A. Pt with tendency to lean backwards on bilateral hands as this position "feels better on his back." Pt ambulated 61ft x 2 trials with RW and CGA to/from ortho gym with multiple standing rest breaks due to increase in R hip pain to 9/10. Pt asking therapist's opinions on lift chair. Therapist educated pt that lift chair could be beneficial on days when pt is having increased pain but did not want pt to become dependent on chair to help him stand every time, since pt is  currently able to stand with CGA/close supervision. Pt with questions regarding procedure if he were to fall at home. Therapist demonstrated technique for floor transfer and educated pt to first ask himself the following questions prior to getting up: Are you hurt? Did you hit your head? Is someone there to help you? Are you safe? Educated pt if he answers no to any of those questions, he is to call 911 and not try to get up. Pt very thankful for education therapist provided. Concluded session with pt sitting in recliner, needs within reach, and seatbelt alarm on.   Therapy Documentation Precautions:  Precautions Precautions: Back Precaution Comments: reviewed back precautions Required Braces or Orthoses: Spinal Brace Spinal Brace: Applied in supine position,Lumbar corset Restrictions Weight Bearing Restrictions: No   Therapy/Group: Individual Therapy Martin Majestic PT, DPT   10/18/2020, 9:39 AM

## 2020-10-18 NOTE — Progress Notes (Signed)
Occupational Therapy Session Note  Patient Details  Name: Alexander Rojas MRN: [id number] Date of Birth: 11/03/1955  Today's Date: 10/18/2020 OT Individual Time: 0122-2411 OT Individual Time Calculation (min): 73 min    Short Term Goals: Week 1:  OT Short Term Goal 1 (Week 1): Pt will perform supine to sit EOB in prep for ADL with Min A OT Short Term Goal 2 (Week 1): Pt will perform sit to stand Mod A in prep for LB ADL OT Short Term Goal 3 (Week 1): Pt will perform BSC/toilet transfer with Mod A and LRAD OT Short Term Goal 4 (Week 1): Pt will don brace with no more than Mod A   Skilled Therapeutic Interventions/Progress Updates:    Pt greeted at time of sesison supine in bed resting agreeable to OT session, speaking with surgeon who stepped out. No pain at rest, but did have severe R hip pain later in session with certain movements and mobility, rest breaks PRN and RN performed med pass at beginning of session. Vitals also checked by NT and WNL at beginning of session. Pt ambulated room > gym with CGA/close supervision and performed standing BITS activities in standing for 4 rounds varying from 1 minute to 5+ minutes, crossing midline and reaching out of BOS while adhering to back precautions, graded as needed for no UE support and aeromat. Ambulated gym > ADL apartment in same manner as above and trialed hip flexion activities to improve ability functionally reach for personal items at home for ADL, unable to perform any hip flexion without inciting hip pain. Pt performed several chest/upper back stretches before attempting again, and this time able to perform 2x10 forward reaches on large therapy ball to improve functional reach. Ambulated apartment > bed with CGA with RW and wanted to change clothes, doffed with assist d/t pain and Min A for UB dress with orthotic and Min LB dress as well only assist to thread. Sit to supine Min and call bell in reach all needs met.   Therapy  Documentation Precautions:  Precautions Precautions: Back Precaution Comments: reviewed back precautions Required Braces or Orthoses: Spinal Brace Spinal Brace: Applied in supine position,Lumbar corset Restrictions Weight Bearing Restrictions: No     Therapy/Group: Individual Therapy  Viona Gilmore 10/18/2020, 4:36 PM

## 2020-10-18 NOTE — Progress Notes (Signed)
Physical Therapy Session Note  Patient Details  Name: Alexander Rojas MRN: [id number] Date of Birth: Feb 21, 1956  Today's Date: 10/18/2020 PT Individual Time: 1130-1158 + 1500-1529 PT Individual Time Calculation (min): 28 min  + 29 min  Short Term Goals: Week 1:  PT Short Term Goal 1 (Week 1): pt will move supine>< sit with min assist PT Short Term Goal 2 (Week 1): pt will move sit>< stand with min assist PT Short Term Goal 3 (Week 1): pt will initiate gait  Skilled Therapeutic Interventions/Progress Updates:     1st session: Pt greeted seated in recliner, agreeable to PT session. He reports mild lower back and BLE pain, unrated and denies request for pain medication. Rest breaks and mobility provided for pain management. Sit<>stand with supervision to RW, pain limiting during transition. He ambulated >256ft with RW and supervision in hallways, gait speed decreased and light reliance on RW. Transferred to Nustep with supervision and RW and he was positioned properly on machine but unable to tolerate stepping motion due to pain. Noted anterior pelvic tilt and attempted manual facilitation of the pelvic for comfort/positioning but pt unable to tolerate. He ambulated back to his room with supervision and RW (required a few brief standing rest breaks due to back pain). Sit>stand with supervision to Kreg bed and he required minA for BLE management via reverse log roll. Pt requesting to keep corset brace on during bed. Ended session semi-reclined with needs in reach.  2nd session: Pt received supine in bed, agreeable to PT session. Reports 2/10 LBP, denies request for pain medication. Mobility provided for pain management. Performed supine<>sit via log rolling with minA for BLE management. Donned quick-draw lumbar corset brace with totalA for time management while he sat EOB. Sit>stand with supervision to RW from bed level height. He ambulated with supervision and RW throughout session, ambulating >247ft  intervals but required brief standing rest breaks due to pain. He negotiated up/down x8 steps with CGA and bilateral hand rail support, cues for step to pattern with L foot leading ascent and R foot leading descent. No knee buckling or LOB noted but mild increase in LBP, requiring seated rest break for recovery. Then performed BLE strengthening by doing eccentric step downs on blue air-ex foam pad, 1x10 each leg and minA guard and RW support. Pt then ambulated >3101ft with supervision and RW on level surfaces, conversing throughout and no LOB noted. He required minA for reverse log roll back to bed and ended session supine with needs in reach, corset brace removed with totalA.   Therapy Documentation Precautions:  Precautions Precautions: Back Precaution Comments: reviewed back precautions Required Braces or Orthoses: Spinal Brace Spinal Brace: Applied in supine position,Lumbar corset Restrictions Weight Bearing Restrictions: No  Therapy/Group: Individual Therapy  [name] PT 10/18/2020, 7:53 AM

## 2020-10-19 ENCOUNTER — Inpatient Hospital Stay (HOSPITAL_COMMUNITY): Payer: 59

## 2020-10-19 ENCOUNTER — Inpatient Hospital Stay (HOSPITAL_COMMUNITY): Payer: 59 | Admitting: Occupational Therapy

## 2020-10-19 MED ORDER — TRAMADOL HCL 50 MG PO TABS
100.0000 mg | ORAL_TABLET | Freq: Four times a day (QID) | ORAL | Status: DC | PRN
  Administered 2020-10-20 – 2020-10-26 (×9): 100 mg via ORAL
  Filled 2020-10-19 (×10): qty 2

## 2020-10-19 MED ORDER — DIAZEPAM 5 MG PO TABS
5.0000 mg | ORAL_TABLET | Freq: Two times a day (BID) | ORAL | Status: DC
  Administered 2020-10-19 – 2020-10-23 (×9): 5 mg via ORAL
  Filled 2020-10-19 (×9): qty 1

## 2020-10-19 NOTE — Progress Notes (Signed)
[place] PHYSICAL MEDICINE & REHABILITATION PROGRESS NOTE   Subjective/Complaints:  Pt reports that dysuria slightly better this AM.  Had good BM yesterday with enema on bedpan. Cannot tolerate sitting position very well due to muscle spasms.   Asking for benzo, which is appropriate, for muscle spasms- also suggest tennis ball to relax muscles.   Increase in Nucynta was helpful for pain.  Also Dr Yetta Barre suggested a DEXA scan, due to bones being soft during surgery.   Asked me to call wife to discuss case.    ROS:   Pt denies SOB, abd pain, CP, N/V/C/D, and vision changes   Objective:   No results found. No results for input(s): WBC, HGB, HCT, PLT in the last 72 hours. No results for input(s): NA, K, CL, CO2, GLUCOSE, BUN, CREATININE, CALCIUM in the last 72 hours.  Intake/Output Summary (Last 24 hours) at 10/19/2020 0904 Last data filed at 10/19/2020 0851 Gross per 24 hour  Intake 480 ml  Output 2075 ml  Net -1595 ml        Physical Exam: Vital Signs Blood pressure 115/75, pulse 73, temperature 98.4 F (36.9 C), temperature source Oral, resp. rate 18, height 5\' 11"  (1.803 m), SpO2 95 %. Gen: standing up to void with OT, using urinal, NAD, wearing LSO brace HEENT: oral mucosa pink and moist, NCAT Cardio: RRR Chest: CTA B/L- no W/R/R- good air movement Abd: Soft, NT, ND, (+)BS  Ext: no edema Skin: Warm and dry.  Incision healed over Psych: appropriate, asking appropriate questions Musc: No edema in extremities.  No tenderness in extremities. Neuro: Alert Motor: Bilateral upper extremities: >/4/5 throughout Right lower extremity: >/ 3/5 proximal distal  Assessment/Plan: 1. Functional deficits which require 3+ hours per day of interdisciplinary therapy in a comprehensive inpatient rehab setting.  Physiatrist is providing close team supervision and 24 hour management of active medical problems listed below.  Physiatrist and rehab team continue to assess  barriers to discharge/monitor patient progress toward functional and medical goals  Care Tool:  Bathing    Body parts bathed by patient: Right arm,Left arm,Chest,Abdomen,Face,Front perineal area   Body parts bathed by helper: Buttocks,Right lower leg,Left lower leg     Bathing assist Assist Level: Moderate Assistance - Patient 50 - 74%     Upper Body Dressing/Undressing Upper body dressing   What is the patient wearing?: Pull over shirt,Orthosis Orthosis activity level: Performed by helper  Upper body assist Assist Level: Contact Guard/Touching assist    Lower Body Dressing/Undressing Lower body dressing      What is the patient wearing?: Pants     Lower body assist Assist for lower body dressing: Minimal Assistance - Patient > 75%     Toileting Toileting Toileting Activity did not occur (Clothing management and hygiene only): N/A (no void or bm) (did not need to void and in too much pain for toileting at eval)  Toileting assist Assist for toileting: Maximal Assistance - Patient 25 - 49% Assistive Device Comment: urinal sitting in chair   Transfers Chair/bed transfer  Transfers assist  Chair/bed transfer activity did not occur: Safety/medical concerns (unable due to pain)  Chair/bed transfer assist level: Contact Guard/Touching assist     Locomotion Ambulation   Ambulation assist   Ambulation activity did not occur: Safety/medical concerns (unable due to pain)  Assist level: Contact Guard/Touching assist Assistive device: Walker-rolling Max distance: 21ft   Walk 10 feet activity   Assist     Assist level: Contact Guard/Touching assist Assistive  device: Walker-rolling   Walk 50 feet activity   Assist    Assist level: Contact Guard/Touching assist Assistive device: Walker-rolling    Walk 150 feet activity   Assist    Assist level: Contact Guard/Touching assist Assistive device: Walker-rolling    Walk 10 feet on uneven surface   activity   Assist Walk 10 feet on uneven surfaces activity did not occur: Safety/medical concerns (unable due to pain)         Wheelchair     Assist Will patient use wheelchair at discharge?: No Type of Wheelchair: Manual Wheelchair activity did not occur: Safety/medical concerns (unable due to pain)         Wheelchair 50 feet with 2 turns activity    Assist            Wheelchair 150 feet activity     Assist          Medical Problem List and Plan: 1.Lower extremity weakness right greater than left with numbness and decrease in sensationsecondary to failure of anterior and posterior instrumentation L5-S1 fusion from 09/19/2020. Status post lumbar reexploration and removal of instrumentation L5-S1 repeat decompression both L5 nerve roots with segmental fixation L4 09/27/2020. Pt demonstrates residual right L5 radiculopathy on exam. -Back brace as directed  Continue CIR  -Interdisciplinary Team Conference today  2. 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 Pain Management:Nucynta 75-100 mg every 6 hoursprn,Robaxin 500 mg every 6 hours as needed Neurontin 400 mg 3 times daily, tylenol prn.   Lyrica 75 mg BID , increased to 100 3 times daily on 12/27  Added Lidoderm patches 2 patches 9pm to 9am  12/28: increase Robaxin to 750mg  TID.  12/29- asking to increase Nucynta will increase to 150 mg QID for pain.   12/30- increased Nucynta helping  Pain some 4. Mood:Provide emotional support -antipsychotic agents: N/A 5. Neuropsych: This patientiscapable of making decisions on hisown behalf. 6. Skin/Wound Care:Routine skin checks 7. Fluids/Electrolytes/Nutrition:Routine in and outs 8. Hypertension. HCTZ 12.5 mg daily DC'd on 12/28 due to soft blood pressures  Cozaar 50 mg daily, decreased to 25 on  12/28  12/28: d/c Cozaar given soft BP  Monitor with increased mobility   Vitals:   10/18/20 1955 10/19/20 0617  BP: 97/65 115/75  Pulse: 78 73  Resp: 18 18  Temp: 98.6 F (37 C) 98.4 F (36.9 C)  SpO2: 97% 95%   9. Hypothyroidism. Synthroid 10. Drug-induced constipation. Colace twice daily  Movantik 12.5 mg daily, increase to 20, 12/27  MiraLAX daily as needed 11. BPH with dysuria  UA+, keflex ordered, UC pending.   12/29- Cx shows resistance to Keflex- changed to Bactrim DS for 5 days BID 12.  Leukocytosis  WBC 10.6 on 12/22  Afebrile  12/29- per #11- has UTI- serratia UTI  12/30- dysuria somewhat better 13. Constipation  12/29- LBM 3 days ago- has enema and sorbitol prn if needed.  14. Muscle spasms  12/30- will try Valium 5 mg BID; and tennis balls for muscle spasms- if doesn't work, will try trigger points.     I called wife and spent a total amount on pt today of 35 minutes- > 50% of coordination of care, discussing pt's prognosis, ability to care for self at home, and trigger point in R buttock  LOS: 9 days A FACE TO FACE EVALUATION WAS PERFORMED  Alexander Rojas 10/19/2020, 9:04 AM

## 2020-10-19 NOTE — Progress Notes (Signed)
Occupational Therapy Weekly Progress Note  Patient Details  Name: Alexander Rojas MRN: [id number] Date of Birth: Jun 12, 1956  Beginning of progress report period: October 11, 2020 End of progress report period: October 19, 2020  Today's Date: 10/19/2020 OT Individual Time: 9798-9211 OT Individual Time Calculation (min): 56 min    Patient has met 4 of 4 short term goals.  Pt is limited by inconsistent pain in R hip from muscle spasms and nerve pain, at times performing transfers with RW and close supervision/CGA when pain free and other times requires [facility] for transfers but is mostly CGA. LB ADL with Min A with low pain levels requiring up to Max A with severe pain. Pt has showered at shower level x2 but pain has been limiting both trials. Pt is able to static stand for 10+ minutes without difficulty to perform tasks but is limited by pain with forward bending/reaching during ADL (within back precautions). Pt's pain has improved from eval and is able to participate more in BADL, planning to continue positioning for pain relief techniques and family ed with wife prior to DC.  Patient continues to demonstrate the following deficits: muscle weakness, decreased cardiorespiratoy endurance, impaired timing and sequencing, unbalanced muscle activation and decreased motor planning and decreased standing balance, decreased postural control and decreased balance strategies and therefore will continue to benefit from skilled OT intervention to enhance overall performance with BADL and Reduce care partner burden.  Patient progressing toward long term goals..  Continue plan of care.  OT Short Term Goals Week 1:  OT Short Term Goal 1 (Week 1): Pt will perform supine to sit EOB in prep for ADL with Min A OT Short Term Goal 1 - Progress (Week 1): Met OT Short Term Goal 2 (Week 1): Pt will perform sit to stand Mod A in prep for LB ADL OT Short Term Goal 2 - Progress (Week 1): Met OT Short Term Goal 3 (Week 1):  Pt will perform BSC/toilet transfer with Mod A and LRAD OT Short Term Goal 3 - Progress (Week 1): Met OT Short Term Goal 4 (Week 1): Pt will don brace with no more than Mod A OT Short Term Goal 4 - Progress (Week 1): Met Week 2:  OT Short Term Goal 1 (Week 2): STGs = LTGs d/t ELOS  Skilled Therapeutic Interventions/Progress Updates:    Pt greeted at time of session supine in bed resting agreeable to OT session, no pain at rest. Supine to sit EOB with Min A, before donning brace with Supervision for first time as he wanted to attempt. Donned socks sitting EOB trialing sock aide as pt has purchased a hip kit and needed assist to use sock aide as his pain prevented letting go of bed surface as he uses BUE support, however pt did understand instructions for sock aide and will plan to use again when in less pain. Sit to stand CGA/close supervision from bed surface and ambulated to sink level where he completed oral hygiene with set up, grooming for shaving face with Min A only for thoroughness, and able to stand at sink without UE support to perform tasks but continues to have pain with forward movements and hip flexion. Ambulated back to bed surface where he completed UB dressing Supervision including brace, pants with Min A to thread as he was able to pull up and over hips in standing. Pt wanting to sit up in recliner, alarm on call bell in reach.     Therapy Documentation  Precautions:  Precautions Precautions: Back Precaution Comments: reviewed back precautions Required Braces or Orthoses: Spinal Brace Spinal Brace: Applied in supine position,Lumbar corset Restrictions Weight Bearing Restrictions: No     Therapy/Group: Individual Therapy  Viona Gilmore 10/19/2020, 7:10 AM

## 2020-10-19 NOTE — Discharge Instructions (Addendum)
Inpatient Rehab Discharge Instructions  Alexander Rojas Discharge date and time: No discharge date for patient encounter.   Activities/Precautions/ Functional Status: Activity: Back brace as directed Diet: regular diet Wound Care: Routine skin checks Functional status:  ___ No restrictions     ___ Walk up steps independently ___ 24/7 supervision/assistance   ___ Walk up steps with assistance ___ Intermittent supervision/assistance  ___ Bathe/dress independently ___ Walk with walker     _x__ Bathe/dress with assistance ___ Walk Independently    ___ Shower independently ___ Walk with assistance    ___ Shower with assistance ___ No alcohol     ___ Return to work/school ________  COMMUNITY REFERRALS UPON DISCHARGE:    Home Health:   PT     OT  [facility]                        Agency: [facility] Phone: [phone]    Medical Equipment/Items Ordered: Hospital Bed, Agricultural consultant, Patient owns: [facility] and Paediatric nurse                                                  Agency/Supplier: [employer]    Special Instructions: PLEASE ALLOW 24-72 HOURS AFTER DISCHARGE FOR HOME HEALTH TO SCHEDULE  No driving smoking or alcohol   My questions have been answered and I understand these instructions. I will adhere to these goals and the provided educational materials after my discharge from the hospital.  Patient/Caregiver Signature _______________________________ Date __________  Clinician Signature _______________________________________ Date __________  Please bring this form and your medication list with you to all your follow-up doctor's appointments.    Information on my medicine - XARELTO (rivaroxaban)  WHY WAS XARELTO PRESCRIBED FOR YOU? Xarelto was prescribed to treat blood clots that may have been found in the veins of your legs (deep vein thrombosis) or in your lungs (pulmonary embolism) and to reduce the risk of them occurring again.  What do you need to know about  Xarelto? The starting dose is one 15 mg tablet taken TWICE daily with food for the FIRST 21 DAYS then on 11/01/2020 the dose is changed to one 20 mg tablet taken ONCE A DAY with your evening meal.  DO NOT stop taking Xarelto without talking to the health care provider who prescribed the medication.  Refill your prescription for 20 mg tablets before you run out.  After discharge, you should have regular check-up appointments with your healthcare provider that is prescribing your Xarelto.  In the future your dose may need to be changed if your kidney function changes by a significant amount.  What do you do if you miss a dose? If you are taking Xarelto TWICE DAILY and you miss a dose, take it as soon as you remember. You may take two 15 mg tablets (total 30 mg) at the same time then resume your regularly scheduled 15 mg twice daily the next day.  If you are taking Xarelto ONCE DAILY and you miss a dose, take it as soon as you remember on the same day then continue your regularly scheduled once daily regimen the next day. Do not take two doses of Xarelto at the same time.   Important Safety Information Xarelto is a blood thinner medicine that can cause bleeding. You should call your healthcare  provider right away if you experience any of the following: ? Bleeding from an injury or your nose that does not stop. ? Unusual colored urine (red or dark brown) or unusual colored stools (red or black). ? Unusual bruising for unknown reasons. ? A serious fall or if you hit your head (even if there is no bleeding).  Some medicines may interact with Xarelto and might increase your risk of bleeding while on Xarelto. To help avoid this, consult your healthcare provider or pharmacist prior to using any new prescription or non-prescription medications, including herbals, vitamins, non-steroidal anti-inflammatory drugs (NSAIDs) and supplements.  This website has more information on Xarelto:  [email or web address].

## 2020-10-19 NOTE — Progress Notes (Signed)
Physical Therapy Session Note  Patient Details  Name: Alexander Rojas MRN: [id number] Date of Birth: 1956/03/13  Today's Date: 10/19/2020 PT Individual Time: 3428-7681 PT Individual Time Calculation (min): 43 min   Short Term Goals: Week 1:  PT Short Term Goal 1 (Week 1): pt will move supine>< sit with min assist PT Short Term Goal 2 (Week 1): pt will move sit>< stand with min assist PT Short Term Goal 3 (Week 1): pt will initiate gait  Skilled Therapeutic Interventions/Progress Updates:   Received pt sitting in recliner, pt agreeable to therapy, and reported pain 1/10 in R hip increasing to 5-6/10 with ambulation. Repositioning, rest breaks, and distraction done to reduce pain levels. Session with emphasis on functional mobility/transfers, generalized strengthening, dynamic standing balance/coordinaiton, ambulation, and improved activity tolerance. Pt ambulated >324ft x2 trials with RW and supervision to/from dayroom with multiple standing rest breaks due to reports of increased R hip pain. Attempted piriformis trigger point release sitting on mat with therapist positioning ball for ~2 minutes. Pt ambulated additional 149ft with RW and supervision again with multiple standing rest breaks but reported no relief in symptoms after trigger point release. Pt transferred sit<>supine and reported instant pain relief. Pt performed the following exercises supine with supervision and verbal cues for technique: -bridges x12 -LTR x5 bilaterally (decreased ROM to maintain spinal precautions) -pelvic tilts x10 with therapist's hand underneath low back for tactile feedback.  Pt transferred supine<>R sidelying<>sitting on mat with CGA using logroll technique while maintaining precautions. Concluded session with pt sitting in recliner, needs within reach, and seatbelt alarm on.   Therapy Documentation Precautions:  Precautions Precautions: Back Precaution Comments: reviewed back precautions Required Braces  or Orthoses: Spinal Brace Spinal Brace: Applied in supine position,Lumbar corset Restrictions Weight Bearing Restrictions: No   Therapy/Group: Individual Therapy Martin Majestic PT, DPT   10/19/2020, 7:34 AM

## 2020-10-19 NOTE — Progress Notes (Signed)
Physical Therapy Weekly Progress Note  Patient Details  Name: Alexander Rojas MRN: [id number] Date of Birth: 1955-12-07  Beginning of progress report period: October 11, 2020 End of progress report period: October 19, 2020  Today's Date: 10/19/2020 PT Individual Time:Session1: 0930-1030; Session2: 1300-1330 PT Individual Time Calculation (min): 60 min & 30 min  Patient has met 3 of 3 short term goals.  Patient able to ambulate up to 400' with RW and S.  Sit to stand performed with S and able to negotiate 4 steps sideways technique R rail with cues and close S.  Still having trouble attempting car transfer and with intermittent pain limiting mobility.  Patient continues to demonstrate the following deficits muscle weakness, muscle joint tightness and pain and therefore will continue to benefit from skilled PT intervention to increase functional independence with mobility.  Patient progressing toward long term goals..  Continue plan of care.  PT Short Term Goals Week 1:  PT Short Term Goal 1 (Week 1): pt will move supine>< sit with min assist PT Short Term Goal 1 - Progress (Week 1): Met PT Short Term Goal 2 (Week 1): pt will move sit>< stand with min assist PT Short Term Goal 2 - Progress (Week 1): Met PT Short Term Goal 3 (Week 1): pt will initiate gait PT Short Term Goal 3 - Progress (Week 1): Met Week 2:  PT Short Term Goal 1 (Week 2): STG=LTG due to ELOS  Skilled Therapeutic Interventions/Progress Updates:  Ambulation/gait training;Discharge planning;DME/adaptive equipment instruction;Functional mobility training;Pain management;Psychosocial support;Therapeutic Activities;Splinting/orthotics;UE/LE Strength taining/ROM;Balance/vestibular training;Community reintegration;Neuromuscular re-education;Patient/family education;Stair training;Therapeutic Exercise;UE/LE Coordination activities;Wheelchair propulsion/positioning   Session1:  Patient in recliner and reports minimal pain.   Voicing concern over his bed height at home (36" from the floor, but has step stool).  Also reports hopes to be allowed to get into prone again today, feels it helps.  Patient sit to stand with S to RW and ambulated x 400' with S several stops due to pain and coaching to utilize pain relieving positions (I.e. pick R leg up (knee to chest) for break in pain during standing rests.)  Patient negotiated 4 steps with R rail sideways technique with CGA and cues.  Patient sit to supine on mat with S/CGA and using leg lifter for R leg onto bed with cues.  Patient with LE's on green therapy ball lower trunk rotation and lower trunk flexion with feet on the ball x 10 each.  Assisted stretching for both legs for hip extensors, hamstrings and hip rotators.  Patient supine to sit with S from sidelying cues for technique.  Simulated bed mobility/car transfer from 27" height mat using hands to scoot then initiated lifting legs from step stool under feet.  C/o increased R hip pain and relieved with sit to stand.  Patient returned to supine then rolled to prone with S using leg lifter and cues, incresed time.  Prone lying with improved symptoms x 2 minutes.  Patient returned to side and side to sit with S with cues.  Ambulated to room with RW and patient showed photo of his bed at home with step stool.  Asked pt to have wife measure stool height to simulate to practice.  Patient left seated in recliner with pillows under hips, belt alarm active and needs in reach.  [place]:  Patient in supine reporting pain after sititng in chair so long so nursing staff helped him back to bed.  Patient supine to sit through L sidelying with S.  Patient sit to stand to RW S.  Ambulated to ortho gym.  Performed car transfer to simulated sedan height with min A for LE's due to increased pain.  Patient related pain 10/10 and once standing back down to 2/10.  Patient ambulated to therapy gym.  In parallel bars R SLS to tap L up to BOSU on L side for  R hip stabilizer activation x 5 with pt c/o increased R hip pain. Side stepping with cues and facilitation for R hip stabilization.  Walking in hallway along wall rail no walker with CGA noted increased trendelenburg with rail on R side so facilitation on R hip with R stance. Patient standing at steps with UE support on rails for step taps to 3rd step and facilitation for R hip stabilizer.  Ambulated to room with RW and S.  Left seated in recliner with belt alarm and needs in reach.  Therapy Documentation Precautions:  Precautions Precautions: Back Precaution Comments: reviewed back precautions Required Braces or Orthoses: Spinal Brace Spinal Brace: Applied in supine position,Lumbar corset Restrictions Weight Bearing Restrictions: No Pain: Pain Assessment Pain Scale: 0-10 Pain Score: 8  Pain Type: Acute pain Pain Location: Head Pain Intervention(s): MD notified (Comment)     Therapy/Group: Individual Therapy  Reginia Naas  Maddock, PT 10/19/2020, 8:23 AM

## 2020-10-20 ENCOUNTER — Inpatient Hospital Stay (HOSPITAL_COMMUNITY): Payer: 59 | Admitting: Occupational Therapy

## 2020-10-20 ENCOUNTER — Inpatient Hospital Stay (HOSPITAL_COMMUNITY): Payer: 59

## 2020-10-20 DIAGNOSIS — Z981 Arthrodesis status: Secondary | ICD-10-CM

## 2020-10-20 DIAGNOSIS — N390 Urinary tract infection, site not specified: Secondary | ICD-10-CM

## 2020-10-20 DIAGNOSIS — R3911 Hesitancy of micturition: Secondary | ICD-10-CM

## 2020-10-20 DIAGNOSIS — N401 Enlarged prostate with lower urinary tract symptoms: Secondary | ICD-10-CM

## 2020-10-20 NOTE — Progress Notes (Signed)
Physical Therapy Session Note  Patient Details  Name: Alexander Rojas MRN: [id number] Date of Birth: 1956/01/20  Today's Date: 10/20/2020 PT Individual Time: 6948-5462 PT Individual Time Calculation (min): 24 min   Short Term Goals: Week 1:  PT Short Term Goal 1 (Week 1): pt will move supine>< sit with min assist PT Short Term Goal 1 - Progress (Week 1): Met PT Short Term Goal 2 (Week 1): pt will move sit>< stand with min assist PT Short Term Goal 2 - Progress (Week 1): Met PT Short Term Goal 3 (Week 1): pt will initiate gait PT Short Term Goal 3 - Progress (Week 1): Met Week 2:  PT Short Term Goal 1 (Week 2): STG=LTG due to ELOS  Skilled Therapeutic Interventions/Progress Updates:   Received pt supine in bed, pt agreeable to therapy, and reported pain 5/10 in R hip with mobility. Repositioning, rest breaks, and distraction done to reduce pain levels. Session with emphasis on functional mobility/transfers, generalized strengthening, dynamic standing balance/coordination, ambulation, and improved activity tolerance. Pt transferred supine<>sitting EOB with supervision using logroll technique. Donned LSO with max A and pt ambulated 166ft x 2 trials to/from therapy gym with RW and supervision. Pt navigated 12 steps with 2 rails and CGA/close supervision ascending with a step through and descending with a step to pattern with cues for "up with the good, down with bad" technique. Pt performed standing alternating toe taps to 6in step with min handheld assist 2x20 reps. Concluded session with pt sitting in recliner, needs within reach, and seatbelt alarm on.   Therapy Documentation Precautions:  Precautions Precautions: Back Precaution Comments: reviewed back precautions Required Braces or Orthoses: Spinal Brace Spinal Brace: Applied in supine position,Lumbar corset Restrictions Weight Bearing Restrictions: No   Therapy/Group: Individual Therapy Alfonse Alpers PT, DPT    10/20/2020, 7:37 AM

## 2020-10-20 NOTE — Progress Notes (Signed)
Physical Therapy Session Note  Patient Details  Name: Alexander Rojas MRN: [id number] Date of Birth: January 05, 1956  Today's Date: 10/20/2020 PT Individual Time: 0936-1030 PT Individual Time Calculation (min): 54 min   Short Term Goals: Week 2:  PT Short Term Goal 1 (Week 2): STG=LTG due to ELOS  Skilled Therapeutic Interventions/Progress Updates:    Patient in supine assisted to don TED stockings and pt donned L sock, assist for R.  Supine to sit S.  Patient donned brace in standing with S.  Ambulated to ADL apartment with S and with RW over carpet, tile, etc with S.  Stand<>sit to low recliner with S increased time, heavily reliant on UE support due to pain.  Patient practiced obtaining items in kitchen from cabinet and replacing and looking in refrigerator and microwave.  Discussed options for cleaning dishes as not safely able likely to get items in dishwasher unless seated or kneeling, but not sure pt can tolerate kneeling.  Return to room to check wife's message about height of step stool to get into bed @ 36" height from floor.  Stool is 7" then 8" step.  Simulated to pt's bed in the room at 36" and used 8" step and 4" step on top.  Demonstrated using walker to step up reverse technique to first step, then up to next step using rail on bed.  Patient performed with CGA, but reported feeling unsteady up on step.  Discussed possibly sleeping in another bedroom at home till able to practice with HHPT as he feels might feel safer crawling up onto bed from bench at end of the bed.  Patient supine to sit with S with bed back at normal height.  Ambulated to ortho gym.  Standing balance with S feet on floor to touch targets on BITS over whole screen x 54 circles.  Then again with L foot on 4" step for increased R weight shift.  Then again with both feet on wedge for heel cord stretch with 1 UE supported.  Patient ambulated to room and back to supine for self stretch educated in knee rocking, then in self  stretch with towel at knee for single knee to chest 2 x 20 sec then extending knee for hamstring stretch.  Also showed using gait belt for long leg hamstring stretch with knee extended on both leg.  Patient left in supine with call bell and needs in reach.   Therapy Documentation Precautions:  Precautions Precautions: Back Precaution Comments: reviewed back precautions Required Braces or Orthoses: Spinal Brace Spinal Brace: Applied in supine position,Lumbar corset Restrictions Weight Bearing Restrictions: No Pain: Pain Assessment Pain Scale: 0-10 Pain Score: 6  Pain Location: Leg Pain Orientation: Left Pain Descriptors / Indicators: Aching;Shooting Pain Frequency: Intermittent Pain Onset: On-going Patients Stated Pain Goal: 3 Pain Intervention(s): Medication (See eMAR)    Therapy/Group: Individual Therapy  [name]  [name], PT 10/20/2020, 10:59 AM

## 2020-10-20 NOTE — Progress Notes (Signed)
[facility] PHYSICAL MEDICINE & REHABILITATION PROGRESS NOTE   Subjective/Complaints: Patient seen laying in bed this AM.  He states he slept well overnight.  He notes improvement in pain.  He has questions regarding UTI.  ROS: Denies CP, SOB, N/V/D  Objective:   No results found. No results for input(s): WBC, HGB, HCT, PLT in the last 72 hours. No results for input(s): NA, K, CL, CO2, GLUCOSE, BUN, CREATININE, CALCIUM in the last 72 hours.  Intake/Output Summary (Last 24 hours) at 10/20/2020 0909 Last data filed at 10/20/2020 0745 Gross per 24 hour  Intake 720 ml  Output 1325 ml  Net -605 ml        Physical Exam: Vital Signs Blood pressure (!) 93/58, pulse 75, temperature 98.9 F (37.2 C), temperature source Oral, resp. rate 16, height 5\' 11"  (1.803 m), SpO2 92 %. Constitutional: No distress . Vital signs reviewed. HENT: Normocephalic.  Atraumatic. Eyes: EOMI. No discharge. Cardiovascular: No JVD.  RRR. Respiratory: Normal effort.  No stridor.  Bilateral clear to auscultation. GI: Non-distended.  BS +. Skin: Warm and dry.  Intact. Psych: Normal mood.  Normal behavior. Musc: No edema in extremities.  No tenderness in extremities. Neuro: Alert Motor: Bilateral upper extremities: >/4/5 throughout Left lower extremity: 4-4+/5 proximal distal Right lower extremity: 3+-4-/5 proximal distal  Assessment/Plan: 1. Functional deficits which require 3+ hours per day of interdisciplinary therapy in a comprehensive inpatient rehab setting.  Physiatrist is providing close team supervision and 24 hour management of active medical problems listed below.  Physiatrist and rehab team continue to assess barriers to discharge/monitor patient progress toward functional and medical goals  Care Tool:  Bathing    Body parts bathed by patient: Right arm,Left arm,Chest,Abdomen,Face,Front perineal area   Body parts bathed by helper: Buttocks,Right lower leg,Left lower leg     Bathing  assist Assist Level: Moderate Assistance - Patient 50 - 74%     Upper Body Dressing/Undressing Upper body dressing   What is the patient wearing?: Pull over shirt,Orthosis Orthosis activity level: Performed by helper  Upper body assist Assist Level: Contact Guard/Touching assist    Lower Body Dressing/Undressing Lower body dressing      What is the patient wearing?: Pants     Lower body assist Assist for lower body dressing: Minimal Assistance - Patient > 75%     Toileting Toileting Toileting Activity did not occur (Clothing management and hygiene only): N/A (no void or bm) (did not need to void and in too much pain for toileting at eval)  Toileting assist Assist for toileting: Maximal Assistance - Patient 25 - 49% Assistive Device Comment: urinal sitting in chair   Transfers Chair/bed transfer  Transfers assist  Chair/bed transfer activity did not occur: Safety/medical concerns (unable due to pain)  Chair/bed transfer assist level: Supervision/Verbal cueing     Locomotion Ambulation   Ambulation assist   Ambulation activity did not occur: Safety/medical concerns (unable due to pain)  Assist level: Supervision/Verbal cueing Assistive device: Walker-rolling Max distance: 400'   Walk 10 feet activity   Assist     Assist level: Supervision/Verbal cueing Assistive device: Walker-rolling   Walk 50 feet activity   Assist    Assist level: Supervision/Verbal cueing Assistive device: Walker-rolling    Walk 150 feet activity   Assist    Assist level: Supervision/Verbal cueing Assistive device: Walker-rolling    Walk 10 feet on uneven surface  activity   Assist Walk 10 feet on uneven surfaces activity did not occur: Safety/medical  concerns (unable due to pain)         Wheelchair     Assist Will patient use wheelchair at discharge?: No Type of Wheelchair: Manual Wheelchair activity did not occur: Safety/medical concerns (unable due to  pain)         Wheelchair 50 feet with 2 turns activity    Assist            Wheelchair 150 feet activity     Assist          Medical Problem List and Plan: 1.Lower extremity weakness right greater than left with numbness and decrease in sensationsecondary to failure of anterior and posterior instrumentation L5-S1 fusion from 09/19/2020. Status post lumbar reexploration and removal of instrumentation L5-S1 repeat decompression both L5 nerve roots with segmental fixation L4 09/27/2020. Pt demonstrates residual right L5 radiculopathy on exam. Back brace as directed  Continue CIR  Repeat DG spine showing for post surgical changes 2. 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 Pain Management:  Neurontin 400 mg 3 times daily, tylenol prn.   Lyrica 75 mg BID , increased to 100 3 times daily on 12/27  Added Lidoderm patches 2 patches 9pm to 9am  Increased Robaxin to 750mg  TID.  Increased Nucynta to 150 mg QID for pain on 12/29, again on 12/30  Valium 5 mg twice daily started on 12/30  Improving 4. Mood:Provide emotional support -antipsychotic agents: N/A 5. Neuropsych: This patientiscapable of making decisions on hisown behalf. 6. Skin/Wound Care:Routine skin checks 7. Fluids/Electrolytes/Nutrition:Routine in and outs 8. Hypertension. HCTZ 12.5 mg daily DC'd on 12/28 due to soft blood pressures  Cozaar DC'd on 12/28  Relatively soft/controlled on 12/31  Monitor with increased mobility   Vitals:   10/19/20 1945 10/20/20 0520  BP: 112/79 (!) 93/58  Pulse: 77 75  Resp: 16 16  Temp: 99.7 F (37.6 C) 98.9 F (37.2 C)  SpO2: 98% 92%   9. Hypothyroidism. Synthroid 10. Drug-induced constipation. Colace twice daily  Movantik 12.5 mg daily, increase to 20, 12/27  MiraLAX daily as needed  Improving 11. BPH  with UTI  UA+, keflex changed to Bactrim DS for 5 days BID due to sensitivities  Will consider Flomax.  Nocturnal hesitancy persists, however blood pressures soft at present 12.  Leukocytosis  WBC 10.6 on 12/22, labs ordered for Monday  See #11  Afebrile  LOS: 10 days A FACE TO FACE EVALUATION WAS PERFORMED  Shulamit Donofrio Sunday 10/20/2020, 9:09 AM

## 2020-10-20 NOTE — Progress Notes (Signed)
Occupational Therapy Session Note  Patient Details  Name: Alexander Rojas MRN: [id number] Date of Birth: 23-Mar-1956  Today's Date: 10/20/2020 OT Individual Time: 1105-1202 OT Individual Time Calculation (min): 57 min    Short Term Goals: Week 2:  OT Short Term Goal 1 (Week 2): STGs = LTGs d/t ELOS  Skilled Therapeutic Interventions/Progress Updates:    Session 1: [id number])  Pt in bed to start session, agreeable to session.  He was able to transfer from supine to sit on the left side of the bed with supervision.  He reports pain at 1/10 in his lower back which is much improved from previous session with this OT.  He next ambulated over to the sink and completed oral hygiene in standing with supervision.  Next, he was able to ambulated down to the ADL apartment with supervision using the RW for support.  He was educated on walk-in shower technique with use of the RW.  He was able to use the RW and step forward into the simulated shower with supervision.  Had him practice sit to stand from a regular shower seat, which he already has at home.  He was able to complete this with supervision.  Discussed and demonstrated technique for use of the reacher to dry his LEs and pt will have access to a LH sponge for washing his lower legs as well. Pt with immediate need to urinate, so transferred to the elevated toilet in the bathroom with supervision.  He was able to complete toilet hygiene and clothing with supervision as well.   Next, had him ambulate to the therapy gym with supervision and complete transfer to a regular seat with arms.  Continued to discuss bathroom setup and sequencing as well while resting.  He was able to demonstrate simulated completion of toilet hygiene in sitting and standing without breaking his back precautions.  Finished session with return to the room and transfer to the recliner for lunch.  Supervision for mobility back to the room.  Call button and phone in reach at end of session.     Session 2: [phone])  Pt with pain at 3/10 in the lower back.  Pt in bed to begin session agreeable to showering.  He reported a slight increase in his pain level from this morning as he was working on taking off his socks and TEDs prior to therapist entering room.  He needed total assist for removal of the right TED.  Therapist assisted with re-donning gripper socks in order to ambulate over to the sink for oral hygiene and for transfer to the shower.  He was able to complete functional mobility over to the sink and complete oral hygiene in sitting with supervision.  He then ambulated to the shower for bathing.  He wanted to continue standing to try and remove clothing as well as for showering.  He took off his pullover shirt with supervision in standing and then removed his pants at the same level with use of the reacher.  He needed mod assist for removal of gripper socks as he could not complete this with the reacher.  Discussed that it would be easier in sitting, however pt with increase fear of pain level shooting up if he did.  He completed shower in standing with close supervision using the grab bars for support as well as the LH sponge for washing his lower legs and feet.  He stood to dry off as well with use of the reacher for drying off  his lower legs and feet.  Next, he completed toilet transfer to the 3:1 over the toilet at supervision level, but was unsuccessful at completing urination.  He attempted to sit on the 3:1 to dress but reported too much pain.  Instead of trying to stand, therapist recommended having him transfer back to the bed to dress.  He was able to complete this and used the reacher in supine for donning the pants over his feet and then rolled to pull them up over his hips.  He donned the pullover shirt with supervision as well, rolling to pul it over his trunk. Therapist assisted with donning gripper socks secondary to time, but pt is familiar with use of the sockaide.  Pt left  in bed to rest with the call button and phone in reach.      Therapy Documentation Precautions:  Precautions Precautions: Back Precaution Comments: reviewed back precautions Required Braces or Orthoses: Spinal Brace Spinal Brace: Applied in supine position,Lumbar corset Restrictions Weight Bearing Restrictions: No  Pain: Pain Assessment Pain Score: 3  ADL: See Care Tool Section for some details of mobility and selfcare  Therapy/Group: Individual Therapy  Lynell Greenhouse OTR/L 10/20/2020, 3:59 PM

## 2020-10-21 DIAGNOSIS — Z981 Arthrodesis status: Secondary | ICD-10-CM | POA: Diagnosis not present

## 2020-10-21 DIAGNOSIS — I10 Essential (primary) hypertension: Secondary | ICD-10-CM | POA: Diagnosis not present

## 2020-10-21 DIAGNOSIS — I82422 Acute embolism and thrombosis of left iliac vein: Secondary | ICD-10-CM | POA: Diagnosis not present

## 2020-10-21 DIAGNOSIS — Z8679 Personal history of other diseases of the circulatory system: Secondary | ICD-10-CM

## 2020-10-21 DIAGNOSIS — Z4789 Encounter for other orthopedic aftercare: Secondary | ICD-10-CM | POA: Diagnosis not present

## 2020-10-21 DIAGNOSIS — N390 Urinary tract infection, site not specified: Secondary | ICD-10-CM | POA: Diagnosis not present

## 2020-10-21 DIAGNOSIS — I82412 Acute embolism and thrombosis of left femoral vein: Secondary | ICD-10-CM | POA: Diagnosis not present

## 2020-10-21 DIAGNOSIS — Z79890 Hormone replacement therapy (postmenopausal): Secondary | ICD-10-CM | POA: Diagnosis not present

## 2020-10-21 DIAGNOSIS — N400 Enlarged prostate without lower urinary tract symptoms: Secondary | ICD-10-CM | POA: Diagnosis not present

## 2020-10-21 DIAGNOSIS — E039 Hypothyroidism, unspecified: Secondary | ICD-10-CM | POA: Diagnosis not present

## 2020-10-21 NOTE — Progress Notes (Signed)
[facility] PHYSICAL MEDICINE & REHABILITATION PROGRESS NOTE   Subjective/Complaints: Patient seen standing up and later ambulating this AM.  He states he slept well overnight.  He notes improvement in urinary symptoms.  He has questions regarding DME.  He states he has not had a bowel movement but is going to take the available as needed medications.  ROS: Denies CP, SOB, N/V/D  Objective:   No results found. No results for input(s): WBC, HGB, HCT, PLT in the last 72 hours. No results for input(s): NA, K, CL, CO2, GLUCOSE, BUN, CREATININE, CALCIUM in the last 72 hours.  Intake/Output Summary (Last 24 hours) at 10/21/2020 1709 Last data filed at 10/21/2020 1358 Gross per 24 hour  Intake 598 ml  Output 950 ml  Net -352 ml        Physical Exam: Vital Signs Blood pressure 100/71, pulse 73, temperature 98.4 F (36.9 C), resp. rate 16, height 5\' 11"  (1.803 m), SpO2 98 %. Constitutional: No distress . Vital signs reviewed. HENT: Normocephalic.  Atraumatic. Eyes: EOMI. No discharge. Cardiovascular: No JVD.  RRR. Respiratory: Normal effort.  No stridor.  Bilateral clear to auscultation. GI: Non-distended.  BS +. Skin: Warm and dry.  Intact. Psych: Normal mood.  Normal behavior. Musc: No edema in extremities.  No tenderness in extremities. Neuro: Alert Motor: Bilateral upper extremities: >/4/5 throughout Left lower extremity: 4-4+/5 proximal distal Right lower extremity: 3+-4-/5 proximal distal, improving  Assessment/Plan: 1. Functional deficits which require 3+ hours per day of interdisciplinary therapy in a comprehensive inpatient rehab setting.  Physiatrist is providing close team supervision and 24 hour management of active medical problems listed below.  Physiatrist and rehab team continue to assess barriers to discharge/monitor patient progress toward functional and medical goals  Care Tool:  Bathing    Body parts bathed by patient: Right arm,Left  arm,Chest,Abdomen,Face,Front perineal area,Buttocks,Right upper leg,Left upper leg,Right lower leg,Left lower leg   Body parts bathed by helper: Buttocks,Right lower leg,Left lower leg     Bathing assist Assist Level: Supervision/Verbal cueing (standing with use of the grab bars for support)     Upper Body Dressing/Undressing Upper body dressing   What is the patient wearing?: Pull over shirt,Orthosis Orthosis activity level: Performed by helper  Upper body assist Assist Level: Contact Guard/Touching assist    Lower Body Dressing/Undressing Lower body dressing      What is the patient wearing?: Pants     Lower body assist Assist for lower body dressing: Minimal Assistance - Patient > 75% (supine in bed)     Toileting Toileting Toileting Activity did not occur (Clothing management and hygiene only): N/A (no void or bm) (did not need to void and in too much pain for toileting at eval)  Toileting assist Assist for toileting: Maximal Assistance - Patient 25 - 49% Assistive Device Comment: urinal sitting in chair   Transfers Chair/bed transfer  Transfers assist  Chair/bed transfer activity did not occur: Safety/medical concerns (unable due to pain)  Chair/bed transfer assist level: Supervision/Verbal cueing     Locomotion Ambulation   Ambulation assist   Ambulation activity did not occur: Safety/medical concerns (unable due to pain)  Assist level: Supervision/Verbal cueing Assistive device: Walker-rolling Max distance: 75'   Walk 10 feet activity   Assist     Assist level: Supervision/Verbal cueing Assistive device: Walker-rolling   Walk 50 feet activity   Assist    Assist level: Supervision/Verbal cueing Assistive device: Walker-rolling    Walk 150 feet activity   Assist  Assist level: Supervision/Verbal cueing Assistive device: Walker-rolling    Walk 10 feet on uneven surface  activity   Assist Walk 10 feet on uneven surfaces activity  did not occur: Safety/medical concerns (unable due to pain)         Wheelchair     Assist Will patient use wheelchair at discharge?: No Type of Wheelchair: Manual Wheelchair activity did not occur: Safety/medical concerns (unable due to pain)         Wheelchair 50 feet with 2 turns activity    Assist            Wheelchair 150 feet activity     Assist          Medical Problem List and Plan: 1.Lower extremity weakness right greater than left with numbness and decrease in sensationsecondary to failure of anterior and posterior instrumentation L5-S1 fusion from 09/19/2020. Status post lumbar reexploration and removal of instrumentation L5-S1 repeat decompression both L5 nerve roots with segmental fixation L4 09/27/2020. Pt demonstrates residual right L5 radiculopathy on exam. Back brace as directed  Continue CIR  Repeat DG spine showing for post surgical changes 2. 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 Pain Management:  Neurontin 400 mg 3 times daily, tylenol prn.   Lyrica 75 mg BID , increased to 100 3 times daily on 12/27  Added Lidoderm patches 2 patches 9pm to 9am  Increased Robaxin to 750mg  TID.  Increased Nucynta to 150 mg QID for pain on 12/29, again on 12/30  Valium 5 mg twice daily started on 12/30  Controlled with meds on 1/1 4. Mood:Provide emotional support -antipsychotic agents: N/A 5. Neuropsych: This patientiscapable of making decisions on hisown behalf. 6. Skin/Wound Care:Routine skin checks 7. Fluids/Electrolytes/Nutrition:Routine in and outs 8. Hypertension. HCTZ 12.5 mg daily DC'd on 12/28 due to soft blood pressures  Cozaar DC'd on 12/28  Soft, but asymptomatic on 1/1  Monitor with increased mobility   Vitals:   10/21/20 0608 10/21/20 1350  BP: 106/74 100/71  Pulse: 74 73   Resp: 18 16  Temp: 99.2 F (37.3 C) 98.4 F (36.9 C)  SpO2: 96% 98%   9. Hypothyroidism. Synthroid 10. Drug-induced constipation. Colace twice daily  Movantik 12.5 mg daily, increase to 25, 12/27  MiraLAX daily as needed  Patient plans to take daily 11. BPH with UTI  UA+, keflex changed to Bactrim DS for 5 days BID due to sensitivities 12.  Leukocytosis  WBC 10.6 on 12/22, labs ordered for Monday  See #11  Afebrile  LOS: 11 days A FACE TO FACE EVALUATION WAS PERFORMED  Alexander Rojas 10/21/2020, 5:09 PM

## 2020-10-22 ENCOUNTER — Inpatient Hospital Stay (HOSPITAL_COMMUNITY): Payer: 59

## 2020-10-22 DIAGNOSIS — G8918 Other acute postprocedural pain: Secondary | ICD-10-CM

## 2020-10-22 MED ORDER — POLYETHYLENE GLYCOL 3350 17 G PO PACK
17.0000 g | PACK | Freq: Every day | ORAL | Status: DC
  Administered 2020-10-22 – 2020-10-25 (×4): 17 g via ORAL
  Filled 2020-10-22 (×6): qty 1

## 2020-10-22 MED ORDER — BACITRACIN ZINC 500 UNIT/GM EX OINT
TOPICAL_OINTMENT | Freq: Two times a day (BID) | CUTANEOUS | Status: DC
  Filled 2020-10-22: qty 28.4

## 2020-10-22 NOTE — Progress Notes (Signed)
[facility] PHYSICAL MEDICINE & REHABILITATION PROGRESS NOTE   Subjective/Complaints: Patient seen laying in bed this AM.  He states he slept well overnight.  He notes urinary symptoms have resolved.  He is feeling well and looking forward to discharge soon.  He was seen by Neurosurg this AM, notes reviewed - doing well.   ROS: Denies CP, SOB, N/V/D  Objective:   No results found. No results for input(s): WBC, HGB, HCT, PLT in the last 72 hours. No results for input(s): NA, K, CL, CO2, GLUCOSE, BUN, CREATININE, CALCIUM in the last 72 hours.  Intake/Output Summary (Last 24 hours) at 10/22/2020 1154 Last data filed at 10/22/2020 0745 Gross per 24 hour  Intake 580 ml  Output 1550 ml  Net -970 ml        Physical Exam: Vital Signs Blood pressure 104/74, pulse 76, temperature 98.7 F (37.1 C), resp. rate 16, height 5\' 11"  (1.803 m), SpO2 95 %. Constitutional: No distress . Vital signs reviewed. HENT: Normocephalic.  Atraumatic. Eyes: EOMI. No discharge. Cardiovascular: No JVD.  RRR. Respiratory: Normal effort.  No stridor.  Bilateral clear to auscultation. GI: Non-distended.  BS +. Skin: Warm and dry.  Intact. Psych: Normal mood.  Normal behavior. Musc: No edema in extremities.  No tenderness in extremities. Neuro: Alert Motor: Bilateral upper extremities: >/4/5 throughout Left lower extremity: 4-4+/5 proximal distal Right lower extremity: 3+-4-/5 proximal distal, improving  Assessment/Plan: 1. Functional deficits which require 3+ hours per day of interdisciplinary therapy in a comprehensive inpatient rehab setting.  Physiatrist is providing close team supervision and 24 hour management of active medical problems listed below.  Physiatrist and rehab team continue to assess barriers to discharge/monitor patient progress toward functional and medical goals  Care Tool:  Bathing    Body parts bathed by patient: Right arm,Left arm,Chest,Abdomen,Face,Front perineal  area,Buttocks,Right upper leg,Left upper leg,Right lower leg,Left lower leg   Body parts bathed by helper: Buttocks,Right lower leg,Left lower leg     Bathing assist Assist Level: Supervision/Verbal cueing (standing with use of the grab bars for support)     Upper Body Dressing/Undressing Upper body dressing   What is the patient wearing?: Pull over shirt,Orthosis Orthosis activity level: Performed by helper  Upper body assist Assist Level: Contact Guard/Touching assist    Lower Body Dressing/Undressing Lower body dressing      What is the patient wearing?: Pants     Lower body assist Assist for lower body dressing: Minimal Assistance - Patient > 75% (supine in bed)     Toileting Toileting Toileting Activity did not occur (Clothing management and hygiene only): N/A (no void or bm) (did not need to void and in too much pain for toileting at eval)  Toileting assist Assist for toileting: Maximal Assistance - Patient 25 - 49% Assistive Device Comment: urinal sitting in chair   Transfers Chair/bed transfer  Transfers assist  Chair/bed transfer activity did not occur: Safety/medical concerns (unable due to pain)  Chair/bed transfer assist level: Supervision/Verbal cueing     Locomotion Ambulation   Ambulation assist   Ambulation activity did not occur: Safety/medical concerns (unable due to pain)  Assist level: Supervision/Verbal cueing Assistive device: Walker-rolling Max distance: 75'   Walk 10 feet activity   Assist     Assist level: Supervision/Verbal cueing Assistive device: Walker-rolling   Walk 50 feet activity   Assist    Assist level: Supervision/Verbal cueing Assistive device: Walker-rolling    Walk 150 feet activity   Assist  Assist level: Supervision/Verbal cueing Assistive device: Walker-rolling    Walk 10 feet on uneven surface  activity   Assist Walk 10 feet on uneven surfaces activity did not occur: Safety/medical concerns  (unable due to pain)         Wheelchair     Assist Will patient use wheelchair at discharge?: No Type of Wheelchair: Manual Wheelchair activity did not occur: Safety/medical concerns (unable due to pain)         Wheelchair 50 feet with 2 turns activity    Assist            Wheelchair 150 feet activity     Assist          Medical Problem List and Plan: 1.Lower extremity weakness right greater than left with numbness and decrease in sensationsecondary to failure of anterior and posterior instrumentation L5-S1 fusion from 09/19/2020. Status post lumbar reexploration and removal of instrumentation L5-S1 repeat decompression both L5 nerve roots with segmental fixation L4 09/27/2020. Pt demonstrates residual right L5 radiculopathy on exam. Back brace as directed  Continue CIR  Repeat DG spine showing for post surgical changes 2. 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 Pain Management:  Neurontin 400 mg 3 times daily, tylenol prn.   Lyrica 75 mg BID , increased to 100 3 times daily on 12/27  Added Lidoderm patches 2 patches 9pm to 9am  Increased Robaxin to 750mg  TID.  Increased Nucynta to 150 mg QID for pain on 12/29, again on 12/30  Valium 5 mg twice daily started on 12/30  Controlled with meds on 1/2 4. Mood:Provide emotional support -antipsychotic agents: N/A 5. Neuropsych: This patientiscapable of making decisions on hisown behalf. 6. Skin/Wound Care:Routine skin checks 7. Fluids/Electrolytes/Nutrition:Routine in and outs 8. Hypertension. HCTZ 12.5 mg daily DC'd on 12/28 due to soft blood pressures  Cozaar DC'd on 12/28  Controlled on 1/2  Monitor with increased mobility   Vitals:   10/21/20 2016 10/22/20 0545  BP: 113/70 104/74  Pulse: 82 76  Resp: 18 16  Temp: 98.9 F (37.2 C) 98.7 F  (37.1 C)  SpO2: 95% 95%   9. Hypothyroidism. Synthroid 10. Drug-induced constipation. Colace twice daily  Movantik 12.5 mg daily, increase to 25, 12/27  MiraLAX daily as needed  Bowel meds scheduled on 1/2 11. BPH with UTI  UA+, keflex changed to Bactrim DS for 5 days BID due to sensitivities 12.  Leukocytosis  WBC 10.6 on 12/22, labs ordered for tomorrow  See #11  Afebrile  LOS: 12 days A FACE TO FACE EVALUATION WAS PERFORMED  Allyse Fregeau 1/23 10/22/2020, 11:54 AM

## 2020-10-22 NOTE — Progress Notes (Signed)
I think he looks really good.  He is now walking with minimal assistance and often pain-free.  He had some pain in his legs yesterday while sitting in the recliner.  No numbness or tingling or weakness.  He seems to be progressing nicely.  Small area of fibrinous exudate in the small thoracic incision used for navigation.  We will ask the wound care nurses to treat this locally.  Does not look infected.  The lumbar incision looks great and is well-healed.

## 2020-10-22 NOTE — Consult Note (Signed)
WOC Nurse Consult Note: Reason for Consult: request received from Neurosurgeon Dr. Dub Amis for suggestions for care of thoracic wound Wound type:Surgical Pressure Injury POA: N/A Measurement: closely approximated, 3.8cm x 0.2cm x 0.1cm. Most proximal portion has minute separation, 0.2cm x 0.3cm x 0.1cm Wound YQI:HKVQ, moist Drainage (amount, consistency, odor) scant serous Periwound:intact with evidence of recently healed incision line Dressing procedure/placement/frequency:I will add a twice daily cleansing and application of bacitracin ointment to the incision line. This will be topped with a dry gauze 2x2 and covered with a silicone foam for atraumatic changes.  WOC nursing team will not follow, but will remain available to this patient, the nursing and medical teams.  Please re-consult if needed. Thanks, Ladona Mow, MSN, RN, GNP, [facility]  Pager# [phone]

## 2020-10-22 NOTE — Progress Notes (Signed)
Occupational Therapy Session Note  Patient Details  Name: Alexander Rojas MRN: [id number] Date of Birth: 03/21/1956  Today's Date: 10/22/2020 OT Individual Time: 1430-1500 OT Individual Time Calculation (min): 30 min    Short Term Goals: Week 2:  OT Short Term Goal 1 (Week 2): STGs = LTGs d/t ELOS  Skilled Therapeutic Interventions/Progress Updates:    Pt received supine with no c/o pain at rest. Pt eager to brush teeth and take shower. Bed mobility with CGA. Ambulatory transfer with RW to the sink with (S). Pain gradually increasing as pt was on his feet. He began brushing his teeth and found some relief in B knee flexion and reducing R LE weightbearing but the pain quickly become intolerable. He returned to supine in bed with mod A for lifting legs. Pt yelling out in pain during transfer back to bed and required a full minute of rest before he could speak through the pain again. Lengthy discussion re premedication, pain management techniques, and home set up/accessibility. Specifically discussed bed transfer and barrier of a very high bed with 2 steps to get into it. Recommended pt use shorter bed and consider removing box spring. Pt changed his clothes supine with increased assist 2/2 pain. Pt was left supine with all needs met. Pain subsided now in supine.   Therapy Documentation Precautions:  Precautions Precautions: Back Precaution Comments: reviewed back precautions Required Braces or Orthoses: Spinal Brace Spinal Brace: Applied in supine position,Lumbar corset Restrictions Weight Bearing Restrictions: No   Therapy/Group: Individual Therapy  Sandra H Davis 10/22/2020, 7:30 AM  

## 2020-10-23 ENCOUNTER — Inpatient Hospital Stay (HOSPITAL_COMMUNITY): Payer: 59 | Admitting: Occupational Therapy

## 2020-10-23 ENCOUNTER — Inpatient Hospital Stay (HOSPITAL_COMMUNITY): Payer: 59

## 2020-10-23 LAB — BASIC METABOLIC PANEL
Anion gap: 9 (ref 5–15)
BUN: 12 mg/dL (ref 8–23)
CO2: 25 mmol/L (ref 22–32)
Calcium: 8.5 mg/dL — ABNORMAL LOW (ref 8.9–10.3)
Chloride: 101 mmol/L (ref 98–111)
Creatinine, Ser: 1.01 mg/dL (ref 0.61–1.24)
GFR, Estimated: >60 mL/min (ref >60)
Glucose, Bld: 110 mg/dL — ABNORMAL HIGH (ref 70–99)
Potassium: 4 mmol/L (ref 3.5–5.1)
Sodium: 135 mmol/L (ref 135–145)

## 2020-10-23 MED ORDER — DIAZEPAM 5 MG PO TABS
7.5000 mg | ORAL_TABLET | Freq: Two times a day (BID) | ORAL | Status: DC
  Administered 2020-10-23 – 2020-10-27 (×7): 7.5 mg via ORAL
  Filled 2020-10-23 (×8): qty 2

## 2020-10-23 MED ORDER — PREGABALIN 75 MG PO CAPS
150.0000 mg | ORAL_CAPSULE | Freq: Three times a day (TID) | ORAL | Status: DC
  Administered 2020-10-23 – 2020-10-27 (×13): 150 mg via ORAL
  Filled 2020-10-23 (×13): qty 2

## 2020-10-23 NOTE — Progress Notes (Signed)
Patient ID: Alexander Rojas, male   DOB: 18-Jan-1956, 65 y.o.   MRN: [id number]   Hospital bed and rolling walker ordered through [facility].  Chair lift script provided to patient for pharmacy.  Lavera Guise, BSW

## 2020-10-23 NOTE — Progress Notes (Signed)
[facility] PHYSICAL MEDICINE & REHABILITATION PROGRESS NOTE   Subjective/Complaints:  Pt reports pain is "off the charts" at times-  Prednisone didn't help when took after surgery.   Can have BM when does enema, but not otherwise.   Can't put weight on R leg/on legs- and hard ot move legs due to pain- tramadol helps, but sit-stand is a real problem.  Will increase Valium  And do trigger point injections tomorrow- also increase Lyrica to 150 mg TID and can increase tramadol if needed.  Will also see if can extend him due to lack of support and inability to get sit-stand yet.    ROS:  Pt denies SOB, abd pain, CP, N/V/C/D, and vision changes   Objective:   No results found. No results for input(s): WBC, HGB, HCT, PLT in the last 72 hours. Recent Labs    10/23/20 0600  NA 135  K 4.0  CL 101  CO2 25  GLUCOSE 110*  BUN 12  CREATININE 1.01  CALCIUM 8.5*    Intake/Output Summary (Last 24 hours) at 10/23/2020 1108 Last data filed at 10/23/2020 0900 Gross per 24 hour  Intake 960 ml  Output 1550 ml  Net -590 ml        Physical Exam: Vital Signs Blood pressure 112/76, pulse 83, temperature 99.5 F (37.5 C), resp. rate 18, height 5\' 10"  (1.778 m), weight 80.6 kg, SpO2 93 %. Constitutional: sitting up in bed; appropriate, NAD HENT: Normocephalic.  Atraumatic. Eyes: conjugate gaze Cardiovascular: RRR Respiratory: CTA B/L- no W/R/R- good air movement GI: Soft, NT, ND, (+)BS  Skin: Warm and dry.  Intact except for thoracic wound- minute separation-   Psych: slightly depressed affect Musc: No edema in extremities.  No tenderness in extremities. Neuro: Alert Motor: Very TTP over R lateral buttocks- around PSIS.-  Bilateral upper extremities: >/4/5 throughout Left lower extremity: 4-4+/5 proximal distal Right lower extremity: 3+-4-/5 proximal distal, improving  Assessment/Plan: 1. Functional deficits which require 3+ hours per day of interdisciplinary therapy in a  comprehensive inpatient rehab setting.  Physiatrist is providing close team supervision and 24 hour management of active medical problems listed below.  Physiatrist and rehab team continue to assess barriers to discharge/monitor patient progress toward functional and medical goals  Care Tool:  Bathing    Body parts bathed by patient: Right arm,Left arm,Chest,Abdomen,Face,Front perineal area,Buttocks,Right upper leg,Left upper leg,Right lower leg,Left lower leg   Body parts bathed by helper: Buttocks,Right lower leg,Left lower leg     Bathing assist Assist Level: Supervision/Verbal cueing (standing with use of the grab bars for support)     Upper Body Dressing/Undressing Upper body dressing   What is the patient wearing?: Pull over shirt,Orthosis Orthosis activity level: Performed by helper  Upper body assist Assist Level: Contact Guard/Touching assist    Lower Body Dressing/Undressing Lower body dressing      What is the patient wearing?: Pants     Lower body assist Assist for lower body dressing: Minimal Assistance - Patient > 75% (supine in bed)     Toileting Toileting Toileting Activity did not occur (Clothing management and hygiene only): N/A (no void or bm) (did not need to void and in too much pain for toileting at eval)  Toileting assist Assist for toileting: Maximal Assistance - Patient 25 - 49% Assistive Device Comment: urinal sitting in chair   Transfers Chair/bed transfer  Transfers assist  Chair/bed transfer activity did not occur: Safety/medical concerns (unable due to pain)  Chair/bed transfer assist  level: Supervision/Verbal cueing     Locomotion Ambulation   Ambulation assist   Ambulation activity did not occur: Safety/medical concerns (unable due to pain)  Assist level: Supervision/Verbal cueing Assistive device: Walker-rolling Max distance: 75'   Walk 10 feet activity   Assist     Assist level: Supervision/Verbal cueing Assistive  device: Walker-rolling   Walk 50 feet activity   Assist    Assist level: Supervision/Verbal cueing Assistive device: Walker-rolling    Walk 150 feet activity   Assist    Assist level: Supervision/Verbal cueing Assistive device: Walker-rolling    Walk 10 feet on uneven surface  activity   Assist Walk 10 feet on uneven surfaces activity did not occur: Safety/medical concerns (unable due to pain)         Wheelchair     Assist Will patient use wheelchair at discharge?: No Type of Wheelchair: Manual Wheelchair activity did not occur: Safety/medical concerns (unable due to pain)         Wheelchair 50 feet with 2 turns activity    Assist            Wheelchair 150 feet activity     Assist          Medical Problem List and Plan: 1.Lower extremity weakness right greater than left with numbness and decrease in sensationsecondary to failure of anterior and posterior instrumentation L5-S1 fusion from 09/19/2020. Status post lumbar reexploration and removal of instrumentation L5-S1 repeat decompression both L5 nerve roots with segmental fixation L4 09/27/2020. Pt demonstrates residual right L5 radiculopathy on exam. Back brace as directed  Continue CIR  Repeat DG spine showing for post surgical changes  1/3- will see if can extend him due to continued pain/limiting function- maybe to 1/10? 2. 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 Pain Management:  Neurontin 400 mg 3 times daily, tylenol prn.   Lyrica 75 mg BID , increased to 100 3 times daily on 12/27  Added Lidoderm patches 2 patches 9pm to 9am  Increased Robaxin to 750mg  TID.  Increased Nucynta to 150 mg QID for pain on 12/29, again on 12/30  Valium 5 mg twice daily started on 12/30  1/3- pain "off the wall" so bad when transfers- will increase Valium to 7,5  mg BID and Lyrica increase to 150 mg TID- cannot increase Nucynta- will try Trigger point injection tomorrow.   4. Mood:Provide emotional support -antipsychotic agents: N/A 5. Neuropsych: This patientiscapable of making decisions on hisown behalf. 6. Skin/Wound Care:Routine skin checks 7. Fluids/Electrolytes/Nutrition:Routine in and outs 8. Hypertension. HCTZ 12.5 mg daily DC'd on 12/28 due to soft blood pressures  Cozaar DC'd on 12/28  Controlled on 1/2  Monitor with increased mobility   Vitals:   10/22/20 2000 10/23/20 0541  BP: 99/77 112/76  Pulse: 83 83  Resp: 18 18  Temp: 99.2 F (37.3 C) 99.5 F (37.5 C)  SpO2: 93% 93%   9. Hypothyroidism. Synthroid 10. Drug-induced constipation. Colace twice daily  Movantik 12.5 mg daily, increase to 25, 12/27  MiraLAX daily as needed  1/3- going with enema- cannot go in bedpan otherwise and cannot tolerate being on BSC 11. BPH with UTI  UA+, keflex changed to Bactrim DS for 5 days BID due to sensitivities  12/3- done with Bactrim 12.  Leukocytosis  WBC 10.6 on 12/22, labs ordered for tomorrow  See #11  Afebrile 13. Dispo  1/3- will see tomorrow if can extend  til 1/10  Of note, prednisone taper after surgery didn't help pain.   LOS: 13 days A FACE TO FACE EVALUATION WAS PERFORMED  Dawnya Grams 10/23/2020, 11:08 AM

## 2020-10-23 NOTE — Progress Notes (Signed)
Physical Therapy Session Note  Patient Details  Name: Alexander Rojas MRN: [id number] Date of Birth: [birth year]  Today's Date: 10/23/2020 PT Individual Time: 1105-1202 PT Individual Time Calculation (min): 57 min   Short Term Goals: Week 2:  PT Short Term Goal 1 (Week 2): STG=LTG due to ELOS  Skilled Therapeutic Interventions/Progress Updates:    Patient in supine reporting ready for session.  Noted from OT patient hoping to extend stay due to not feeling ready for home Thursday.  Discussed with pt possibly Friday or Saturday, but better to go home when wife will be there on the weekend.  Patient supine to sit S and donned brace seated leaning on L hip.  Sit to stand S and ambulated with RW x 120' to gym.  Standing on R to tap L to second step holding rails with facilitation for R hip stability in stance, 3 x 5 reps.  Standing stretch for R hip with foot on second step stretching into flexion then with heel on third step for hamstring stretch.  Negotiated 4 steps x 2 initially bilateral rails step over step to ascend, then reminded he has only one rail on R so descending with step to pattern sideways both hands to R rail. Then repeated with sideways technique.  Patient sit to supine on mat with CGA (to R side) to perform single knee to chest stretch with towel behind knee, lateral trunk rotation (knee rocking) and figure 4 piriformis stretch all 2 x 10-20 seconds.  Performed supine to sit with CGA coming up from the R.  Demonstrated floor transfer from standing and pt performed with S and mod cues, increased time.  Discussed plan for getting hospital bed at home and CSW informed.  Patient issued written HEP including SKTC, figure 4 piriformis stretch, low trunk rotation, hamstring stretch and sidelying clamshell hip abduction.  Patient ambulated x 300' with RW and S stopping at times due to R hip pain.  Patient sidelying in bed to apply kinesiotex tape to R hip IT to GT with 50% stretch with muscle on  stretch.  Patient back up to sit with S and ambulated to bathroom.  Left in bathroom on commode with call light in reach RN aware.   Therapy Documentation Precautions:  Precautions Precautions: Back Precaution Comments: reviewed back precautions Required Braces or Orthoses: Spinal Brace Spinal Brace: Applied in supine position,Lumbar corset Restrictions Weight Bearing Restrictions: No Pain: Pain Assessment Pain Scale: 0-10 Pain Score: 6  Pain Type: Acute pain Pain Location: Hip Pain Orientation: Right Pain Descriptors / Indicators: Aching Pain Frequency: Intermittent Pain Onset: With Activity Patients Stated Pain Goal: 0 Pain Intervention(s): Ambulation/increased activity;Rest;Other (Comment) (stretch)    Therapy/Group: Individual Therapy  [name]  [name], PT 10/23/2020, 11:24 AM

## 2020-10-23 NOTE — Progress Notes (Signed)
Occupational Therapy Session Note  Patient Details  Name: Alexander Rojas MRN: [id number] Date of Birth: 01/04/1956  Today's Date: 10/23/2020 OT Individual Time: 4166-0630 and 1601-0932 OT Individual Time Calculation (min): 63 min and 73 min   Short Term Goals: Week 1:  OT Short Term Goal 1 (Week 1): Pt will perform supine to sit EOB in prep for ADL with Min A OT Short Term Goal 1 - Progress (Week 1): Met OT Short Term Goal 2 (Week 1): Pt will perform sit to stand Mod A in prep for LB ADL OT Short Term Goal 2 - Progress (Week 1): Met OT Short Term Goal 3 (Week 1): Pt will perform BSC/toilet transfer with Mod A and LRAD OT Short Term Goal 3 - Progress (Week 1): Met OT Short Term Goal 4 (Week 1): Pt will don brace with no more than Mod A OT Short Term Goal 4 - Progress (Week 1): Met Week 2:  OT Short Term Goal 1 (Week 2): STGs = LTGs d/t ELOS  Skilled Therapeutic Interventions/Progress Updates:    Pt greeted at time of session supine in bed resting agreeable to OT session, no pain in supine but did have 8/10 pain later in session. RN performed med pass at beginning of session for premedication. Supine to sit on R side of bed as he will have at home with CGA, sit to stand Supervision and walked to sink level in same manner to perform oral hygiene in standing. Recommended having cups at home for rinsing mouth instead of forward hip flexion for comfort.  Walked room > ADL apartment > back to room with Supervision/CGA overall with RW for safety d/t inconsistent pain. In ADL apartment, reviewed IADL techniques for reaching for items in fridge, having wife place meal prepped items on higher shelves to decrease risk of inducing pain with forward reaching too far. Stand to sit EOM Supervision, sit > supine CGA, supine > prone CGA and pt achieved quadruped with Supervision, saying this is a comfortable position. In supine, performed hamstring/lower back stretches by bringing knees toward chest within pain  tolerance, stating this also made him comfortable. Ambulated back to room Supervision, transferred to toilet in same manner and had small BM, worked on hygiene within back precautions, able to reach in standing to clean. Toilet > bed close supervisoin with RW and sit to supine Min A. Call bell in reach all needs met.  ' Session 2: Pt greeted at time of session supine in bed resting, agreeable to OT session and wanting to know when pain meds due, spoke with RN who performed med pass at beginning of session. Supine to sit supervision today and ambulated to bathroom with CGA/close supervision and transferred into shower same manner. Doffed clothing with assist in standing as he has severe RLE pain in sitting, using reacher when needed to doff pants and therapist assist to get off feet. Doffed remaining clothes in standing and performed UB/LB bathing with supervision shower level with use of LHS as needed. Some discomfort during shower but manageable and able to tolerate, dried off with assist from waist down for time and discomfort. UB dress CGA, LB dress Min for threading and total A for socks for time management. Trialed sitting on various height chairs as well to determine most comfortable seat. Discussed DC planning and recommendations for home set up throughout session as well. Pt ambulated room <> gym CGA/close supervision and performed hip flexion/hamstring stretch in standing with large therapy ball to decrease  tightness for LB ADL. Pt also performed shoulder rolls, overhead reaches, and chest openers. Once back in room, sit to supine Supervisoin and call bell in reach all needs met.   Therapy Documentation Precautions:  Precautions Precautions: Back Precaution Comments: reviewed back precautions Required Braces or Orthoses: Spinal Brace Spinal Brace: Applied in supine position,Lumbar corset Restrictions Weight Bearing Restrictions: No      Therapy/Group: Individual Therapy  Viona Gilmore 10/23/2020, 12:19 PM

## 2020-10-24 ENCOUNTER — Inpatient Hospital Stay (HOSPITAL_COMMUNITY): Payer: 59

## 2020-10-24 ENCOUNTER — Inpatient Hospital Stay (HOSPITAL_COMMUNITY): Payer: 59 | Admitting: Occupational Therapy

## 2020-10-24 MED ORDER — LIDOCAINE HCL 1 % IJ SOLN
10.0000 mL | Freq: Once | INTRAMUSCULAR | Status: AC
  Administered 2020-10-24: 10 mL
  Filled 2020-10-24: qty 10

## 2020-10-24 NOTE — Progress Notes (Signed)
Patient ID: Alexander Rojas, male   DOB: [birth year], 65 y.o.   MRN: [id number] Team Conference Report to Patient/Family  Team Conference discussion was reviewed with the patient and caregiver, including goals, any changes in plan of care and target discharge date.  Patient and caregiver express understanding and are in agreement.  The patient has a target discharge date of 10/27/20.  Andria Rhein 10/24/2020, 1:03 PM

## 2020-10-24 NOTE — Discharge Summary (Signed)
Physician Discharge Summary  Patient ID: EDRIC Rojas MRN: [id number] DOB/AGE: October 10, 1956 65 y.o.  Admit date: 10/10/2020 Discharge date: 10/27/2020  Discharge Diagnoses:  Principal Problem:   Lumbar radiculopathy Active Problems:   Leukocytosis   Dysuria   Drug induced constipation   Neuropathic pain   Acute lower UTI   History of hypertension   Post-operative pain Age-indeterminate left small and external iliac DVT Hypothyroidism BPH/UTI  Discharged Condition: Stable  Significant Diagnostic Studies: DG Lumbar Spine 2-3 Views  Result Date: 10/26/2020 CLINICAL DATA:  Post lumbar fusion EXAM: LUMBAR SPINE - 2-3 VIEW COMPARISON:  10/17/2020 FINDINGS: Exam labeled with 5 lumbar vertebra. Prior posterior fusion of L4-S1 with additional screws traversing the SI joints and extending into the iliac bones bilaterally. Disc prosthesis at L5-S1, with again identified L5-S1 grade 2 spondylolisthesis. Osseous demineralization. LEFT L5 pedicle screw is at the superior margin of L5. No fracture or additional subluxation. Minimal dextroconvex lumbar scoliosis. Scattered endplate spur formation lower thoracic spine. IMPRESSION: Stable postoperative changes as above. Electronically Signed   By: Lavonia Dana M.D.   On: 10/26/2020 11:59   DG Lumbar Spine 2-3 Views  Result Date: 09/27/2020 CLINICAL DATA:  Back surgery EXAM: LUMBAR SPINE - 2-3 VIEW; DG C-ARM 1-60 MIN COMPARISON:  09/26/2020, CT 09/25/2020 FINDINGS: Three low resolution intraoperative spot views of the lumbar spine. Total fluoroscopy time was 17.9 seconds. Images demonstrate posterior rods and fixating screws from L4 through the sacrum with screws projecting over the bilateral iliac bones. Interbody device at L5-S1. screw tip at L5 projects over the superior endplate. IMPRESSION: Intraoperative fluoroscopic assistance provided during lumbar spine surgery. Electronically Signed   By: Donavan Foil M.D.   On: 09/27/2020 22:07   DG Lumbar Spine  1 View  Result Date: 10/17/2020 CLINICAL DATA:  Status post lumbar fusion EXAM: LUMBAR SPINE - 1 VIEW COMPARISON:  October 04, 2020 FINDINGS: There is again noted posterior screw and plate fixation from L4 through S2 with stable positioning of the pedicle screws. The pedicle screws at L5 track along the superior aspect of the L5 vertebral body. There is anterior fixation at L5 and S1, stable with discectomy at this level. There is no acute fracture. There is stable 16 mm of anterolisthesis of L5 on S1 with anterior and posterior fixation in this area. No other spondylolisthesis. There is disc space narrowing at T12-L1 and L1-2, stable. IMPRESSION: Postoperative change from L4-S2, stable, with anterior fusion and fixation at L5 and S1. Stable 16 mm of anterolisthesis of L5 on S1 with surgical fixation in this area. No new spondylolisthesis. No acute fracture. 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 Electronically Signed   By: Lowella Grip III M.D.   On: 10/17/2020 08:43   DG Lumbar Spine 1 View  Result Date: 10/04/2020 CLINICAL DATA:  Postoperative pain EXAM: LUMBAR SPINE - 1 VIEW COMPARISON:  09/27/2020, 09/25/2020 FINDINGS: No fracture of the lumbar spine. Redemonstrated findings of L5-S1 discectomy with with anterolisthesis of L5 on S1 and L4 through S2 posterior fusion. Findings are not significant changed compared to intraoperative fluoroscopic images dated 09/27/2020. No evidence of perihardware fracture. IMPRESSION: Redemonstrated findings of L5-S1 discectomy with with anterolisthesis of L5 on S1 and L4 through S2 posterior fusion. Findings are not significant changed compared to intraoperative fluoroscopic images dated 09/27/2020. No evidence of perihardware fracture. Electronically Signed   By: Eddie Candle M.D.   On: 10/04/2020 12:41   DG C-Arm 1-60 Min  Result Date: 09/27/2020 CLINICAL  DATA:  Back surgery EXAM: LUMBAR SPINE - 2-3 VIEW; DG C-ARM 1-60 MIN  COMPARISON:  09/26/2020, CT 09/25/2020 FINDINGS: Three low resolution intraoperative spot views of the lumbar spine. Total fluoroscopy time was 17.9 seconds. Images demonstrate posterior rods and fixating screws from L4 through the sacrum with screws projecting over the bilateral iliac bones. Interbody device at L5-S1. screw tip at L5 projects over the superior endplate. IMPRESSION: Intraoperative fluoroscopic assistance provided during lumbar spine surgery. Electronically Signed   By: Donavan Foil M.D.   On: 09/27/2020 22:07   VAS [place] LOWER EXTREMITY VENOUS (DVT)  Result Date: 10/11/2020  Lower Venous DVT Study Indications: Edema. Other Indications: Patient with recent spinal surgery has been limited in                    mobility since surgery roughly 3 weeks ago (per patient). Comparison Study: No prior studies. Performing Technologist: Rogelia Rohrer  Examination Guidelines: A complete evaluation includes B-mode imaging, spectral Doppler, color Doppler, and power Doppler as needed of all accessible portions of each vessel. Bilateral testing is considered an integral part of a complete examination. Limited examinations for reoccurring indications may be performed as noted. The reflux portion of the exam is performed with the patient in reverse Trendelenburg.  +---------+---------------+---------+-----------+----------+--------------+ RIGHT    CompressibilityPhasicitySpontaneityPropertiesThrombus Aging +---------+---------------+---------+-----------+----------+--------------+ CFV      Full           Yes      Yes                                 +---------+---------------+---------+-----------+----------+--------------+ SFJ      Full                                                        +---------+---------------+---------+-----------+----------+--------------+ FV Prox  Full           Yes      Yes                                  +---------+---------------+---------+-----------+----------+--------------+ FV Mid   Full           Yes      Yes                                 +---------+---------------+---------+-----------+----------+--------------+ FV DistalFull           Yes      Yes                                 +---------+---------------+---------+-----------+----------+--------------+ PFV      Full                                                        +---------+---------------+---------+-----------+----------+--------------+ POP      Full           Yes      Yes                                 +---------+---------------+---------+-----------+----------+--------------+  PTV      Full                                                        +---------+---------------+---------+-----------+----------+--------------+ PERO     Full                                                        +---------+---------------+---------+-----------+----------+--------------+   +----------+---------------+---------+-----------+----------+-----------------+ LEFT      CompressibilityPhasicitySpontaneityPropertiesThrombus Aging    +----------+---------------+---------+-----------+----------+-----------------+ CFV       Partial        Yes      Yes                  Age Indeterminate +----------+---------------+---------+-----------+----------+-----------------+ SFJ       Full                                                           +----------+---------------+---------+-----------+----------+-----------------+ FV Prox   Full           Yes      Yes                                    +----------+---------------+---------+-----------+----------+-----------------+ FV Mid    Full           Yes      Yes                                    +----------+---------------+---------+-----------+----------+-----------------+ FV Distal Full           Yes      Yes                                     +----------+---------------+---------+-----------+----------+-----------------+ PFV       Full                                                           +----------+---------------+---------+-----------+----------+-----------------+ POP       Full           Yes      Yes                                    +----------+---------------+---------+-----------+----------+-----------------+ PTV       Full                                                           +----------+---------------+---------+-----------+----------+-----------------+  PERO      Full                                                           +----------+---------------+---------+-----------+----------+-----------------+ EIV       Partial        Yes      Yes                  Age Indeterminate +----------+---------------+---------+-----------+----------+-----------------+ CIV - DISTFull                                                           +----------+---------------+---------+-----------+----------+-----------------+     Summary: RIGHT: - There is no evidence of deep vein thrombosis in the lower extremity.  - No cystic structure found in the popliteal fossa.  LEFT: - Findings consistent with age indeterminate deep vein thrombosis involving the left common femoral vein, and external iliac vein. - No cystic structure found in the popliteal fossa.  *See table(s) above for measurements and observations. Electronically signed by Gretta Began MD on 10/11/2020 at 8:00:10 PM.    Final     Labs:  Basic Metabolic Panel: Recent Labs  Lab 10/23/20 0600  NA 135  K 4.0  CL 101  CO2 25  GLUCOSE 110*  BUN 12  CREATININE 1.01  CALCIUM 8.5*    CBC: No results for input(s): WBC, NEUTROABS, HGB, HCT, MCV, PLT in the last 168 hours.  CBG: No results for input(s): GLUCAP in the last 168 hours.   Family history.  Positive for hypertension hyperlipidemia.  Denies any esophageal cancer rectal cancer or colon  cancer  Brief HPI:   Alexander Rojas is a 65 y.o. right-handed male with history of hypertension hypothyroidism macrocytosis BPH chronic back pain maintained on Nucynta with recent decompressive lumbar laminectomy and foraminotomies L5-S1 with anterior lumbar interbody fusion L5-S1 posterior fixation L5-S1 09/19/2020 per Dr. Marikay Alar for lytic spondylolisthesis with spinal stenosis foraminal stenosis and discharged to home.  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  1 level home with level entry.  Presented 09/26/2020 with complaints of severe right leg pain with movement onset of symptoms over the past few days rapidly worsening since that time.  X-rays and imaging revealed failure of anterior and posterior instrumentation after L5-S1 fusion.  Patient underwent lumbar reexploration with removal of instrumentation of L5-S1 repeat decompression of both L5 nerve roots intertransverse arthrodesis L4-S1 bilaterally with segmental fixation L4 to the ilium bilaterally 09/27/2020 per Dr. Marikay Alar.  Back brace when out of bed.  He was cleared to begin Lovenox for DVT prophylaxis.  Most recent CBC 09/30/2020 hemoglobin 11.9 WBC 12,100.  Due to patient decrease in functional overall mobility and weakness lower extremities he was admitted for a comprehensive rehab program.   Hospital Course: CHRISTAN DEFRANCO was admitted to rehab 10/10/2020 for inpatient therapies to consist of PT, ST and OT at least three hours five days a week. Past admission physiatrist, therapy team and rehab RN have worked together to provide customized collaborative inpatient rehab.  Pertaining to patient's radiculopathy failure of recent anterior posterior instrumentation L5-S1 fusion 09/19/2020 status post reexploration and removal instrumentation repeat decompression of both nerve roots L5 segmental fixation 09/27/2020.  Surgical site healing nicely  patient would follow Dr. Sherley Bounds.  Back brace when out of bed.  He did receive right lateral buttocks trigger point injections 10/24/2020.  Hospital course venous Doppler studies 10/11/2020 showed findings consistent with age-indeterminate deep vein thrombosis involving the left common femoral vein and external iliac vein.  Patient had been on Lovenox for DVT prophylaxis contact were made to neurosurgery patient placed on Xarelto x3 months.  Chronic pain management patient remained on Nucynta 150 mg 3 times daily as prior to admission as well as Lidoderm patch and Valium that have been scheduled twice daily.  Robaxin as needed muscle spasms.  His Lyrica had been titrated to 150 mg 3 times daily..  Blood pressure was a bit soft HCTZ and Cozaar had been discontinued these can be resumed as outpatient as needed.  Hypothyroidism with Synthroid ongoing.  Drug-induced constipation bowel program established.  BPH as well as UTI 20,000 Serratia initially on Keflex changed to Bactrim and course completed.   Blood pressures were monitored on TID basis and soft and monitored     Rehab course: During patient's stay in rehab weekly team conferences were held to monitor patient's progress, set goals and discuss barriers to discharge. At admission, patient required minimal assist 120 feet ambulation minimal assist stand pivot transfers max assist sit to side-lying.  Minimal assist upper body bathing max is lower body bathing max is set by dressing total assist lower body dressing  Physical exam.  Blood pressure 102/68 pulse 72 temperature 95 respirations 18 oxygen saturation 95% room air Constitutional.  No acute distress HEENT Head.  Normocephalic and atraumatic Eyes.  Pupils round and reactive to light no discharge.nystagmus Neck.  Supple nontender no JVD without thyromegaly Cardiac regular rate rhythm no extra sounds or murmur heard Abdomen.  Soft nontender positive bowel sounds without rebound Respiratory  effort normal no respiratory distress without wheeze Musculoskeletal normal range of motion Skin.  Back incision clean and dry essentially healed Neurologic.  Alert.  Oriented x3.  Follows commands.  Upper extremities 5/5.  Right lower extremity 3+/5 hip flexors inhibited by pain, 4+ knee extension and knee flexion, ADF 4/5 APF 5/5.  Left lower extremity 4 -/5 hip flexors 4+/5 knee extension knee flexion ankle dorsi plantarflexion.  Mild sensory loss along anterior right foot extending to anterior lateral leg  He/She  has had improvement in activity tolerance, balance, postural control as well as ability to compensate for deficits. He/She has had improvement in functional use RUE/LUE  and RLE/LLE as well as improvement in awareness.  Sit to stand supervision ambulates rolling walker 120 feet to the gym.  Negotiated 4 steps x2 initially bilateral rails step over step to ascend then reminded he only has one rail on right side descending with step to pattern sideways both hands to right rail.  Sit to supine on mat contact-guard assist.  Demonstrated for transfers from standing and patient performed with supervision moderate cues increased time.  He can increase his ambulation up to 300 feet with rolling walker.  Supine to sit on right side of bed as he will have at home with contact-guard assist sit to stand supervision walk to the sink level in same manner to perform oral hygiene and standing.  Walked from room to ADL apartment back to his room supervision  contact-guard assist.  In the ADL apartment independent ADL techniques for reaching for items in the fridge higher shelves to decrease risk of inducing pain.  Full family teaching completed plan discharge to home       Disposition: Discharged to home    Diet: Regular  Special Instructions: No driving smoking or alcohol  Back brace when out of bed  Medications at discharge 1.  Tylenol as needed 2.  Dulcolax suppository daily as needed 3.   Vitamin C 500 mg twice daily 4.  Os-Cal 1 tablet daily 5.  Valium 7.5 mg p.o. twice daily 6.  Colace 100 mg twice daily 7.  Synthroid 100 mcg p.o. before breakfast 8.  Lidoderm patch 2 patches change as directed 9.  Robaxin 750 mg p.o. every 8 hours 10.  Movantik 25 mg p.o. daily 11.  Vitamin A capsule 10,000 units daily 12.  MiraLAX daily hold for loose stools 13.  Lyrica 150 mg p.o. 3 times daily 14.  Xarelto 15 mg twice daily until 10/31/2020 15.  Xarelto 20 mg daily beginning 11/01/2020 16.  Nucynta 150 mg p.o. 3 times daily 17.  Tramadol 100 mg every 6 hours as needed moderate pain   30-35 minutes were spent completing discharge summary and discharge planning  Discharge Instructions    Ambulatory referral to Physical Medicine Rehab   Complete by: As directed    Moderate complexity follow-up 1 to 2 weeks lumbar radiculopathy       Follow-up Information    Lovorn, Jinny Blossom, MD Follow up.   Specialty: Physical Medicine and Rehabilitation Why: Office to call for appointment Contact information: [id number] [place] [address] [phone]        Eustace Moore, MD Follow up.   Specialty: Neurosurgery Why: Call for appointment Contact information: [address] [place] [address] [phone]               Signed: Lavon Paganini [place] 10/27/2020, 5:27 AM

## 2020-10-24 NOTE — Progress Notes (Signed)
Patient ID: Alexander Rojas, male   DOB: September 16, 1956, 65 y.o.   MRN: [id number]   Home health options provided to patient.   [place], [place] [phone]

## 2020-10-24 NOTE — Progress Notes (Signed)
Occupational Therapy Session Note  Patient Details  Name: Alexander Rojas MRN: [id number] Date of Birth: Nov 17, 1955  Today's Date: 10/24/2020 OT Individual Time: 1100-1158 OT Individual Time Calculation (min): 58 min    Short Term Goals: Week 2:  OT Short Term Goal 1 (Week 2): STGs = LTGs d/t ELOS  Skilled Therapeutic Interventions/Progress Updates:    Patient in bed, alert and ready for therapy .   He continues to have pain at buttocks and down right leg that is worse in sitting and walking.  He is able to donn LSO in supine with min cues.  Supine to sitting with CS.  He ambulates with RW to/from therapy gym with CS, occ resting breaks in stance.  Completed supine legs ROM and light stretching activities, able to roll to prone with CS and maintain back precautions.  Reviewed seating in home environment, discussed options for comfort and provided information.  He returned to bed at close of session, able to get legs into bed with CS.  Completed dowel arm exercises at bed level.  Call bell and tray table in reach.    Therapy Documentation Precautions:  Precautions Precautions: Back Precaution Comments: reviewed back precautions Required Braces or Orthoses: Spinal Brace Spinal Brace: Applied in supine position,Lumbar corset Restrictions Weight Bearing Restrictions: No   Therapy/Group: Individual Therapy  Barrie Lyme 10/24/2020, 7:36 AM

## 2020-10-24 NOTE — Patient Care Conference (Signed)
Inpatient RehabilitationTeam Conference and Plan of Care Update Date: 10/24/2020   Time: 11:57 AM    Patient Name: Alexander Rojas      Medical Record Number: [id number]  Date of Birth: 28-Aug-1956 Sex: Male         Room/Bed: 4M12C/4M12C-01 Payor Info: Payor: [employer] / Plan: [place] [employer] / Product Type: *No Product type* /    Admit Date/Time:  10/10/2020  4:26 PM  Primary Diagnosis:  Lumbar radiculopathy  Hospital Problems: Principal Problem:   Lumbar radiculopathy Active Problems:   Leukocytosis   Dysuria   Drug induced constipation   Neuropathic pain   Acute lower UTI   History of hypertension   Post-operative pain    Expected Discharge Date: Expected Discharge Date: 10/27/20  Team Members Present: Physician leading conference: Dr. Genice Rouge Care Coodinator Present: Chana Bode, RN, BSN, CRRN;Christina [place], BSW Nurse Present: Despina Hidden, RN PT Present: [name], PT OT Present: Earleen Newport, OT PPS Coordinator present : Fae Pippin, [place], PT     Current Status/Progress Goal Weekly Team Focus  Bowel/Bladder   Patient is continent of B/B, LBM 10/23/20  remain continent of B/B  QS/PRN assessment of toileting needs   Swallow/Nutrition/ Hydration             ADL's   ADL performance pending pain levels, shower level bathing Supervision standing (cannot sit d/t pain), LB dress Min A (cannot tolerate sitting for reacher), brace Supervision, functional mobility supervision, toileting and transfers CGA/Supervision pending pain  upgraded to Mod I, except Supervision for bathing  pain management and positioning, transitional movement, LB ADL with AE as tolerated, family ed   Mobility   S bed mobility, transfers, ambulation w/ RW up to 300', stairs with R rail and min A with LOTS of pain car transfers  mod I overall household  education, stretching, pain relieving positions, conditioning   Communication             Safety/Cognition/  Behavioral Observations            Pain      pain<3  AssessPain QS/PRN   Skin   Surgical incision to           Discharge Planning:  Discharging home with spouse (intermittent-evenings only) Multi-level home, B&B upstairs   Team Discussion: Patient reports pain when up; MD to adjust meds and administered Trigger Point injection 10/24/20. BP is soft; MD monitoring. Abx completed for UTI. Patient is continent of bowel and bladder; constipation addressed.  Patient on target to meet rehab goals: yes, currently supervision to [facility] for ADLs and min assist for lower body dressing with adaptive equipment. Supervision for transfers and able to ambulate up to 300' with rolling walker  *See Care Plan and progress notes for long and short-term goals.   Revisions to Treatment Plan:  Car transfers  Teaching Needs: Rest breaks and pain relief positions/stretching exercises with cues for pain control Transfers, toileting, medications, etc  Current Barriers to Discharge: Decreased caregiver support and Home enviroment access/layout  Possible Resolutions to Barriers: Hospital bed ordered for discharge Family had chairlift installed in home  Family education    Medical Summary Current Status: continent B/B- LBM yesterday on toilet; incision looks great now-  Barriers to Discharge: Decreased family/caregiver support;Home enviroment access/layout;Weight bearing restrictions;Wound care;Medical stability  Barriers to Discharge Comments: pain when sitting/standing is his biggest limiter- at rest, 0/10 pain; very high- with sitting- SW- got hospital bed; chair lift by  wife; possible w/c /RW. Possible Resolutions to [employer] Focus: Supervision with OT today- pain a little better before TrP injections- Supervision to max assist- depending on pain levels- walking 332ft with RW- takes breaks due to pain.  lots of cues/encouragement; goals Mod I- still working on car- 10/27/20 d/c-   Continued Need for  Acute Rehabilitation Level of Care: The patient requires daily medical management by a physician with specialized training in physical medicine and rehabilitation for the following reasons: Direction of a multidisciplinary physical rehabilitation program to maximize functional independence : Yes Medical management of patient stability for increased activity during participation in an intensive rehabilitation regime.: Yes Analysis of laboratory values and/or radiology reports with any subsequent need for medication adjustment and/or medical intervention. : Yes   I attest that I was present, lead the team conference, and concur with the assessment and plan of the team.   Dorien Chihuahua B 10/24/2020, 4:22 PM

## 2020-10-24 NOTE — Progress Notes (Signed)
PHYSICAL MEDICINE & REHABILITATION PROGRESS NOTE   Subjective/Complaints:  Pt reports at rest, his pain is 0/10- but as soon as sits up and puts pressure on Lateral hip/R lateral buttocks, his pain skyrockets.   LBM yesterday- on toilet, for first time.   Slept really well-  Thought did well in sessions, but pain is still main limiter.  Also got dressed in bed - first time on own.   ROS:  Pt denies SOB, abd pain, CP, N/V/C/D, and vision changes   Objective:   No results found. No results for input(s): WBC, HGB, HCT, PLT in the last 72 hours. Recent Labs    10/23/20 0600  NA 135  K 4.0  CL 101  CO2 25  GLUCOSE 110*  BUN 12  CREATININE 1.01  CALCIUM 8.5*    Intake/Output Summary (Last 24 hours) at 10/24/2020 1009 Last data filed at 10/24/2020 0848 Gross per 24 hour  Intake 665 ml  Output 350 ml  Net 315 ml        Physical Exam: Vital Signs Blood pressure 104/80, pulse 79, temperature 98.3 F (36.8 C), resp. rate 20, height 5\' 10"  (1.778 m), weight 80.6 kg, SpO2 94 %. Constitutional: laying in bed- appropriate, NAD_ it's his birthday- sang to him! HENT: Normocephalic.  Atraumatic. Eyes: conjugate gaze Cardiovascular: RRR Respiratory: CTA B/L- no W/R/R- good air movement GI: Soft, NT, ND, (+)BS  Skin: Warm and dry. Thoracic wound looks great- is open, but just superficially-  Psych: slightly depressed affect still today Musc: No edema in extremities.  No tenderness in extremities. Neuro: Alert Motor: Very TTP over R lateral buttocks- around PSIS.-still no change  Bilateral upper extremities: >/4/5 throughout Left lower extremity: 4-4+/5 proximal distal Right lower extremity: 3+-4-/5 proximal distal, improving  Assessment/Plan: 1. Functional deficits which require 3+ hours per day of interdisciplinary therapy in a comprehensive inpatient rehab setting.  Physiatrist is providing close team supervision and 24 hour management of active medical  problems listed below.  Physiatrist and rehab team continue to assess barriers to discharge/monitor patient progress toward functional and medical goals  Care Tool:  Bathing    Body parts bathed by patient: Right arm,Left arm,Chest,Abdomen,Face,Front perineal area,Buttocks,Right upper leg,Left upper leg,Right lower leg,Left lower leg   Body parts bathed by helper: Buttocks,Right lower leg,Left lower leg     Bathing assist Assist Level: Supervision/Verbal cueing (standing with use of grab bars)     Upper Body Dressing/Undressing Upper body dressing   What is the patient wearing?: Pull over shirt,Orthosis Orthosis activity level: Performed by helper  Upper body assist Assist Level: Contact Guard/Touching assist    Lower Body Dressing/Undressing Lower body dressing      What is the patient wearing?: Pants     Lower body assist Assist for lower body dressing: Minimal Assistance - Patient > 75%     Toileting Toileting Toileting Activity did not occur (Clothing management and hygiene only): N/A (no void or bm) (did not need to void and in too much pain for toileting at eval)  Toileting assist Assist for toileting: Minimal Assistance - Patient > 75% Assistive Device Comment: urinal sitting in chair   Transfers Chair/bed transfer  Transfers assist  Chair/bed transfer activity did not occur: Safety/medical concerns (unable due to pain)  Chair/bed transfer assist level: Supervision/Verbal cueing     Locomotion Ambulation   Ambulation assist   Ambulation activity did not occur: Safety/medical concerns (unable due to pain)  Assist level: Supervision/Verbal cueing Assistive device:  Walker-rolling Max distance: 300'   Walk 10 feet activity   Assist     Assist level: Supervision/Verbal cueing Assistive device: Walker-rolling   Walk 50 feet activity   Assist    Assist level: Supervision/Verbal cueing Assistive device: Walker-rolling    Walk 150 feet  activity   Assist    Assist level: Supervision/Verbal cueing Assistive device: Walker-rolling    Walk 10 feet on uneven surface  activity   Assist Walk 10 feet on uneven surfaces activity did not occur: Safety/medical concerns (unable due to pain)         Wheelchair     Assist Will patient use wheelchair at discharge?: No Type of Wheelchair: Manual Wheelchair activity did not occur: Safety/medical concerns (unable due to pain)         Wheelchair 50 feet with 2 turns activity    Assist            Wheelchair 150 feet activity     Assist          Medical Problem List and Plan: 1.Lower extremity weakness right greater than left with numbness and decrease in sensationsecondary to failure of anterior and posterior instrumentation L5-S1 fusion from 09/19/2020. Status post lumbar reexploration and removal of instrumentation L5-S1 repeat decompression both L5 nerve roots with segmental fixation L4 09/27/2020. Pt demonstrates residual right L5 radiculopathy on exam. Back brace as directed  Continue CIR  Repeat DG spine showing for post surgical changes  1/3- will see if can extend him due to continued pain/limiting function- maybe to 1/10? 2. 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 Pain Management:  Neurontin 400 mg 3 times daily, tylenol prn.   Lyrica 75 mg BID , increased to 100 3 times daily on 12/27  Added Lidoderm patches 2 patches 9pm to 9am  Increased Robaxin to 750mg  TID.  Increased Nucynta to 150 mg QID for pain on 12/29, again on 12/30  Valium 5 mg twice daily started on 12/30  1/3- pain "off the wall" so bad when transfers- will increase Valium to 7,5 mg BID and Lyrica increase to 150 mg TID- cannot increase Nucynta- will try Trigger point injection tomorrow.    1/4- will do trigger point injections today of  R PSIS area 4. Mood:Provide emotional support -antipsychotic agents: N/A 5. Neuropsych: This patientiscapable of making decisions on hisown behalf. 6. Skin/Wound Care:Routine skin checks 7. Fluids/Electrolytes/Nutrition:Routine in and outs 8. Hypertension. HCTZ 12.5 mg daily DC'd on 12/28 due to soft blood pressures  Cozaar DC'd on 12/28  1/4- BP controlled- con't regimen- is a little soft, but asymptomatic  Monitor with increased mobility   Vitals:   10/23/20 1946 10/24/20 0554  BP: 102/78 104/80  Pulse: 77 79  Resp: 20 20  Temp: 98.5 F (36.9 C) 98.3 F (36.8 C)  SpO2: 96% 94%   9. Hypothyroidism. Synthroid 10. Drug-induced constipation. Colace twice daily  Movantik 12.5 mg daily, increase to 25, 12/27  MiraLAX daily as needed  1/3- going with enema- cannot go in bedpan otherwise and cannot tolerate being on BSC  1/4- had first BM on toilet 11. BPH with UTI  UA+, keflex changed to Bactrim DS for 5 days BID due to sensitivities  12/3- done with Bactrim 12.  Leukocytosis  WBC 10.6 on 12/22, labs ordered for tomorrow  See #11  Afebrile 13. Dispo  1/3- will see tomorrow if can extend til 1/10  Of note, prednisone  taper after surgery didn't help pain.   I did trigger point injections of R lateral buttocks- R PSIS, slightly laterally.  Went over risks/benefits and consent signed- then cleaned with alcohol- then injected using 27 gauge 1.5 inch needle- used 3cc of 1% Lidocaine with no EPI- injected into tight band of muscle- felt like injecting gluteus minimus, based on on location. t will be able to tell if helped with therapy in  1 hr.    I spent a total of 35 minutes on total care secondary to 2 visits and trigger point injections- >50% coordination of care.   LOS: 14 days A FACE TO FACE EVALUATION WAS PERFORMED  Serrita Lueth 10/24/2020, 10:09 AM

## 2020-10-24 NOTE — Progress Notes (Signed)
Physical Therapy Session Note  Patient Details  Name: Alexander Rojas MRN: [id number] Date of Birth: Jan 25, 1956  Today's Date: 10/24/2020 PT Individual Time: 1300-1411 PT Individual Time Calculation (min): 71 min   Short Term Goals: Week 2:  PT Short Term Goal 1 (Week 2): STG=LTG due to ELOS  Skilled Therapeutic Interventions/Progress Updates:    Patient in supine and reports pain worse today.  Had injections with MD this morning, but now feeling both hips in pain.  Supine stretches single knee to chest and hamstring using towel then gait belt to help with stretches.  Patient supine to sit with S.  Sit to stand S and pt ambulated 150' with RW and S stopping x 1 for pain management.  Negotiated 4 steps with rail and cane and S with cues, but reports increased pain over sideways technique so practiced with sideways technique with S and pt reports better tolerance.  Patient sit to supine on mat to R CGA for legs.  Performed lateral trunk rolls on red therapy ball, rollouts with double knee to chest x 10, in sidelying on L manual therapy to R hip consisting of soft tissue work/myofascial release using bolster as foam roller, to R glut medius, over hamstring and over IT band. Performed manual release as well with soft tissue massage.  Patient performed clamshell hip abduction, then sidelying abduction with leg extended x 10 each, supine bridge with cues for glut activation x 10, SLR x 10 each leg then R sidelying hip abduction x10.  RN in to deliver pain medication during session.  Side to sit with S.  Patient ambulated to ADL apartment and discussed both hospital bed expectations and plan for using dishwasher top rack instead of kneeling to lower rack.  Patient requested return to room.  Ambulated to room 78' with S and RW with improved pain relief after standing trunk extensions at counter in kitchen. Patient returned to supine with S in bed.  Recounted methods for pain relief at home and for help at home.   Patient left in supine with call light and needs in reach.  Took HEP to refresh with hip strengthening exercises.    Therapy Documentation Precautions:  Precautions Precautions: Back Precaution Comments: reviewed back precautions Required Braces or Orthoses: Spinal Brace Spinal Brace: Applied in supine position,Lumbar corset Restrictions Weight Bearing Restrictions: No Pain: Pain Assessment Pain Score: 9  Pain Type: Acute pain Pain Location: Hip Pain Orientation: Right;Left;Posterior Pain Descriptors / Indicators: Spasm Pain Onset: With Activity Pain Intervention(s): Repositioned;Rest    Therapy/Group: Individual Therapy  [name]  [place], [place] 10/24/2020, 1:23 PM

## 2020-10-24 NOTE — Progress Notes (Signed)
Occupational Therapy Session Note  Patient Details  Name: Alexander Rojas MRN: [id number] Date of Birth: [birth year]  Today's Date: 10/24/2020 OT Individual Time: 7001-7494 OT Individual Time Calculation (min): 59 min    Short Term Goals: Week 2:  OT Short Term Goal 1 (Week 2): STGs = LTGs d/t ELOS   Skilled Therapeutic Interventions/Progress Updates:    Pt greeted at time of session supine in bed resting agreeable to OT session, supine to sit Supervision but returned to supine in same manner per pt request to don brace, but educated that he can put it on EOB but pt wanted to try in supine with rolling L/R. Supine to sit Supervision, sit to stand and walked to sink in same manner. Oral hygiene standing with Supervision, performed grooming in same manner. Less pain today with reaching for objects such as toothbrush and comb. Functional mobility throughout room CGA/Supervision today with RW. Trialed sitting in straight back standard height chair with roho cushion and arm rests, able to tolerance approx 10-15 minutes with occasional back spasms, but able to tolerate with rest breaks. Donned TEDS dependent for time management, able to don socks with Min/Mod A with sock aide. Donned pants with reacher and Min A only to get feet through as they were sticking to pant legs. UB dress CGA for shirt and brace, cues for straps as they tend to get tangled. Continued to discuss DC planning throughout session and compensatory techniques for home. Provided walker bag as well and ensured pt could perform hip flexion to reach for items as needed. Back in bed sit to supine supervision, call bell in reach.   Therapy Documentation Precautions:  Precautions Precautions: Back Precaution Comments: reviewed back precautions Required Braces or Orthoses: Spinal Brace Spinal Brace: Applied in supine position,Lumbar corset Restrictions Weight Bearing Restrictions: No     Therapy/Group: Individual Therapy  Erasmo Score 10/24/2020, 12:12 PM

## 2020-10-25 ENCOUNTER — Inpatient Hospital Stay (HOSPITAL_COMMUNITY): Payer: 59

## 2020-10-25 ENCOUNTER — Other Ambulatory Visit (HOSPITAL_COMMUNITY): Payer: Self-pay | Admitting: Physician Assistant

## 2020-10-25 ENCOUNTER — Inpatient Hospital Stay (HOSPITAL_COMMUNITY): Payer: 59 | Admitting: Occupational Therapy

## 2020-10-25 MED ORDER — CALCIUM CARBONATE-VITAMIN D 500-200 MG-UNIT PO TABS: 1.0000 | ORAL_TABLET | Freq: Every day | ORAL | 0 refills | Status: AC

## 2020-10-25 MED ORDER — VITAMIN A 3 MG (10000 UNIT) PO CAPS: 10000.0000 [IU] | ORAL_CAPSULE | Freq: Every day | ORAL | 0 refills | Status: DC

## 2020-10-25 MED ORDER — DIAZEPAM 5 MG PO TABS: 7.5000 mg | ORAL_TABLET | Freq: Two times a day (BID) | ORAL | 0 refills | Status: DC

## 2020-10-25 MED ORDER — DIAZEPAM 5 MG PO TABS
5.0000 mg | ORAL_TABLET | Freq: Once | ORAL | Status: AC
  Administered 2020-10-25: 5 mg via ORAL

## 2020-10-25 MED ORDER — POLYETHYLENE GLYCOL 3350 17 G PO PACK: 17.0000 g | PACK | Freq: Every day | ORAL | 0 refills | Status: DC

## 2020-10-25 MED ORDER — PREGABALIN 150 MG PO CAPS: 150.0000 mg | ORAL_CAPSULE | Freq: Three times a day (TID) | ORAL | 0 refills | Status: DC

## 2020-10-25 MED ORDER — ASCORBIC ACID 500 MG PO TABS: 500.0000 mg | ORAL_TABLET | Freq: Two times a day (BID) | ORAL | Status: DC

## 2020-10-25 MED ORDER — LIDOCAINE 5 % EX PTCH: 2.0000 | MEDICATED_PATCH | CUTANEOUS | 0 refills | Status: DC

## 2020-10-25 MED ORDER — RIVAROXABAN 15 MG PO TABS: 15.0000 mg | ORAL_TABLET | Freq: Two times a day (BID) | ORAL | 0 refills | Status: DC

## 2020-10-25 MED ORDER — TRAMADOL HCL 50 MG PO TABS: 100.0000 mg | ORAL_TABLET | Freq: Four times a day (QID) | ORAL | 0 refills | Status: DC | PRN

## 2020-10-25 MED ORDER — METHOCARBAMOL 750 MG PO TABS: 750.0000 mg | ORAL_TABLET | Freq: Three times a day (TID) | ORAL | 0 refills | Status: DC

## 2020-10-25 MED ORDER — NALOXEGOL OXALATE 25 MG PO TABS: 25.0000 mg | ORAL_TABLET | Freq: Every day | ORAL | 0 refills | Status: DC

## 2020-10-25 MED ORDER — DOCUSATE SODIUM 100 MG PO CAPS: 100.0000 mg | ORAL_CAPSULE | Freq: Two times a day (BID) | ORAL | 0 refills | Status: DC

## 2020-10-25 MED ORDER — ACETAMINOPHEN 325 MG PO TABS: 650.0000 mg | ORAL_TABLET | ORAL | Status: DC | PRN

## 2020-10-25 MED ORDER — RIVAROXABAN 20 MG PO TABS: 20.0000 mg | ORAL_TABLET | Freq: Every day | ORAL | 0 refills | Status: DC

## 2020-10-25 MED ORDER — TAPENTADOL HCL 75 MG PO TABS: 150.0000 mg | ORAL_TABLET | Freq: Three times a day (TID) | ORAL | 0 refills | Status: DC

## 2020-10-25 MED FILL — XARELTO 15 MG TABLET: 15 | 5 days supply | Qty: 10 | Fill #0

## 2020-10-25 MED FILL — diazePAM 5 MG TABS: 5 | 15 days supply | Qty: 30 | Fill #0

## 2020-10-25 MED FILL — CALCIUM 600-VIT D3 400 TAB: 600-400 | 30 days supply | Qty: 30 | Fill #0

## 2020-10-25 MED FILL — VITAMIN A 10,000 UNITS CAP: 3 MG | 30 days supply | Qty: 30 | Fill #0

## 2020-10-25 MED FILL — PREGABALIN 150 MG CAPS: 150 | 30 days supply | Qty: 90 | Fill #0

## 2020-10-25 MED FILL — METHOCARBAMOL 750 MG TABS: 750 | 30 days supply | Qty: 90 | Fill #0

## 2020-10-25 MED FILL — traMADol HCL 50 MG TABS: 50 | 5 days supply | Qty: 30 | Fill #0

## 2020-10-25 MED FILL — XARELTO 20 MG TABLET: 20 | 30 days supply | Qty: 30 | Fill #0

## 2020-10-25 MED FILL — NUCYNTA 75 MG TABS: 75 | 5 days supply | Qty: 30 | Fill #0

## 2020-10-25 NOTE — Progress Notes (Signed)
Occupational Therapy Session Note  Patient Details  Name: Alexander Rojas MRN: [id number] Date of Birth: Mar 19, 1956  Today's Date: 10/25/2020 OT Individual Time: 9244-6286 OT Individual Time Calculation (min): 71 min    Short Term Goals: Week 2:  OT Short Term Goal 1 (Week 2): STGs = LTGs d/t ELOS   Skilled Therapeutic Interventions/Progress Updates:    Pt greeted at time of session supine in bed resting, agreeable to OT session and no pain at rest. Performed LB hamstring and glute stretches with use of towel and gait belt for carryover at home prior to activity to determine if helps pain levels. Supine to sit EOB Supervision and donned brace in same manner. Pt agreeable to ADL this morning, walked to sink Supervision with RW and performed oral hygiene standing in same manner, continues to have increased pain when reaching forward for items. D/t high pain levels (approx 7 to 8/10) and premedicated approx 15 minutes before session, pt wanting to walk, walked room <> gym with Supervision, few rest breaks for pain to subside. Once back in room, trialed several positions for pain relief including tennis ball under medial aspect of hip for piriformis release and decrease tension/muscle spasm, offloading in standing, etc with minimal relief. UB/LB dressing with supervision for UB and Supervision for LB today with use of reacher. Sit to supine CGA and call bell in reach all needs met.   Therapy Documentation Precautions:  Precautions Precautions: Back Precaution Comments: reviewed back precautions Required Braces or Orthoses: Spinal Brace Spinal Brace: Applied in supine position,Lumbar corset Restrictions Weight Bearing Restrictions: No     Therapy/Group: Individual Therapy  Viona Gilmore 10/25/2020, 11:40 AM

## 2020-10-25 NOTE — Progress Notes (Signed)
Verbal order given for 1x order, 5mg  Order Valium by , PA, per request of the pt.

## 2020-10-25 NOTE — Progress Notes (Signed)
Physical Therapy Session Note  Patient Details  Name: Alexander Rojas MRN: [id number] Date of Birth: 06-Nov-1955  Today's Date: 10/25/2020 PT Individual Time: 1445-1600 PT Individual Time Calculation (min): 75 min   Short Term Goals: Week 2:  PT Short Term Goal 1 (Week 2): STG=LTG due to ELOS  Skilled Therapeutic Interventions/Progress Updates:    Patient in supine and reports feels like starting all over today with pain.  Performed supine to sit to stand with S and brace already donned.  Patient ambulated to therapy gym 150' with RW and S and stopped once to rest cues for extension for pain relief.  Patient sit to supine on mat with S.  Performed HEP as issued on 1/4, to include low trunk rotation in limited range to avoid twisting with LE's on ball, double knee to chest with LE's on ball also in limited ROM to avoid bending, single knee to chest with strap behind knee 2 x 20 sec hold, then hamstring stretch with strap x 2 x 20 sec hold, figure 4 piriformis stretch and reviewed nerve glide technique with handout as well.  Sidelying clamshell hip abduction with green t-band x 10 each leg, sidelying hip abduction with leg extended and no resistance with cues for positioning, supine SLR and bridge all x 10.  Performed prone lying for pain relief and placed tennis ball to L hip in different positions for myofascial release technique and for educating patient in technique.  Patient ambulated to ortho gym with RW and S for using BITS for balance with A-Z then using dots over screen all with only intermittent UE support.  Ambulated back to room with S and sit to supine with S.  Doffed brace in supine and left with needs in reach.   Therapy Documentation Precautions:  Precautions Precautions: Back Precaution Comments: reviewed back precautions Required Braces or Orthoses: Spinal Brace Spinal Brace: Applied in supine position,Lumbar corset Restrictions Weight Bearing Restrictions: No Pain: Pain  Assessment Pain Score: 10-Worst pain ever Pain Type: Acute pain Pain Location: Hip Pain Orientation: Right Pain Descriptors / Indicators: Spasm Pain Onset: With Activity Pain Intervention(s): Repositioned;Emotional support;Rest    Therapy/Group: Individual Therapy  [name]  [name], PT 10/25/2020, 5:31 PM

## 2020-10-25 NOTE — Progress Notes (Signed)
Occupational Therapy Session Note  Patient Details  Name: Alexander Rojas MRN: [id number] Date of Birth: 10/16/1956  Today's Date: 10/25/2020 OT Individual Time: 3267-1245 OT Individual Time Calculation (min): 40 min    Short Term Goals: Week 2:  OT Short Term Goal 1 (Week 2): STGs = LTGs d/t ELOS  Skilled Therapeutic Interventions/Progress Updates:    Pt resting in bed upon arrival and agreeable to getting OOB. Supine>sit EOB with supervision adhering to back precautions. Amb with RW with supervision. After approx 20', pt stated the pain in his Rt buttocks increased exponentially and was radiating down his LE. Pt continue to walk towards Day Room, stating that frequently walking helped alleviate pain. Pain continued to escalate and pt returned to room from Day Room. Pt required assistance lifting BLE onto bed. Demonstrated walk in shower transfer with RW. Discussed DME for shower and handouts provided. Pt remained in bed with all needs within reach.   Therapy Documentation Precautions:  Precautions Precautions: Back Precaution Comments: reviewed back precautions Required Braces or Orthoses: Spinal Brace Spinal Brace: Applied in supine position,Lumbar corset Restrictions Weight Bearing Restrictions: No  Pain: Pt c/o R hip/gluteal pain (3/10) at rest in bed. Pain escalated to 20/10 with ambulation. Pt returned to bed after walking (pt states that walking usually relieved the pain). Pt reported minimal relief laying supine. RN notified of request for pain meds.   Therapy/Group: Individual Therapy  Rich Brave 10/25/2020, 12:11 PM

## 2020-10-25 NOTE — Progress Notes (Signed)
[place] PHYSICAL MEDICINE & REHABILITATION PROGRESS NOTE   Subjective/Complaints:  Pt reports feels good at rest- not OOB yet.  Might be less sore, but hasn't been up to determine yet- did sleep on side last night, which is better- LBM on toilet yesterday-  Wants meds to go to [place] ROS:  Pt denies SOB, abd pain, CP, N/V/C/D, and vision changes   Objective:   No results found. No results for input(s): WBC, HGB, HCT, PLT in the last 72 hours. Recent Labs    10/23/20 0600  NA 135  K 4.0  CL 101  CO2 25  GLUCOSE 110*  BUN 12  CREATININE 1.01  CALCIUM 8.5*    Intake/Output Summary (Last 24 hours) at 10/25/2020 0809 Last data filed at 10/25/2020 0801 Gross per 24 hour  Intake 1340 ml  Output 2475 ml  Net -1135 ml        Physical Exam: Vital Signs Blood pressure (!) 98/58, pulse 100, temperature 98.7 F (37.1 C), resp. rate 18, height 5\' 10"  (1.778 m), weight 80.6 kg, SpO2 96 %. Constitutional: laying on bed- appropriate, bright affect, NAD HENT: Normocephalic.  Atraumatic. Eyes: conjugate gaze Cardiovascular: RRR Respiratory: CTA B/L- no W/R/R- good air movement GI: Soft, NT, ND, (+)BS  Skin: Warm and dry. Thoracic wound looks great- is open, but just superficially- not assessed today Psych: brighter affect today Musc: No edema in extremities.  No tenderness in extremities. Neuro: Alert Motor: Less TTP over R gluteus minimus- less taut- and less TTP  Bilateral upper extremities: >/4/5 throughout Left lower extremity: 4-4+/5 proximal distal Right lower extremity: 3+-4-/5 proximal distal, improving  Assessment/Plan: 1. Functional deficits which require 3+ hours per day of interdisciplinary therapy in a comprehensive inpatient rehab setting.  Physiatrist is providing close team supervision and 24 hour management of active medical problems listed below.  Physiatrist and rehab team continue to assess barriers to discharge/monitor patient  progress toward functional and medical goals  Care Tool:  Bathing    Body parts bathed by patient: Right arm,Left arm,Chest,Abdomen,Face,Front perineal area,Buttocks,Right upper leg,Left upper leg,Right lower leg,Left lower leg   Body parts bathed by helper: Buttocks,Right lower leg,Left lower leg     Bathing assist Assist Level: Supervision/Verbal cueing (standing with use of grab bars)     Upper Body Dressing/Undressing Upper body dressing   What is the patient wearing?: Pull over shirt,Orthosis Orthosis activity level: Performed by helper  Upper body assist Assist Level: Contact Guard/Touching assist    Lower Body Dressing/Undressing Lower body dressing      What is the patient wearing?: Pants     Lower body assist Assist for lower body dressing: Minimal Assistance - Patient > 75%     Toileting Toileting Toileting Activity did not occur (Clothing management and hygiene only): N/A (no void or bm) (did not need to void and in too much pain for toileting at eval)  Toileting assist Assist for toileting: Minimal Assistance - Patient > 75% Assistive Device Comment: urinal sitting in chair   Transfers Chair/bed transfer  Transfers assist  Chair/bed transfer activity did not occur: Safety/medical concerns (unable due to pain)  Chair/bed transfer assist level: Supervision/Verbal cueing     Locomotion Ambulation   Ambulation assist   Ambulation activity did not occur: Safety/medical concerns (unable due to pain)  Assist level: Supervision/Verbal cueing Assistive device: Walker-rolling Max distance: 150'   Walk 10 feet activity   Assist     Assist level: Supervision/Verbal cueing Assistive  device: Walker-rolling   Walk 50 feet activity   Assist    Assist level: Supervision/Verbal cueing Assistive device: Walker-rolling    Walk 150 feet activity   Assist    Assist level: Supervision/Verbal cueing Assistive device: Walker-rolling    Walk 10  feet on uneven surface  activity   Assist Walk 10 feet on uneven surfaces activity did not occur: Safety/medical concerns (unable due to pain)         Wheelchair     Assist Will patient use wheelchair at discharge?: No Type of Wheelchair: Manual Wheelchair activity did not occur: Safety/medical concerns (unable due to pain)         Wheelchair 50 feet with 2 turns activity    Assist            Wheelchair 150 feet activity     Assist          Medical Problem List and Plan: 1.Lower extremity weakness right greater than left with numbness and decrease in sensationsecondary to failure of anterior and posterior instrumentation L5-S1 fusion from 09/19/2020. Status post lumbar reexploration and removal of instrumentation L5-S1 repeat decompression both L5 nerve roots with segmental fixation L4 09/27/2020. Pt demonstrates residual right L5 radiculopathy on exam. Back brace as directed  Continue CIR  Repeat DG spine showing for post surgical changes  1/3- will see if can extend him due to continued pain/limiting function- maybe to 1/10?  1/5- extended to 1/7.  2. 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 Pain Management:  Neurontin 400 mg 3 times daily, tylenol prn.   Lyrica 75 mg BID , increased to 100 3 times daily on 12/27  Added Lidoderm patches 2 patches 9pm to 9am  Increased Robaxin to 750mg  TID.  Increased Nucynta to 150 mg QID for pain on 12/29, again on 12/30  Valium 5 mg twice daily started on 12/30  1/3- pain "off the wall" so bad when transfers- will increase Valium to 7,5 mg BID and Lyrica increase to 150 mg TID- cannot increase Nucynta- will try Trigger point injection tomorrow.    1/5- trigger points done yesterday- might have improved pain- will see when gets OOB.  4. Mood:Provide emotional  support -antipsychotic agents: N/A 5. Neuropsych: This patientiscapable of making decisions on hisown behalf. 6. Skin/Wound Care:Routine skin checks 7. Fluids/Electrolytes/Nutrition:Routine in and outs 8. Hypertension. HCTZ 12.5 mg daily DC'd on 12/28 due to soft blood pressures  Cozaar DC'd on 12/28  1/4- BP controlled- con't regimen- is a little soft, but asymptomatic 1/5- BP a little low- 98/50s- not on any BP meds anymore.   Monitor with increased mobility   Vitals:   10/25/20 0543 10/25/20 0549  BP: 107/73 (!) 98/58  Pulse: 81 100  Resp: 16 18  Temp: 98.7 F (37.1 C) 98.7 F (37.1 C)  SpO2: 96%    9. Hypothyroidism. Synthroid 10. Drug-induced constipation. Colace twice daily  Movantik 12.5 mg daily, increase to 25, 12/27  MiraLAX daily as needed  1/3- going with enema- cannot go in bedpan otherwise and cannot tolerate being on BSC  1/5- had BM on toilet again yesterday 11. BPH with UTI  UA+, keflex changed to Bactrim DS for 5 days BID due to sensitivities  12/3- done with Bactrim 12.  Leukocytosis  WBC 10.6 on 12/22, labs ordered for tomorrow  See #11  Afebrile 13. Dispo  1/3- will see tomorrow if can extend til 1/10  Of note, prednisone taper after surgery didn't help pain.   1/5- will extend to 1/7  I did trigger point injections of R lateral buttocks- R PSIS, slightly laterally.  Went over risks/benefits and consent signed- then cleaned with alcohol- then injected using 27 gauge 1.5 inch needle- used 3cc of 1% Lidocaine with no EPI- injected into tight band of muscle- felt like injecting gluteus minimus, based on on location. t will be able to tell if helped with therapy in  1 hr.      LOS: 15 days A FACE TO FACE EVALUATION WAS PERFORMED  Bridgett Hattabaugh 10/25/2020, 8:09 AM

## 2020-10-26 ENCOUNTER — Inpatient Hospital Stay (HOSPITAL_COMMUNITY): Payer: 59 | Admitting: Occupational Therapy

## 2020-10-26 ENCOUNTER — Inpatient Hospital Stay (HOSPITAL_COMMUNITY): Payer: 59

## 2020-10-26 NOTE — Progress Notes (Signed)
Occupational Therapy Session Note  Patient Details  Name: Alexander Rojas MRN: [id number] Date of Birth: 12-06-1955  Today's Date: 10/26/2020 OT Individual Time: 3491-7915 OT Individual Time Calculation (min): 75 min    Short Term Goals: Week 2:  OT Short Term Goal 1 (Week 2): STGs = LTGs d/t ELOS  Skilled Therapeutic Interventions/Progress Updates:    Pt resting in bed upon arrival. Pt donned LSO supine (per pt request). Supine>sit EOB with supervision. Pt declined shower this morning, stating he just received pain meds and "they haven't worked their way down to my stomach yet." Pain per below. Pt amb with RW to sink and stood at sink to brush teeth.  With forward hip flexion of approx 10*, pt reports increased discomfort. Pt returned to bed and sat EOB to doff/don new scub pants. Pt amb in hallway with RW-supervision with pain increasing. Pt amb to ADL apartment and practiced walk in shower transfers with supervision. Pt stated he will take his RW into shower and stand to take shower. Pt states his shower is 8'x10' with built in seat that he can sit on if needed. Recommended pt not shower until assessed by HHOT. Pt provided information on paper scrubs per request. Pt returned to room and remained in bed with all needs within reach-all 4 rails up.  Therapy Documentation Precautions:  Precautions Precautions: Back Precaution Comments: reviewed back precautions Required Braces or Orthoses: Spinal Brace Spinal Brace: Applied in supine position,Lumbar corset Restrictions Weight Bearing Restrictions: No   Pain:  Pain in R hip intially "tolerable" but escalated to 20/10 as session progressed   Therapy/Group: Individual Therapy  Rich Brave 10/26/2020, 12:15 PM

## 2020-10-26 NOTE — Progress Notes (Signed)
Occupational Therapy Discharge Summary  Patient Details  Name: Alexander Rojas MRN: [id number] Date of Birth: 06-21-1956  Today's Date: 10/26/2020 OT Individual Time: 3818-2993 OT Individual Time Calculation (min): 82 min    Patient has met 8 of 8 long term goals due to improved activity tolerance, improved balance, postural control, ability to compensate for deficits and improved coordination.  Patient to discharge at overall Supervision - Mod I level.  Patient's care partner is independent to provide the necessary physical assistance at discharge.  Pt is Mod I with functional mobility with RW for ADL tasks to/from bathroom and in his surroundings, Mod I with toilet transfers and hygiene/clothing management with compensatory techniques and within back precautions, Supervision for shower level bathing and LB dressing with use of LHS and reacher. D/t inconsistent pain levels in R hip, pt will sometimes need more physical assist to thread pants and/or don over hips but has consistently been at Supervision level with rest breaks and pain relieving positions. Supervision with shower transfers using posterior entry method as well. Phone call with wife Alexander Rojas on 1/6 to discuss CLOF and need for occasional assist with high pain levels.   Reasons goals not met: NA  Recommendation:  Patient will benefit from ongoing skilled OT services in home health setting to continue to advance functional skills in the area of BADL, iADL and Reduce care partner burden.  Equipment: none  Recommended pt purchase another bed side commode and/or tall shower seat with handles for bathing, aware of where to purchase. Also recommended suction cup grab bars and hand outs provided. Pt has also purchased hip kit to use at home for AE.  Reasons for discharge: treatment goals met and discharge from hospital  Patient/family agrees with progress made and goals achieved: Yes  OT Discharge Precautions/Restrictions   Precautions Precautions: Back Required Braces or Orthoses: Spinal Brace Spinal Brace: Lumbar corset;Applied in standing position Restrictions Weight Bearing Restrictions: No Vital Signs Therapy Vitals Temp: 98.7 F (37.1 C) Pulse Rate: 85 Resp: 18 BP: 111/72 Patient Position (if appropriate): Lying Oxygen Therapy SpO2: 93 % O2 Device: Room Air Pain Pain Assessment Pain Scale: 0-10 Pain Score: 8  Pain Type: Acute pain Pain Location: Hip Pain Orientation: Right Pain Descriptors / Indicators: Aching;Spasm Pain Onset: With Activity Pain Intervention(s): Repositioned;Rest;RN made aware ADL ADL Equipment Provided: Reacher,Sock aid,Long-handled sponge Eating: Set up Grooming: Setup Upper Body Bathing: Supervision/safety Where Assessed-Upper Body Bathing: Shower Lower Body Bathing: Supervision/safety Where Assessed-Lower Body Bathing: Bed level Upper Body Dressing: Supervision/safety Lower Body Dressing: Supervision/safety Where Assessed-Lower Body Dressing: Bed level Toileting: Modified independent Where Assessed-Toileting: Bedside Commode,Toilet Toilet Transfer: Modified independent Toilet Transfer Method: Counselling psychologist: Geophysical data processor: Close supervision Vision Baseline Vision/History: Wears glasses Wears Glasses: At all times Patient Visual Report: No change from baseline Perception  Perception: Within Functional Limits Praxis Praxis: Intact Praxis-Other Comments: limited by pain at times but overall improved from eval Cognition Overall Cognitive Status: Within Functional Limits for tasks assessed Arousal/Alertness: Awake/alert Orientation Level: Oriented X4 Memory: Appears intact Awareness: Appears intact Problem Solving: Appears intact Safety/Judgment: Appears intact Sensation Sensation Light Touch: Appears Intact Coordination Gross Motor Movements are Fluid and Coordinated: Yes Fine Motor Movements are  Fluid and Coordinated: Yes Motor  Motor Motor: Within Functional Limits Motor - Discharge Observations: stiffness due to pain Mobility  Bed Mobility Bed Mobility: Supine to Sit Rolling Right: Independent Rolling Left: Independent Right Sidelying to Sit: Independent;Maximal Assistance - Patient 25-49% Supine to Sit: Independent  Sit to Supine: Independent Transfers Sit to Stand: Independent with assistive device Stand to Sit: Independent with assistive device  Trunk/Postural Assessment  Cervical Assessment Cervical Assessment: Within Functional Limits Thoracic Assessment Thoracic Assessment: Within Functional Limits Lumbar Assessment Lumbar Assessment: Exceptions to [facility] (stiffness/rigidity d/t pain) Postural Control Postural Control: Within Functional Limits  Balance Balance Balance Assessed: Yes Static Sitting Balance Static Sitting - Level of Assistance: 6: Modified independent (Device/Increase time) Static Sitting - Comment/# of Minutes: prefers UE support in sitting due to pain Dynamic Sitting Balance Dynamic Sitting - Balance Support: Feet supported Dynamic Sitting - Level of Assistance: 7: Independent Dynamic Sitting Balance - Compensations: dynamic sitting during ADL, tends to lean L and offload R hip Dynamic Sitting - Balance Activities: Lateral lean/weight shifting;Forward lean/weight shifting;Reaching for objects Static Standing Balance Static Standing - Balance Support: No upper extremity supported Static Standing - Level of Assistance: 7: Independent Static Standing - Comment/# of Minutes: can stand without UE support Dynamic Standing Balance Dynamic Standing - Balance Support: No upper extremity supported Dynamic Standing - Level of Assistance: 5: Stand by assistance Dynamic Standing - Balance Activities: Reaching across midline Dynamic Standing - Comments: SBA/supervision dynamic standing during LB ADL Extremity/Trunk Assessment RUE Assessment RUE  Assessment: Within Functional Limits LUE Assessment LUE Assessment: Within Functional Limits   Viona Gilmore 10/26/2020, 5:29 PM

## 2020-10-26 NOTE — Progress Notes (Signed)
Physical Therapy Session Note  Patient Details  Name: Alexander Rojas MRN: [id number] Date of Birth: Oct 08, 1956  Today's Date: 10/26/2020 PT Individual Time: 1300-1400 PT Individual Time Calculation (min): 60 min   Short Term Goals: Week 2:  PT Short Term Goal 1 (Week 2): STG=LTG due to ELOS  Skilled Therapeutic Interventions/Progress Updates:    Patient in supine and reports having more pain today.  Performed supine to sit to stand mod I.  Already had brace donned and reminded him to remove when in supine.  Also reminded him to wear brace on outside of clothing.  Patient ambulated x 400' with RW mod I stopped twice in hallway to rest due to pain and reminders about pain relieving positions.  Patient sit to supine on mat and rolled to prone to improve pain and RN in to deliver pain medication.  Patient performed supine SLR and knee flexion.  Returned to sitting mod I.  Patient performed simulated car transfer off edge of mat with S.  Patient negotiated 4 steps with R rail side step technique with S due to pain.  Ambulated to ortho gym to negotiate curb and ramp with RW mod I for ramp and S for curb for safety.  Patient returned to room and sit to supine with proper technique mod I.  Removed brace and discussed pt allowed up in the room unaided with RW and for trips to bathroom unaided, but always okay to call for help if he wishes.  Patient verbalized understanding.   Therapy Documentation Precautions:  Precautions Precautions: Back Precaution Comments: able to state 4/4 back precautions Required Braces or Orthoses: Spinal Brace Spinal Brace: Lumbar corset,Applied in standing position Restrictions Weight Bearing Restrictions: No Pain: Pain Assessment Pain Scale: 0-10 Pain Score: 8  Pain Type: Acute pain Pain Location: Hip Pain Orientation: Right Pain Descriptors / Indicators: Aching;Spasm Pain Frequency: Intermittent Pain Onset: With Activity Pain Intervention(s): Repositioned;Rest;RN  made aware    Therapy/Group: Individual Therapy  [name]  [name], PT 10/26/2020, 5:14 PM

## 2020-10-26 NOTE — Progress Notes (Signed)
Physical Therapy Discharge Summary  Patient Details  Name: Alexander Rojas MRN: [id number] Date of Birth: [birth year]   Patient has met 9 of 10 long term goals due to improved activity tolerance, improved balance, increased strength, decreased pain and ability to compensate for deficits.  Patient to discharge at an ambulatory level Modified Independent.   Patient's care partner is independent to provide the necessary only intermittent assist needed for safety with stairs and car transfers assistance at discharge.  Reasons goals not met: Patient did not practice wheelchair mobility due to pain when sitting. Patient met goal for car transfers as simulated on edge of mat, but was too fearful of pain to practice in car simulator so planned to practice to his personal vehicle at discharge but pt had already discharged prior to appointment to practice.   Recommendation:  Patient will benefit from ongoing skilled PT services in home health setting to continue to advance safe functional mobility, address ongoing impairments in mobility due to pain, and minimize fall risk.  Equipment: rolling walker and hospital bed  Reasons for discharge: treatment goals met and discharge from hospital  Patient/family agrees with progress made and goals achieved: Yes  PT Discharge Precautions/Restrictions Precautions Precautions: Back Precaution Comments: able to state 4/4 back precautions Required Braces or Orthoses: Spinal Brace Spinal Brace: Lumbar corset;Applied in standing position Pain Pain Assessment Pain Scale: 0-10 Pain Score: 0-No pain Pain Type: Acute pain Pain Location: Hip Pain Orientation: Right Pain Descriptors / Indicators: Aching;Spasm Pain Frequency: Intermittent Pain Onset: On-going Pain Intervention(s): Emotional support;Repositioned;RN made aware Vision/Perception     Cognition Overall Cognitive Status: Within Functional Limits for tasks assessed Arousal/Alertness:  Awake/alert Orientation Level: Oriented X4 Attention: Selective Sensation Sensation Light Touch: Appears Intact Motor  Motor Motor: Within Functional Limits Motor - Discharge Observations: stiffness due to pain  Mobility Bed Mobility Bed Mobility: Rolling Right;Rolling Left;Right Sidelying to Sit;Sit to Supine Rolling Right: Independent Rolling Left: Independent Right Sidelying to Sit: Independent;Maximal Assistance - Patient 25-49% Sit to Supine: Independent Transfers Transfers: Sit to Stand;Stand to Sit Sit to Stand: Independent with assistive device Stand to Sit: Independent with assistive device Transfer (Assistive device): Rolling walker Locomotion  Gait Ambulation: Yes Gait Assistance: Independent with assistive device Gait Distance (Feet): 400 Feet Assistive device: Rolling walker Gait Gait: Yes Gait Pattern: Impaired Gait Pattern: Decreased stride length;Trendelenburg Gait velocity: slow pace and pauses with pain Stairs / Additional Locomotion Stairs: Yes Stairs Assistance: Supervision/Verbal cueing Stair Management Technique: One rail Right;Sideways Number of Stairs: 4 Height of Stairs: 6 Ramp: Independent with assistive device Curb: Supervision/Verbal cueing  Trunk/Postural Assessment  Cervical Assessment Cervical Assessment: Within Functional Limits Thoracic Assessment Thoracic Assessment: Within Functional Limits Lumbar Assessment Lumbar Assessment: Exceptions to [facility] (stiff and limited mobility due to pain) Postural Control Postural Control: Within Functional Limits  Balance Balance Balance Assessed: Yes Static Sitting Balance Static Sitting - Level of Assistance: 6: Modified independent (Device/Increase time) Static Sitting - Comment/# of Minutes: prefers UE support in sitting due to pain Dynamic Sitting Balance Dynamic Sitting - Balance Support: Feet supported Dynamic Sitting - Level of Assistance: 7: Independent Dynamic Sitting Balance -  Compensations: 1 UE support when reaching to feet due to pain Static Standing Balance Static Standing - Balance Support: No upper extremity supported Static Standing - Level of Assistance: 7: Independent Static Standing - Comment/# of Minutes: can stand without UE support Dynamic Standing Balance Dynamic Standing - Balance Support: No upper extremity supported Dynamic Standing - Level of Assistance: 7:  Independent Dynamic Standing - Balance Activities: Reaching across midline Dynamic Standing - Comments: reaching to tap targets over large screen no UE support Extremity Assessment      RLE Assessment RLE Assessment: Exceptions to [facility] Active Range of Motion (AROM) Comments: AROM grossly [facility] General Strength Comments: hip flexion 3+/5, knee extension 4/5, anlke DF 4+/5, hip abduction 3+/5 LLE Assessment LLE Assessment: Exceptions to [facility] Active Range of Motion (AROM) Comments: AROM WFL General Strength Comments: hip flexion 4-/5, knee extension 4+/5, ankle DF 5/5, hip abduction [address]  [place], [place] 10/27/2020  10/26/2020, 5:13 PM

## 2020-10-26 NOTE — Progress Notes (Signed)
[facility] PHYSICAL MEDICINE & REHABILITATION PROGRESS NOTE   Subjective/Complaints:  Pt reports is doing better going from sit- stand from EOB- is Supervision to Mod I-  Doesn't feel like trigger point injections helped pain- but is doing better with therapy.   Wants to try trigger point injections again.    ROS:   Pt denies SOB, abd pain, CP, N/V/C/D, and vision changes   Objective:   No results found. No results for input(s): WBC, HGB, HCT, PLT in the last 72 hours. No results for input(s): NA, K, CL, CO2, GLUCOSE, BUN, CREATININE, CALCIUM in the last 72 hours.  Intake/Output Summary (Last 24 hours) at 10/26/2020 1012 Last data filed at 10/26/2020 0851 Gross per 24 hour  Intake 680 ml  Output 1325 ml  Net -645 ml        Physical Exam: Vital Signs Blood pressure 100/69, pulse 79, temperature 98.4 F (36.9 C), resp. rate 18, height 5\' 10"  (1.778 m), weight 80.6 kg, SpO2 96 %. Constitutional: laying on back/supine, in bed- turns to side easier than last week, NAD HENT: Normocephalic.  Atraumatic. Eyes: conjugate gaze Cardiovascular: RRR Respiratory: CTA B/L- no W/R/R- good air movement GI: Soft, NT, ND, (+)BS  Skin: Warm and dry. Thoracic wound looks great- is open, but just superficially- not assessed today Psych: brighter affect today Musc: No edema in extremities.  No tenderness in extremities. Neuro: Alert Motor: still TTP over R gluteus minimus, however appears less than before.  Bilateral upper extremities: >/4/5 throughout Left lower extremity: 4-4+/5 proximal distal Right lower extremity: 3+-4-/5 proximal distal, improving  Assessment/Plan: 1. Functional deficits which require 3+ hours per day of interdisciplinary therapy in a comprehensive inpatient rehab setting. Physiatrist is providing close team supervision and 24 hour management of active medical problems listed below. Physiatrist and rehab team continue to assess barriers to discharge/monitor patient  progress toward functional and medical goals  Care Tool:  Bathing    Body parts bathed by patient: Right arm,Left arm,Chest,Abdomen,Face,Front perineal area,Buttocks,Right upper leg,Left upper leg,Right lower leg,Left lower leg   Body parts bathed by helper: Buttocks,Right lower leg,Left lower leg     Bathing assist Assist Level: Supervision/Verbal cueing (standing with use of grab bars)     Upper Body Dressing/Undressing Upper body dressing   What is the patient wearing?: Pull over shirt,Orthosis Orthosis activity level: Performed by helper  Upper body assist Assist Level: Supervision/Verbal cueing    Lower Body Dressing/Undressing Lower body dressing      What is the patient wearing?: Pants     Lower body assist Assist for lower body dressing: Supervision/Verbal cueing     Toileting Toileting Toileting Activity did not occur (Clothing management and hygiene only): N/A (no void or bm) (did not need to void and in too much pain for toileting at eval)  Toileting assist Assist for toileting: Supervision/Verbal cueing Assistive Device Comment: urinal sitting in chair   Transfers Chair/bed transfer  Transfers assist  Chair/bed transfer activity did not occur: Safety/medical concerns (unable due to pain)  Chair/bed transfer assist level: Supervision/Verbal cueing     Locomotion Ambulation   Ambulation assist   Ambulation activity did not occur: Safety/medical concerns (unable due to pain)  Assist level: Supervision/Verbal cueing Assistive device: Walker-rolling Max distance: 150'   Walk 10 feet activity   Assist     Assist level: Supervision/Verbal cueing Assistive device: Walker-rolling   Walk 50 feet activity   Assist    Assist level: Supervision/Verbal cueing Assistive device: Walker-rolling  Walk 150 feet activity   Assist    Assist level: Supervision/Verbal cueing Assistive device: Walker-rolling    Walk 10 feet on uneven surface   activity   Assist Walk 10 feet on uneven surfaces activity did not occur: Safety/medical concerns (unable due to pain)         Wheelchair     Assist Will patient use wheelchair at discharge?: No Type of Wheelchair: Manual Wheelchair activity did not occur: Safety/medical concerns (unable due to pain)         Wheelchair 50 feet with 2 turns activity    Assist            Wheelchair 150 feet activity     Assist          Medical Problem List and Plan: 1.  Lower extremity weakness right greater than left with numbness and decrease in sensation secondary to failure of anterior and posterior instrumentation L5-S1 fusion from 09/19/2020.  Status post lumbar reexploration and removal of instrumentation L5-S1 repeat decompression both L5 nerve roots with segmental fixation L4 09/27/2020.  Pt demonstrates residual right L5 radiculopathy on exam.             Back brace as directed  Continue CIR  Repeat DG spine showing for post surgical changes  1/3- will see if can extend him due to continued pain/limiting function- maybe to 1/10?  1/5- extended to 1/7.   1/6- d/c tomorrow 2.  Antithrombotics: -DVT/anticoagulation: Age indeterminate left, small and external iliac DVT- Xarelto started x 3 months             -antiplatelet therapy: N/A 3. Pain Management:   Neurontin 400 mg 3 times daily, tylenol prn.   Lyrica 75 mg BID , increased to 100 3 times daily on 12/27  Added Lidoderm patches 2 patches 9pm to 9am  Increased Robaxin to 750mg  TID.  Increased Nucynta to 150 mg QID for pain on 12/29, again on 12/30  Valium 5 mg twice daily started on 12/30  1/3- pain "off the wall" so bad when transfers- will increase Valium to 7,5 mg BID and Lyrica increase to 150 mg TID- cannot increase Nucynta- will try Trigger point injection tomorrow.    1/5- trigger points done yesterday- might have improved pain- will see when gets OOB.  1/6- doesn't feel like trP were helpful, but is  progressing with therapy-   4. Mood: Provide emotional support             -antipsychotic agents: N/A 5. Neuropsych: This patient is capable of making decisions on his own behalf. 6. Skin/Wound Care: Routine skin checks 7. Fluids/Electrolytes/Nutrition: Routine in and outs 8.  Hypertension.  HCTZ 12.5 mg daily DC'd on 12/28 due to soft blood pressures  Cozaar DC'd on 12/28  1/4- BP controlled- con't regimen- is a little soft, but asymptomatic 1/5- BP a little low- 98/50s- not on any BP meds anymore.    Monitor with increased mobility   Vitals:   10/25/20 2025 10/26/20 0439  BP: 98/73 100/69  Pulse: 72 79  Resp: 18 18  Temp: 98.2 F (36.8 C) 98.4 F (36.9 C)  SpO2: 96% 96%   9.  Hypothyroidism.  Synthroid 10.  Drug-induced constipation.  Colace twice daily  Movantik 12.5 mg daily, increase to 25, 12/27  MiraLAX daily as needed  1/3- going with enema- cannot go in bedpan otherwise and cannot tolerate being on BSC  1/5- had BM on toilet again yesterday 11.  BPH  with UTI  UA+, keflex changed to Bactrim DS for 5 days BID due to sensitivities  12/3- done with Bactrim 12.  Leukocytosis  WBC 10.6 on 12/22, labs ordered for tomorrow  See #11  Afebrile 13. Dispo  1/3- will see tomorrow if can extend til 1/10  Of note, prednisone taper after surgery didn't help pain.   1/5- will extend to 1/7  Did more trigger point injections on R piriformis, R gluteus minimus and medias- he appears to have multiple twitch responses, and appeared to have better pain control afterwards than Tuesday-   I spent a total of 40 minutes on care today- >50% coordination of care.        LOS: 16 days A FACE TO FACE EVALUATION WAS PERFORMED  Denora Wysocki 10/26/2020, 10:12 AM

## 2020-10-27 ENCOUNTER — Inpatient Hospital Stay (HOSPITAL_COMMUNITY): Payer: 59

## 2020-10-27 NOTE — Progress Notes (Signed)
Inpatient Rehabilitation Care Coordinator Discharge Note  The overall goal for the admission was met for:   Discharge location: Yes, home   Length of Stay: Yes, 17 Days  Discharge activity level: Yes, MOD I to Supervision  Home/community participation: Yes  Services provided included: MD, RD, PT, OT, SLP, RN, CM, TR, Pharmacy, Neuropsych and SW  Financial Services: Other: [facility]  Choices offered to/list presented to: Patient   Follow-up services arranged: Home Health: [facility] (Pt/Ot/Sph) 5x a week  Comments (or additional information): PT OT   Patient/Family verbalized understanding of follow-up arrangements: Yes  Individual responsible for coordination of the follow-up plan: patient/spouse [phone]  Confirmed correct DME delivered: Dyanne Iha 10/27/2020    Dyanne Iha

## 2020-10-27 NOTE — Progress Notes (Signed)
Patient d/c with daughter to home. Patient stated that he received all medication and understood all d/c instructions given. Sanda Linger, LPN

## 2020-10-27 NOTE — Plan of Care (Signed)
  Problem: Consults Goal: RH SPINAL CORD INJURY PATIENT EDUCATION Description:  See Patient Education module for education specifics.  Outcome: Completed/Met   Problem: SCI BOWEL ELIMINATION Goal: RH STG MANAGE BOWEL WITH ASSISTANCE Description: STG Manage Bowel with mod I Assistance. Outcome: Completed/Met   Problem: SCI BLADDER ELIMINATION Goal: RH STG MANAGE BLADDER WITH ASSISTANCE Description: STG Manage Bladder With mod I Assistance Outcome: Completed/Met   Problem: RH SKIN INTEGRITY Goal: RH STG MAINTAIN SKIN INTEGRITY WITH ASSISTANCE Description: STG Maintain Skin Integrity With mod I Assistance. Outcome: Completed/Met   Problem: RH PAIN MANAGEMENT Goal: RH STG PAIN MANAGED AT OR BELOW PT'S PAIN GOAL Description: Pain level less than 4 on scale of 0-10 Outcome: Completed/Met   Problem: RH KNOWLEDGE DEFICIT SCI Goal: RH STG INCREASE KNOWLEDGE OF SELF CARE AFTER SCI Description: Pt will be able to demonstrate understanding of back precautions related to recent surgery to prevent further complications independently. Pt will be able to demonstrate understanding of medication regimen as well as nonpharmacologic method to better manage pain independently.   Outcome: Completed/Met

## 2020-10-27 NOTE — Plan of Care (Signed)
  Problem: RH Balance Goal: LTG Patient will maintain dynamic standing balance (PT) Description: LTG:  Patient will maintain dynamic standing balance with assistance during mobility activities (PT) Outcome: Completed/Met   Problem: Sit to Stand Goal: LTG:  Patient will perform sit to stand with assistance level (PT) Description: LTG:  Patient will perform sit to stand with assistance level (PT) Outcome: Completed/Met   Problem: RH Bed Mobility Goal: LTG Patient will perform bed mobility with assist (PT) Description: LTG: Patient will perform bed mobility with assistance, with/without cues (PT). Outcome: Completed/Met   Problem: RH Bed to Chair Transfers Goal: LTG Patient will perform bed/chair transfers w/assist (PT) Description: LTG: Patient will perform bed to chair transfers with assistance (PT). Outcome: Completed/Met   Problem: RH Car Transfers Goal: LTG Patient will perform car transfers with assist (PT) Description: LTG: Patient will perform car transfers with assistance (PT). Outcome: Completed/Met   Problem: RH Furniture Transfers Goal: LTG Patient will perform furniture transfers w/assist (OT/PT) Description: LTG: Patient will perform furniture transfers  with assistance (OT/PT). Outcome: Completed/Met   Problem: RH Ambulation Goal: LTG Patient will ambulate in controlled environment (PT) Description: LTG: Patient will ambulate in a controlled environment, # of feet with assistance (PT). Outcome: Completed/Met Goal: LTG Patient will ambulate in home environment (PT) Description: LTG: Patient will ambulate in home environment, # of feet with assistance (PT). Outcome: Completed/Met   Problem: RH Wheelchair Mobility Goal: LTG Patient will propel w/c in controlled environment (PT) Description: LTG: Patient will propel wheelchair in controlled environment, # of feet with assist (PT) Outcome: Not Met (add Reason) Note: Patient did not practice wheelchair mobility due to  pain when sitting and preferred to walk.    Problem: RH Stairs Goal: LTG Patient will ambulate up and down stairs w/assist (PT) Description: LTG: Patient will ambulate up and down # of stairs with assistance (PT) Outcome: Completed/Met  Magda Kiel, PT

## 2020-10-27 NOTE — Progress Notes (Signed)
[facility] PHYSICAL MEDICINE & REHABILITATION PROGRESS NOTE   Subjective/Complaints:  Pt reports pain maybe a little better after injections- better response than Tuesday.   Wife not coming until 3pm today- cannot get off work until then.   Is a little scared about d/c and level of function, but knows "he will be ok".  Is grateful for the care he received from staff, nursing and physician.   ROS:  Pt denies SOB, abd pain, CP, N/V/C/D, and vision changes   Objective:   DG Lumbar Spine 2-3 Views  Result Date: 10/26/2020 CLINICAL DATA:  Post lumbar fusion EXAM: LUMBAR SPINE - 2-3 VIEW COMPARISON:  10/17/2020 FINDINGS: Exam labeled with 5 lumbar vertebra. Prior posterior fusion of L4-S1 with additional screws traversing the SI joints and extending into the iliac bones bilaterally. Disc prosthesis at L5-S1, with again identified L5-S1 grade 2 spondylolisthesis. Osseous demineralization. LEFT L5 pedicle screw is at the superior margin of L5. No fracture or additional subluxation. Minimal dextroconvex lumbar scoliosis. Scattered endplate spur formation lower thoracic spine. IMPRESSION: Stable postoperative changes as above. Electronically Signed   By: Lavonia Dana M.D.   On: 10/26/2020 11:59   No results for input(s): WBC, HGB, HCT, PLT in the last 72 hours. No results for input(s): NA, K, CL, CO2, GLUCOSE, BUN, CREATININE, CALCIUM in the last 72 hours.  Intake/Output Summary (Last 24 hours) at 10/27/2020 0856 Last data filed at 10/27/2020 0610 Gross per 24 hour  Intake 480 ml  Output 650 ml  Net -170 ml        Physical Exam: Vital Signs Blood pressure 119/81, pulse 76, temperature 99.1 F (37.3 C), temperature source Oral, resp. rate 18, height 5\' 10"  (1.778 m), weight 80.6 kg, SpO2 96 %. Constitutional: laying supine in bed- appropriate, NAD HENT: Normocephalic.  Atraumatic. Eyes: conjugate gaze Cardiovascular: RRR Respiratory: CTA B/L- no W/R/R- good air movement GI: Soft, NT, ND,  (+)BS   Skin: Warm and dry. Thoracic wound looks great- is open, but just superficially-not assessed today Psych: nervous affect about d/c Musc: No edema in extremities.  No tenderness in extremities. Neuro: Alert Motor: still TTP over R gluteus minimus, however appears less than before.  Bilateral upper extremities: >/4/5 throughout Left lower extremity: 4-4+/5 proximal distal Right lower extremity: 3+-4-/5 proximal distal, improving  Assessment/Plan: 1. Functional deficits which require 3+ hours per day of interdisciplinary therapy in a comprehensive inpatient rehab setting.  Physiatrist is providing close team supervision and 24 hour management of active medical problems listed below.  Physiatrist and rehab team continue to assess barriers to discharge/monitor patient progress toward functional and medical goals  Care Tool:  Bathing    Body parts bathed by patient: Right arm,Left arm,Chest,Abdomen,Face,Front perineal area,Buttocks,Right upper leg,Left upper leg,Right lower leg,Left lower leg   Body parts bathed by helper: Buttocks,Right lower leg,Left lower leg     Bathing assist Assist Level: Supervision/Verbal cueing     Upper Body Dressing/Undressing Upper body dressing   What is the patient wearing?: Pull over shirt,Orthosis Orthosis activity level: Performed by helper  Upper body assist Assist Level: Supervision/Verbal cueing    Lower Body Dressing/Undressing Lower body dressing      What is the patient wearing?: Pants     Lower body assist Assist for lower body dressing: Supervision/Verbal cueing     Toileting Toileting Toileting Activity did not occur (Clothing management and hygiene only): N/A (no void or bm) (did not need to void and in too much pain for toileting at  eval)  Toileting assist Assist for toileting: Independent with assistive device Assistive Device Comment: urinal sitting in chair   Transfers Chair/bed transfer  Transfers assist   Chair/bed transfer activity did not occur: Safety/medical concerns (unable due to pain)  Chair/bed transfer assist level: Independent with assistive device (Simultaneous filing. User may not have seen previous data.)     Locomotion Ambulation   Ambulation assist   Ambulation activity did not occur: Safety/medical concerns (unable due to pain)  Assist level: Independent with assistive device Assistive device: Walker-rolling Max distance: 400'   Walk 10 feet activity   Assist     Assist level: Independent with assistive device Assistive device: Walker-rolling   Walk 50 feet activity   Assist    Assist level: Independent with assistive device Assistive device: Walker-rolling    Walk 150 feet activity   Assist    Assist level: Independent with assistive device Assistive device: Walker-rolling    Walk 10 feet on uneven surface  activity   Assist Walk 10 feet on uneven surfaces activity did not occur: Safety/medical concerns (unable due to pain)   Assist level: Independent with assistive device Assistive device: Aeronautical engineer Will patient use wheelchair at discharge?: No Type of Wheelchair: Manual Wheelchair activity did not occur: Safety/medical concerns (unable due to pain)         Wheelchair 50 feet with 2 turns activity    Assist            Wheelchair 150 feet activity     Assist          Medical Problem List and Plan: 1.  Lower extremity weakness right greater than left with numbness and decrease in sensation secondary to failure of anterior and posterior instrumentation L5-S1 fusion from 09/19/2020.  Status post lumbar reexploration and removal of instrumentation L5-S1 repeat decompression both L5 nerve roots with segmental fixation L4 09/27/2020.  Pt demonstrates residual right L5 radiculopathy on exam.             Back brace as directed  Continue CIR  Repeat DG spine showing for post surgical  changes  1/3- will see if can extend him due to continued pain/limiting function- maybe to 1/10?  1/5- extended to 1/7.   1/6- d/c tomorrow 2.  Antithrombotics: -DVT/anticoagulation: Age indeterminate left, small and external iliac DVT- Xarelto started x 3 months             -antiplatelet therapy: N/A 3. Pain Management:   Neurontin 400 mg 3 times daily, tylenol prn.   Lyrica 75 mg BID , increased to 100 3 times daily on 12/27  Added Lidoderm patches 2 patches 9pm to 9am  Increased Robaxin to 750mg  TID.  Increased Nucynta to 150 mg QID for pain on 12/29, again on 12/30  Valium 5 mg twice daily started on 12/30  1/3- pain "off the wall" so bad when transfers- will increase Valium to 7,5 mg BID and Lyrica increase to 150 mg TID- cannot increase Nucynta- will try Trigger point injection tomorrow.    1/5- trigger points done yesterday- might have improved pain- will see when gets OOB.  1/6- doesn't feel like trP were helpful, but is progressing with therapy-   1/7- thinks trigger point injections on 1/6 might have been helpful- more than Tuesday.   4. Mood: Provide emotional support             -antipsychotic agents: N/A 5. Neuropsych: This patient is capable  of making decisions on his own behalf. 6. Skin/Wound Care: Routine skin checks 7. Fluids/Electrolytes/Nutrition: Routine in and outs 8.  Hypertension.  HCTZ 12.5 mg daily DC'd on 12/28 due to soft blood pressures  Cozaar DC'd on 12/28  1/4- BP controlled- con't regimen- is a little soft, but asymptomatic 1/5- BP a little low- 98/50s- not on any BP meds anymore.   1/7- BP 110s/70s- off BP meds- con't regimen   Monitor with increased mobility   Vitals:   10/26/20 1934 10/27/20 0607  BP: 115/77 119/81  Pulse: 75 76  Resp: 18 18  Temp: (!) 97.4 F (36.3 C) 99.1 F (37.3 C)  SpO2: 96% 96%   9.  Hypothyroidism.  Synthroid 10.  Drug-induced constipation.  Colace twice daily  Movantik 12.5 mg daily, increase to 25, 12/27  MiraLAX  daily as needed  1/3- going with enema- cannot go in bedpan otherwise and cannot tolerate being on BSC  1/5- had BM on toilet again yesterday 11.  BPH with UTI  UA+, keflex changed to Bactrim DS for 5 days BID due to sensitivities  12/3- done with Bactrim 12.  Leukocytosis  WBC 10.6 on 12/22, labs ordered for tomorrow  See #11  Afebrile 13. Dispo  1/3- will see tomorrow if can extend til 1/10  Of note, prednisone taper after surgery didn't help pain.   1/5- will extend to 1/7  1/7- d/c today- wife picking up at 3pm  Did more trigger point injections on R piriformis, R gluteus minimus and medias- he appears to have multiple twitch responses, and appeared to have better pain control afterwards than Tuesday-          LOS: 17 days A FACE TO FACE EVALUATION WAS PERFORMED  Alexander Rojas 10/27/2020, 8:56 AM

## 2020-10-28 DIAGNOSIS — I10 Essential (primary) hypertension: Secondary | ICD-10-CM | POA: Diagnosis not present

## 2020-10-28 DIAGNOSIS — I82422 Acute embolism and thrombosis of left iliac vein: Secondary | ICD-10-CM | POA: Diagnosis not present

## 2020-10-28 DIAGNOSIS — T84296D Other mechanical complication of internal fixation device of vertebrae, subsequent encounter: Secondary | ICD-10-CM | POA: Diagnosis not present

## 2020-10-28 DIAGNOSIS — I82412 Acute embolism and thrombosis of left femoral vein: Secondary | ICD-10-CM | POA: Diagnosis not present

## 2020-10-28 DIAGNOSIS — N400 Enlarged prostate without lower urinary tract symptoms: Secondary | ICD-10-CM | POA: Diagnosis not present

## 2020-10-28 DIAGNOSIS — B9689 Other specified bacterial agents as the cause of diseases classified elsewhere: Secondary | ICD-10-CM | POA: Diagnosis not present

## 2020-10-28 DIAGNOSIS — M5416 Radiculopathy, lumbar region: Secondary | ICD-10-CM | POA: Diagnosis not present

## 2020-10-28 DIAGNOSIS — N390 Urinary tract infection, site not specified: Secondary | ICD-10-CM | POA: Diagnosis not present

## 2020-10-28 DIAGNOSIS — E039 Hypothyroidism, unspecified: Secondary | ICD-10-CM | POA: Diagnosis not present

## 2020-10-30 ENCOUNTER — Telehealth: Payer: Self-pay | Admitting: *Deleted

## 2020-10-30 ENCOUNTER — Other Ambulatory Visit: Payer: Self-pay | Admitting: *Deleted

## 2020-10-30 ENCOUNTER — Telehealth: Payer: Self-pay

## 2020-10-30 ENCOUNTER — Other Ambulatory Visit: Payer: Self-pay | Admitting: Physical Medicine and Rehabilitation

## 2020-10-30 ENCOUNTER — Encounter: Payer: Self-pay | Admitting: *Deleted

## 2020-10-30 MED ORDER — TAPENTADOL HCL 75 MG PO TABS: 150.0000 mg | ORAL_TABLET | Freq: Three times a day (TID) | ORAL | 0 refills | Status: DC

## 2020-10-30 NOTE — Telephone Encounter (Signed)
Alexander Rojas called and requested a refill on his Nucynta. He is out and needs refill sent to [facility].  He has been taking 150 mg 4x day.  Prescription says 4x day but discharge instructions says tid.  Nucynta 75 mg was filled by PMP #30 on 10/25/20. His rx says take 2 q 4 hours. His appt is 11/10/20 with Zella Ball. He did not mention the tramadol.

## 2020-10-30 NOTE — Patient Outreach (Signed)
[facility]) Care Management  [place]  10/30/2020   DAYNA GEURTS 04/04/1956 [id number]  Transition of care call  Referral received:10/30/20 Initial outreach:10/30/20 Insurance: [facility]    Subjective: Initial successful telephone call to patient's preferred number in order to complete transition of care assessment; 2 HIPAA identifiers verified. Explained purpose of call and completed transition of care assessment.  Daeshaun reports doing better,he discussed pain adequately controlled . He discussed contacting Physical medicine and rehab office regarding needing refill on pain medication by tomorrow, she states that he has not heard back yet, discussed with patient,noted contact in record. He reports incision area healing well. He reports tolerating mobility well, up to 200 ft on today, wearing his brace when up. He reports tolerating diet well, reports ongoing problem with constipation, having bowel movement and taking prescribed medications to treat,  denies bladder problems.  Spouse and patient mother  are assisting with his  recovery.  Patient discussed [facility] for [facility], he questions when he will receive his initial home health PT/OT visit . States he contacted representative that visited him and was  told PT/OT make their own schedule and will call him the evening before home visit. Patient discussed not wanting to received HHPT and OT on the same day. I placed call to [facility] spoke with representative Naida Sleight she states  that patient will have [facility] OT visit on Wednesday and he will receive a call the evening before reinforced this with patient. Discussed with representative that patient prefers not to have PT and OT visits on the same day if possible.   Reviewed accessing the following [place] Benefits : He does not have the hospital indemnity He  uses a [facility] outpatient pharmacy at [employer].      Objective:   Mr. 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  He was discharged to [facility] inpatient rehab, 10/10/20- 10/27/20.  PMHX: Anterior Lumbar interbody fusion lumbar 5 - Sacral one, hypertension. He was discharged to home on 10/27/20 with  home health services of physical and occupational services by [facility]   Assessment:  Patient voices good understanding of all discharge instructions.  See transition of care flowsheet for assessment details.   Plan:  Reviewed hospital discharge diagnosis of Lumbar radiculopathy,Lumbar reexploration with extension of posterior instrumentation L4  To the ilium posterior fusion L4 to sacrum      and discharge treatment plan using hospital discharge instructions, assessing medication adherence, reviewing problems requiring provider notification, and discussing the importance of follow up with surgeon, primary care provider and/or specialists as directed.  Reviewed [place] healthy lifestyle program information to receive discounted premium for  2023   Step 1: Get  your annual physical  Step 2: Complete your health assessment  Step 3:Identify your current health status and complete the corresponding action step between October 21, 2020 and June 21, 2021.     Patient agreeable to  call in the next 4 days to follow up regarding home health therapy visit being initiated.   Joylene Draft, RN, BSN  [place] Management Coordinator  [phone]- Mobile [phone]- Toll Free Main Office

## 2020-10-30 NOTE — Telephone Encounter (Signed)
OK- I called to let pt know- we will see if he needs that much near end of month

## 2020-10-30 NOTE — Telephone Encounter (Signed)
Sent in Rx for Nucynta- and told him to use Lidoderm patch for dorsum of foot that's very painful/burning- ML- they are OTC now

## 2020-10-30 NOTE — Telephone Encounter (Signed)
Pharmacy called to inform Dr Dagoberto Ligas that Insurance will only pay for max #180 allowed per month.  They are filling the 180 and it will void out the amount #240.

## 2020-10-30 NOTE — Telephone Encounter (Signed)
Transitional Care call--Patient   1. Are you/is patient experiencing any problems since coming home? No Are there any questions regarding any aspect of care? No 2. Are there any questions regarding medications administration/dosing? Need Refill Nucynta Are meds being taken as prescribed? Yes Patient should review meds with caller to confirm 3. Have there been any falls? No 4. Has Home Health been to the house and/or have they contacted you? Yes If not, have you tried to contact them? Can we help you contact them? 5. Are bowels and bladder emptying properly? Yes Are there any unexpected incontinence issues? No If applicable, is patient following bowel/bladder programs? 6. Any fevers, problems with breathing, unexpected pain? No 7. Are there any skin problems or new areas of breakdown? No 8. Has the patient/family member arranged specialty MD follow up (ie cardiology/neurology/renal/surgical/etc)? Yes Can we help arrange? 9. Does the patient need any other services or support that we can help arrange? No 10. Are caregivers following through as expected in assisting the patient? Yes 11. Has the patient quit smoking, drinking alcohol, or using drugs as recommended? Yes  Appointment time 2:40 pm, arrive time 2:20 pm and who it is with here [address] [employer] [phone]

## 2020-10-30 NOTE — Patient Outreach (Signed)
[facility]) Care Management  10/30/2020  Alexander Rojas [birth year] [id number]   Transition of care call   Referral received:10/30/20 Initial outreach:10/30/20 Insurance: [employer]    Initial unsuccessful telephone call to patient's preferred number in order to complete transition of care assessment; no answer, left HIPAA compliant voicemail message requesting return call.   Objective: Per electronic record, Alexander Rojas  was hospitalized at 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  He was discharged to [facility] inpatient rehab, 10/10/20- 10/27/20.  PMHX: Anterior Lumbar interbody fusion lumbar 5 - Sacral one, hypertension. He was discharged to home on 10/27/20 with  home health services of physical and occupational services by [facility]  Plan: This RNCM will route unsuccessful outreach letter with [place] Management pamphlet and 24 hour Nurse Advice Line Magnet to [place] Management clinical pool to be mailed to patiens home address. This RNCM will attempt another outreach within 4 business days.   Joylene Draft, RN, BSN  [place] Management Coordinator  [phone]- Mobile [phone]- Toll Free Main Office

## 2020-10-31 ENCOUNTER — Telehealth: Payer: Self-pay

## 2020-10-31 DIAGNOSIS — I82412 Acute embolism and thrombosis of left femoral vein: Secondary | ICD-10-CM | POA: Diagnosis not present

## 2020-10-31 DIAGNOSIS — M5416 Radiculopathy, lumbar region: Secondary | ICD-10-CM | POA: Diagnosis not present

## 2020-10-31 DIAGNOSIS — N400 Enlarged prostate without lower urinary tract symptoms: Secondary | ICD-10-CM | POA: Diagnosis not present

## 2020-10-31 DIAGNOSIS — N390 Urinary tract infection, site not specified: Secondary | ICD-10-CM | POA: Diagnosis not present

## 2020-10-31 DIAGNOSIS — B9689 Other specified bacterial agents as the cause of diseases classified elsewhere: Secondary | ICD-10-CM | POA: Diagnosis not present

## 2020-10-31 DIAGNOSIS — E039 Hypothyroidism, unspecified: Secondary | ICD-10-CM | POA: Diagnosis not present

## 2020-10-31 DIAGNOSIS — I82422 Acute embolism and thrombosis of left iliac vein: Secondary | ICD-10-CM | POA: Diagnosis not present

## 2020-10-31 DIAGNOSIS — I10 Essential (primary) hypertension: Secondary | ICD-10-CM | POA: Diagnosis not present

## 2020-10-31 DIAGNOSIS — T84296D Other mechanical complication of internal fixation device of vertebrae, subsequent encounter: Secondary | ICD-10-CM | POA: Diagnosis not present

## 2020-10-31 NOTE — Telephone Encounter (Signed)
Alexander Rojas,PT from [facility] called requesting verbal orders for HHPT 1wk1, 2wk3. Orders approved and given.

## 2020-11-01 ENCOUNTER — Telehealth: Payer: Self-pay

## 2020-11-01 ENCOUNTER — Other Ambulatory Visit: Payer: Self-pay | Admitting: Neurological Surgery

## 2020-11-01 ENCOUNTER — Other Ambulatory Visit (HOSPITAL_COMMUNITY): Payer: Self-pay | Admitting: Neurological Surgery

## 2020-11-01 ENCOUNTER — Other Ambulatory Visit: Payer: Self-pay | Admitting: *Deleted

## 2020-11-01 DIAGNOSIS — E039 Hypothyroidism, unspecified: Secondary | ICD-10-CM | POA: Diagnosis not present

## 2020-11-01 DIAGNOSIS — M5417 Radiculopathy, lumbosacral region: Secondary | ICD-10-CM

## 2020-11-01 DIAGNOSIS — M5416 Radiculopathy, lumbar region: Secondary | ICD-10-CM | POA: Diagnosis not present

## 2020-11-01 DIAGNOSIS — I10 Essential (primary) hypertension: Secondary | ICD-10-CM | POA: Diagnosis not present

## 2020-11-01 DIAGNOSIS — I82412 Acute embolism and thrombosis of left femoral vein: Secondary | ICD-10-CM | POA: Diagnosis not present

## 2020-11-01 DIAGNOSIS — B9689 Other specified bacterial agents as the cause of diseases classified elsewhere: Secondary | ICD-10-CM | POA: Diagnosis not present

## 2020-11-01 DIAGNOSIS — I82422 Acute embolism and thrombosis of left iliac vein: Secondary | ICD-10-CM | POA: Diagnosis not present

## 2020-11-01 DIAGNOSIS — T84296D Other mechanical complication of internal fixation device of vertebrae, subsequent encounter: Secondary | ICD-10-CM | POA: Diagnosis not present

## 2020-11-01 DIAGNOSIS — N400 Enlarged prostate without lower urinary tract symptoms: Secondary | ICD-10-CM | POA: Diagnosis not present

## 2020-11-01 DIAGNOSIS — N390 Urinary tract infection, site not specified: Secondary | ICD-10-CM | POA: Diagnosis not present

## 2020-11-01 NOTE — Telephone Encounter (Signed)
Erlene Quan, OT from [facility] called requesting verbal orders for [facility] 2wk4. Orders approved and given.

## 2020-11-01 NOTE — Patient Outreach (Signed)
[place] [facility]) Care Management  11/01/2020  Alexander Rojas June 15, 1956 [id number]   Transition of care follow up call/  Referral received:10/30/20 Initial outreach:10/30/20 Insurance: [facility]    Subjective: Unsuccessful outreach call to patient , no answer able to leave a HIPAA compliant voice mail message for return call.    Objective: 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 He was discharged to [facility] inpatient rehab, 10/10/20- 10/27/20. PMHX: Anterior Lumbar interbody fusion lumbar 5 - Sacral one, hypertension. He was discharged to home on1/7/22with home health services of physical and occupational services by [facility].    Plan Will plan return call in the next 4 business day if no return call from patient sooner.    Joylene Draft, RN, BSN  [place] Management Coordinator  [phone]- Mobile [phone]- Toll Free Main Office

## 2020-11-02 ENCOUNTER — Other Ambulatory Visit: Payer: Self-pay | Admitting: *Deleted

## 2020-11-02 ENCOUNTER — Other Ambulatory Visit: Payer: Self-pay | Admitting: Physician Assistant

## 2020-11-02 DIAGNOSIS — T84296D Other mechanical complication of internal fixation device of vertebrae, subsequent encounter: Secondary | ICD-10-CM | POA: Diagnosis not present

## 2020-11-02 DIAGNOSIS — I10 Essential (primary) hypertension: Secondary | ICD-10-CM | POA: Diagnosis not present

## 2020-11-02 DIAGNOSIS — I82422 Acute embolism and thrombosis of left iliac vein: Secondary | ICD-10-CM | POA: Diagnosis not present

## 2020-11-02 DIAGNOSIS — B9689 Other specified bacterial agents as the cause of diseases classified elsewhere: Secondary | ICD-10-CM | POA: Diagnosis not present

## 2020-11-02 DIAGNOSIS — E039 Hypothyroidism, unspecified: Secondary | ICD-10-CM | POA: Diagnosis not present

## 2020-11-02 DIAGNOSIS — N400 Enlarged prostate without lower urinary tract symptoms: Secondary | ICD-10-CM | POA: Diagnosis not present

## 2020-11-02 DIAGNOSIS — I82412 Acute embolism and thrombosis of left femoral vein: Secondary | ICD-10-CM | POA: Diagnosis not present

## 2020-11-02 DIAGNOSIS — N390 Urinary tract infection, site not specified: Secondary | ICD-10-CM | POA: Diagnosis not present

## 2020-11-02 DIAGNOSIS — M5416 Radiculopathy, lumbar region: Secondary | ICD-10-CM | POA: Diagnosis not present

## 2020-11-02 MED ORDER — NALOXEGOL OXALATE 25 MG PO TABS: 25.0000 mg | ORAL_TABLET | Freq: Every day | ORAL | 0 refills | Status: DC

## 2020-11-02 NOTE — Patient Outreach (Signed)
[facility]) Care Management  11/02/2020  AXCEL HORSCH 1956-06-06 [id number]  Case Conference  Monthly telephonic UMR case management review with UMR RNCMs and Dr Alonza Bogus. Update provided to include: Transition of care call assessment , reviewed patient current progress, reviewed post discharge home health services in place for physical and occupational therapy. Follow up appointments in place with surgeon and rehab MD.  Unsuccessful follow up call.   Plan Scheduled follow up call in the next week to assess for further care coordination needs.    Joylene Draft, RN, BSN  [place] Management Coordinator  [phone]- Mobile [phone]- Toll Free Main Office

## 2020-11-04 ENCOUNTER — Other Ambulatory Visit: Payer: Self-pay

## 2020-11-04 ENCOUNTER — Ambulatory Visit (HOSPITAL_COMMUNITY)
Admission: RE | Admit: 2020-11-04 | Discharge: 2020-11-04 | Disposition: A | Discharge status: Home / Self Care | Payer: 59 | Source: Ambulatory Visit | Attending: Neurological Surgery | Admitting: Neurological Surgery

## 2020-11-04 DIAGNOSIS — M5126 Other intervertebral disc displacement, lumbar region: Secondary | ICD-10-CM | POA: Diagnosis not present

## 2020-11-04 DIAGNOSIS — M5417 Radiculopathy, lumbosacral region: Secondary | ICD-10-CM | POA: Insufficient documentation

## 2020-11-04 DIAGNOSIS — M48061 Spinal stenosis, lumbar region without neurogenic claudication: Secondary | ICD-10-CM | POA: Diagnosis not present

## 2020-11-04 DIAGNOSIS — M4319 Spondylolisthesis, multiple sites in spine: Secondary | ICD-10-CM | POA: Diagnosis not present

## 2020-11-04 DIAGNOSIS — M4807 Spinal stenosis, lumbosacral region: Secondary | ICD-10-CM | POA: Diagnosis not present

## 2020-11-06 ENCOUNTER — Other Ambulatory Visit: Payer: Self-pay | Admitting: *Deleted

## 2020-11-06 NOTE — Patient Outreach (Signed)
[place] [facility]) Care Management  11/06/2020  LAWRNCE REYEZ Mar 06, 1956 [id number]   Transition of care follow up call/  Referral received:10/30/20 Initial outreach:10/30/20 Insurance: [employer]  Outreach #3 Subjective: Unsuccessful outreach call to patient, no answer able to leave a HIPAA compliant voicemail message for return call.   Objective: 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 He was discharged to [facility] inpatient rehab, 10/10/20- 10/27/20. PMHX: Anterior Lumbar interbody fusion lumbar 5 - Sacral one, hypertension. He was discharged to home on1/7/22with home health services of physical and occupational services by [facility].  Plan In no return call will plan outreach call in the next 3 weeks.    Joylene Draft, RN, BSN  [place] Management Coordinator  [phone]- Mobile [phone]- Toll Free Main Office

## 2020-11-07 ENCOUNTER — Other Ambulatory Visit: Payer: Self-pay | Admitting: *Deleted

## 2020-11-07 DIAGNOSIS — M5416 Radiculopathy, lumbar region: Secondary | ICD-10-CM | POA: Diagnosis not present

## 2020-11-07 DIAGNOSIS — I82412 Acute embolism and thrombosis of left femoral vein: Secondary | ICD-10-CM | POA: Diagnosis not present

## 2020-11-07 DIAGNOSIS — I82422 Acute embolism and thrombosis of left iliac vein: Secondary | ICD-10-CM | POA: Diagnosis not present

## 2020-11-07 DIAGNOSIS — N400 Enlarged prostate without lower urinary tract symptoms: Secondary | ICD-10-CM | POA: Diagnosis not present

## 2020-11-07 DIAGNOSIS — E039 Hypothyroidism, unspecified: Secondary | ICD-10-CM | POA: Diagnosis not present

## 2020-11-07 DIAGNOSIS — I10 Essential (primary) hypertension: Secondary | ICD-10-CM | POA: Diagnosis not present

## 2020-11-07 DIAGNOSIS — T84296D Other mechanical complication of internal fixation device of vertebrae, subsequent encounter: Secondary | ICD-10-CM | POA: Diagnosis not present

## 2020-11-07 DIAGNOSIS — N390 Urinary tract infection, site not specified: Secondary | ICD-10-CM | POA: Diagnosis not present

## 2020-11-07 DIAGNOSIS — B9689 Other specified bacterial agents as the cause of diseases classified elsewhere: Secondary | ICD-10-CM | POA: Diagnosis not present

## 2020-11-07 NOTE — Patient Outreach (Signed)
[place] [facility]) Care Management  11/07/2020  Alexander Rojas 1955/12/16 [id number]   Transition of care follow up call/  Referral received:10/30/20 Initial outreach:10/30/20 [facility]   Subjective Incoming return call from patient,outreach call to patient on 11/06/20.  Discussed with patient follow up call to verify that he had home health therapy services in place, he reports being followed by [facility] physical and occupational services.   Patient voiced concern regarding cost of refill on his Nucynta reports wife paid 1,700 for refill on 30 day supply. Patient request further understanding of this, unable to state his current insurance plan, he agreed that I may contact pharmacy and return call to him.  Spoke with representative a [place] she reports patient paid 1,728, for 30 day supply of medication, she suggest that he may be on a high deductible insurance plan.and for patient to contact Insurance/ [employer]  Contact number on back of insurance card.   Patient with question regarding his benefits, agreeable to contact number for [place] benefits line to answer questions.   Placed return call to patient no answer, able to leave a HIPAA compliant message for return call.   Plan Will plan return call in the next 2 business days to provide benefits contact number as well as Review where to find contact number for insurance. Plan.   Joylene Draft, RN, BSN  [place] Management Coordinator  [phone]- Mobile [phone]- Toll Free Main Office

## 2020-11-08 DIAGNOSIS — Z7901 Long term (current) use of anticoagulants: Secondary | ICD-10-CM

## 2020-11-08 DIAGNOSIS — K5903 Drug induced constipation: Secondary | ICD-10-CM

## 2020-11-08 DIAGNOSIS — B9689 Other specified bacterial agents as the cause of diseases classified elsewhere: Secondary | ICD-10-CM | POA: Diagnosis not present

## 2020-11-08 DIAGNOSIS — Z79899 Other long term (current) drug therapy: Secondary | ICD-10-CM

## 2020-11-08 DIAGNOSIS — Z9181 History of falling: Secondary | ICD-10-CM

## 2020-11-08 DIAGNOSIS — M5416 Radiculopathy, lumbar region: Secondary | ICD-10-CM | POA: Diagnosis not present

## 2020-11-08 DIAGNOSIS — E039 Hypothyroidism, unspecified: Secondary | ICD-10-CM | POA: Diagnosis not present

## 2020-11-08 DIAGNOSIS — Z79891 Long term (current) use of opiate analgesic: Secondary | ICD-10-CM

## 2020-11-08 DIAGNOSIS — I82412 Acute embolism and thrombosis of left femoral vein: Secondary | ICD-10-CM | POA: Diagnosis not present

## 2020-11-08 DIAGNOSIS — N400 Enlarged prostate without lower urinary tract symptoms: Secondary | ICD-10-CM | POA: Diagnosis not present

## 2020-11-08 DIAGNOSIS — I82422 Acute embolism and thrombosis of left iliac vein: Secondary | ICD-10-CM | POA: Diagnosis not present

## 2020-11-08 DIAGNOSIS — I10 Essential (primary) hypertension: Secondary | ICD-10-CM | POA: Diagnosis not present

## 2020-11-08 DIAGNOSIS — Z981 Arthrodesis status: Secondary | ICD-10-CM

## 2020-11-08 DIAGNOSIS — T50915D Adverse effect of multiple unspecified drugs, medicaments and biological substances, subsequent encounter: Secondary | ICD-10-CM

## 2020-11-08 DIAGNOSIS — T84296D Other mechanical complication of internal fixation device of vertebrae, subsequent encounter: Secondary | ICD-10-CM | POA: Diagnosis not present

## 2020-11-08 DIAGNOSIS — N390 Urinary tract infection, site not specified: Secondary | ICD-10-CM | POA: Diagnosis not present

## 2020-11-10 ENCOUNTER — Encounter: Payer: 59 | Attending: Registered Nurse | Admitting: Registered Nurse

## 2020-11-10 ENCOUNTER — Other Ambulatory Visit: Payer: Self-pay | Admitting: *Deleted

## 2020-11-10 ENCOUNTER — Encounter: Payer: Self-pay | Admitting: Registered Nurse

## 2020-11-10 ENCOUNTER — Other Ambulatory Visit: Payer: Self-pay

## 2020-11-10 VITALS — BP 123/84 | HR 94 | Temp 98.6°F | Ht 70.0 in | Wt 173.4 lb

## 2020-11-10 DIAGNOSIS — Z981 Arthrodesis status: Secondary | ICD-10-CM | POA: Insufficient documentation

## 2020-11-10 DIAGNOSIS — I82422 Acute embolism and thrombosis of left iliac vein: Secondary | ICD-10-CM | POA: Diagnosis not present

## 2020-11-10 DIAGNOSIS — I82412 Acute embolism and thrombosis of left femoral vein: Secondary | ICD-10-CM | POA: Diagnosis not present

## 2020-11-10 DIAGNOSIS — E039 Hypothyroidism, unspecified: Secondary | ICD-10-CM | POA: Diagnosis not present

## 2020-11-10 DIAGNOSIS — I10 Essential (primary) hypertension: Secondary | ICD-10-CM | POA: Diagnosis not present

## 2020-11-10 DIAGNOSIS — T84216A Breakdown (mechanical) of internal fixation device of vertebrae, initial encounter: Secondary | ICD-10-CM | POA: Insufficient documentation

## 2020-11-10 DIAGNOSIS — T84296D Other mechanical complication of internal fixation device of vertebrae, subsequent encounter: Secondary | ICD-10-CM | POA: Diagnosis not present

## 2020-11-10 DIAGNOSIS — N400 Enlarged prostate without lower urinary tract symptoms: Secondary | ICD-10-CM | POA: Diagnosis not present

## 2020-11-10 DIAGNOSIS — G8918 Other acute postprocedural pain: Secondary | ICD-10-CM | POA: Insufficient documentation

## 2020-11-10 DIAGNOSIS — B9689 Other specified bacterial agents as the cause of diseases classified elsewhere: Secondary | ICD-10-CM | POA: Diagnosis not present

## 2020-11-10 DIAGNOSIS — M5416 Radiculopathy, lumbar region: Secondary | ICD-10-CM | POA: Insufficient documentation

## 2020-11-10 DIAGNOSIS — N390 Urinary tract infection, site not specified: Secondary | ICD-10-CM | POA: Diagnosis not present

## 2020-11-10 NOTE — Progress Notes (Signed)
Subjective:    Patient ID: Alexander Rojas, male    DOB: Dec 26, 1955, 65 y.o.   MRN: [id number]  HPI: Alexander Rojas is a 65 y.o. male who is here for transitional care visit, for the follow up of his Lumbar Radiculopathy, S/P Lumbar Fusion, Hardware Failure and Neuropathic pain and post-operative pain. He presented to [facility] on 12/07/2021with complaints of severe right lower extremity pain with movement. He recently underwent : see below by Dr Ronnald Ramp on 09/19/2020.  Anterior Lumbar Interbody Fusion Lumbar Five-Sacral One, posterior lateral fusion with gill decompression Lumbar Five-Sacral One. N/A General  anterior    LAMINECTOMY WITH POSTERIOR LATERAL ARTHRODESIS LEVEL 1 N/A General  posterior     DG: Spine: 09/26/2020:  IMPRESSION: 1. Posterior decompression and fusion procedure at the level of L5-S1, without plain film evidence to suggest the presence of hardware failure.  He underwent : See below by Dr Ronnald Ramp on 09/27/2020 Lumbar exploration with Extension of Posterior Instrumentation Lumbar Four to the Ilium with Airo.   Alexander Rojas was admitted to inpatient rehabilitation on 09/27/2020 and discharged home on 10/27/2020. He is receiving home health therapy with Alexander Rojas. He states he has pain in his right hip and right lower extremity. He rates his pain 3. Also reports he has a good appetite.  [name] Morphine equivalent is 180.00 MME. He also reports his Nucynta was changed to 75 mg 4 times a day as needed for pain by Dr Ronnald Ramp.   Pain Inventory Average Pain 2 Pain Right Now 3 My pain is intermittent and stabbing  In the last 24 hours, has pain interfered with the following? General activity 3 Relation with others 3 Enjoyment of life 9 What TIME of day is your pain at its worst? evening Sleep (in general) Good  Pain is worse with: sitting and some activites Pain improves with: rest, medication and standing up. Relief from Meds: 8  use a cane use a  walker how many minutes can you walk? 10 mins ability to climb steps?  yes do you drive?  no Do you have any goals in this area?  yes  employed # of hrs/week Surgeon I need assistance with the following:  dressing, bathing and toileting Do you have any goals in this area?  yes  trouble walking depression  Any changes since last visit?  yes CT/MRI [facility] at [facility]  Any changes since last visit?  no    No family history on file. Social History   Socioeconomic History  . Marital status: Married    Spouse name: Not on file  . Number of children: Not on file  . Years of education: Not on file  . Highest education level: Not on file  Occupational History  . Not on file  Tobacco Use  . Smoking status: Never Smoker  . Smokeless tobacco: Never Used  Vaping Use  . Vaping Use: Never used  Substance and Sexual Activity  . Alcohol use: Not Currently  . Drug use: Never  . Sexual activity: Not Currently  Other Topics Concern  . Not on file  Social History Narrative   Wife lives pt   Social Determinants of Health   Financial Resource Strain: Not on file  Food Insecurity: Not on file  Transportation Needs: Not on file  Physical Activity: Not on file  Stress: Not on file  Social Connections: Not on file   Past Surgical History:  Procedure Laterality Date  . ABDOMINAL  EXPOSURE N/A 09/19/2020   Procedure: ABDOMINAL EXPOSURE;  Surgeon: Rosetta Posner, MD;  Location: [facility] OR;  Service: Vascular;  Laterality: N/A;  anterior  . ANTERIOR LUMBAR FUSION N/A 09/19/2020   Procedure: Anterior Lumbar Interbody Fusion Lumbar Five-Sacral One, posterior lateral fusion with gill decompression Lumbar Five-Sacral One.;  Surgeon: Eustace Moore, MD;  Location: [place];  Service: Neurosurgery;  Laterality: N/A;  anterior  . APPLICATION OF INTRAOPERATIVE CT SCAN N/A 09/27/2020   Procedure: APPLICATION OF INTRAOPERATIVE CT SCAN;  Surgeon: Eustace Moore, MD;  Location: [place];  Service: Neurosurgery;   Laterality: N/A;  . COLONOSCOPY WITH PROPOFOL N/A 12/18/2018   Procedure: COLONOSCOPY WITH BIOPSY;  Surgeon: Lucilla Lame, MD;  Location: [place];  Service: Endoscopy;  Laterality: N/A;  NEEDS TO STAY FIRST  . ESOPHAGOGASTRODUODENOSCOPY    . ESOPHAGOGASTRODUODENOSCOPY (EGD) WITH PROPOFOL N/A 02/23/2019   Procedure: ESOPHAGOGASTRODUODENOSCOPY (EGD) WITH PROPOFOL;  Surgeon: Lucilla Lame, MD;  Location: [facility] ENDOSCOPY;  Service: Endoscopy;  Laterality: N/A;  . HERNIA REPAIR    . LAMINECTOMY WITH POSTERIOR LATERAL ARTHRODESIS LEVEL 1 N/A 09/19/2020   Procedure: LAMINECTOMY WITH POSTERIOR LATERAL ARTHRODESIS LEVEL 1;  Surgeon: Eustace Moore, MD;  Location: [place];  Service: Neurosurgery;  Laterality: N/A;  posterior  . LAMINECTOMY WITH POSTERIOR LATERAL ARTHRODESIS LEVEL 2 N/A 09/27/2020   Procedure: Lumbar exploration with Extension of Posterior Instrumentation Lumbar Four to the Ilium with Airo;  Surgeon: Eustace Moore, MD;  Location: [place];  Service: Neurosurgery;  Laterality: N/A;  . LIPOMA EXCISION Right    upper arm  . POLYPECTOMY N/A 12/18/2018   Procedure: POLYPECTOMY;  Surgeon: Lucilla Lame, MD;  Location: [place];  Service: Endoscopy;  Laterality: N/A;  . TENDON REPAIR Left 11/05/2019   Procedure: LEFT THUMB TENDON REPAIR;  Surgeon: Cindra Presume, MD;  Location: [place];  Service: Plastics;  Laterality: Left;  Axillary block, MAC  . UMBILICAL HERNIA REPAIR     Past Medical History:  Diagnosis Date  . Back pain   . BPH (benign prostatic hyperplasia)   . Extensor tendon laceration, finger, open wound, initial encounter    left thumb  . Hypertension   . Hypothyroidism   . Macrocytosis   . Numbness and tingling   . Pars defect with spondylolisthesis    BP 123/84   Pulse 94   Temp 98.6 F (37 C)   Ht _0  (1.778 m)   Wt 173 lb 6.4 oz (78.7 kg)   SpO2 94%   BMI 24.88 kg/m   Opioid Risk Score:   Fall Risk Score:  `1  Depression  screen PHQ 2/9  No flowsheet data found. Review of Systems  Gastrointestinal: Positive for constipation.  Musculoskeletal:       Right leg pain  All other systems reviewed and are negative.      Objective:   Physical Exam Vitals and nursing note reviewed.  Constitutional:      Appearance: Normal appearance.  Cardiovascular:     Rate and Rhythm: Normal rate and regular rhythm.     Pulses: Normal pulses.     Heart sounds: Normal heart sounds.  Pulmonary:     Effort: Pulmonary effort is normal.     Breath sounds: Normal breath sounds.  Musculoskeletal:     Cervical back: Normal range of motion and neck supple.     Comments: Normal Muscle Bulk and Muscle Testing Reveals:  Upper Extremities: Full ROM and Muscle Strength 5/5  Right Greater Trochanter Tenderness Lower Extremities: Decreased ROM and Muscle Strength 5/5 Left Lower Extremity: Full ROM and Muscle Strength 5/5 Arises from Table slowly using walker for support Narrow Based Gait   Skin:    General: Skin is warm and dry.  Neurological:     Mental Status: He is alert and oriented to person, place, and time.  Psychiatric:        Mood and Affect: Mood normal.        Behavior: Behavior normal.           Assessment & Plan:  1. Lumbar Radiculopathy, S/P Lumbar Fusion, Hardware Failure: S/P :0n 09/19/2020 by Dr Ronnald Ramp Anterior Lumbar Interbody Fusion Lumbar Five-Sacral One, posterior lateral fusion with gill decompression Lumbar Five-Sacral One. N/A General  anterior    LAMINECTOMY WITH POSTERIOR LATERAL ARTHRODESIS LEVEL 1     S/P Lumbar exploration with Extension of Posterior Instrumentation Lumbar Four to the Ilium with Airo on 09/27/2020 by Dr Ronnald Ramp.    Continue Home Health Therapy. Dr Ronnald Ramp Following.  2.  Neuropathic pain: Continue current medication regimen. Continue to Monitor.  3. Post-Operative Pain: Continue Nucynta 75 mg one tablet 4 times a day as needed for pain.   F/U in 4- 6 weeks with Dr Dagoberto Ligas.

## 2020-11-10 NOTE — Patient Outreach (Signed)
[place] [facility]) Care Management  11/10/2020  Alexander Rojas [birth year] [id number]   Transition of carefollow upcall/  Referral received:10/30/20 Initial outreach:10/30/20 Insurance: [employer]    Subjective: Unsuccessful outreach call to patient, no answer able to leave a HIPAA compliant voice mail message for return call.  Follow up call to patient after  his recent call to Care coordinator .    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 He was discharged to [facility] inpatient rehab, 10/10/20- 10/27/20. PMHX: Anterior Lumbar interbody fusion lumbar 5 - Sacral one, hypertension. He was discharged to home on1/7/22with home health services of physical and occupational services by [facility]   Plan Will await return call if no response will plan outreach call in the next 4 business days.    Joylene Draft, RN, BSN  [place] Management Coordinator  [phone]- Mobile [phone]- Toll Free Main Office

## 2020-11-12 ENCOUNTER — Encounter: Payer: Self-pay | Admitting: Registered Nurse

## 2020-11-14 ENCOUNTER — Telehealth: Payer: Self-pay | Admitting: *Deleted

## 2020-11-14 DIAGNOSIS — N390 Urinary tract infection, site not specified: Secondary | ICD-10-CM | POA: Diagnosis not present

## 2020-11-14 DIAGNOSIS — E039 Hypothyroidism, unspecified: Secondary | ICD-10-CM | POA: Diagnosis not present

## 2020-11-14 DIAGNOSIS — M5416 Radiculopathy, lumbar region: Secondary | ICD-10-CM | POA: Diagnosis not present

## 2020-11-14 DIAGNOSIS — I82412 Acute embolism and thrombosis of left femoral vein: Secondary | ICD-10-CM | POA: Diagnosis not present

## 2020-11-14 DIAGNOSIS — B9689 Other specified bacterial agents as the cause of diseases classified elsewhere: Secondary | ICD-10-CM | POA: Diagnosis not present

## 2020-11-14 DIAGNOSIS — I10 Essential (primary) hypertension: Secondary | ICD-10-CM | POA: Diagnosis not present

## 2020-11-14 DIAGNOSIS — N400 Enlarged prostate without lower urinary tract symptoms: Secondary | ICD-10-CM | POA: Diagnosis not present

## 2020-11-14 DIAGNOSIS — I82422 Acute embolism and thrombosis of left iliac vein: Secondary | ICD-10-CM | POA: Diagnosis not present

## 2020-11-14 DIAGNOSIS — T84296D Other mechanical complication of internal fixation device of vertebrae, subsequent encounter: Secondary | ICD-10-CM | POA: Diagnosis not present

## 2020-11-14 NOTE — Telephone Encounter (Signed)
Erlene Quan OT [facility] called to request extension on OT visits for Mr Tagg.  Approval given.

## 2020-11-15 ENCOUNTER — Other Ambulatory Visit: Payer: Self-pay | Admitting: *Deleted

## 2020-11-15 NOTE — Patient Outreach (Signed)
[place] [facility]) Care Management  11/15/2020  Alexander Rojas 13-Mar-1956 [id number]   Transition of carefollow upcall/  Referral received:10/30/20 Initial outreach:10/30/20 Insurance: [employer]    Subjective: Incoming call from patient , reports that he is making some progress, expressed appreciation with home health  therapy services receiving. Alexander Rojas voiced having questions on how to use My chart, he does have an active account. He is agreeable to receiving contact information for technical support on use.  He discussed having questions about his hospital bill and paying incoming bills questions if he can pay it at one time. He is agreeable to receiving contact number for benefit line and [place] billing.   Reviewed patient prior concerns regarding cost of copayment for recent prescription filled, discussed follow up call to pharmacy that recommends he contact number on back of insurance for customer service. He voiced that he has the SAVE plan.    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 He was discharged to [facility] inpatient rehab, 10/10/20- 10/27/20. PMHX: Anterior Lumbar interbody fusion lumbar 5 - Sacral one, hypertension. He was discharged to home on1/7/22with home health services of physical and occupational services by [facility]   Plan No ongoing care management needs to be addressed, will send above contact information to benefits office, billing office and My technical support to patient personal email as requested.  Will close to [facility] care management services.   Joylene Draft, RN, BSN  [place] Management Coordinator  [phone]- Mobile [phone]- Toll Free Main Office

## 2020-11-16 DIAGNOSIS — I82412 Acute embolism and thrombosis of left femoral vein: Secondary | ICD-10-CM | POA: Diagnosis not present

## 2020-11-16 DIAGNOSIS — I82422 Acute embolism and thrombosis of left iliac vein: Secondary | ICD-10-CM | POA: Diagnosis not present

## 2020-11-16 DIAGNOSIS — E039 Hypothyroidism, unspecified: Secondary | ICD-10-CM | POA: Diagnosis not present

## 2020-11-16 DIAGNOSIS — T84296D Other mechanical complication of internal fixation device of vertebrae, subsequent encounter: Secondary | ICD-10-CM | POA: Diagnosis not present

## 2020-11-16 DIAGNOSIS — I10 Essential (primary) hypertension: Secondary | ICD-10-CM | POA: Diagnosis not present

## 2020-11-16 DIAGNOSIS — M5416 Radiculopathy, lumbar region: Secondary | ICD-10-CM | POA: Diagnosis not present

## 2020-11-16 DIAGNOSIS — N400 Enlarged prostate without lower urinary tract symptoms: Secondary | ICD-10-CM | POA: Diagnosis not present

## 2020-11-16 DIAGNOSIS — N390 Urinary tract infection, site not specified: Secondary | ICD-10-CM | POA: Diagnosis not present

## 2020-11-16 DIAGNOSIS — B9689 Other specified bacterial agents as the cause of diseases classified elsewhere: Secondary | ICD-10-CM | POA: Diagnosis not present

## 2020-11-17 ENCOUNTER — Telehealth: Payer: Self-pay

## 2020-11-17 NOTE — Telephone Encounter (Signed)
Collier Salina, PT from [facility] requesting HHPT ext 2wk4. Orders approved and given.

## 2020-11-21 ENCOUNTER — Ambulatory Visit: Payer: Self-pay | Admitting: *Deleted

## 2020-11-21 DIAGNOSIS — T84296D Other mechanical complication of internal fixation device of vertebrae, subsequent encounter: Secondary | ICD-10-CM | POA: Diagnosis not present

## 2020-11-21 DIAGNOSIS — I82422 Acute embolism and thrombosis of left iliac vein: Secondary | ICD-10-CM | POA: Diagnosis not present

## 2020-11-21 DIAGNOSIS — I82412 Acute embolism and thrombosis of left femoral vein: Secondary | ICD-10-CM | POA: Diagnosis not present

## 2020-11-21 DIAGNOSIS — N390 Urinary tract infection, site not specified: Secondary | ICD-10-CM | POA: Diagnosis not present

## 2020-11-21 DIAGNOSIS — I10 Essential (primary) hypertension: Secondary | ICD-10-CM | POA: Diagnosis not present

## 2020-11-21 DIAGNOSIS — B9689 Other specified bacterial agents as the cause of diseases classified elsewhere: Secondary | ICD-10-CM | POA: Diagnosis not present

## 2020-11-21 DIAGNOSIS — E039 Hypothyroidism, unspecified: Secondary | ICD-10-CM | POA: Diagnosis not present

## 2020-11-21 DIAGNOSIS — N400 Enlarged prostate without lower urinary tract symptoms: Secondary | ICD-10-CM | POA: Diagnosis not present

## 2020-11-21 DIAGNOSIS — M5416 Radiculopathy, lumbar region: Secondary | ICD-10-CM | POA: Diagnosis not present

## 2020-11-23 ENCOUNTER — Other Ambulatory Visit: Payer: Self-pay | Admitting: Physical Medicine and Rehabilitation

## 2020-11-23 MED ORDER — RIVAROXABAN 20 MG PO TABS: 20.0000 mg | ORAL_TABLET | Freq: Every day | ORAL | 2 refills | Status: DC

## 2020-11-23 MED ORDER — NALOXEGOL OXALATE 25 MG PO TABS: 25.0000 mg | ORAL_TABLET | Freq: Every day | ORAL | 5 refills | Status: DC

## 2020-11-23 MED ORDER — PREGABALIN 150 MG PO CAPS: 150.0000 mg | ORAL_CAPSULE | Freq: Three times a day (TID) | ORAL | 5 refills | Status: DC

## 2020-11-23 NOTE — Telephone Encounter (Signed)
Sent in refills for Lyrica 150 mg TID with 5 RFs, Movantik 25 mg daily with 5 RFs, and Xarelto 20 mg daily- 2 refills just in case.

## 2020-11-24 DIAGNOSIS — I82422 Acute embolism and thrombosis of left iliac vein: Secondary | ICD-10-CM | POA: Diagnosis not present

## 2020-11-24 DIAGNOSIS — B9689 Other specified bacterial agents as the cause of diseases classified elsewhere: Secondary | ICD-10-CM | POA: Diagnosis not present

## 2020-11-24 DIAGNOSIS — N390 Urinary tract infection, site not specified: Secondary | ICD-10-CM | POA: Diagnosis not present

## 2020-11-24 DIAGNOSIS — T84296D Other mechanical complication of internal fixation device of vertebrae, subsequent encounter: Secondary | ICD-10-CM | POA: Diagnosis not present

## 2020-11-24 DIAGNOSIS — N400 Enlarged prostate without lower urinary tract symptoms: Secondary | ICD-10-CM | POA: Diagnosis not present

## 2020-11-24 DIAGNOSIS — I10 Essential (primary) hypertension: Secondary | ICD-10-CM | POA: Diagnosis not present

## 2020-11-24 DIAGNOSIS — M5416 Radiculopathy, lumbar region: Secondary | ICD-10-CM | POA: Diagnosis not present

## 2020-11-24 DIAGNOSIS — I82412 Acute embolism and thrombosis of left femoral vein: Secondary | ICD-10-CM | POA: Diagnosis not present

## 2020-11-24 DIAGNOSIS — E039 Hypothyroidism, unspecified: Secondary | ICD-10-CM | POA: Diagnosis not present

## 2020-11-28 ENCOUNTER — Other Ambulatory Visit: Payer: Self-pay | Admitting: Physical Medicine and Rehabilitation

## 2020-11-28 DIAGNOSIS — I82412 Acute embolism and thrombosis of left femoral vein: Secondary | ICD-10-CM | POA: Diagnosis not present

## 2020-11-28 DIAGNOSIS — N400 Enlarged prostate without lower urinary tract symptoms: Secondary | ICD-10-CM | POA: Diagnosis not present

## 2020-11-28 DIAGNOSIS — M5416 Radiculopathy, lumbar region: Secondary | ICD-10-CM | POA: Diagnosis not present

## 2020-11-28 DIAGNOSIS — N390 Urinary tract infection, site not specified: Secondary | ICD-10-CM | POA: Diagnosis not present

## 2020-11-28 DIAGNOSIS — T84296D Other mechanical complication of internal fixation device of vertebrae, subsequent encounter: Secondary | ICD-10-CM | POA: Diagnosis not present

## 2020-11-28 DIAGNOSIS — I82422 Acute embolism and thrombosis of left iliac vein: Secondary | ICD-10-CM | POA: Diagnosis not present

## 2020-11-28 DIAGNOSIS — I10 Essential (primary) hypertension: Secondary | ICD-10-CM | POA: Diagnosis not present

## 2020-11-28 DIAGNOSIS — B9689 Other specified bacterial agents as the cause of diseases classified elsewhere: Secondary | ICD-10-CM | POA: Diagnosis not present

## 2020-11-28 DIAGNOSIS — E039 Hypothyroidism, unspecified: Secondary | ICD-10-CM | POA: Diagnosis not present

## 2020-11-28 MED ORDER — LEVETIRACETAM 500 MG PO TABS: 500.0000 mg | ORAL_TABLET | Freq: Two times a day (BID) | ORAL | 5 refills | Status: DC

## 2020-11-28 NOTE — Telephone Encounter (Signed)
We discussed swelling and nerve pain.  Will try to start Keppra 500 mg BID x 4-5 days then 1000 mg BID-  Main side effect is irritability in 3%- improved with Vitamin B complex over the counter.   Hopefully around week 2, pain will be doing better, so can wean Lyrica by 1 pill every 4-7 days, to off-if things going well- if not, let me know.   will send Rx to pharmacy Can also try to wean Nucynta around this time at 37.5 mg QID currently- wean by 1 tab every few days- if still needs Rx, will continue to provide it, if needed- if not, let's see how things go- let me know in 1-2 weeks.

## 2020-12-01 DIAGNOSIS — B9689 Other specified bacterial agents as the cause of diseases classified elsewhere: Secondary | ICD-10-CM | POA: Diagnosis not present

## 2020-12-01 DIAGNOSIS — N390 Urinary tract infection, site not specified: Secondary | ICD-10-CM | POA: Diagnosis not present

## 2020-12-01 DIAGNOSIS — E039 Hypothyroidism, unspecified: Secondary | ICD-10-CM | POA: Diagnosis not present

## 2020-12-01 DIAGNOSIS — M5416 Radiculopathy, lumbar region: Secondary | ICD-10-CM | POA: Diagnosis not present

## 2020-12-01 DIAGNOSIS — T84296D Other mechanical complication of internal fixation device of vertebrae, subsequent encounter: Secondary | ICD-10-CM | POA: Diagnosis not present

## 2020-12-01 DIAGNOSIS — I82422 Acute embolism and thrombosis of left iliac vein: Secondary | ICD-10-CM | POA: Diagnosis not present

## 2020-12-01 DIAGNOSIS — I82412 Acute embolism and thrombosis of left femoral vein: Secondary | ICD-10-CM | POA: Diagnosis not present

## 2020-12-01 DIAGNOSIS — N400 Enlarged prostate without lower urinary tract symptoms: Secondary | ICD-10-CM | POA: Diagnosis not present

## 2020-12-01 DIAGNOSIS — I10 Essential (primary) hypertension: Secondary | ICD-10-CM | POA: Diagnosis not present

## 2020-12-04 ENCOUNTER — Other Ambulatory Visit (INDEPENDENT_AMBULATORY_CARE_PROVIDER_SITE_OTHER): Payer: Self-pay | Admitting: Vascular Surgery

## 2020-12-04 ENCOUNTER — Telehealth (INDEPENDENT_AMBULATORY_CARE_PROVIDER_SITE_OTHER): Payer: Self-pay | Admitting: Vascular Surgery

## 2020-12-04 DIAGNOSIS — I8290 Acute embolism and thrombosis of unspecified vein: Secondary | ICD-10-CM

## 2020-12-04 DIAGNOSIS — Z86718 Personal history of other venous thrombosis and embolism: Secondary | ICD-10-CM

## 2020-12-04 DIAGNOSIS — Z7409 Other reduced mobility: Secondary | ICD-10-CM

## 2020-12-04 NOTE — Telephone Encounter (Signed)
Hi Alexander Rojas can you please set up Dr. Genevive Bi for a bilateral lower extremity DVT scan. His wife will need to bring him so he is requesting the earliest possible morning appointment he also noted he has doctors appointments on the 21st and 22nd so those would not work. [name] has a Copy so he could not do tomorrow either. If it would be possible to fit that in the next week or so that would be awesome. Thank you so much; we can call him with the results. Per GS

## 2020-12-05 DIAGNOSIS — N390 Urinary tract infection, site not specified: Secondary | ICD-10-CM | POA: Diagnosis not present

## 2020-12-05 DIAGNOSIS — I82422 Acute embolism and thrombosis of left iliac vein: Secondary | ICD-10-CM | POA: Diagnosis not present

## 2020-12-05 DIAGNOSIS — T84296D Other mechanical complication of internal fixation device of vertebrae, subsequent encounter: Secondary | ICD-10-CM | POA: Diagnosis not present

## 2020-12-05 DIAGNOSIS — I10 Essential (primary) hypertension: Secondary | ICD-10-CM | POA: Diagnosis not present

## 2020-12-05 DIAGNOSIS — B9689 Other specified bacterial agents as the cause of diseases classified elsewhere: Secondary | ICD-10-CM | POA: Diagnosis not present

## 2020-12-05 DIAGNOSIS — E039 Hypothyroidism, unspecified: Secondary | ICD-10-CM | POA: Diagnosis not present

## 2020-12-05 DIAGNOSIS — N400 Enlarged prostate without lower urinary tract symptoms: Secondary | ICD-10-CM | POA: Diagnosis not present

## 2020-12-05 DIAGNOSIS — M5416 Radiculopathy, lumbar region: Secondary | ICD-10-CM | POA: Diagnosis not present

## 2020-12-05 DIAGNOSIS — I82412 Acute embolism and thrombosis of left femoral vein: Secondary | ICD-10-CM | POA: Diagnosis not present

## 2020-12-06 ENCOUNTER — Ambulatory Visit (INDEPENDENT_AMBULATORY_CARE_PROVIDER_SITE_OTHER): Payer: 59

## 2020-12-06 ENCOUNTER — Other Ambulatory Visit: Payer: Self-pay

## 2020-12-06 DIAGNOSIS — I8290 Acute embolism and thrombosis of unspecified vein: Secondary | ICD-10-CM | POA: Diagnosis not present

## 2020-12-06 DIAGNOSIS — Z86718 Personal history of other venous thrombosis and embolism: Secondary | ICD-10-CM

## 2020-12-06 DIAGNOSIS — Z7409 Other reduced mobility: Secondary | ICD-10-CM

## 2020-12-07 NOTE — Progress Notes (Signed)
[place] Phone: [phone] Subjective:   Fontaine No, am serving as a scribe for Dr. Hulan Saas. This visit occurred during the SARS-CoV-2 public health emergency.  Safety protocols were in place, including screening questions prior to the visit, additional usage of staff PPE, and extensive cleaning of exam room while observing appropriate contact time as indicated for disinfecting solutions.   I'm seeing this patient by the request  of:  Adin Hector, MD  CC: Low back pain, piriformis pain.  SLH:[id number]  NESTA KIMPLE is a 65 y.o. male coming in with complaint of low back pain and buttocks pain.  Patient has had lumbar radiculopathy status post lumbar fusion with hardware failure and neuropathic pain and postoperative pain. Patient did have an interbody fusion of L5 and S1 anterior and posterior with Gill decompression and laminectomy. Initial surgery 11/30- readmitted 12/7 with severe radiculopathy after loss of anterior fixation and fracture of superior anterior portion of the sacrum. Underwent posterior fixation to the illeum for stabilization  Admitted to rehab 12/21 DVT dx 12/22 Lateral buttocks trigger points given 10/24/20 Undergo rehab inpatient until 10/27/20 to home health and OT  Patient then was in unfortunately inpatient rehabilitation from December 8 and was just discharged January 7  Patient has been on medications including morphine and Nucynta Change lyrica to [place] 2/8  MRI lumbar 11/04/20 Posterior fusion spanning L4-S1 no significant canal stenosis but persistent anterolisthesis of L5-S1with moderate foraminal stenosis and right subarticular recess stenosis at L4/5 Patient started seeing PMR 1/21  12/07/20 doppler showed no DVT  Scheduled for piriformis injection on Tuesday of next week   Discussed with patient a little bit on the phone the night prior to the office visit and then  discussed again today.  Patient states since the surgery patient has had unfortunately bilateral foot pain and burning.  Patient states right greater than left.  Has been there consistently on the right side that is significantly severe.  Patient is continuing to have intermittent pain that seems to be sometimes on the front, back or even side of the leg.  Sometimes can last seconds to minutes.  States that if he does put pressure in the buttocks area while walking sometimes does give him some help.  Patient states that neurosurgeon working diagnosis is the potential for piriformis syndrome.  Patient using Lyrica 18m TID Kepra 1g BID Nucynta 726mQID has been using half dose trying to stop taking  Pain and tingling in right leg. Tingling in foot and pain constantly. Past Medical History:  Diagnosis Date  . Back pain   . BPH (benign prostatic hyperplasia)   . Extensor tendon laceration, finger, open wound, initial encounter    left thumb  . Hypertension   . Hypothyroidism   . Macrocytosis   . Numbness and tingling   . Pars defect with spondylolisthesis    Past Surgical History:  Procedure Laterality Date  . ABDOMINAL EXPOSURE N/A 09/19/2020   Procedure: ABDOMINAL EXPOSURE;  Surgeon: EaRosetta PosnerMD;  [facility];  Service: Vascular;  Laterality: N/A;  anterior  . ANTERIOR LUMBAR FUSION N/A 09/19/2020   Procedure: Anterior Lumbar Interbody Fusion Lumbar Five-Sacral One, posterior lateral fusion with gill decompression Lumbar Five-Sacral One.;  Surgeon: JoEustace MooreMD;  [facility] Service: Neurosurgery;  Laterality: N/A;  anterior  . APPLICATION OF INTRAOPERATIVE CT SCAN N/A 09/27/2020   Procedure: APPLICATION OF  INTRAOPERATIVE CT SCAN;  Surgeon: Eustace Moore, MD;  Location: [place];  Service: Neurosurgery;  Laterality: N/A;  . COLONOSCOPY WITH PROPOFOL N/A 12/18/2018   Procedure: COLONOSCOPY WITH BIOPSY;  Surgeon: Lucilla Lame, MD;  Location: [place];  Service:  Endoscopy;  Laterality: N/A;  NEEDS TO STAY FIRST  . ESOPHAGOGASTRODUODENOSCOPY    . ESOPHAGOGASTRODUODENOSCOPY (EGD) WITH PROPOFOL N/A 02/23/2019   Procedure: ESOPHAGOGASTRODUODENOSCOPY (EGD) WITH PROPOFOL;  Surgeon: Lucilla Lame, MD;  Location[facility] ENDOSCOPY;  Service: Endoscopy;  Laterality: N/A;  . HERNIA REPAIR    . LAMINECTOMY WITH POSTERIOR LATERAL ARTHRODESIS LEVEL 1 N/A 09/19/2020   Procedure: LAMINECTOMY WITH POSTERIOR LATERAL ARTHRODESIS LEVEL 1;  Surgeon: Eustace Moore, MD;  Location: [place];  Service: Neurosurgery;  Laterality: N/A;  posterior  . LAMINECTOMY WITH POSTERIOR LATERAL ARTHRODESIS LEVEL 2 N/A 09/27/2020   Procedure: Lumbar exploration with Extension of Posterior Instrumentation Lumbar Four to the Ilium with Airo;  Surgeon: Eustace Moore, MD;  Location: [place];  Service: Neurosurgery;  Laterality: N/A;  . LIPOMA EXCISION Right    upper arm  . POLYPECTOMY N/A 12/18/2018   Procedure: POLYPECTOMY;  Surgeon: Lucilla Lame, MD;  Location: [place];  Service: Endoscopy;  Laterality: N/A;  . TENDON REPAIR Left 11/05/2019   Procedure: LEFT THUMB TENDON REPAIR;  Surgeon: Cindra Presume, MD;  Location: [place];  Service: Plastics;  Laterality: Left;  Axillary block, MAC  . UMBILICAL HERNIA REPAIR     Social History   Socioeconomic History  . Marital status: Married    Spouse name: Not on file  . Number of children: Not on file  . Years of education: Not on file  . Highest education level: Not on file  Occupational History  . Not on file  Tobacco Use  . Smoking status: Never Smoker  . Smokeless tobacco: Never Used  Vaping Use  . Vaping Use: Never used  Substance and Sexual Activity  . Alcohol use: Not Currently  . Drug use: Never  . Sexual activity: Not Currently  Other Topics Concern  . Not on file  Social History Narrative   Wife lives pt   Social Determinants of Health   Financial Resource Strain: Not on file  Food Insecurity: Not  on file  Transportation Needs: Not on file  Physical Activity: Not on file  Stress: Not on file  Social Connections: Not on file   No Known Allergies No family history on file.  Current Outpatient Medications (Endocrine & Metabolic):  .  levothyroxine (SYNTHROID) 100 MCG tablet, Take 100 mcg by mouth daily before breakfast.     Current Outpatient Medications (Analgesics):  .  acetaminophen (TYLENOL) 325 MG tablet, Take 2 tablets (650 mg total) by mouth every 4 (four) hours as needed for mild pain ((score 1 to 3) or temp > 100.5). Marland Kitchen  meloxicam (MOBIC) 7.5 MG tablet, Take 7.5 mg by mouth 2 (two) times daily. .  tapentadol HCl (NUCYNTA) 75 MG tablet, Take 2 tablets (150 mg total) by mouth 4 (four) times daily -  with meals and at bedtime. .  traMADol (ULTRAM) 50 MG tablet, Take 2 tablets (100 mg total) by mouth every 6 (six) hours as needed for moderate pain (for breakthrough pain).  Current Outpatient Medications (Hematological):  .  rivaroxaban (XARELTO) 20 MG TABS tablet, Take 1 tablet (20 mg total) by mouth daily with supper.  Current Outpatient Medications (Other):  .  ascorbic acid (VITAMIN C) 500 MG tablet,  Take 1 tablet (500 mg total) by mouth 2 (two) times daily. .  calcium-vitamin D (OSCAL WITH D) 500-200 MG-UNIT tablet, Take 1 tablet by mouth daily. Marland Kitchen  docusate sodium (COLACE) 100 MG capsule, Take 1 capsule (100 mg total) by mouth 2 (two) times daily. Marland Kitchen  levETIRAcetam (KEPPRA) 500 MG tablet, Take 1 tablet (500 mg total) by mouth 2 (two) times daily. X 4-5 days, then 1000 mg BID- for nerve pain .  methocarbamol (ROBAXIN) 750 MG tablet, Take 1 tablet (750 mg total) by mouth every 8 (eight) hours. .  naloxegol oxalate (MOVANTIK) 25 MG TABS tablet, Take 1 tablet (25 mg total) by mouth daily. .  polyethylene glycol (MIRALAX / GLYCOLAX) 17 g packet, Take 17 g by mouth daily. .  pregabalin (LYRICA) 150 MG capsule, Take 1 capsule (150 mg total) by mouth 3 (three) times daily. .   vitamin A 3 MG (10000 UNITS) capsule, Take 1 capsule (10,000 Units total) by mouth daily.   Reviewed prior external information including notes and imaging from  primary care provider As well as notes that were available from care everywhere and other healthcare systems.  Past medical history, social, surgical and family history all reviewed in electronic medical record.  No pertanent information unless stated regarding to the chief complaint.   Review of Systems:  No headache, visual changes, nausea, vomiting, diarrhea,  dizziness, abdominal pain, skin rash, fevers, chills, night sweats, weight loss, swollen lymph nodes,  joint swelling, chest pain, shortness of breath, mood changes. POSITIVE muscle aches, body aches, impotence, constipation  Objective  Blood pressure 138/86, pulse 70, height _0  (1.778 m), weight 186 lb (84.4 kg), SpO2 98 %.   General: No apparent distress alert and oriented x3 mood and affect normal, dressed appropriately.  HEENT: Pupils equal, extraocular movements intact  Respiratory: Patient's speak in full sentences and does not appear short of breath  Cardiovascular: 2+ lower extremity edema all the way to proximal tibia bilaterally, mild tender, no erythema  Gait antalgic with patient keeping back is straight as possible. Patient has a negative straight leg test which is unremarkable. Patient does have good strength of the feet and is neurovascularly intact. Patient does have reflexes noted of the Achilles bilaterally. Good hip flexion noted. Significant tightness with Corky Sox bilaterally mild discomfort more in the piriformis in the gluteal area on the right compared to the left. Mild pain over the right sacroiliac joint.   Impression and Recommendations:     The above documentation has been reviewed and is accurate and complete Lyndal Pulley, DO

## 2020-12-08 ENCOUNTER — Other Ambulatory Visit: Payer: Self-pay

## 2020-12-08 ENCOUNTER — Ambulatory Visit (INDEPENDENT_AMBULATORY_CARE_PROVIDER_SITE_OTHER): Payer: 59 | Admitting: Family Medicine

## 2020-12-08 ENCOUNTER — Encounter: Payer: Self-pay | Admitting: Family Medicine

## 2020-12-08 VITALS — BP 138/86 | HR 70 | Ht 70.0 in | Wt 186.0 lb

## 2020-12-08 DIAGNOSIS — M2550 Pain in unspecified joint: Secondary | ICD-10-CM | POA: Diagnosis not present

## 2020-12-08 DIAGNOSIS — T84216A Breakdown (mechanical) of internal fixation device of vertebrae, initial encounter: Secondary | ICD-10-CM

## 2020-12-08 DIAGNOSIS — M5416 Radiculopathy, lumbar region: Secondary | ICD-10-CM | POA: Diagnosis not present

## 2020-12-08 DIAGNOSIS — I824Y1 Acute embolism and thrombosis of unspecified deep veins of right proximal lower extremity: Secondary | ICD-10-CM

## 2020-12-08 DIAGNOSIS — Z981 Arthrodesis status: Secondary | ICD-10-CM | POA: Diagnosis not present

## 2020-12-08 DIAGNOSIS — I82409 Acute embolism and thrombosis of unspecified deep veins of unspecified lower extremity: Secondary | ICD-10-CM | POA: Insufficient documentation

## 2020-12-08 HISTORY — DX: Hypocalcemia: E83.51

## 2020-12-08 LAB — CBC WITH DIFFERENTIAL/PLATELET
Basophils Absolute: 0.1 10*3/uL (ref 0.0–0.1)
Basophils Relative: 1.4 % (ref 0.0–3.0)
Eosinophils Absolute: 0.2 10*3/uL (ref 0.0–0.7)
Eosinophils Relative: 4.1 % (ref 0.0–5.0)
HCT: 40.9 % (ref 39.0–52.0)
Hemoglobin: 13.9 g/dL (ref 13.0–17.0)
Lymphocytes Relative: 25.9 % (ref 12.0–46.0)
Lymphs Abs: 1.5 10*3/uL (ref 0.7–4.0)
MCHC: 33.9 g/dL (ref 30.0–36.0)
MCV: 94.9 fl (ref 78.0–100.0)
Monocytes Absolute: 0.4 10*3/uL (ref 0.1–1.0)
Monocytes Relative: 7.2 % (ref 3.0–12.0)
Neutro Abs: 3.5 10*3/uL (ref 1.4–7.7)
Neutrophils Relative %: 61.4 % (ref 43.0–77.0)
Platelets: 208 10*3/uL (ref 150.0–400.0)
RBC: 4.31 Mil/uL (ref 4.22–5.81)
RDW: 14.5 % (ref 11.5–15.5)
WBC: 5.7 10*3/uL (ref 4.0–10.5)

## 2020-12-08 LAB — FERRITIN: Ferritin: 21.7 ng/mL — ABNORMAL LOW (ref 22.0–322.0)

## 2020-12-08 LAB — COMPREHENSIVE METABOLIC PANEL
ALT: 19 U/L (ref 0–53)
AST: 16 U/L (ref 0–37)
Albumin: 4.3 g/dL (ref 3.5–5.2)
Alkaline Phosphatase: 79 U/L (ref 39–117)
BUN: 17 mg/dL (ref 6–23)
CO2: 30 mEq/L (ref 19–32)
Calcium: 9.1 mg/dL (ref 8.4–10.5)
Chloride: 106 mEq/L (ref 96–112)
Creatinine, Ser: 0.8 mg/dL (ref 0.40–1.50)
GFR: 94.44 mL/min (ref >60.00)
Glucose, Bld: 98 mg/dL (ref 70–99)
Potassium: 3.8 mEq/L (ref 3.5–5.1)
Sodium: 141 mEq/L (ref 135–145)
Total Bilirubin: 0.6 mg/dL (ref 0.2–1.2)
Total Protein: 6.9 g/dL (ref 6.0–8.3)

## 2020-12-08 LAB — IBC PANEL
Iron: 77 ug/dL (ref 42–165)
Saturation Ratios: 27.6 % (ref 20.0–50.0)
Transferrin: 199 mg/dL — ABNORMAL LOW (ref 212.0–360.0)

## 2020-12-08 LAB — TSH: TSH: 3.34 u[IU]/mL (ref 0.35–4.50)

## 2020-12-08 LAB — VITAMIN D 25 HYDROXY (VIT D DEFICIENCY, FRACTURES): VITD: 18.52 ng/mL — ABNORMAL LOW (ref 30.00–100.00)

## 2020-12-08 LAB — SEDIMENTATION RATE: Sed Rate: 7 mm/hr (ref 0–20)

## 2020-12-08 LAB — URIC ACID: Uric Acid, Serum: 5.6 mg/dL (ref 4.0–7.8)

## 2020-12-08 NOTE — Assessment & Plan Note (Signed)
Patient unfortunately has been in chronic pain since the surgery. Patient did have the anterior and posterior fusion with the failure of the anterior fixation. Patient did have a superior anterior portion of the sacrum that did seem to have a fracture. Patient underwent posterior fixation of the ilium for stabilization. Now continuing to have more of a right sided piriformis pain but causes bilateral radicular symptoms. Still seems to be right greater than left. Patient does have bilateral pitting edema of the lower extremities. Differential includes neuropathic pain and neuropraxia secondary to injuries during the surgery or the rehabilitation portions, we discussed the potential for reflux of the buttock dystrophy or complex regional pain syndrome, we did discuss piriformis could do this and patient is scheduled to have a piriformis injection next week. Reviewing patient's lab patient did have hypocalcemia that could be contributing to some of the muscle cramping and will reevaluate lab laboratory work-up today. Nerve conduction studies of the bilateral lower extremity ordered today to further evaluate for any further peripheral versus central nerve injury to help [place] with guidance and treatment.  Reviewed patient's imaging including the CT scan in December and patient does have the S1 screw did seem to get significantly close to the sciatic nerve. Patient's MRI on November 04, 2020 shows the patient still has some bilateral foraminal stenosis that could be contributing to some of the discomfort as well.  After discussing different treatment options with patient we will hold on any type of medication changes. Patient will see [facility] doctor and have a piriformis injection and then will get back to [place] 48 to 72 hours afterwards to see how patient responds. Depending on this we can discuss the possibility of other treatment options including changing home exercises for piriformis syndrome, MRI pelvis, as well as  prednisone short course. Patient is still on the Xarelto so would not recommend any anti-inflammatories patient will follow up with me again in 4 weeks.  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

## 2020-12-08 NOTE — Assessment & Plan Note (Signed)
Recheck calcium levels today.

## 2020-12-08 NOTE — Patient Instructions (Signed)
Bilateral EMG  Labs today We will make no medication changes at the moment Lets see how piriformis injection goes 48-72 hours after injection send me a message and we can consider prednisone Let's consider also MRI pelvis Other mediation to consider would be Cymbalta Have follow-up in 4 weeks just in case but will speak before then

## 2020-12-08 NOTE — Assessment & Plan Note (Signed)
Patient had DVT but most recent Doppler ultrasounds did not show any true clot. Patient will discuss with primary care and surgeon to discuss when to stop anticoagulation. At that time can consider the possibility of anti-inflammatories.

## 2020-12-11 ENCOUNTER — Other Ambulatory Visit: Payer: Self-pay

## 2020-12-11 ENCOUNTER — Encounter: Payer: Self-pay | Admitting: Physical Medicine and Rehabilitation

## 2020-12-11 ENCOUNTER — Encounter: Payer: 59 | Attending: Registered Nurse | Admitting: Physical Medicine and Rehabilitation

## 2020-12-11 VITALS — BP 152/88 | HR 66 | Temp 97.0°F | Ht 70.0 in | Wt 188.0 lb

## 2020-12-11 DIAGNOSIS — Z981 Arthrodesis status: Secondary | ICD-10-CM | POA: Diagnosis not present

## 2020-12-11 DIAGNOSIS — M792 Neuralgia and neuritis, unspecified: Secondary | ICD-10-CM | POA: Diagnosis not present

## 2020-12-11 DIAGNOSIS — G5622 Lesion of ulnar nerve, left upper limb: Secondary | ICD-10-CM | POA: Insufficient documentation

## 2020-12-11 DIAGNOSIS — R269 Unspecified abnormalities of gait and mobility: Secondary | ICD-10-CM | POA: Diagnosis not present

## 2020-12-11 DIAGNOSIS — M5416 Radiculopathy, lumbar region: Secondary | ICD-10-CM | POA: Diagnosis not present

## 2020-12-11 LAB — CALCIUM, IONIZED: Calcium, Ion: 4.96 mg/dL (ref 4.8–5.6)

## 2020-12-11 LAB — PTH, INTACT AND CALCIUM
Calcium: 9.3 mg/dL (ref 8.6–10.3)
PTH: 22 pg/mL (ref 14–64)

## 2020-12-11 NOTE — Patient Instructions (Signed)
Pt is a 65 yr old s/p lumbar fusion for lumbar radiculopathy.    1. Stop Keppra- - 500 mg BID x 3 days, then stop.   2. - Off balance- no improvement in pain/LE edema- Wean Lyrica- - then wean Lyrica by 1 pill every 2-3 days- ideally start wean after off Keppra.   3. Low dose Natrexone 2 mg daily x 1 week, then 4 mg daily- for nerve pain   4. Episport tennis elbow brace- for L- can get from [employer]. To protect L elbow/ulnar entrapment.    5. Has  Tried Lidoderm patches on feet- no improvement.   6.  EMG/NCS here for RLE and LUE- for ulnar nerve entrapment- and R piriformis syndrome?  7.  Looks like [place] gait- on R-con't PT if possible  8. Has Piriformis injection tomorrow- see if they can inject a little more anteriorly.    9. F/U in 4 weeks-

## 2020-12-11 NOTE — Progress Notes (Signed)
Subjective:    Patient ID: Alexander Rojas, male    DOB: [birth year], 65 y.o.   MRN: [id number]  HPI   Pt is a 65 yr old s/p lumbar fusion for lumbar radiculopathy.   Piriformis injection tomorrow. Dr Davy Pique is planning on doing it.    On Xarelto- and was told to stay on Xarelto- plans on doing it under [facility].   Still having severe pain;  LE edema- likely from Lyrica  Had Dopplers- was negative for DVT.  Tprotein and albumin is normal and not in heart failure.  Trouble is walking- cannot bend ankle- scary to go up and down stairs- and hurts back to go up/down stairs.    Very poor sleep- Walking is the only thing that helps the leg and back pain.  Sitting hurts back/leg pain.  Cannot lay flat due to back pain.   Lays on side in bed due to back pain.   Intermittent- sometimes constant pain in feet- sometimes sharp stabs or burning.    Has lost significant weight since surgery- was 200 lbs naked at surgery- now ~1 80- with LE edema- so likely ~ 170-172 lbs       Pain Inventory Average Pain 7 Pain Right Now 6 My pain is intermittent, sharp, burning, dull, stabbing, tingling and aching  In the last 24 hours, has pain interfered with the following? General activity 8 Relation with others 8 Enjoyment of life 8 What TIME of day is your pain at its worst? evening and night Sleep (in general) Poor  Pain is worse with: walking, bending, sitting and some activites Pain improves with: medication Relief from Meds: 3  No family history on file. Social History   Socioeconomic History  . Marital status: Married    Spouse name: Not on file  . Number of children: Not on file  . Years of education: Not on file  . Highest education level: Not on file  Occupational History  . Not on file  Tobacco Use  . Smoking status: Never Smoker  . Smokeless tobacco: Never Used  Vaping Use  . Vaping Use: Never used  Substance and Sexual Activity  . Alcohol use: Not Currently  .  Drug use: Never  . Sexual activity: Not Currently  Other Topics Concern  . Not on file  Social History Narrative   Wife lives pt   Social Determinants of Health   Financial Resource Strain: Not on file  Food Insecurity: Not on file  Transportation Needs: Not on file  Physical Activity: Not on file  Stress: Not on file  Social Connections: Not on file   Past Surgical History:  Procedure Laterality Date  . ABDOMINAL EXPOSURE N/A 09/19/2020   Procedure: ABDOMINAL EXPOSURE;  Surgeon: Rosetta Posner, MD;  [facility] OR;  Service: Vascular;  Laterality: N/A;  anterior  . ANTERIOR LUMBAR FUSION N/A 09/19/2020   Procedure: Anterior Lumbar Interbody Fusion Lumbar Five-Sacral One, posterior lateral fusion with gill decompression Lumbar Five-Sacral One.;  Surgeon: Eustace Moore, MD;  Location: [place];  Service: Neurosurgery;  Laterality: N/A;  anterior  . APPLICATION OF INTRAOPERATIVE CT SCAN N/A 09/27/2020   Procedure: APPLICATION OF INTRAOPERATIVE CT SCAN;  Surgeon: Eustace Moore, MD;  Location: [place];  Service: Neurosurgery;  Laterality: N/A;  . COLONOSCOPY WITH PROPOFOL N/A 12/18/2018   Procedure: COLONOSCOPY WITH BIOPSY;  Surgeon: Lucilla Lame, MD;  Location: [place];  Service: Endoscopy;  Laterality: N/A;  NEEDS TO STAY FIRST  .  ESOPHAGOGASTRODUODENOSCOPY    . ESOPHAGOGASTRODUODENOSCOPY (EGD) WITH PROPOFOL N/A 02/23/2019   Procedure: ESOPHAGOGASTRODUODENOSCOPY (EGD) WITH PROPOFOL;  Surgeon: Lucilla Lame, MD;  Location: [facility] ENDOSCOPY;  Service: Endoscopy;  Laterality: N/A;  . HERNIA REPAIR    . LAMINECTOMY WITH POSTERIOR LATERAL ARTHRODESIS LEVEL 1 N/A 09/19/2020   Procedure: LAMINECTOMY WITH POSTERIOR LATERAL ARTHRODESIS LEVEL 1;  Surgeon: Eustace Moore, MD;  Location: [place];  Service: Neurosurgery;  Laterality: N/A;  posterior  . LAMINECTOMY WITH POSTERIOR LATERAL ARTHRODESIS LEVEL 2 N/A 09/27/2020   Procedure: Lumbar exploration with Extension of Posterior Instrumentation  Lumbar Four to the Ilium with Airo;  Surgeon: Eustace Moore, MD;  Location: [place];  Service: Neurosurgery;  Laterality: N/A;  . LIPOMA EXCISION Right    upper arm  . POLYPECTOMY N/A 12/18/2018   Procedure: POLYPECTOMY;  Surgeon: Lucilla Lame, MD;  Location: [place];  Service: Endoscopy;  Laterality: N/A;  . TENDON REPAIR Left 11/05/2019   Procedure: LEFT THUMB TENDON REPAIR;  Surgeon: Cindra Presume, MD;  Location: [place];  Service: Plastics;  Laterality: Left;  Axillary block, MAC  . UMBILICAL HERNIA REPAIR     Past Surgical History:  Procedure Laterality Date  . ABDOMINAL EXPOSURE N/A 09/19/2020   Procedure: ABDOMINAL EXPOSURE;  Surgeon: Rosetta Posner, MD;  Location: [facility] OR;  Service: Vascular;  Laterality: N/A;  anterior  . ANTERIOR LUMBAR FUSION N/A 09/19/2020   Procedure: Anterior Lumbar Interbody Fusion Lumbar Five-Sacral One, posterior lateral fusion with gill decompression Lumbar Five-Sacral One.;  Surgeon: Eustace Moore, MD;  Location: [place];  Service: Neurosurgery;  Laterality: N/A;  anterior  . APPLICATION OF INTRAOPERATIVE CT SCAN N/A 09/27/2020   Procedure: APPLICATION OF INTRAOPERATIVE CT SCAN;  Surgeon: Eustace Moore, MD;  Location: [place];  Service: Neurosurgery;  Laterality: N/A;  . COLONOSCOPY WITH PROPOFOL N/A 12/18/2018   Procedure: COLONOSCOPY WITH BIOPSY;  Surgeon: Lucilla Lame, MD;  Location: [place];  Service: Endoscopy;  Laterality: N/A;  NEEDS TO STAY FIRST  . ESOPHAGOGASTRODUODENOSCOPY    . ESOPHAGOGASTRODUODENOSCOPY (EGD) WITH PROPOFOL N/A 02/23/2019   Procedure: ESOPHAGOGASTRODUODENOSCOPY (EGD) WITH PROPOFOL;  Surgeon: Lucilla Lame, MD;  Location: [facility] ENDOSCOPY;  Service: Endoscopy;  Laterality: N/A;  . HERNIA REPAIR    . LAMINECTOMY WITH POSTERIOR LATERAL ARTHRODESIS LEVEL 1 N/A 09/19/2020   Procedure: LAMINECTOMY WITH POSTERIOR LATERAL ARTHRODESIS LEVEL 1;  Surgeon: Eustace Moore, MD;  Location: [place];  Service:  Neurosurgery;  Laterality: N/A;  posterior  . LAMINECTOMY WITH POSTERIOR LATERAL ARTHRODESIS LEVEL 2 N/A 09/27/2020   Procedure: Lumbar exploration with Extension of Posterior Instrumentation Lumbar Four to the Ilium with Airo;  Surgeon: Eustace Moore, MD;  Location: [place];  Service: Neurosurgery;  Laterality: N/A;  . LIPOMA EXCISION Right    upper arm  . POLYPECTOMY N/A 12/18/2018   Procedure: POLYPECTOMY;  Surgeon: Lucilla Lame, MD;  Location: [place];  Service: Endoscopy;  Laterality: N/A;  . TENDON REPAIR Left 11/05/2019   Procedure: LEFT THUMB TENDON REPAIR;  Surgeon: Cindra Presume, MD;  Location: [place];  Service: Plastics;  Laterality: Left;  Axillary block, MAC  . UMBILICAL HERNIA REPAIR     Past Medical History:  Diagnosis Date  . Back pain   . BPH (benign prostatic hyperplasia)   . Extensor tendon laceration, finger, open wound, initial encounter    left thumb  . Hypertension   . Hypothyroidism   . Macrocytosis   .  Numbness and tingling   . Pars defect with spondylolisthesis    BP (!) 152/88   Pulse 66   Temp (!) 97 F (36.1 C)   Ht _0  (1.778 m)   Wt 188 lb (85.3 kg)   SpO2 97%   BMI 26.98 kg/m   Opioid Risk Score:   Fall Risk Score:  `1  Depression screen PHQ 2/9  Depression screen PHQ 2/9 11/10/2020  Decreased Interest 1  Down, Depressed, Hopeless 1  PHQ - 2 Score 2  Altered sleeping 0  Tired, decreased energy 1  Change in appetite 0  Feeling bad or failure about yourself  1  Trouble concentrating 2  Moving slowly or fidgety/restless 2  Suicidal thoughts 0  PHQ-9 Score 8   Review of Systems  Musculoskeletal: Positive for back pain and gait problem.       Right leg, right foot       Objective:   Physical Exam Awake, alert, wearing LSO, NAD Appears miserable  3+ LE pitting edema to  knees B/L MS: RLE- 5-/5, KE/KF 5-/5, DF and PF 5-/5 LLE- 5/5 in same msucles  Didn't bring cane in today.  Got ulnar nerve  palsy with LUE from cane in LLE- 3 weeks Sensation absent in L 4th lateral digit and 5th digit- c/w ulnar nerve entrapment Tinels (+) over L elbow- no where else.   Main buttock pain is R- just anterior to piriformis syndrome.  Gait- R hip drops and has very shortened gait on R Off balance- cannot correct with eyes closed to front or back well.     Assessment & Plan:   Pt is a 65 yr old s/p lumbar fusion for lumbar radiculopathy.    1. Stop Keppra- - 500 mg BID x 3 days, then stop.   2. - Off balance- no improvement in pain/LE edema- Wean Lyrica- - then wean Lyrica by 1 pill every 2-3 days- ideally start wean after off Keppra.   3. Low dose Natrexone 2 mg daily x 1 week, then 4 mg daily- for nerve pain   4. Episport tennis elbow brace- for L- can get from [employer]. To protect L elbow/ulnar entrapment.    5. Has  Tried Lidoderm patches on feet- no improvement.   6.  EMG/NCS here for RLE and LUE- for ulnar nerve entrapment- and R piriformis syndrome?  7.  Looks like [place] gait- on R-con't PT if possible  8. Has Piriformis injection tomorrow- see if they can inject a little more anteriorly.    9. F/U in 4 weeks- wait on Viagra.   I spent a total of

## 2020-12-12 ENCOUNTER — Other Ambulatory Visit: Payer: Self-pay

## 2020-12-12 ENCOUNTER — Other Ambulatory Visit: Payer: Self-pay | Admitting: Physical Medicine and Rehabilitation

## 2020-12-12 DIAGNOSIS — G5701 Lesion of sciatic nerve, right lower limb: Secondary | ICD-10-CM | POA: Diagnosis not present

## 2020-12-12 DIAGNOSIS — M5416 Radiculopathy, lumbar region: Secondary | ICD-10-CM

## 2020-12-12 MED ORDER — DULOXETINE HCL 30 MG PO CPEP: 30.0000 mg | ORAL_CAPSULE | Freq: Every day | ORAL | 5 refills | Status: DC

## 2020-12-13 DIAGNOSIS — N400 Enlarged prostate without lower urinary tract symptoms: Secondary | ICD-10-CM | POA: Diagnosis not present

## 2020-12-13 DIAGNOSIS — I10 Essential (primary) hypertension: Secondary | ICD-10-CM | POA: Diagnosis not present

## 2020-12-13 DIAGNOSIS — M5416 Radiculopathy, lumbar region: Secondary | ICD-10-CM | POA: Diagnosis not present

## 2020-12-13 DIAGNOSIS — I82412 Acute embolism and thrombosis of left femoral vein: Secondary | ICD-10-CM | POA: Diagnosis not present

## 2020-12-13 DIAGNOSIS — T84296D Other mechanical complication of internal fixation device of vertebrae, subsequent encounter: Secondary | ICD-10-CM | POA: Diagnosis not present

## 2020-12-13 DIAGNOSIS — B9689 Other specified bacterial agents as the cause of diseases classified elsewhere: Secondary | ICD-10-CM | POA: Diagnosis not present

## 2020-12-13 DIAGNOSIS — I82422 Acute embolism and thrombosis of left iliac vein: Secondary | ICD-10-CM | POA: Diagnosis not present

## 2020-12-13 DIAGNOSIS — N390 Urinary tract infection, site not specified: Secondary | ICD-10-CM | POA: Diagnosis not present

## 2020-12-13 DIAGNOSIS — E039 Hypothyroidism, unspecified: Secondary | ICD-10-CM | POA: Diagnosis not present

## 2020-12-14 DIAGNOSIS — M5416 Radiculopathy, lumbar region: Secondary | ICD-10-CM | POA: Diagnosis not present

## 2020-12-14 DIAGNOSIS — E039 Hypothyroidism, unspecified: Secondary | ICD-10-CM | POA: Diagnosis not present

## 2020-12-14 DIAGNOSIS — B9689 Other specified bacterial agents as the cause of diseases classified elsewhere: Secondary | ICD-10-CM | POA: Diagnosis not present

## 2020-12-14 DIAGNOSIS — N400 Enlarged prostate without lower urinary tract symptoms: Secondary | ICD-10-CM | POA: Diagnosis not present

## 2020-12-14 DIAGNOSIS — I10 Essential (primary) hypertension: Secondary | ICD-10-CM | POA: Diagnosis not present

## 2020-12-14 DIAGNOSIS — N390 Urinary tract infection, site not specified: Secondary | ICD-10-CM | POA: Diagnosis not present

## 2020-12-14 DIAGNOSIS — I82422 Acute embolism and thrombosis of left iliac vein: Secondary | ICD-10-CM | POA: Diagnosis not present

## 2020-12-14 DIAGNOSIS — T84296D Other mechanical complication of internal fixation device of vertebrae, subsequent encounter: Secondary | ICD-10-CM | POA: Diagnosis not present

## 2020-12-14 DIAGNOSIS — I82412 Acute embolism and thrombosis of left femoral vein: Secondary | ICD-10-CM | POA: Diagnosis not present

## 2020-12-15 ENCOUNTER — Telehealth: Payer: Self-pay | Admitting: *Deleted

## 2020-12-15 NOTE — Telephone Encounter (Signed)
[facility] called to request extension on PT 2wk4 to work on ambulation and balance.  Approval given.

## 2020-12-19 DIAGNOSIS — Z6827 Body mass index (BMI) 27.0-27.9, adult: Secondary | ICD-10-CM | POA: Diagnosis not present

## 2020-12-19 DIAGNOSIS — M5417 Radiculopathy, lumbosacral region: Secondary | ICD-10-CM | POA: Diagnosis not present

## 2020-12-19 DIAGNOSIS — I10 Essential (primary) hypertension: Secondary | ICD-10-CM | POA: Diagnosis not present

## 2020-12-20 DIAGNOSIS — M5416 Radiculopathy, lumbar region: Secondary | ICD-10-CM | POA: Diagnosis not present

## 2020-12-20 DIAGNOSIS — N400 Enlarged prostate without lower urinary tract symptoms: Secondary | ICD-10-CM | POA: Diagnosis not present

## 2020-12-20 DIAGNOSIS — I10 Essential (primary) hypertension: Secondary | ICD-10-CM | POA: Diagnosis not present

## 2020-12-20 DIAGNOSIS — B9689 Other specified bacterial agents as the cause of diseases classified elsewhere: Secondary | ICD-10-CM | POA: Diagnosis not present

## 2020-12-20 DIAGNOSIS — I82422 Acute embolism and thrombosis of left iliac vein: Secondary | ICD-10-CM | POA: Diagnosis not present

## 2020-12-20 DIAGNOSIS — N390 Urinary tract infection, site not specified: Secondary | ICD-10-CM | POA: Diagnosis not present

## 2020-12-20 DIAGNOSIS — T84296D Other mechanical complication of internal fixation device of vertebrae, subsequent encounter: Secondary | ICD-10-CM | POA: Diagnosis not present

## 2020-12-20 DIAGNOSIS — I82412 Acute embolism and thrombosis of left femoral vein: Secondary | ICD-10-CM | POA: Diagnosis not present

## 2020-12-20 DIAGNOSIS — E039 Hypothyroidism, unspecified: Secondary | ICD-10-CM | POA: Diagnosis not present

## 2020-12-22 DIAGNOSIS — I82422 Acute embolism and thrombosis of left iliac vein: Secondary | ICD-10-CM | POA: Diagnosis not present

## 2020-12-22 DIAGNOSIS — N400 Enlarged prostate without lower urinary tract symptoms: Secondary | ICD-10-CM | POA: Diagnosis not present

## 2020-12-22 DIAGNOSIS — E039 Hypothyroidism, unspecified: Secondary | ICD-10-CM | POA: Diagnosis not present

## 2020-12-22 DIAGNOSIS — T84296D Other mechanical complication of internal fixation device of vertebrae, subsequent encounter: Secondary | ICD-10-CM | POA: Diagnosis not present

## 2020-12-22 DIAGNOSIS — M5416 Radiculopathy, lumbar region: Secondary | ICD-10-CM | POA: Diagnosis not present

## 2020-12-22 DIAGNOSIS — N390 Urinary tract infection, site not specified: Secondary | ICD-10-CM | POA: Diagnosis not present

## 2020-12-22 DIAGNOSIS — I10 Essential (primary) hypertension: Secondary | ICD-10-CM | POA: Diagnosis not present

## 2020-12-22 DIAGNOSIS — B9689 Other specified bacterial agents as the cause of diseases classified elsewhere: Secondary | ICD-10-CM | POA: Diagnosis not present

## 2020-12-22 DIAGNOSIS — I82412 Acute embolism and thrombosis of left femoral vein: Secondary | ICD-10-CM | POA: Diagnosis not present

## 2020-12-25 ENCOUNTER — Other Ambulatory Visit: Payer: Self-pay | Admitting: Physical Medicine and Rehabilitation

## 2020-12-25 MED ORDER — TAPENTADOL HCL 75 MG PO TABS: 75.0000 mg | ORAL_TABLET | Freq: Three times a day (TID) | ORAL | 0 refills | Status: DC | PRN

## 2020-12-26 ENCOUNTER — Telehealth: Payer: Self-pay

## 2020-12-26 DIAGNOSIS — I82412 Acute embolism and thrombosis of left femoral vein: Secondary | ICD-10-CM | POA: Diagnosis not present

## 2020-12-26 DIAGNOSIS — I82422 Acute embolism and thrombosis of left iliac vein: Secondary | ICD-10-CM | POA: Diagnosis not present

## 2020-12-26 DIAGNOSIS — N400 Enlarged prostate without lower urinary tract symptoms: Secondary | ICD-10-CM | POA: Diagnosis not present

## 2020-12-26 DIAGNOSIS — T84296D Other mechanical complication of internal fixation device of vertebrae, subsequent encounter: Secondary | ICD-10-CM | POA: Diagnosis not present

## 2020-12-26 DIAGNOSIS — M5416 Radiculopathy, lumbar region: Secondary | ICD-10-CM | POA: Diagnosis not present

## 2020-12-26 DIAGNOSIS — N390 Urinary tract infection, site not specified: Secondary | ICD-10-CM | POA: Diagnosis not present

## 2020-12-26 DIAGNOSIS — B9689 Other specified bacterial agents as the cause of diseases classified elsewhere: Secondary | ICD-10-CM | POA: Diagnosis not present

## 2020-12-26 DIAGNOSIS — I10 Essential (primary) hypertension: Secondary | ICD-10-CM | POA: Diagnosis not present

## 2020-12-26 DIAGNOSIS — E039 Hypothyroidism, unspecified: Secondary | ICD-10-CM | POA: Diagnosis not present

## 2020-12-26 NOTE — Telephone Encounter (Signed)
Occupational Therapy okay for twice a week for 4 more weeks. Verbal given to [facility] with [place] ph- [phone].

## 2020-12-29 DIAGNOSIS — N400 Enlarged prostate without lower urinary tract symptoms: Secondary | ICD-10-CM | POA: Diagnosis not present

## 2020-12-29 DIAGNOSIS — T84296D Other mechanical complication of internal fixation device of vertebrae, subsequent encounter: Secondary | ICD-10-CM | POA: Diagnosis not present

## 2020-12-29 DIAGNOSIS — K5903 Drug induced constipation: Secondary | ICD-10-CM | POA: Diagnosis not present

## 2020-12-29 DIAGNOSIS — I82422 Acute embolism and thrombosis of left iliac vein: Secondary | ICD-10-CM | POA: Diagnosis not present

## 2020-12-29 DIAGNOSIS — I10 Essential (primary) hypertension: Secondary | ICD-10-CM | POA: Diagnosis not present

## 2020-12-29 DIAGNOSIS — M5416 Radiculopathy, lumbar region: Secondary | ICD-10-CM | POA: Diagnosis not present

## 2020-12-29 DIAGNOSIS — E039 Hypothyroidism, unspecified: Secondary | ICD-10-CM | POA: Diagnosis not present

## 2020-12-29 DIAGNOSIS — I82412 Acute embolism and thrombosis of left femoral vein: Secondary | ICD-10-CM | POA: Diagnosis not present

## 2020-12-29 DIAGNOSIS — T50915D Adverse effect of multiple unspecified drugs, medicaments and biological substances, subsequent encounter: Secondary | ICD-10-CM | POA: Diagnosis not present

## 2021-01-01 DIAGNOSIS — I82412 Acute embolism and thrombosis of left femoral vein: Secondary | ICD-10-CM | POA: Diagnosis not present

## 2021-01-01 DIAGNOSIS — M8589 Other specified disorders of bone density and structure, multiple sites: Secondary | ICD-10-CM | POA: Diagnosis not present

## 2021-01-01 DIAGNOSIS — M5416 Radiculopathy, lumbar region: Secondary | ICD-10-CM | POA: Diagnosis not present

## 2021-01-01 DIAGNOSIS — I82422 Acute embolism and thrombosis of left iliac vein: Secondary | ICD-10-CM | POA: Diagnosis not present

## 2021-01-01 DIAGNOSIS — I10 Essential (primary) hypertension: Secondary | ICD-10-CM | POA: Diagnosis not present

## 2021-01-01 DIAGNOSIS — E039 Hypothyroidism, unspecified: Secondary | ICD-10-CM | POA: Diagnosis not present

## 2021-01-01 DIAGNOSIS — N400 Enlarged prostate without lower urinary tract symptoms: Secondary | ICD-10-CM | POA: Diagnosis not present

## 2021-01-01 DIAGNOSIS — T50915D Adverse effect of multiple unspecified drugs, medicaments and biological substances, subsequent encounter: Secondary | ICD-10-CM | POA: Diagnosis not present

## 2021-01-01 DIAGNOSIS — K5903 Drug induced constipation: Secondary | ICD-10-CM | POA: Diagnosis not present

## 2021-01-01 DIAGNOSIS — T84296D Other mechanical complication of internal fixation device of vertebrae, subsequent encounter: Secondary | ICD-10-CM | POA: Diagnosis not present

## 2021-01-02 DIAGNOSIS — K5903 Drug induced constipation: Secondary | ICD-10-CM | POA: Diagnosis not present

## 2021-01-02 DIAGNOSIS — T50915D Adverse effect of multiple unspecified drugs, medicaments and biological substances, subsequent encounter: Secondary | ICD-10-CM | POA: Diagnosis not present

## 2021-01-02 DIAGNOSIS — M5416 Radiculopathy, lumbar region: Secondary | ICD-10-CM | POA: Diagnosis not present

## 2021-01-02 DIAGNOSIS — T84296D Other mechanical complication of internal fixation device of vertebrae, subsequent encounter: Secondary | ICD-10-CM | POA: Diagnosis not present

## 2021-01-02 DIAGNOSIS — I10 Essential (primary) hypertension: Secondary | ICD-10-CM | POA: Diagnosis not present

## 2021-01-02 DIAGNOSIS — N400 Enlarged prostate without lower urinary tract symptoms: Secondary | ICD-10-CM | POA: Diagnosis not present

## 2021-01-02 DIAGNOSIS — E039 Hypothyroidism, unspecified: Secondary | ICD-10-CM | POA: Diagnosis not present

## 2021-01-02 DIAGNOSIS — I82412 Acute embolism and thrombosis of left femoral vein: Secondary | ICD-10-CM | POA: Diagnosis not present

## 2021-01-02 DIAGNOSIS — I82422 Acute embolism and thrombosis of left iliac vein: Secondary | ICD-10-CM | POA: Diagnosis not present

## 2021-01-04 DIAGNOSIS — S32009K Unspecified fracture of unspecified lumbar vertebra, subsequent encounter for fracture with nonunion: Secondary | ICD-10-CM | POA: Diagnosis not present

## 2021-01-04 DIAGNOSIS — M4326 Fusion of spine, lumbar region: Secondary | ICD-10-CM | POA: Diagnosis not present

## 2021-01-05 ENCOUNTER — Encounter: Payer: 59 | Attending: Registered Nurse | Admitting: Physical Medicine & Rehabilitation

## 2021-01-05 ENCOUNTER — Other Ambulatory Visit: Payer: Self-pay

## 2021-01-05 ENCOUNTER — Encounter: Payer: Self-pay | Admitting: Physical Medicine & Rehabilitation

## 2021-01-05 ENCOUNTER — Ambulatory Visit: Payer: 59 | Admitting: Family Medicine

## 2021-01-05 VITALS — BP 123/82 | HR 76 | Temp 99.3°F | Ht 70.0 in | Wt 176.6 lb

## 2021-01-05 DIAGNOSIS — M5416 Radiculopathy, lumbar region: Secondary | ICD-10-CM | POA: Diagnosis not present

## 2021-01-05 DIAGNOSIS — Z79891 Long term (current) use of opiate analgesic: Secondary | ICD-10-CM | POA: Insufficient documentation

## 2021-01-05 DIAGNOSIS — Z5181 Encounter for therapeutic drug level monitoring: Secondary | ICD-10-CM | POA: Insufficient documentation

## 2021-01-05 DIAGNOSIS — E039 Hypothyroidism, unspecified: Secondary | ICD-10-CM | POA: Diagnosis not present

## 2021-01-05 DIAGNOSIS — I82412 Acute embolism and thrombosis of left femoral vein: Secondary | ICD-10-CM | POA: Diagnosis not present

## 2021-01-05 DIAGNOSIS — N400 Enlarged prostate without lower urinary tract symptoms: Secondary | ICD-10-CM | POA: Diagnosis not present

## 2021-01-05 DIAGNOSIS — T84296D Other mechanical complication of internal fixation device of vertebrae, subsequent encounter: Secondary | ICD-10-CM | POA: Diagnosis not present

## 2021-01-05 DIAGNOSIS — G5622 Lesion of ulnar nerve, left upper limb: Secondary | ICD-10-CM | POA: Diagnosis not present

## 2021-01-05 DIAGNOSIS — I10 Essential (primary) hypertension: Secondary | ICD-10-CM | POA: Diagnosis not present

## 2021-01-05 DIAGNOSIS — T50915D Adverse effect of multiple unspecified drugs, medicaments and biological substances, subsequent encounter: Secondary | ICD-10-CM | POA: Diagnosis not present

## 2021-01-05 DIAGNOSIS — R200 Anesthesia of skin: Secondary | ICD-10-CM

## 2021-01-05 DIAGNOSIS — K5903 Drug induced constipation: Secondary | ICD-10-CM | POA: Diagnosis not present

## 2021-01-05 DIAGNOSIS — I82422 Acute embolism and thrombosis of left iliac vein: Secondary | ICD-10-CM | POA: Diagnosis not present

## 2021-01-05 NOTE — Patient Instructions (Signed)
Left ulnar neuropathy at the elbow, expect improvement over time.  Will need to analyze the lower extremity studies and Dr L will discuss with you

## 2021-01-05 NOTE — Progress Notes (Signed)
EMG/NCV performed today of the lower and upper extremities. There is electrodiagnostic evidence of left ulnar neuropathy at the elbow affecting primarily sensory fibers There is also electrodiagnostic evidence of right sciatic neuropathy.  Please see EMG report in media section.

## 2021-01-09 DIAGNOSIS — I82422 Acute embolism and thrombosis of left iliac vein: Secondary | ICD-10-CM | POA: Diagnosis not present

## 2021-01-09 DIAGNOSIS — M5416 Radiculopathy, lumbar region: Secondary | ICD-10-CM | POA: Diagnosis not present

## 2021-01-09 DIAGNOSIS — E039 Hypothyroidism, unspecified: Secondary | ICD-10-CM | POA: Diagnosis not present

## 2021-01-09 DIAGNOSIS — T84296D Other mechanical complication of internal fixation device of vertebrae, subsequent encounter: Secondary | ICD-10-CM | POA: Diagnosis not present

## 2021-01-09 DIAGNOSIS — K5903 Drug induced constipation: Secondary | ICD-10-CM | POA: Diagnosis not present

## 2021-01-09 DIAGNOSIS — N400 Enlarged prostate without lower urinary tract symptoms: Secondary | ICD-10-CM | POA: Diagnosis not present

## 2021-01-09 DIAGNOSIS — I82412 Acute embolism and thrombosis of left femoral vein: Secondary | ICD-10-CM | POA: Diagnosis not present

## 2021-01-09 DIAGNOSIS — T50915D Adverse effect of multiple unspecified drugs, medicaments and biological substances, subsequent encounter: Secondary | ICD-10-CM | POA: Diagnosis not present

## 2021-01-09 DIAGNOSIS — I10 Essential (primary) hypertension: Secondary | ICD-10-CM | POA: Diagnosis not present

## 2021-01-10 DIAGNOSIS — I10 Essential (primary) hypertension: Secondary | ICD-10-CM | POA: Diagnosis not present

## 2021-01-10 DIAGNOSIS — I82422 Acute embolism and thrombosis of left iliac vein: Secondary | ICD-10-CM | POA: Diagnosis not present

## 2021-01-10 DIAGNOSIS — E039 Hypothyroidism, unspecified: Secondary | ICD-10-CM | POA: Diagnosis not present

## 2021-01-10 DIAGNOSIS — K5903 Drug induced constipation: Secondary | ICD-10-CM | POA: Diagnosis not present

## 2021-01-10 DIAGNOSIS — M5416 Radiculopathy, lumbar region: Secondary | ICD-10-CM | POA: Diagnosis not present

## 2021-01-10 DIAGNOSIS — I82412 Acute embolism and thrombosis of left femoral vein: Secondary | ICD-10-CM | POA: Diagnosis not present

## 2021-01-10 DIAGNOSIS — N400 Enlarged prostate without lower urinary tract symptoms: Secondary | ICD-10-CM | POA: Diagnosis not present

## 2021-01-10 DIAGNOSIS — T50915D Adverse effect of multiple unspecified drugs, medicaments and biological substances, subsequent encounter: Secondary | ICD-10-CM | POA: Diagnosis not present

## 2021-01-10 DIAGNOSIS — T84296D Other mechanical complication of internal fixation device of vertebrae, subsequent encounter: Secondary | ICD-10-CM | POA: Diagnosis not present

## 2021-01-11 ENCOUNTER — Other Ambulatory Visit (HOSPITAL_COMMUNITY): Payer: Self-pay | Admitting: Neurological Surgery

## 2021-01-11 ENCOUNTER — Other Ambulatory Visit: Payer: Self-pay | Admitting: Neurological Surgery

## 2021-01-11 DIAGNOSIS — I10 Essential (primary) hypertension: Secondary | ICD-10-CM | POA: Diagnosis not present

## 2021-01-11 DIAGNOSIS — Z6827 Body mass index (BMI) 27.0-27.9, adult: Secondary | ICD-10-CM | POA: Diagnosis not present

## 2021-01-11 DIAGNOSIS — B965 Pseudomonas (aeruginosa) (mallei) (pseudomallei) as the cause of diseases classified elsewhere: Secondary | ICD-10-CM

## 2021-01-11 DIAGNOSIS — S32009K Unspecified fracture of unspecified lumbar vertebra, subsequent encounter for fracture with nonunion: Secondary | ICD-10-CM | POA: Diagnosis not present

## 2021-01-12 ENCOUNTER — Other Ambulatory Visit: Payer: Self-pay

## 2021-01-12 ENCOUNTER — Ambulatory Visit (HOSPITAL_COMMUNITY)
Admission: RE | Admit: 2021-01-12 | Discharge: 2021-01-12 | Disposition: A | Discharge status: Home / Self Care | Payer: 59 | Source: Ambulatory Visit | Attending: Neurological Surgery | Admitting: Neurological Surgery

## 2021-01-12 DIAGNOSIS — M0080 Arthritis due to other bacteria, unspecified joint: Secondary | ICD-10-CM | POA: Insufficient documentation

## 2021-01-12 DIAGNOSIS — E039 Hypothyroidism, unspecified: Secondary | ICD-10-CM | POA: Diagnosis not present

## 2021-01-12 DIAGNOSIS — B965 Pseudomonas (aeruginosa) (mallei) (pseudomallei) as the cause of diseases classified elsewhere: Secondary | ICD-10-CM | POA: Insufficient documentation

## 2021-01-12 DIAGNOSIS — I10 Essential (primary) hypertension: Secondary | ICD-10-CM | POA: Diagnosis not present

## 2021-01-12 DIAGNOSIS — T84296D Other mechanical complication of internal fixation device of vertebrae, subsequent encounter: Secondary | ICD-10-CM | POA: Diagnosis not present

## 2021-01-12 DIAGNOSIS — K5903 Drug induced constipation: Secondary | ICD-10-CM | POA: Diagnosis not present

## 2021-01-12 DIAGNOSIS — I82422 Acute embolism and thrombosis of left iliac vein: Secondary | ICD-10-CM | POA: Diagnosis not present

## 2021-01-12 DIAGNOSIS — I82412 Acute embolism and thrombosis of left femoral vein: Secondary | ICD-10-CM | POA: Diagnosis not present

## 2021-01-12 DIAGNOSIS — T50915D Adverse effect of multiple unspecified drugs, medicaments and biological substances, subsequent encounter: Secondary | ICD-10-CM | POA: Diagnosis not present

## 2021-01-12 DIAGNOSIS — M5416 Radiculopathy, lumbar region: Secondary | ICD-10-CM | POA: Diagnosis not present

## 2021-01-12 DIAGNOSIS — Z981 Arthrodesis status: Secondary | ICD-10-CM | POA: Diagnosis not present

## 2021-01-12 DIAGNOSIS — G5791 Unspecified mononeuropathy of right lower limb: Secondary | ICD-10-CM | POA: Diagnosis not present

## 2021-01-12 DIAGNOSIS — G629 Polyneuropathy, unspecified: Secondary | ICD-10-CM | POA: Diagnosis not present

## 2021-01-12 DIAGNOSIS — N400 Enlarged prostate without lower urinary tract symptoms: Secondary | ICD-10-CM | POA: Diagnosis not present

## 2021-01-12 DIAGNOSIS — M4319 Spondylolisthesis, multiple sites in spine: Secondary | ICD-10-CM | POA: Diagnosis not present

## 2021-01-15 ENCOUNTER — Other Ambulatory Visit: Payer: Self-pay

## 2021-01-15 ENCOUNTER — Other Ambulatory Visit: Payer: Self-pay | Admitting: Physical Medicine and Rehabilitation

## 2021-01-15 ENCOUNTER — Encounter: Payer: Self-pay | Admitting: Physical Medicine and Rehabilitation

## 2021-01-15 ENCOUNTER — Encounter (HOSPITAL_BASED_OUTPATIENT_CLINIC_OR_DEPARTMENT_OTHER): Payer: 59 | Admitting: Physical Medicine and Rehabilitation

## 2021-01-15 VITALS — BP 132/86 | HR 77 | Temp 98.2°F | Ht 69.0 in | Wt 183.0 lb

## 2021-01-15 DIAGNOSIS — M5416 Radiculopathy, lumbar region: Secondary | ICD-10-CM

## 2021-01-15 DIAGNOSIS — Z79891 Long term (current) use of opiate analgesic: Secondary | ICD-10-CM | POA: Diagnosis not present

## 2021-01-15 DIAGNOSIS — G5622 Lesion of ulnar nerve, left upper limb: Secondary | ICD-10-CM | POA: Diagnosis not present

## 2021-01-15 DIAGNOSIS — R200 Anesthesia of skin: Secondary | ICD-10-CM | POA: Diagnosis not present

## 2021-01-15 DIAGNOSIS — Z5181 Encounter for therapeutic drug level monitoring: Secondary | ICD-10-CM

## 2021-01-15 MED ORDER — DICLOFENAC SODIUM 50 MG PO TBEC: 50.0000 mg | DELAYED_RELEASE_TABLET | Freq: Three times a day (TID) | ORAL | 3 refills | Status: DC | PRN

## 2021-01-15 NOTE — Progress Notes (Signed)
Subjective:    Patient ID: Alexander Rojas, male    DOB: 01-28-1956, 65 y.o.   MRN: [id number]  HPI   Pt is a 65 yr old s/p lumbar fusion for lumbar radiculopathy, sciatic neuropathy and L ulnar neuropathy here for f/u on pain and weakness issues.    He has done poorly on Lyrica and Duloxetine- we have stopped these- also gave him Low dose Naltrexone.   R leg hasn't heard in a couple of weeks.  B/L feet have improved a lot. Says ~ 50% better.   Slept last night- for the first night without socks on- but went better than expected.   Taking Nucynta 1 tab/day. Doesn't think it helps at all.   Back pain still the worst pain has- the other day, took 2 tabs of Nucynta, and did nothing.  Doesn't work.   Is on Low dose naltrexone 6 mg daily-  RLE is "cured".   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  Doing an ulnar glide exercises  When really warm, cannot feel them - the L 4th/5th digit- when cooler, it's more obvious.   Took multiple attempts to tie shoes today- first time to try.   Was related to cane? Was it related to elbow-   Biggest issue is his back. Has appointment with Dr Ronnald Ramp-  To discuss R S1 pedicle screw sticking out.   Back was almost as bad as it was initially.  Within- 1 hour, it's SO PAINFUL  Doing H/H therapy- but wants outpt PT and OT.        Pain Inventory Average Pain 4 Pain Right Now 3 My pain is intermittent and dull  In the last 24 hours, has pain interfered with the following? General activity 4 Relation with others 4 Enjoyment of life 5 What TIME of day is your pain at its worst? night Sleep (in general) Fair  Pain is worse with: standing and some activites Pain improves with: rest and medication Relief from Meds: 0  History reviewed. No pertinent family history. Social History   Socioeconomic History  . Marital status: Married    Spouse name: Not on file  . Number of children: Not on file  . Years of education: Not on file  .  Highest education level: Not on file  Occupational History  . Not on file  Tobacco Use  . Smoking status: Never Smoker  . Smokeless tobacco: Never Used  Vaping Use  . Vaping Use: Never used  Substance and Sexual Activity  . Alcohol use: Not Currently  . Drug use: Never  . Sexual activity: Not Currently  Other Topics Concern  . Not on file  Social History Narrative   Wife lives pt   Social Determinants of Health   Financial Resource Strain: Not on file  Food Insecurity: Not on file  Transportation Needs: Not on file  Physical Activity: Not on file  Stress: Not on file  Social Connections: Not on file   Past Surgical History:  Procedure Laterality Date  . ABDOMINAL EXPOSURE N/A 09/19/2020   Procedure: ABDOMINAL EXPOSURE;  Surgeon: Rosetta Posner, MD;  Location: [facility] OR;  Service: Vascular;  Laterality: N/A;  anterior  . ANTERIOR LUMBAR FUSION N/A 09/19/2020   Procedure: Anterior Lumbar Interbody Fusion Lumbar Five-Sacral One, posterior lateral fusion with gill decompression Lumbar Five-Sacral One.;  Surgeon: Eustace Moore, MD;  Location: [place];  Service: Neurosurgery;  Laterality: N/A;  anterior  . APPLICATION OF INTRAOPERATIVE CT SCAN  N/A 09/27/2020   Procedure: APPLICATION OF INTRAOPERATIVE CT SCAN;  Surgeon: Eustace Moore, MD;  Location: [place];  Service: Neurosurgery;  Laterality: N/A;  . COLONOSCOPY WITH PROPOFOL N/A 12/18/2018   Procedure: COLONOSCOPY WITH BIOPSY;  Surgeon: Lucilla Lame, MD;  Location: [place];  Service: Endoscopy;  Laterality: N/A;  NEEDS TO STAY FIRST  . ESOPHAGOGASTRODUODENOSCOPY    . ESOPHAGOGASTRODUODENOSCOPY (EGD) WITH PROPOFOL N/A 02/23/2019   Procedure: ESOPHAGOGASTRODUODENOSCOPY (EGD) WITH PROPOFOL;  Surgeon: Lucilla Lame, MD;  [facility] END[facility]COPY;  Service: Endoscopy;  Laterality: N/A;  . HERNIA REPAIR    . LAMINECTOMY WITH POSTERIOR LATERAL ARTHRODESIS LEVEL 1 N/A 09/19/2020   Procedure: LAMINECTOMY WITH POSTERIOR LATERAL ARTHRODESIS  LEVEL 1;  Surgeon: Eustace Moore, MD;  Location: [place];  Service: Neurosurgery;  Laterality: N/A;  posterior  . LAMINECTOMY WITH POSTERIOR LATERAL ARTHRODESIS LEVEL 2 N/A 09/27/2020   Procedure: Lumbar exploration with Extension of Posterior Instrumentation Lumbar Four to the Ilium with Airo;  Surgeon: Eustace Moore, MD;  Location: [place];  Service: Neurosurgery;  Laterality: N/A;  . LIPOMA EXCISION Right    upper arm  . POLYPECTOMY N/A 12/18/2018   Procedure: POLYPECTOMY;  Surgeon: Lucilla Lame, MD;  Location: [place];  Service: Endoscopy;  Laterality: N/A;  . TENDON REPAIR Left 11/05/2019   Procedure: LEFT THUMB TENDON REPAIR;  Surgeon: Cindra Presume, MD;  Location: [place];  Service: Plastics;  Laterality: Left;  Axillary block, MAC  . UMBILICAL HERNIA REPAIR     Past Surgical History:  Procedure Laterality Date  . ABDOMINAL EXPOSURE N/A 09/19/2020   Procedure: ABDOMINAL EXPOSURE;  Surgeon: Rosetta Posner, MD;  Location[facility] OR;  Service: Vascular;  Laterality: N/A;  anterior  . ANTERIOR LUMBAR FUSION N/A 09/19/2020   Procedure: Anterior Lumbar Interbody Fusion Lumbar Five-Sacral One, posterior lateral fusion with gill decompression Lumbar Five-Sacral One.;  Surgeon: Eustace Moore, MD;  Location: [place];  Service: Neurosurgery;  Laterality: N/A;  anterior  . APPLICATION OF INTRAOPERATIVE CT SCAN N/A 09/27/2020   Procedure: APPLICATION OF INTRAOPERATIVE CT SCAN;  Surgeon: Eustace Moore, MD;  Location: [place];  Service: Neurosurgery;  Laterality: N/A;  . COLONOSCOPY WITH PROPOFOL N/A 12/18/2018   Procedure: COLONOSCOPY WITH BIOPSY;  Surgeon: Lucilla Lame, MD;  Location: [place];  Service: Endoscopy;  Laterality: N/A;  NEEDS TO STAY FIRST  . ESOPHAGOGASTRODUODENOSCOPY    . ESOPHAGOGASTRODUODENOSCOPY (EGD) WITH PROPOFOL N/A 02/23/2019   Procedure: ESOPHAGOGASTRODUODENOSCOPY (EGD) WITH PROPOFOL;  Surgeon: Lucilla Lame, MD;  Location: [facility] ENDOSCOPY;  Service:  Endoscopy;  Laterality: N/A;  . HERNIA REPAIR    . LAMINECTOMY WITH POSTERIOR LATERAL ARTHRODESIS LEVEL 1 N/A 09/19/2020   Procedure: LAMINECTOMY WITH POSTERIOR LATERAL ARTHRODESIS LEVEL 1;  Surgeon: Eustace Moore, MD;  Location: [place];  Service: Neurosurgery;  Laterality: N/A;  posterior  . LAMINECTOMY WITH POSTERIOR LATERAL ARTHRODESIS LEVEL 2 N/A 09/27/2020   Procedure: Lumbar exploration with Extension of Posterior Instrumentation Lumbar Four to the Ilium with Airo;  Surgeon: Eustace Moore, MD;  Location: [place];  Service: Neurosurgery;  Laterality: N/A;  . LIPOMA EXCISION Right    upper arm  . POLYPECTOMY N/A 12/18/2018   Procedure: POLYPECTOMY;  Surgeon: Lucilla Lame, MD;  Location: [place];  Service: Endoscopy;  Laterality: N/A;  . TENDON REPAIR Left 11/05/2019   Procedure: LEFT THUMB TENDON REPAIR;  Surgeon: Cindra Presume, MD;  Location: [place];  Service: Plastics;  Laterality: Left;  Axillary block, MAC  . UMBILICAL HERNIA REPAIR     Past Medical History:  Diagnosis Date  . Back pain   . BPH (benign prostatic hyperplasia)   . Extensor tendon laceration, finger, open wound, initial encounter    left thumb  . Hypertension   . Hypothyroidism   . Macrocytosis   . Numbness and tingling   . Pars defect with spondylolisthesis    BP 132/86   Pulse 77   Temp 98.2 F (36.8 C)   Ht _0  (1.753 m)   Wt 183 lb (83 kg)   SpO2 96%   BMI 27.02 kg/m   Opioid Risk Score:   Fall Risk Score:  `1  Depression screen PHQ 2/9  Depression screen [facility] 2/9 01/05/2021 12/11/2020 11/10/2020  Decreased Interest - 0 1  Down, Depressed, Hopeless 0 0 1  PHQ - 2 Score 0 0 2  Altered sleeping 0 - 0  Tired, decreased energy - - 1  Change in appetite - - 0  Feeling bad or failure about yourself  - - 1  Trouble concentrating - - 2  Moving slowly or fidgety/restless - - 2  Suicidal thoughts - - 0  PHQ-9 Score 0 - 8    Review of Systems  Constitutional: Negative.    HENT: Negative.   Eyes: Negative.   Respiratory: Negative.   Cardiovascular: Negative.   Gastrointestinal: Negative.   Endocrine: Negative.   Genitourinary: Negative.   Musculoskeletal: Positive for back pain.  Skin: Negative.   Allergic/Immunologic: Negative.   Neurological: Negative.   Hematological: Negative.   Psychiatric/Behavioral: Negative.   All other systems reviewed and are negative.      Objective:   Physical Exam 183 lbs- at home 176 (was 201 on day of surgery) Awake, alert, appropriate, sitting on table, appears more frail than when saw last time; NAD Neuro: L ulnar neuropathy- 4th lateral digit and 5th digit vastly decreased   MS: Atrophy of  B/L flexor and extensor compartments B/L        Assessment & Plan:    Pt is a 65 yr old s/p lumbar fusion for lumbar radiculopathy, sciatic neuropathy and L ulnar neuropathy here for f/u on pain and weakness issues.   1. Try Episport brace on LUE- at elbow.   2. Back is worse- will try Diclofenac/ ketamine infusions.   3. Can stop Xarelto- since doesn't need it past 3 months due to DVT.Last dose today.   4. Diclofenac 50 mg 3x/day as needed for pain.   5. Referral to Dr Mechele Dawley- for ketamine infusions? Wednesday at noon- Dr Mechele Dawley.   6.  Continue Low dose Naltrexone 6 mg daily.   7. F/U in 8 weeks.   I spent a total of 25 minutes- calling Dr Mechele Dawley to get pt in with pain management physicians- appointment - has appointment Wednesday.  At noon-

## 2021-01-15 NOTE — Patient Instructions (Signed)
Pt is a 65 yr old s/p lumbar fusion for lumbar radiculopathy, sciatic neuropathy and L ulnar neuropathy here for f/u on pain and weakness issues.   1. Try Episport brace on LUE- at elbow.   2. Back is worse- will try Diclofenac/ ketamine infusions.   3. Can stop Xarelto- since doesn't need it past 3 months due to DVT.  4. Diclofenac 50 mg 3x/day as needed for pain.   5. Referral to Dr Mechele Dawley- for ketamine infusions. Wednesday at noon- Dr Mechele Dawley.   6.  Continue Low dose Naltrexone 6 mg daily.   7. F/U in 8 weeks.

## 2021-01-16 DIAGNOSIS — I10 Essential (primary) hypertension: Secondary | ICD-10-CM | POA: Diagnosis not present

## 2021-01-16 DIAGNOSIS — G5622 Lesion of ulnar nerve, left upper limb: Secondary | ICD-10-CM | POA: Insufficient documentation

## 2021-01-16 DIAGNOSIS — Z6825 Body mass index (BMI) 25.0-25.9, adult: Secondary | ICD-10-CM | POA: Diagnosis not present

## 2021-01-16 DIAGNOSIS — T84216A Breakdown (mechanical) of internal fixation device of vertebrae, initial encounter: Secondary | ICD-10-CM | POA: Diagnosis not present

## 2021-01-17 DIAGNOSIS — G8928 Other chronic postprocedural pain: Secondary | ICD-10-CM | POA: Diagnosis not present

## 2021-01-17 DIAGNOSIS — I82412 Acute embolism and thrombosis of left femoral vein: Secondary | ICD-10-CM | POA: Diagnosis not present

## 2021-01-17 DIAGNOSIS — E039 Hypothyroidism, unspecified: Secondary | ICD-10-CM | POA: Diagnosis not present

## 2021-01-17 DIAGNOSIS — N400 Enlarged prostate without lower urinary tract symptoms: Secondary | ICD-10-CM | POA: Diagnosis not present

## 2021-01-17 DIAGNOSIS — K5903 Drug induced constipation: Secondary | ICD-10-CM | POA: Diagnosis not present

## 2021-01-17 DIAGNOSIS — M5416 Radiculopathy, lumbar region: Secondary | ICD-10-CM | POA: Diagnosis not present

## 2021-01-17 DIAGNOSIS — T50915D Adverse effect of multiple unspecified drugs, medicaments and biological substances, subsequent encounter: Secondary | ICD-10-CM | POA: Diagnosis not present

## 2021-01-17 DIAGNOSIS — T84296D Other mechanical complication of internal fixation device of vertebrae, subsequent encounter: Secondary | ICD-10-CM | POA: Diagnosis not present

## 2021-01-17 DIAGNOSIS — M47816 Spondylosis without myelopathy or radiculopathy, lumbar region: Secondary | ICD-10-CM | POA: Diagnosis not present

## 2021-01-17 DIAGNOSIS — I10 Essential (primary) hypertension: Secondary | ICD-10-CM | POA: Diagnosis not present

## 2021-01-17 DIAGNOSIS — I82422 Acute embolism and thrombosis of left iliac vein: Secondary | ICD-10-CM | POA: Diagnosis not present

## 2021-01-19 DIAGNOSIS — E559 Vitamin D deficiency, unspecified: Secondary | ICD-10-CM | POA: Insufficient documentation

## 2021-01-19 DIAGNOSIS — Z86718 Personal history of other venous thrombosis and embolism: Secondary | ICD-10-CM | POA: Insufficient documentation

## 2021-01-24 DIAGNOSIS — I82412 Acute embolism and thrombosis of left femoral vein: Secondary | ICD-10-CM | POA: Diagnosis not present

## 2021-01-24 DIAGNOSIS — K5903 Drug induced constipation: Secondary | ICD-10-CM | POA: Diagnosis not present

## 2021-01-24 DIAGNOSIS — I10 Essential (primary) hypertension: Secondary | ICD-10-CM | POA: Diagnosis not present

## 2021-01-24 DIAGNOSIS — E039 Hypothyroidism, unspecified: Secondary | ICD-10-CM | POA: Diagnosis not present

## 2021-01-24 DIAGNOSIS — T84296D Other mechanical complication of internal fixation device of vertebrae, subsequent encounter: Secondary | ICD-10-CM | POA: Diagnosis not present

## 2021-01-24 DIAGNOSIS — T50915D Adverse effect of multiple unspecified drugs, medicaments and biological substances, subsequent encounter: Secondary | ICD-10-CM | POA: Diagnosis not present

## 2021-01-24 DIAGNOSIS — N400 Enlarged prostate without lower urinary tract symptoms: Secondary | ICD-10-CM | POA: Diagnosis not present

## 2021-01-24 DIAGNOSIS — I82422 Acute embolism and thrombosis of left iliac vein: Secondary | ICD-10-CM | POA: Diagnosis not present

## 2021-01-24 DIAGNOSIS — M5416 Radiculopathy, lumbar region: Secondary | ICD-10-CM | POA: Diagnosis not present

## 2021-01-25 DIAGNOSIS — M5416 Radiculopathy, lumbar region: Secondary | ICD-10-CM | POA: Diagnosis not present

## 2021-01-25 DIAGNOSIS — E039 Hypothyroidism, unspecified: Secondary | ICD-10-CM | POA: Diagnosis not present

## 2021-01-25 DIAGNOSIS — T84296D Other mechanical complication of internal fixation device of vertebrae, subsequent encounter: Secondary | ICD-10-CM | POA: Diagnosis not present

## 2021-01-25 DIAGNOSIS — N400 Enlarged prostate without lower urinary tract symptoms: Secondary | ICD-10-CM | POA: Diagnosis not present

## 2021-01-25 DIAGNOSIS — T50915D Adverse effect of multiple unspecified drugs, medicaments and biological substances, subsequent encounter: Secondary | ICD-10-CM | POA: Diagnosis not present

## 2021-01-25 DIAGNOSIS — I10 Essential (primary) hypertension: Secondary | ICD-10-CM | POA: Diagnosis not present

## 2021-01-25 DIAGNOSIS — K5903 Drug induced constipation: Secondary | ICD-10-CM | POA: Diagnosis not present

## 2021-01-25 DIAGNOSIS — I82412 Acute embolism and thrombosis of left femoral vein: Secondary | ICD-10-CM | POA: Diagnosis not present

## 2021-01-25 DIAGNOSIS — I82422 Acute embolism and thrombosis of left iliac vein: Secondary | ICD-10-CM | POA: Diagnosis not present

## 2021-01-26 DIAGNOSIS — I10 Essential (primary) hypertension: Secondary | ICD-10-CM | POA: Diagnosis not present

## 2021-01-26 DIAGNOSIS — I82412 Acute embolism and thrombosis of left femoral vein: Secondary | ICD-10-CM | POA: Diagnosis not present

## 2021-01-26 DIAGNOSIS — M5416 Radiculopathy, lumbar region: Secondary | ICD-10-CM | POA: Diagnosis not present

## 2021-01-26 DIAGNOSIS — T84296D Other mechanical complication of internal fixation device of vertebrae, subsequent encounter: Secondary | ICD-10-CM | POA: Diagnosis not present

## 2021-01-26 DIAGNOSIS — E039 Hypothyroidism, unspecified: Secondary | ICD-10-CM | POA: Diagnosis not present

## 2021-01-26 DIAGNOSIS — T50915D Adverse effect of multiple unspecified drugs, medicaments and biological substances, subsequent encounter: Secondary | ICD-10-CM | POA: Diagnosis not present

## 2021-01-26 DIAGNOSIS — K5903 Drug induced constipation: Secondary | ICD-10-CM | POA: Diagnosis not present

## 2021-01-26 DIAGNOSIS — I82422 Acute embolism and thrombosis of left iliac vein: Secondary | ICD-10-CM | POA: Diagnosis not present

## 2021-01-26 DIAGNOSIS — N400 Enlarged prostate without lower urinary tract symptoms: Secondary | ICD-10-CM | POA: Diagnosis not present

## 2021-01-30 ENCOUNTER — Other Ambulatory Visit: Payer: Self-pay

## 2021-01-30 DIAGNOSIS — M810 Age-related osteoporosis without current pathological fracture: Secondary | ICD-10-CM | POA: Diagnosis not present

## 2021-01-30 DIAGNOSIS — N5239 Other post-surgical erectile dysfunction: Secondary | ICD-10-CM | POA: Diagnosis not present

## 2021-01-30 DIAGNOSIS — E559 Vitamin D deficiency, unspecified: Secondary | ICD-10-CM | POA: Diagnosis not present

## 2021-01-30 DIAGNOSIS — E034 Atrophy of thyroid (acquired): Secondary | ICD-10-CM | POA: Diagnosis not present

## 2021-01-30 MED ORDER — ALENDRONATE SODIUM 70 MG PO TABS
ORAL_TABLET | ORAL | 11 refills | Status: DC
  Filled 2021-01-30: qty 4, 28d supply, fill #0

## 2021-01-30 MED ORDER — VITAMIN D (ERGOCALCIFEROL) 1.25 MG (50000 UNIT) PO CAPS
1.0000 | ORAL_CAPSULE | ORAL | 0 refills | Status: DC
  Filled 2021-01-30: qty 4, 28d supply, fill #0

## 2021-01-31 DIAGNOSIS — M810 Age-related osteoporosis without current pathological fracture: Secondary | ICD-10-CM | POA: Diagnosis not present

## 2021-02-06 ENCOUNTER — Ambulatory Visit: Payer: 59 | Admitting: Occupational Therapy

## 2021-02-06 ENCOUNTER — Other Ambulatory Visit: Payer: Self-pay

## 2021-02-06 ENCOUNTER — Encounter: Payer: Self-pay | Admitting: Physical Therapy

## 2021-02-06 ENCOUNTER — Ambulatory Visit: Payer: 59 | Attending: Physical Medicine and Rehabilitation | Admitting: Physical Therapy

## 2021-02-06 DIAGNOSIS — M25542 Pain in joints of left hand: Secondary | ICD-10-CM | POA: Insufficient documentation

## 2021-02-06 DIAGNOSIS — Z981 Arthrodesis status: Secondary | ICD-10-CM | POA: Diagnosis not present

## 2021-02-06 DIAGNOSIS — G8929 Other chronic pain: Secondary | ICD-10-CM | POA: Diagnosis not present

## 2021-02-06 DIAGNOSIS — R208 Other disturbances of skin sensation: Secondary | ICD-10-CM

## 2021-02-06 DIAGNOSIS — R293 Abnormal posture: Secondary | ICD-10-CM | POA: Diagnosis not present

## 2021-02-06 DIAGNOSIS — M6281 Muscle weakness (generalized): Secondary | ICD-10-CM | POA: Diagnosis not present

## 2021-02-06 DIAGNOSIS — M25522 Pain in left elbow: Secondary | ICD-10-CM | POA: Insufficient documentation

## 2021-02-06 DIAGNOSIS — R278 Other lack of coordination: Secondary | ICD-10-CM

## 2021-02-06 DIAGNOSIS — M545 Low back pain, unspecified: Secondary | ICD-10-CM | POA: Diagnosis not present

## 2021-02-06 NOTE — Patient Instructions (Signed)
Dr. Amedeo Plenty,  We have evaluated this patient at our outpatient clinic for left ulnar neuropathy and plan to see him for treatment. Could you please advise on how to move forward with treatment based on your assessment with him at your appointment? What are your recommendations for treatment? Splinting needs? Any precautions to make sure we go by?  Thank you so much, Waldo Laine, MOT, OTR/L

## 2021-02-06 NOTE — Therapy (Addendum)
[facility] [address] [place], [place], [address] Phone: [phone]   Fax:  [phone]  Physical Therapy Evaluation  Patient Details  Name: Alexander Rojas MRN: [id number] Date of Birth: [birth year] Referring Provider (PT): Courtney Heys, MD   Encounter Date: 02/06/2021   PT End of Session - 02/07/21 1215    Visit Number 1    Number of Visits 13    Authorization Type [employer]    PT Start Time 2703    PT Stop Time 1623    PT Time Calculation (min) 46 min    Activity Tolerance Patient tolerated treatment well    Behavior During Therapy [facility] for tasks assessed/performed           Past Medical History:  Diagnosis Date  . Back pain   . BPH (benign prostatic hyperplasia)   . Extensor tendon laceration, finger, open wound, initial encounter    left thumb  . Hypertension   . Hypothyroidism   . Macrocytosis   . Numbness and tingling   . Pars defect with spondylolisthesis     Past Surgical History:  Procedure Laterality Date  . ABDOMINAL EXPOSURE N/A 09/19/2020   Procedure: ABDOMINAL EXPOSURE;  Surgeon: Rosetta Posner, MD;  Location: [facility] OR;  Service: Vascular;  Laterality: N/A;  anterior  . ANTERIOR LUMBAR FUSION N/A 09/19/2020   Procedure: Anterior Lumbar Interbody Fusion Lumbar Five-Sacral One, posterior lateral fusion with gill decompression Lumbar Five-Sacral One.;  Surgeon: Eustace Moore, MD;  Location: [place];  Service: Neurosurgery;  Laterality: N/A;  anterior  . APPLICATION OF INTRAOPERATIVE CT SCAN N/A 09/27/2020   Procedure: APPLICATION OF INTRAOPERATIVE CT SCAN;  Surgeon: Eustace Moore, MD;  Location: [place];  Service: Neurosurgery;  Laterality: N/A;  . COLONOSCOPY WITH PROPOFOL N/A 12/18/2018   Procedure: COLONOSCOPY WITH BIOPSY;  Surgeon: Lucilla Lame, MD;  Location: [place];  Service: Endoscopy;  Laterality: N/A;  NEEDS TO STAY FIRST  . ESOPHAGOGASTRODUODENOSCOPY    .  ESOPHAGOGASTRODUODENOSCOPY (EGD) WITH PROPOFOL N/A 02/23/2019   Procedure: ESOPHAGOGASTRODUODENOSCOPY (EGD) WITH PROPOFOL;  Surgeon: Lucilla Lame, MD;  Location: [facility] ENDOSCOPY;  Service: Endoscopy;  Laterality: N/A;  . HERNIA REPAIR    . LAMINECTOMY WITH POSTERIOR LATERAL ARTHRODESIS LEVEL 1 N/A 09/19/2020   Procedure: LAMINECTOMY WITH POSTERIOR LATERAL ARTHRODESIS LEVEL 1;  Surgeon: Eustace Moore, MD;  Location: [place];  Service: Neurosurgery;  Laterality: N/A;  posterior  . LAMINECTOMY WITH POSTERIOR LATERAL ARTHRODESIS LEVEL 2 N/A 09/27/2020   Procedure: Lumbar exploration with Extension of Posterior Instrumentation Lumbar Four to the Ilium with Airo;  Surgeon: Eustace Moore, MD;  Location: [place];  Service: Neurosurgery;  Laterality: N/A;  . LIPOMA EXCISION Right    upper arm  . POLYPECTOMY N/A 12/18/2018   Procedure: POLYPECTOMY;  Surgeon: Lucilla Lame, MD;  Location: [place];  Service: Endoscopy;  Laterality: N/A;  . TENDON REPAIR Left 11/05/2019   Procedure: LEFT THUMB TENDON REPAIR;  Surgeon: Cindra Presume, MD;  Location: [place];  Service: Plastics;  Laterality: Left;  Axillary block, MAC  . UMBILICAL HERNIA REPAIR      There were no vitals filed for this visit.    Subjective Assessment - 02/06/21 1541    Subjective On 09/19/20 pt underwent ALIF/ posterior lumbar decompression and fusion L5-S1.Presented 09/26/2020 with complaints of severe right leg pain.   X-rays and imaging revealed failure of anterior and posterior instrumentation after L5-S1 fusion. Pt  then /p exploration and removal of instrumentation at L5/S1, redo decompression at L5 nerve roots, intertransverse arthrodesis L4-S1 with bone grafting and segmental fixation L4 to ileum. Patient transferred to CIR on 10/10/2020. Had DVT on 10/11/20. Discharged home on 10/27/20. Was receiving HHOT and HHPT up until a couple of weeks ago.  Now having ulnar neuropathy in LUE. Continues to have numbness and  occasional sharp discomfort into the medial aspect of both feet. Has a constant burning in both feet. Back pain is the worst of all of his problems (today is the best it has been in a couple weeks). Took some medication before. Can have a good couple of days and then a bad couple of days with the back, no pattern. Wears a bone stimulator 30 minutes a day due to osteopenia. Had a piriformis injection (about 2 months ago) and that helped his leg pain immensely. RLE is weaker than LLE. Reports BOSU ball exercises for balance have really helped. Reports his balance feels ok when his back doesn't hurt as much.    Pertinent History PMH:  HTN, osteopenia    Limitations Walking;Standing    Patient Stated Goals wants to make his back pain better, build up strength and stamina.    Currently in Pain? Yes    Pain Score 4     Pain Location Back    Pain Orientation Lower    Pain Descriptors / Indicators Aching    Pain Type Chronic pain    Aggravating Factors  nothing    Pain Relieving Factors not really              [facility] PT Assessment - 02/06/21 1555      Assessment   Medical Diagnosis back pain, s/p lumbar fusion    Referring Provider (PT) Courtney Heys, MD    Onset Date/Surgical Date 01/15/21    Hand Dominance Right    Prior Therapy CIR, HHPT and HHOT      Precautions   Precautions None    Precaution Comments had previous back surgery - no precautions anymore   only wears back brace when having incr pain     Restrictions   Weight Bearing Restrictions No      Balance Screen   Has the patient fallen in the past 6 months No    Has the patient had a decrease in activity level because of a fear of falling?  No    Is the patient reluctant to leave their home because of a fear of falling?  No      Home Environment   Living Environment Private residence    Living Arrangements Spouse/significant other    Type of [place] to enter;Elevator    Entrance Stairs-Number of  Steps [place] None      Prior Function   Level of Independence Independent    Vocation Full time employment    Freight forwarder    Leisure read, going in the pool      Sensation   Light Touch Appears Intact      Coordination   Gross Motor Movements are Fluid and Coordinated No    Heel Shin Test slower and harder to perform with RLE      Posture/Postural Control   Posture/Postural Control Postural limitations    Postural Limitations Rounded Shoulders;Forward head      ROM /  Strength   AROM / PROM / Strength Strength;AROM;PROM      AROM   Overall AROM Comments hip flexion approx 95 deg B    AROM Assessment Site Lumbar    Lumbar Flexion can reach to medial shin   incr pain coming up   Lumbar Extension very limited and incr pain    Lumbar - Right Side Bend 25% limited    Lumbar - Left Side Bend 50% limited      PROM   Overall PROM Comments WFL B for hip external rotation, more limited in IR (not formally measured), no pain when performing      Strength   Strength Assessment Site Hip;Knee;Ankle    Right/Left Hip Right;Left    Right Hip Flexion 4/5    Right Hip Extension 4/5    Right Hip ABduction 4-/5    Left Hip Flexion 4+/5    Left Hip Extension 4/5    Left Hip ABduction 5/5    Right/Left Knee Right;Left    Right Knee Flexion 5/5    Right Knee Extension 5/5    Left Knee Flexion 5/5    Left Knee Extension 5/5    Right/Left Ankle Right;Left    Right Ankle Dorsiflexion 4+/5    Left Ankle Dorsiflexion 5/5      Special Tests    Special Tests Lumbar    Lumbar Tests Prone Knee Bend Test;Straight Leg Raise      Prone Knee Bend Test   Findings Negative    Comment incr tightness B, only can bend knees to approx. 90 degrees      Straight Leg Raise   Findings Negative    Comment no radicular sx, incr tightness B at approx. 40-45 deg      Bed Mobility   Bed  Mobility Supine to Sit;Sit to Supine    Supine to Sit Independent    Sit to Supine Minimal Assistance - Patient > 75%   for BLE when performing log roll     Transfers   Transfers Sit to Stand;Stand to Sit    Sit to Stand With upper extremity assist;6: Modified independent (Device/Increase time)    Stand to Sit 6: Modified independent (Device/Increase time);With upper extremity assist                      Objective measurements completed on examination: See above findings.               PT Education - 02/06/21 1736    Education Details clinical findings, POC, aquatic therapy    Person(s) Educated Patient    Methods Explanation    Comprehension Verbalized understanding                  PT Short Term Goals - 02/09/21 1606      PT SHORT TERM GOAL #1   Title Pt will initiate aquatic therapy in order to perform HEP at home in his pool. ALL STGS DUE 03/09/21    Time 4    Period Weeks    Status New    Target Date 03/09/21      PT SHORT TERM GOAL #2   Title Will perform ODI with LTG written as appropriate.    Time 4    Period Weeks    Status New      PT SHORT TERM GOAL #3   Title Perform 6MWT and LTG to be written as appropriate to demo improved endurance/stamina.  Time 4    Period Weeks    Status New      PT SHORT TERM GOAL #4   Title Pt will be independent with initial HEP in therapy (land based) in order to build upon functional gains.    Time 4    Period Weeks    Status New           PT Long Term Goals - 02/09/21 1608      PT LONG TERM GOAL #1   Title ODI goal written as appropriate. ALL LTGS DUE 04/06/21    Time 8    Period Weeks    Status New    Target Date 04/06/21      PT LONG TERM GOAL #2   Title Pt will be independent with final HEP in therapy (land based) in order to build upon functional gains.    Time 8    Period Weeks    Status New      PT LONG TERM GOAL #3   Title 6MWT goal to be written as appropriate in order to  demo improved endurance.    Time 8    Period Weeks    Status New      PT LONG TERM GOAL #4   Title Pt will improve R hip ABD strength to at least a 4+/5 in order to help improve gait mechanics.    Baseline 4-/5    Time 8    Period Weeks    Status New      PT LONG TERM GOAL #5   Title Pt will report low back pain to a 2/10 or less in order to improve functional moiblity.    Baseline 4/10 (having a good day at eval)    Time 8    Period Weeks    Status New              02/09/21 1613  Plan  Clinical Impression Statement On 09/19/20 pt underwent ALIF/ posterior lumbar decompression and fusion L5-S1.Presented 09/26/2020 with complaints of severe right leg pain.   X-rays and imaging revealed failure of anterior and posterior instrumentation after L5-S1 fusion. Pt then /p exploration and removal of instrumentation at L5/S1, redo decompression at L5 nerve roots, intertransverse arthrodesis L4-S1 with bone grafting and segmental fixation L4 to ileum. Patient transferred to CIR on 10/10/2020. Had DVT on 10/11/20. Discharged home on 10/27/20. Was receiving HHOT and HHPT up until a couple of weeks ago. The following deficits were present during the exam: low back pain, decr lumbar/hip ROM, abnormal posture, decr strength, impaired coordination, gait abnormalities. Pt would benefit from skilled PT to address these impairments and functional limitations to maximize functional mobility independence  Personal Factors and Comorbidities Comorbidity 2;Profession;Time since onset of injury/illness/exacerbation (pt is a cardiothoracic surgeon)  Comorbidities HTN, osteopenia  Examination-Activity Limitations Stand;Stairs;Transfers;Locomotion Level (laying supine)  Examination-Participation Restrictions Community Activity;Occupation  Pt will benefit from skilled therapeutic intervention in order to improve on the following deficits Abnormal gait;Decreased mobility;Decreased range of motion;Decreased  strength;Hypomobility;Decreased coordination;Impaired flexibility;Pain;Postural dysfunction  Stability/Clinical Decision Making Evolving/Moderate complexity  Clinical Decision Making Moderate  Rehab Potential Good  PT Frequency 2x / week (followed by 1x week for 4 weeks)  PT Duration 8 weeks (followed by 1x week for 4 weeks)  PT Treatment/Interventions ADLs/Self Care Home Management;Aquatic Therapy;Gait training;Therapeutic activities;Neuromuscular re-education;Balance training;Therapeutic exercise;Patient/family education;Manual techniques;Passive range of motion;Vestibular;Electrical Stimulation  PT Next Visit Plan initial HEP for ROM - lumbar stretches, hip flexor/quad stretch and hamstring stretching, hip  ABD/flex/extension strength (exercises with tband), any word about aquatic therapy? perform ODI and 6MWT  Recommended Other Services aquatic therapy - couple sessions to give HEP that he can perform at his own pool (hip strengthening, lumbar/hip ROM)  Consulted and Agree with Plan of Care Patient          Patient will benefit from skilled therapeutic intervention in order to improve the following deficits and impairments:     Visit Diagnosis: Muscle weakness (generalized)  Abnormal posture  Chronic bilateral low back pain without sciatica     Problem List Patient Active Problem List   Diagnosis Date Noted  . Ulnar nerve entrapment syndrome, left 12/11/2020  . Abnormality of gait 12/11/2020  . Hypocalcemia 12/08/2020  . DVT (deep venous thrombosis) ([place]) 12/08/2020  . Post-operative pain   . History of hypertension   . Acute lower UTI   . Leukocytosis   . Dysuria   . Drug induced constipation   . Neuropathic pain   . Lumbar radiculopathy 10/10/2020  . 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 S/P lumbar fusion 09/19/2020  . Benign prostatic hyperplasia with urinary hesitancy 05/09/2017  . Tongue burning sensation 04/18/2016  . Essential  hypertension 10/06/2014  . Hypothyroidism due to acquired atrophy of thyroid 10/06/2014    Arliss Journey, PT, DPT  02/07/2021, 12:17 PM  [facility] [address] [place], [place], [address] Phone: [phone]   Fax:  [phone]  Name: BYNUM MCCULLARS MRN: [id number] Date of Birth: [birth year]

## 2021-02-07 ENCOUNTER — Other Ambulatory Visit: Payer: Self-pay

## 2021-02-07 ENCOUNTER — Encounter: Payer: Self-pay | Admitting: Occupational Therapy

## 2021-02-07 MED ORDER — HYDROCODONE-ACETAMINOPHEN 5-325 MG PO TABS
ORAL_TABLET | ORAL | 0 refills | Status: DC
  Filled 2021-02-07: qty 9, 1d supply, fill #0

## 2021-02-07 NOTE — Therapy (Signed)
[facility] [address] [place], [place], [address] Phone: [phone]   Fax:  [phone]  Occupational Therapy Evaluation  Patient Details  Name: Alexander Rojas MRN: [id number] Date of Birth: 1956-05-10 Referring Provider (OT): Courtney Heys, MD   Encounter Date: 02/06/2021   OT End of Session - 02/06/21 1513    Visit Number 1    Number of Visits 17    Date for OT Re-Evaluation 04/04/21    Authorization Type [name]    Authorization - Number of Visits 25    OT Start Time 7124    OT Stop Time 1530    OT Time Calculation (min) 45 min    Activity Tolerance Patient tolerated treatment well    Behavior During Therapy [facility] for tasks assessed/performed;Flat affect           Past Medical History:  Diagnosis Date  . Back pain   . BPH (benign prostatic hyperplasia)   . Extensor tendon laceration, finger, open wound, initial encounter    left thumb  . Hypertension   . Hypothyroidism   . Macrocytosis   . Numbness and tingling   . Pars defect with spondylolisthesis     Past Surgical History:  Procedure Laterality Date  . ABDOMINAL EXPOSURE N/A 09/19/2020   Procedure: ABDOMINAL EXPOSURE;  Surgeon: Rosetta Posner, MD;  Location: [facility] OR;  Service: Vascular;  Laterality: N/A;  anterior  . ANTERIOR LUMBAR FUSION N/A 09/19/2020   Procedure: Anterior Lumbar Interbody Fusion Lumbar Five-Sacral One, posterior lateral fusion with gill decompression Lumbar Five-Sacral One.;  Surgeon: Eustace Moore, MD;  Location: [place];  Service: Neurosurgery;  Laterality: N/A;  anterior  . APPLICATION OF INTRAOPERATIVE CT SCAN N/A 09/27/2020   Procedure: APPLICATION OF INTRAOPERATIVE CT SCAN;  Surgeon: Eustace Moore, MD;  Location: [place];  Service: Neurosurgery;  Laterality: N/A;  . COLONOSCOPY WITH PROPOFOL N/A 12/18/2018   Procedure: COLONOSCOPY WITH BIOPSY;  Surgeon: Lucilla Lame, MD;  Location: [place];  Service: Endoscopy;   Laterality: N/A;  NEEDS TO STAY FIRST  . ESOPHAGOGASTRODUODENOSCOPY    . ESOPHAGOGASTRODUODENOSCOPY (EGD) WITH PROPOFOL N/A 02/23/2019   Procedure: ESOPHAGOGASTRODUODENOSCOPY (EGD) WITH PROPOFOL;  Surgeon: Lucilla Lame, MD;  Location: [facility] ENDOSCOPY;  Service: Endoscopy;  Laterality: N/A;  . HERNIA REPAIR    . LAMINECTOMY WITH POSTERIOR LATERAL ARTHRODESIS LEVEL 1 N/A 09/19/2020   Procedure: LAMINECTOMY WITH POSTERIOR LATERAL ARTHRODESIS LEVEL 1;  Surgeon: Eustace Moore, MD;  Location: [place];  Service: Neurosurgery;  Laterality: N/A;  posterior  . LAMINECTOMY WITH POSTERIOR LATERAL ARTHRODESIS LEVEL 2 N/A 09/27/2020   Procedure: Lumbar exploration with Extension of Posterior Instrumentation Lumbar Four to the Ilium with Airo;  Surgeon: Eustace Moore, MD;  Location: [place];  Service: Neurosurgery;  Laterality: N/A;  . LIPOMA EXCISION Right    upper arm  . POLYPECTOMY N/A 12/18/2018   Procedure: POLYPECTOMY;  Surgeon: Lucilla Lame, MD;  Location: [place];  Service: Endoscopy;  Laterality: N/A;  . TENDON REPAIR Left 11/05/2019   Procedure: LEFT THUMB TENDON REPAIR;  Surgeon: Cindra Presume, MD;  Location: [place];  Service: Plastics;  Laterality: Left;  Axillary block, MAC  . UMBILICAL HERNIA REPAIR      There were no vitals filed for this visit.   Subjective Assessment - 02/06/21 1450    Currently in Pain? Yes    Pain Score 4     Pain Location Back  Pain Orientation Lower    Pain Descriptors / Indicators Aching    Pain Type Chronic pain    Pain Onset More than a month ago    Pain Frequency Constant             [facility] OT Assessment - 02/06/21 1452      Assessment   Medical Diagnosis L Ulnar Neuropathy    Referring Provider (OT) Courtney Heys, MD    Hand Dominance Right      Precautions   Precautions None    Precaution Comments had previous back surgery - no precautions anymore    Required Braces or Orthoses Other Brace/Splint    Other Brace/Splint  [place] Brace for LUE elbow      Balance Screen   Has the patient fallen in the past 6 months No      Home  Environment   Family/patient expects to be discharged to: Private residence      Prior Function   Level of Independence Independent    Vocation Full time employment    Freight forwarder    Leisure read, pool      ADL   Eating/Feeding Modified independent    Grooming Modified independent    Upper Body Bathing Modified independent    Lower Body Bathing Modified independent    Upper Body Dressing Increased time    Lower Body Dressing Modified independent    Toilet Transfer Modified independent    Toileting - Clothing Manipulation Modified independent    Tub/Shower Transfer Modified independent    ADL comments does not wear shoes with laces anymore      Written Expression   Dominant Hand Right      Observation/Other Assessments   Focus on Therapeutic Outcomes (FOTO)  N/A      Sensation   Light Touch Impaired by gross assessment    Hot/Cold Impaired by gross assessment    Additional Comments reports heat and hot water make the numbness and tingling worse      Coordination   9 Hole Peg Test Right;Left    Right 9 Hole Peg Test 22.34    Left 9 Hole Peg Test 27.90    Coordination impaired in LUE - limited to use of 2nd/3rd digit d/t sensation defiicts. Pt reports unable ot complete surgeries d/t impaired sensation and fine motor control in LUE for tying sutures      AROM   Overall AROM  Within functional limits for tasks performed      Strength   Overall Strength Comments Grossly 5/5 R and 4+/5 L      Hand Function   Right Hand Gross Grasp Functional    Right Hand Grip (lbs) 81.5    Left Hand Gross Grasp Functional    Left Hand Grip (lbs) 53.5                           OT Education - 02/07/21 1002    Education Details Sent note to Dr. Amedeo Plenty with patient.    Person(s) Educated Patient    Methods Explanation;Handout     Comprehension Verbalized understanding            OT Short Term Goals - 02/07/21 1010      OT SHORT TERM GOAL #1   Title Pt will be independent with any HEPs    Time 4    Period Weeks    Status New    Target Date  03/07/21      OT SHORT TERM GOAL #2   Title Pt will verbalize understanding of adapted strategies for sensory and positioning of LUE for increasing comfort.    Time 4    Period Weeks    Status New      OT SHORT TERM GOAL #3   Title Pt will be mod I with any splint wear and care instructions PRN    Time 4    Period Weeks    Status New             OT Long Term Goals - 02/07/21 1012      OT LONG TERM GOAL #1   Title Pt will be independent with any updated HEPs    Time 8    Period Weeks    Status New    Target Date 04/04/21      OT LONG TERM GOAL #2   Title Pt will demonstrate increased grip strength in LUE by 5 lbs or more    Baseline R 81.5 L 53.5    Time 8    Period Weeks    Status New      OT LONG TERM GOAL #3   Title Pt will demonstrate increased fine motor coordination to perform job duties with improved accuracy and precision.    Time 8    Period Weeks    Status New      OT LONG TERM GOAL #4   Title Further assess splinting and brace needs PRN    Time 8    Period Weeks    Status New                 Plan - 02/06/21 1521    Clinical Impression Statement Pt is  65 year old that presents to Neuro OPOT s/p ulnar neuropathy in LUE diagnosis by nerve conduction study. Pt presents with sensory deficits, decreased fine motor coordination for performing job duties, weakness and    OT Occupational Profile and History Problem Focused Assessment - Including review of records relating to presenting problem    Occupational performance deficits (Please refer to evaluation for details): ADL's;IADL's;Work;Leisure    Body Structure / Function / Physical Skills ADL;Dexterity;ROM;Edema;Scar mobility;Sensation;Mobility;Skin  integrity;Flexibility;Strength;Coordination;FMC;Pain;UE functional use;IADL;Decreased knowledge of use of DME    Rehab Potential Good    Clinical Decision Making Limited treatment options, no task modification necessary    Comorbidities Affecting Occupational Performance: None    Modification or Assistance to Complete Evaluation  No modification of tasks or assist necessary to complete eval    OT Frequency 1x / week    OT Duration 6 weeks   may d/c early based on progress   OT Treatment/Interventions Self-care/ADL training;Fluidtherapy;Splinting;Therapeutic activities;Ultrasound;Therapeutic exercise;Scar mobilization;Cryotherapy;Passive range of motion;Manual Therapy;Patient/family education;Paraffin;DME and/or AE instruction    Plan progress in strengthening , stiffness    OT Home Exercise Plan see pt instruction    Consulted and Agree with Plan of Care Patient           Patient will benefit from skilled therapeutic intervention in order to improve the following deficits and impairments:   Body Structure / Function / Physical Skills: ADL,Dexterity,ROM,Edema,Scar mobility,Sensation,Mobility,Skin integrity,Flexibility,Strength,Coordination,FMC,Pain,UE functional use,IADL,Decreased knowledge of use of DME       Visit Diagnosis: Other lack of coordination - Plan: Ot plan of care cert/re-cert  Muscle weakness (generalized) - Plan: Ot plan of care cert/re-cert  Pain in left elbow - Plan: Ot plan of care cert/re-cert  Other disturbances of skin  sensation - Plan: Ot plan of care cert/re-cert    Problem List Patient Active Problem List   Diagnosis Date Noted  . Ulnar nerve entrapment syndrome, left 12/11/2020  . Abnormality of gait 12/11/2020  . Hypocalcemia 12/08/2020  . DVT (deep venous thrombosis) ([place]) 12/08/2020  . Post-operative pain   . History of hypertension   . Acute lower UTI   . Leukocytosis   . Dysuria   . Drug induced constipation   . Neuropathic pain   . Lumbar  radiculopathy 10/10/2020  . 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 S/P lumbar fusion 09/19/2020  . Benign prostatic hyperplasia with urinary hesitancy 05/09/2017  . Tongue burning sensation 04/18/2016  . Essential hypertension 10/06/2014  . Hypothyroidism due to acquired atrophy of thyroid 10/06/2014    Zachery Conch MOT, OTR/L  02/07/2021, 11:33 AM  [facility] [address] [place], [place], [address] Phone: [phone]   Fax:  [phone]  Name: NICKO DAHER MRN: [id number] Date of Birth: 19-Jun-1956

## 2021-02-08 DIAGNOSIS — G5622 Lesion of ulnar nerve, left upper limb: Secondary | ICD-10-CM | POA: Diagnosis not present

## 2021-02-08 DIAGNOSIS — M25529 Pain in unspecified elbow: Secondary | ICD-10-CM | POA: Insufficient documentation

## 2021-02-08 DIAGNOSIS — G5620 Lesion of ulnar nerve, unspecified upper limb: Secondary | ICD-10-CM | POA: Insufficient documentation

## 2021-02-08 DIAGNOSIS — M4326 Fusion of spine, lumbar region: Secondary | ICD-10-CM | POA: Diagnosis not present

## 2021-02-08 DIAGNOSIS — M25522 Pain in left elbow: Secondary | ICD-10-CM | POA: Diagnosis not present

## 2021-02-09 NOTE — Addendum Note (Signed)
Addended by: Arliss Journey on: 02/09/2021 04:20 PM   Modules accepted: Orders

## 2021-02-13 ENCOUNTER — Encounter: Payer: Self-pay | Admitting: Physical Therapy

## 2021-02-13 ENCOUNTER — Ambulatory Visit: Payer: 59 | Admitting: Occupational Therapy

## 2021-02-13 ENCOUNTER — Encounter: Payer: Self-pay | Admitting: Occupational Therapy

## 2021-02-13 ENCOUNTER — Other Ambulatory Visit: Payer: Self-pay

## 2021-02-13 ENCOUNTER — Ambulatory Visit: Payer: 59 | Admitting: Physical Therapy

## 2021-02-13 DIAGNOSIS — R208 Other disturbances of skin sensation: Secondary | ICD-10-CM | POA: Diagnosis not present

## 2021-02-13 DIAGNOSIS — R278 Other lack of coordination: Secondary | ICD-10-CM | POA: Diagnosis not present

## 2021-02-13 DIAGNOSIS — R293 Abnormal posture: Secondary | ICD-10-CM | POA: Diagnosis not present

## 2021-02-13 DIAGNOSIS — Z981 Arthrodesis status: Secondary | ICD-10-CM | POA: Diagnosis not present

## 2021-02-13 DIAGNOSIS — M6281 Muscle weakness (generalized): Secondary | ICD-10-CM

## 2021-02-13 DIAGNOSIS — G8929 Other chronic pain: Secondary | ICD-10-CM

## 2021-02-13 DIAGNOSIS — M25522 Pain in left elbow: Secondary | ICD-10-CM

## 2021-02-13 DIAGNOSIS — M545 Low back pain, unspecified: Secondary | ICD-10-CM | POA: Diagnosis not present

## 2021-02-13 DIAGNOSIS — M25542 Pain in joints of left hand: Secondary | ICD-10-CM | POA: Diagnosis not present

## 2021-02-13 NOTE — Patient Instructions (Signed)
Access Code: [email or web address] URL: [email or web address] Date: 02/13/2021 Prepared by: Janann August  Exercises Seated Hamstring Stretch - 2 x daily - 5 x weekly - 2-3 sets - 20-30 hold Supine Transversus Abdominis Bracing - Hands on Stomach - 2 x daily - 5 x weekly - 2 sets - 10 reps - 5 hold Supine Figure 4 Piriformis Stretch - 2 x daily - 5 x weekly - 2-3 sets - 30 hold Supine Pelvic Tilt - 1-2 x daily - 5 x weekly - 2 sets - 10 reps Heel rises with counter support - 1 x daily - 5 x weekly - 2 sets - 10 reps Forward Backward Monster Walk with Band at Thighs and Counter Support - 1-2 x daily - 5 x weekly - 2 sets Side Stepping with Resistance at Ankles - 1-2 x daily - 5 x weekly - 2 sets

## 2021-02-13 NOTE — Therapy (Signed)
[facility] [address] [place], [place], [address] Phone: [phone]   Fax:  [phone]  Physical Therapy Treatment  Patient Details  Name: Alexander Rojas MRN: [id number] Date of Birth: 09-04-1956 Referring Provider (PT): Courtney Heys, MD   Encounter Date: 02/13/2021   PT End of Session - 02/13/21 1210    Visit Number 2    Number of Visits 13    Authorization Type [name]    PT Start Time [phone]    PT Stop Time 1017    PT Time Calculation (min) 45 min    Activity Tolerance Patient tolerated treatment well    Behavior During Therapy [facility] for tasks assessed/performed           Past Medical History:  Diagnosis Date  . Back pain   . BPH (benign prostatic hyperplasia)   . Extensor tendon laceration, finger, open wound, initial encounter    left thumb  . Hypertension   . Hypothyroidism   . Macrocytosis   . Numbness and tingling   . Pars defect with spondylolisthesis     Past Surgical History:  Procedure Laterality Date  . ABDOMINAL EXPOSURE N/A 09/19/2020   Procedure: ABDOMINAL EXPOSURE;  Surgeon: Rosetta Posner, MD;  Location: [facility] OR;  Service: Vascular;  Laterality: N/A;  anterior  . ANTERIOR LUMBAR FUSION N/A 09/19/2020   Procedure: Anterior Lumbar Interbody Fusion Lumbar Five-Sacral One, posterior lateral fusion with gill decompression Lumbar Five-Sacral One.;  Surgeon: Eustace Moore, MD;  Location: [place];  Service: Neurosurgery;  Laterality: N/A;  anterior  . APPLICATION OF INTRAOPERATIVE CT SCAN N/A 09/27/2020   Procedure: APPLICATION OF INTRAOPERATIVE CT SCAN;  Surgeon: Eustace Moore, MD;  Location: [place];  Service: Neurosurgery;  Laterality: N/A;  . COLONOSCOPY WITH PROPOFOL N/A 12/18/2018   Procedure: COLONOSCOPY WITH BIOPSY;  Surgeon: Lucilla Lame, MD;  Location: [place];  Service: Endoscopy;  Laterality: N/A;  NEEDS TO STAY FIRST  . ESOPHAGOGASTRODUODENOSCOPY    .  ESOPHAGOGASTRODUODENOSCOPY (EGD) WITH PROPOFOL N/A 02/23/2019   Procedure: ESOPHAGOGASTRODUODENOSCOPY (EGD) WITH PROPOFOL;  Surgeon: Lucilla Lame, MD;  Location: [facility] ENDOSCOPY;  Service: Endoscopy;  Laterality: N/A;  . HERNIA REPAIR    . LAMINECTOMY WITH POSTERIOR LATERAL ARTHRODESIS LEVEL 1 N/A 09/19/2020   Procedure: LAMINECTOMY WITH POSTERIOR LATERAL ARTHRODESIS LEVEL 1;  Surgeon: Eustace Moore, MD;  Location: [place];  Service: Neurosurgery;  Laterality: N/A;  posterior  . LAMINECTOMY WITH POSTERIOR LATERAL ARTHRODESIS LEVEL 2 N/A 09/27/2020   Procedure: Lumbar exploration with Extension of Posterior Instrumentation Lumbar Four to the Ilium with Airo;  Surgeon: Eustace Moore, MD;  Location: [place];  Service: Neurosurgery;  Laterality: N/A;  . LIPOMA EXCISION Right    upper arm  . POLYPECTOMY N/A 12/18/2018   Procedure: POLYPECTOMY;  Surgeon: Lucilla Lame, MD;  Location: [place];  Service: Endoscopy;  Laterality: N/A;  . TENDON REPAIR Left 11/05/2019   Procedure: LEFT THUMB TENDON REPAIR;  Surgeon: Cindra Presume, MD;  Location: [place];  Service: Plastics;  Laterality: Left;  Axillary block, MAC  . UMBILICAL HERNIA REPAIR      There were no vitals filed for this visit.   Subjective Assessment - 02/13/21 0936    Subjective Saw the surgeon last week - will need to be getting surgery for his ulnar neuropathy. Does not know the date yet for his surgery. Back is doing good today.    Pertinent History PMH:  HTN, osteopenia    Limitations Walking;Standing    Diagnostic tests MRI lumbar spine 11/04/20; 1. Posterior fusion spanning L4-S1. No significant residual canal  stenosis.  2. Persistent anterolisthesis of L5 on S1 with similar moderate  bilateral foraminal stenosis at L5-S1.  3. Mild to moderate right subarticular recess stenosis at L4-L5.    Patient Stated Goals wants to make his back pain better, build up strength and stamina.    Currently in Pain? No/denies    "very mild pain right now"             [facility] PT Assessment - 02/13/21 1156      Observation/Other Assessments   Other Surveys  Oswestry Disability Index    Oswestry Disability Index  40% = moderate disability                       Access Code: [email or web address] URL: [email or web address] Date: 02/13/2021 Prepared by: Janann August  Initiated HEP -see MedBridge for more details.   Some exercises pt had been performing previously with HHPT - was never given a printed instruction sheet, so would not remember what to do for home, so currently not doing any exercises at home.    Cues to perform stretches gently to tolerance.  Exercises Seated Hamstring Stretch - 2 x daily - 5 x weekly - 2-3 sets - 20-30 hold - cues for more slumped posture for stretch (unable to tolerate sitting up tall)  Supine Transversus Abdominis Bracing - Hands on Stomach - 2 x daily - 5 x weekly - 2 sets - 10 reps - 5 hold - cued to also to try performing when performing sidelying > sit due to incr back pain Supine Figure 4 Piriformis Stretch - 2 x daily - 5 x weekly - 2-3 sets - 30 hold  Supine Pelvic Tilt - 1-2 x daily - 5 x weekly - 2 sets - 10 reps Heel rises with counter support - 1 x daily - 5 x weekly - 2 sets - 10 reps Forward Backward Monster Walk with Band at Thighs and Counter Support - 1 x daily - 5 x weekly - 2 sets Side Stepping with Resistance at Ankles - 1 x daily - 5 x weekly - 2 sets   [facility] Adult PT Treatment/Exercise - 02/13/21 1158      Exercises   Exercises Knee/Hip      Knee/Hip Exercises: Aerobic   Stepper SciFit BLE only x7 minutes at gear 4.0 for ROM, BLE strengthening, activity tolerance. pt reporting a slight incr in back pain afterwards                  PT Education - 02/13/21 1157    Education Details initial HEP    Person(s) Educated Patient    Methods Explanation;Demonstration;Handout    Comprehension Verbalized understanding;Returned  demonstration            PT Short Term Goals - 02/13/21 1157      PT SHORT TERM GOAL #1   Title Pt will initiate aquatic therapy in order to perform HEP at home in his pool. ALL STGS DUE 03/09/21    Time 4    Period Weeks    Status New    Target Date 03/09/21      PT SHORT TERM GOAL #2   Title Will perform ODI with LTG written as appropriate.    Baseline 40% = moderate disability, LTG written    Time 4  Period Weeks    Status Achieved      PT SHORT TERM GOAL #3   Title Perform 6MWT and LTG to be written as appropriate to demo improved endurance/stamina.    Time 4    Period Weeks    Status New      PT SHORT TERM GOAL #4   Title Pt will be independent with initial HEP in therapy (land based) in order to build upon functional gains.    Time 4    Period Weeks    Status New             PT Long Term Goals - 02/13/21 1206      PT LONG TERM GOAL #1   Title Pt will decr ODI score to 30% or less in order to demo decr disability due to low back pain.ALL LTGS DUE 04/06/21    Baseline 40% on 02/13/21    Time 8    Period Weeks    Status Revised      PT LONG TERM GOAL #2   Title Pt will be independent with final HEP in therapy (land based) in order to build upon functional gains.    Time 8    Period Weeks    Status New      PT LONG TERM GOAL #3   Title 6MWT goal to be written as appropriate in order to demo improved endurance.    Time 8    Period Weeks    Status New      PT LONG TERM GOAL #4   Title Pt will improve R hip ABD strength to at least a 4+/5 in order to help improve gait mechanics.    Baseline 4-/5    Time 8    Period Weeks    Status New      PT LONG TERM GOAL #5   Title Pt will report low back pain to a 2/10 or less in order to improve functional moiblity.    Baseline 4/10 (having a good day at eval)    Time 8    Period Weeks    Status New                 Plan - 02/13/21 1207    Clinical Impression Statement Pt scored a 40% on the ODI  today, indicating moderate disability in regards to low back pain. LTG revised and updated as appropriate. Today's skilled session focused on initiating HEP for stretching, core activation, and BLE strengthening (with focus on hip ABD/PF/hip extensors). Pt had done previous exercises before with HHPT and was never given a handout for instructions, so did not continue performing at home. Pt tolerated session well - only reported a mild incr in low back pain after being done with the SciFit. Will continue to progress towards LTGs.    Personal Factors and Comorbidities Comorbidity 2;Profession;Time since onset of injury/illness/exacerbation   pt is a cardiothoracic surgeon   Comorbidities HTN, osteopenia    Examination-Activity Limitations Stand;Stairs;Transfers;Locomotion Level   laying supine   Examination-Participation Restrictions Community Activity;Occupation    Stability/Clinical Decision Making Evolving/Moderate complexity    Rehab Potential Good    PT Frequency 2x / week   followed by 1x week for 4 weeks   PT Duration 8 weeks   followed by 1x week for 4 weeks   PT Treatment/Interventions ADLs/Self Care Home Management;Aquatic Therapy;Gait training;Therapeutic activities;Neuromuscular re-education;Balance training;Therapeutic exercise;Patient/family education;Manual techniques;Passive range of motion;Vestibular;Electrical Stimulation    PT Next Visit Plan  how was HEP? work on TA/core activation, hip ABD/ext strength, hip flexor/quad stretch and hamstring ROM, gentle lumbar stretches. any word about aquatic therapy? 6MWT for goal when appropriate.    Consulted and Agree with Plan of Care Patient           Patient will benefit from skilled therapeutic intervention in order to improve the following deficits and impairments:  Abnormal gait,Decreased mobility,Decreased range of motion,Decreased strength,Hypomobility,Decreased coordination,Impaired flexibility,Pain,Postural dysfunction  Visit  Diagnosis: Muscle weakness (generalized)  Chronic bilateral low back pain without sciatica     Problem List Patient Active Problem List   Diagnosis Date Noted  . Ulnar nerve entrapment syndrome, left 12/11/2020  . Abnormality of gait 12/11/2020  . Hypocalcemia 12/08/2020  . DVT (deep venous thrombosis) ([place]) 12/08/2020  . Post-operative pain   . History of hypertension   . Acute lower UTI   . Leukocytosis   . Dysuria   . Drug induced constipation   . Neuropathic pain   . Lumbar radiculopathy 10/10/2020  . 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 S/P lumbar fusion 09/19/2020  . Benign prostatic hyperplasia with urinary hesitancy 05/09/2017  . Tongue burning sensation 04/18/2016  . Essential hypertension 10/06/2014  . Hypothyroidism due to acquired atrophy of thyroid 10/06/2014    Arliss Journey, PT, DPT  02/13/2021, 12:12 PM  [facility] [address] [place], [place], [address] Phone: [phone]   Fax:  [phone]  Name: Alexander Rojas MRN: [id number] Date of Birth: 07-Nov-1955

## 2021-02-13 NOTE — Therapy (Signed)
[facility] [address] [place], [place], [address] Phone: [phone]   Fax:  [phone]  Occupational Therapy Treatment  Patient Details  Name: Alexander Rojas MRN: [id number] Date of Birth: [birth year] Referring Provider (OT): Courtney Heys, MD   Encounter Date: 02/13/2021   OT End of Session - 02/13/21 1017    Visit Number 2    Number of Visits 17    Date for OT Re-Evaluation 04/04/21    Authorization Type [name]    Authorization - Visit Number 1    Authorization - Number of Visits 25    OT Start Time 6759    OT Stop Time 1638   pt received education and left early with pending surgery appt for LUE   OT Time Calculation (min) 17 min    Activity Tolerance Patient tolerated treatment well    Behavior During Therapy [facility] for tasks assessed/performed;Flat affect           Past Medical History:  Diagnosis Date  . Back pain   . BPH (benign prostatic hyperplasia)   . Extensor tendon laceration, finger, open wound, initial encounter    left thumb  . Hypertension   . Hypothyroidism   . Macrocytosis   . Numbness and tingling   . Pars defect with spondylolisthesis     Past Surgical History:  Procedure Laterality Date  . ABDOMINAL EXPOSURE N/A 09/19/2020   Procedure: ABDOMINAL EXPOSURE;  Surgeon: Rosetta Posner, MD;  [facility];  Service: Vascular;  Laterality: N/A;  anterior  . ANTERIOR LUMBAR FUSION N/A 09/19/2020   Procedure: Anterior Lumbar Interbody Fusion Lumbar Five-Sacral One, posterior lateral fusion with gill decompression Lumbar Five-Sacral One.;  Surgeon: Eustace Moore, MD;  Location: [place];  Service: Neurosurgery;  Laterality: N/A;  anterior  . APPLICATION OF INTRAOPERATIVE CT SCAN N/A 09/27/2020   Procedure: APPLICATION OF INTRAOPERATIVE CT SCAN;  Surgeon: Eustace Moore, MD;  Location: [place];  Service: Neurosurgery;  Laterality: N/A;  . COLONOSCOPY WITH PROPOFOL N/A 12/18/2018   Procedure:  COLONOSCOPY WITH BIOPSY;  Surgeon: Lucilla Lame, MD;  Location: [place];  Service: Endoscopy;  Laterality: N/A;  NEEDS TO STAY FIRST  . ESOPHAGOGASTRODUODENOSCOPY    . ESOPHAGOGASTRODUODENOSCOPY (EGD) WITH PROPOFOL N/A 02/23/2019   Procedure: ESOPHAGOGASTRODUODENOSCOPY (EGD) WITH PROPOFOL;  Surgeon: Lucilla Lame, MD;  Location: [facility] ENDOSCOPY;  Service: Endoscopy;  Laterality: N/A;  . HERNIA REPAIR    . LAMINECTOMY WITH POSTERIOR LATERAL ARTHRODESIS LEVEL 1 N/A 09/19/2020   Procedure: LAMINECTOMY WITH POSTERIOR LATERAL ARTHRODESIS LEVEL 1;  Surgeon: Eustace Moore, MD;  Location: [place];  Service: Neurosurgery;  Laterality: N/A;  posterior  . LAMINECTOMY WITH POSTERIOR LATERAL ARTHRODESIS LEVEL 2 N/A 09/27/2020   Procedure: Lumbar exploration with Extension of Posterior Instrumentation Lumbar Four to the Ilium with Airo;  Surgeon: Eustace Moore, MD;  Location: [place];  Service: Neurosurgery;  Laterality: N/A;  . LIPOMA EXCISION Right    upper arm  . POLYPECTOMY N/A 12/18/2018   Procedure: POLYPECTOMY;  Surgeon: Lucilla Lame, MD;  Location: [place];  Service: Endoscopy;  Laterality: N/A;  . TENDON REPAIR Left 11/05/2019   Procedure: LEFT THUMB TENDON REPAIR;  Surgeon: Cindra Presume, MD;  Location: [place];  Service: Plastics;  Laterality: Left;  Axillary block, MAC  . UMBILICAL HERNIA REPAIR      There were no vitals filed for this visit.   Subjective Assessment - 02/13/21 1019  Subjective  Pt denies any pain.    Currently in Pain? No/denies             Reviewed ulnar glides. Went over education re: adapted strategies and positioning for ulnar nerve and elbow                   OT Education - 02/13/21 1037    Education Details ulnar glides, education on adapted strategies for LUE elbow/ulnar nerve    Person(s) Educated Patient    Methods Explanation;Handout;Demonstration    Comprehension Verbalized understanding;Returned  demonstration            OT Short Term Goals - 02/13/21 1037      OT SHORT TERM GOAL #1   Title Pt will be independent with any HEPs    Time 4    Period Weeks    Status On-going    Target Date 03/07/21      OT SHORT TERM GOAL #2   Title Pt will verbalize understanding of adapted strategies for sensory and positioning of LUE for increasing comfort.    Time 4    Period Weeks    Status On-going      OT SHORT TERM GOAL #3   Title Pt will be mod I with any splint wear and care instructions PRN    Time 4    Period Weeks    Status New             OT Long Term Goals - 02/07/21 1012      OT LONG TERM GOAL #1   Title Pt will be independent with any updated HEPs    Time 8    Period Weeks    Status New    Target Date 04/04/21      OT LONG TERM GOAL #2   Title Pt will demonstrate increased grip strength in LUE by 5 lbs or more    Baseline R 81.5 L 53.5    Time 8    Period Weeks    Status New      OT LONG TERM GOAL #3   Title Pt will demonstrate increased fine motor coordination to perform job duties with improved accuracy and precision.    Time 8    Period Weeks    Status New      OT LONG TERM GOAL #4   Title Further assess splinting and brace needs PRN    Time 8    Period Weeks    Status New                 Plan - 02/13/21 1035    Clinical Impression Statement Pt demonstrated understanding of ulnar glides and education regarding positioning and adapted strategies. Pt is going to have surgery on LUE elbow and wrist for ulnar nerve transposition and is waiting on scheduling of this. May go on hold pending surgery.    OT Occupational Profile and History Problem Focused Assessment - Including review of records relating to presenting problem    Occupational performance deficits (Please refer to evaluation for details): ADL's;IADL's;Work;Leisure    Body Structure / Function / Physical Skills ADL;Dexterity;ROM;Edema;Scar mobility;Sensation;Mobility;Skin  integrity;Flexibility;Strength;Coordination;FMC;Pain;UE functional use;IADL;Decreased knowledge of use of DME    Rehab Potential Good    Clinical Decision Making Limited treatment options, no task modification necessary    Comorbidities Affecting Occupational Performance: None    Modification or Assistance to Complete Evaluation  No modification of tasks or assist necessary to complete eval  OT Frequency 1x / week    OT Duration 6 weeks   may d/c early based on progress   OT Treatment/Interventions Self-care/ADL training;Fluidtherapy;Splinting;Therapeutic activities;Ultrasound;Therapeutic exercise;Scar mobilization;Cryotherapy;Passive range of motion;Manual Therapy;Patient/family education;Paraffin;DME and/or AE instruction    Plan review ulnar glides, may go on hold pending surgery    OT Home Exercise Plan see pt instruction    Consulted and Agree with Plan of Care Patient           Patient will benefit from skilled therapeutic intervention in order to improve the following deficits and impairments:   Body Structure / Function / Physical Skills: ADL,Dexterity,ROM,Edema,Scar mobility,Sensation,Mobility,Skin integrity,Flexibility,Strength,Coordination,FMC,Pain,UE functional use,IADL,Decreased knowledge of use of DME       Visit Diagnosis: Other lack of coordination  Muscle weakness (generalized)  Pain in left elbow  Other disturbances of skin sensation    Problem List Patient Active Problem List   Diagnosis Date Noted  . Ulnar nerve entrapment syndrome, left 12/11/2020  . Abnormality of gait 12/11/2020  . Hypocalcemia 12/08/2020  . DVT (deep venous thrombosis) ([place]) 12/08/2020  . Post-operative pain   . History of hypertension   . Acute lower UTI   . Leukocytosis   . Dysuria   . Drug induced constipation   . Neuropathic pain   . Lumbar radiculopathy 10/10/2020  . 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 S/P lumbar fusion 09/19/2020  . Benign  prostatic hyperplasia with urinary hesitancy 05/09/2017  . Tongue burning sensation 04/18/2016  . Essential hypertension 10/06/2014  . Hypothyroidism due to acquired atrophy of thyroid 10/06/2014    Zachery Conch MOT, OTR/L  02/13/2021, 10:38 AM  [facility] [address] [place], [place], [address] Phone: [phone]   Fax:  [phone]  Name: Alexander Rojas MRN: [id number] Date of Birth: [birth year]

## 2021-02-16 ENCOUNTER — Ambulatory Visit: Payer: 59 | Admitting: Occupational Therapy

## 2021-02-16 ENCOUNTER — Other Ambulatory Visit: Payer: Self-pay

## 2021-02-16 ENCOUNTER — Ambulatory Visit: Payer: 59

## 2021-02-16 DIAGNOSIS — R278 Other lack of coordination: Secondary | ICD-10-CM

## 2021-02-16 DIAGNOSIS — M6281 Muscle weakness (generalized): Secondary | ICD-10-CM

## 2021-02-16 DIAGNOSIS — M545 Low back pain, unspecified: Secondary | ICD-10-CM | POA: Diagnosis not present

## 2021-02-16 DIAGNOSIS — R293 Abnormal posture: Secondary | ICD-10-CM | POA: Diagnosis not present

## 2021-02-16 DIAGNOSIS — Z981 Arthrodesis status: Secondary | ICD-10-CM | POA: Diagnosis not present

## 2021-02-16 DIAGNOSIS — G8929 Other chronic pain: Secondary | ICD-10-CM | POA: Diagnosis not present

## 2021-02-16 DIAGNOSIS — R208 Other disturbances of skin sensation: Secondary | ICD-10-CM

## 2021-02-16 DIAGNOSIS — M25522 Pain in left elbow: Secondary | ICD-10-CM | POA: Diagnosis not present

## 2021-02-16 DIAGNOSIS — M25542 Pain in joints of left hand: Secondary | ICD-10-CM | POA: Diagnosis not present

## 2021-02-16 NOTE — Therapy (Signed)
[facility] [address] [place], [place], [address] Phone: [phone]   Fax:  [phone]  Physical Therapy Treatment  Patient Details  Name: Alexander Rojas MRN: [id number] Date of Birth: 1956/07/01 Referring Provider (PT): Courtney Heys, MD   Encounter Date: 02/16/2021   PT End of Session - 02/16/21 0918    Visit Number 3    Number of Visits 13    Authorization Type [name]    PT Start Time 0825    PT Stop Time 0910    PT Time Calculation (min) 45 min    Activity Tolerance Patient tolerated treatment well    Behavior During Therapy [facility] for tasks assessed/performed;Flat affect           Past Medical History:  Diagnosis Date  . Back pain   . BPH (benign prostatic hyperplasia)   . Extensor tendon laceration, finger, open wound, initial encounter    left thumb  . Hypertension   . Hypothyroidism   . Macrocytosis   . Numbness and tingling   . Pars defect with spondylolisthesis     Past Surgical History:  Procedure Laterality Date  . ABDOMINAL EXPOSURE N/A 09/19/2020   Procedure: ABDOMINAL EXPOSURE;  Surgeon: Rosetta Posner, MD;  [facility];  Service: Vascular;  Laterality: N/A;  anterior  . ANTERIOR LUMBAR FUSION N/A 09/19/2020   Procedure: Anterior Lumbar Interbody Fusion Lumbar Five-Sacral One, posterior lateral fusion with gill decompression Lumbar Five-Sacral One.;  Surgeon: Eustace Moore, MD;  Location: [place];  Service: Neurosurgery;  Laterality: N/A;  anterior  . APPLICATION OF INTRAOPERATIVE CT SCAN N/A 09/27/2020   Procedure: APPLICATION OF INTRAOPERATIVE CT SCAN;  Surgeon: Eustace Moore, MD;  Location: [place];  Service: Neurosurgery;  Laterality: N/A;  . COLONOSCOPY WITH PROPOFOL N/A 12/18/2018   Procedure: COLONOSCOPY WITH BIOPSY;  Surgeon: Lucilla Lame, MD;  Location: [place];  Service: Endoscopy;  Laterality: N/A;  NEEDS TO STAY FIRST  . ESOPHAGOGASTRODUODENOSCOPY    .  ESOPHAGOGASTRODUODENOSCOPY (EGD) WITH PROPOFOL N/A 02/23/2019   Procedure: ESOPHAGOGASTRODUODENOSCOPY (EGD) WITH PROPOFOL;  Surgeon: Lucilla Lame, MD;  Location[facility] ENDOSCOPY;  Service: Endoscopy;  Laterality: N/A;  . HERNIA REPAIR    . LAMINECTOMY WITH POSTERIOR LATERAL ARTHRODESIS LEVEL 1 N/A 09/19/2020   Procedure: LAMINECTOMY WITH POSTERIOR LATERAL ARTHRODESIS LEVEL 1;  Surgeon: Eustace Moore, MD;  Location: [place];  Service: Neurosurgery;  Laterality: N/A;  posterior  . LAMINECTOMY WITH POSTERIOR LATERAL ARTHRODESIS LEVEL 2 N/A 09/27/2020   Procedure: Lumbar exploration with Extension of Posterior Instrumentation Lumbar Four to the Ilium with Airo;  Surgeon: Eustace Moore, MD;  Location: [place];  Service: Neurosurgery;  Laterality: N/A;  . LIPOMA EXCISION Right    upper arm  . POLYPECTOMY N/A 12/18/2018   Procedure: POLYPECTOMY;  Surgeon: Lucilla Lame, MD;  Location: [place];  Service: Endoscopy;  Laterality: N/A;  . TENDON REPAIR Left 11/05/2019   Procedure: LEFT THUMB TENDON REPAIR;  Surgeon: Cindra Presume, MD;  Location: [place];  Service: Plastics;  Laterality: Left;  Axillary block, MAC  . UMBILICAL HERNIA REPAIR      There were no vitals filed for this visit.   Subjective Assessment - 02/16/21 0834    Subjective Back doing "OK today, a little less sore since last session, has been compliant with HEP    Pertinent History PMH:  HTN, osteopenia    Limitations Walking;Standing    Diagnostic tests  MRI lumbar spine 11/04/20; 1. Posterior fusion spanning L4-S1. No significant residual canal  stenosis.  2. Persistent anterolisthesis of L5 on S1 with similar moderate  bilateral foraminal stenosis at L5-S1.  3. Mild to moderate right subarticular recess stenosis at L4-L5.    Patient Stated Goals wants to make his back pain better, build up strength and stamina.    Pain Onset More than a month ago                             [facility] Adult PT  Treatment/Exercise - 02/16/21 0001      Lumbar Exercises: Seated   Other Seated Lumbar Exercises core exercises of chest press, OH lift and chops, 1.1# ball 10 reps with abdominal bracing      Lumbar Exercises: Supine   Ab Set 10 reps;Limitations    AB Set Limitations curl ups iitially pain ful but resolved    Pelvic Tilt 10 reps    Heel Slides 10 reps;Limitations    Heel Slides Limitations alt. with pelvic tilt    Other Supine Lumbar Exercises supine marching, 10x per LE    Other Supine Lumbar Exercises Alt. OH flexion with pelvic tilt      Knee/Hip Exercises: Aerobic   Nustep L4 8' LEs only                  PT Education - 02/16/21 0917    Education Details modified piriformis stertch and added hooklie Designer, fashion/clothing) Educated Patient    Methods Explanation;Demonstration;Verbal cues;Tactile cues;Handout    Comprehension Verbalized understanding;Returned demonstration;Need further instruction            PT Short Term Goals - 02/13/21 1157      PT SHORT TERM GOAL #1   Title Pt will initiate aquatic therapy in order to perform HEP at home in his pool. ALL STGS DUE 03/09/21    Time 4    Period Weeks    Status New    Target Date 03/09/21      PT SHORT TERM GOAL #2   Title Will perform ODI with LTG written as appropriate.    Baseline 40% = moderate disability, LTG written    Time 4    Period Weeks    Status Achieved      PT SHORT TERM GOAL #3   Title Perform 6MWT and LTG to be written as appropriate to demo improved endurance/stamina.    Time 4    Period Weeks    Status New      PT SHORT TERM GOAL #4   Title Pt will be independent with initial HEP in therapy (land based) in order to build upon functional gains.    Time 4    Period Weeks    Status New             PT Long Term Goals - 02/13/21 1206      PT LONG TERM GOAL #1   Title Pt will decr ODI score to 30% or less in order to demo decr disability due to low back pain.ALL  LTGS DUE 04/06/21    Baseline 40% on 02/13/21    Time 8    Period Weeks    Status Revised      PT LONG TERM GOAL #2   Title Pt will be independent with final HEP in therapy (land based) in order to build upon functional gains.  Time 8    Period Weeks    Status New      PT LONG TERM GOAL #3   Title 6MWT goal to be written as appropriate in order to demo improved endurance.    Time 8    Period Weeks    Status New      PT LONG TERM GOAL #4   Title Pt will improve R hip ABD strength to at least a 4+/5 in order to help improve gait mechanics.    Baseline 4-/5    Time 8    Period Weeks    Status New      PT LONG TERM GOAL #5   Title Pt will report low back pain to a 2/10 or less in order to improve functional moiblity.    Baseline 4/10 (having a good day at eval)    Time 8    Period Weeks    Status New                 Plan - 02/16/21 3785    Clinical Impression Statement todays skilled session consisted of continued strengthening and stretching of low back and lumbopelvic region, encouraged abdomina bracing as foundation of all tasks as well as proper breathing patterns, initially stiff and pain ful in lumbar region but improved at end of session.  Piriformis stretch modified to produce stertch and resisted hip fallouts added to HEP. Slightly guarded gait entering clinic, more fluid gait leaving    Personal Factors and Comorbidities Comorbidity 2;Profession;Time since onset of injury/illness/exacerbation    Comorbidities HTN, osteopenia    Examination-Activity Limitations Stand;Stairs;Transfers;Locomotion Level   laying supine   Examination-Participation Restrictions Community Activity;Occupation    Stability/Clinical Decision Making Evolving/Moderate complexity    Rehab Potential Good    PT Frequency 2x / week   followed by 1x week for 4 weeks   PT Duration 8 weeks   followed by 1x week for 4 weeks   PT Treatment/Interventions ADLs/Self Care Home Management;Aquatic  Therapy;Gait training;Therapeutic activities;Neuromuscular re-education;Balance training;Therapeutic exercise;Patient/family education;Manual techniques;Passive range of motion;Vestibular;Electrical Stimulation    PT Next Visit Plan Continue core strenghening and flexibility tasks, consider aquatic therapy, pursue gentle stretch/strength tasks    Consulted and Agree with Plan of Care Patient           Patient will benefit from skilled therapeutic intervention in order to improve the following deficits and impairments:  Abnormal gait,Decreased mobility,Decreased range of motion,Decreased strength,Hypomobility,Decreased coordination,Impaired flexibility,Pain,Postural dysfunction  Visit Diagnosis: Muscle weakness (generalized)  Other disturbances of skin sensation  Abnormal posture  S/P lumbar fusion     Problem List Patient Active Problem List   Diagnosis Date Noted  . Ulnar nerve entrapment syndrome, left 12/11/2020  . Abnormality of gait 12/11/2020  . Hypocalcemia 12/08/2020  . DVT (deep venous thrombosis) ([place]) 12/08/2020  . Post-operative pain   . History of hypertension   . Acute lower UTI   . Leukocytosis   . Dysuria   . Drug induced constipation   . Neuropathic pain   . Lumbar radiculopathy 10/10/2020  . 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 S/P lumbar fusion 09/19/2020  . Benign prostatic hyperplasia with urinary hesitancy 05/09/2017  . Tongue burning sensation 04/18/2016  . Essential hypertension 10/06/2014  . Hypothyroidism due to acquired atrophy of thyroid 10/06/2014    Lanice Shirts 02/16/2021, 9:24 AM  [facility] [address] [place], [place], [address] Phone: [phone]  Fax:  [phone]  Name: Alexander Rojas MRN: [id number] Date of Birth: [birth year]

## 2021-02-16 NOTE — Therapy (Signed)
[facility] [address] [place], [place], [address] Phone: [phone]   Fax:  [phone]  Occupational Therapy Treatment  Patient Details  Name: Alexander Rojas MRN: [id number] Date of Birth: 05-05-1956 Referring Provider (OT): Courtney Heys, MD   Encounter Date: 02/16/2021   OT End of Session - 02/16/21 0804    Visit Number 3    Number of Visits 17    Date for OT Re-Evaluation 04/04/21    Authorization Type [name]    Authorization - Visit Number 2    Authorization - Number of Visits 25    OT Start Time 0802    OT Stop Time [phone]   going on hold pending surgery   OT Time Calculation (min) 10 min    Activity Tolerance Patient tolerated treatment well    Behavior During Therapy [facility] for tasks assessed/performed;Flat affect           Past Medical History:  Diagnosis Date  . Back pain   . BPH (benign prostatic hyperplasia)   . Extensor tendon laceration, finger, open wound, initial encounter    left thumb  . Hypertension   . Hypothyroidism   . Macrocytosis   . Numbness and tingling   . Pars defect with spondylolisthesis     Past Surgical History:  Procedure Laterality Date  . ABDOMINAL EXPOSURE N/A 09/19/2020   Procedure: ABDOMINAL EXPOSURE;  Surgeon: Rosetta Posner, MD;  [facility] OR;  Service: Vascular;  Laterality: N/A;  anterior  . ANTERIOR LUMBAR FUSION N/A 09/19/2020   Procedure: Anterior Lumbar Interbody Fusion Lumbar Five-Sacral One, posterior lateral fusion with gill decompression Lumbar Five-Sacral One.;  Surgeon: Eustace Moore, MD;  Location: [place];  Service: Neurosurgery;  Laterality: N/A;  anterior  . APPLICATION OF INTRAOPERATIVE CT SCAN N/A 09/27/2020   Procedure: APPLICATION OF INTRAOPERATIVE CT SCAN;  Surgeon: Eustace Moore, MD;  Location: [place];  Service: Neurosurgery;  Laterality: N/A;  . COLONOSCOPY WITH PROPOFOL N/A 12/18/2018   Procedure: COLONOSCOPY WITH BIOPSY;  Surgeon: Lucilla Lame, MD;  Location: [place];  Service: Endoscopy;  Laterality: N/A;  NEEDS TO STAY FIRST  . ESOPHAGOGASTRODUODENOSCOPY    . ESOPHAGOGASTRODUODENOSCOPY (EGD) WITH PROPOFOL N/A 02/23/2019   Procedure: ESOPHAGOGASTRODUODENOSCOPY (EGD) WITH PROPOFOL;  Surgeon: Lucilla Lame, MD;  Location: [facility] ENDOSCOPY;  Service: Endoscopy;  Laterality: N/A;  . HERNIA REPAIR    . LAMINECTOMY WITH POSTERIOR LATERAL ARTHRODESIS LEVEL 1 N/A 09/19/2020   Procedure: LAMINECTOMY WITH POSTERIOR LATERAL ARTHRODESIS LEVEL 1;  Surgeon: Eustace Moore, MD;  Location: [place];  Service: Neurosurgery;  Laterality: N/A;  posterior  . LAMINECTOMY WITH POSTERIOR LATERAL ARTHRODESIS LEVEL 2 N/A 09/27/2020   Procedure: Lumbar exploration with Extension of Posterior Instrumentation Lumbar Four to the Ilium with Airo;  Surgeon: Eustace Moore, MD;  Location: [place];  Service: Neurosurgery;  Laterality: N/A;  . LIPOMA EXCISION Right    upper arm  . POLYPECTOMY N/A 12/18/2018   Procedure: POLYPECTOMY;  Surgeon: Lucilla Lame, MD;  Location: [place];  Service: Endoscopy;  Laterality: N/A;  . TENDON REPAIR Left 11/05/2019   Procedure: LEFT THUMB TENDON REPAIR;  Surgeon: Cindra Presume, MD;  Location: [place];  Service: Plastics;  Laterality: Left;  Axillary block, MAC  . UMBILICAL HERNIA REPAIR      There were no vitals filed for this visit.   Subjective Assessment - 02/16/21 0805    Subjective  Pt reports back  pain today. Pt denies any pain in hand currently but numbness in the digits 4 and 5.    Currently in Pain? Yes    Pain Location Back    Pain Orientation Lower    Pain Descriptors / Indicators Aching    Pain Type Chronic pain    Pain Onset More than a month ago    Pain Frequency Constant             Pt going on hold at this time d/t pending surgery for ulnar nerve transposition. Pt agreeable to this plan. Reviewed ulnar glides and pt demonstrated understanding. Pt had questions  about particpating in PT post op and OT educated on return orders needed but able to participate in the therapy with physical therapy with whatever precautions the surgeon provides.                     OT Short Term Goals - 02/13/21 1037      OT SHORT TERM GOAL #1   Title Pt will be independent with any HEPs    Time 4    Period Weeks    Status On-going    Target Date 03/07/21      OT SHORT TERM GOAL #2   Title Pt will verbalize understanding of adapted strategies for sensory and positioning of LUE for increasing comfort.    Time 4    Period Weeks    Status On-going      OT SHORT TERM GOAL #3   Title Pt will be mod I with any splint wear and care instructions PRN    Time 4    Period Weeks    Status New             OT Long Term Goals - 02/07/21 1012      OT LONG TERM GOAL #1   Title Pt will be independent with any updated HEPs    Time 8    Period Weeks    Status New    Target Date 04/04/21      OT LONG TERM GOAL #2   Title Pt will demonstrate increased grip strength in LUE by 5 lbs or more    Baseline R 81.5 L 53.5    Time 8    Period Weeks    Status New      OT LONG TERM GOAL #3   Title Pt will demonstrate increased fine motor coordination to perform job duties with improved accuracy and precision.    Time 8    Period Weeks    Status New      OT LONG TERM GOAL #4   Title Further assess splinting and brace needs PRN    Time 8    Period Weeks    Status New                 Plan - 02/16/21 1610    Clinical Impression Statement Pt is scheduling ulnar nerve transposition with Dr. Amedeo Plenty. Will go on hold until surgery is complete and ready to return to therapy.    OT Occupational Profile and History Problem Focused Assessment - Including review of records relating to presenting problem    Occupational performance deficits (Please refer to evaluation for details): ADL's;IADL's;Work;Leisure    Body Structure / Function / Physical Skills  ADL;Dexterity;ROM;Edema;Scar mobility;Sensation;Mobility;Skin integrity;Flexibility;Strength;Coordination;FMC;Pain;UE functional use;IADL;Decreased knowledge of use of DME    Rehab Potential Good    Clinical Decision Making Limited treatment options, no task  modification necessary    Comorbidities Affecting Occupational Performance: None    Modification or Assistance to Complete Evaluation  No modification of tasks or assist necessary to complete eval    OT Frequency 1x / week    OT Duration 6 weeks   may d/c early based on progress   OT Treatment/Interventions Self-care/ADL training;Fluidtherapy;Splinting;Therapeutic activities;Ultrasound;Therapeutic exercise;Scar mobilization;Cryotherapy;Passive range of motion;Manual Therapy;Patient/family education;Paraffin;DME and/or AE instruction    Plan going on hold until surgery.    OT Home Exercise Plan see pt instruction    Consulted and Agree with Plan of Care Patient           Patient will benefit from skilled therapeutic intervention in order to improve the following deficits and impairments:   Body Structure / Function / Physical Skills: ADL,Dexterity,ROM,Edema,Scar mobility,Sensation,Mobility,Skin integrity,Flexibility,Strength,Coordination,FMC,Pain,UE functional use,IADL,Decreased knowledge of use of DME       Visit Diagnosis: Other lack of coordination  Muscle weakness (generalized)  Pain in left elbow  Other disturbances of skin sensation  Pain in joint of left hand    Problem List Patient Active Problem List   Diagnosis Date Noted  . Ulnar nerve entrapment syndrome, left 12/11/2020  . Abnormality of gait 12/11/2020  . Hypocalcemia 12/08/2020  . DVT (deep venous thrombosis) ([place]) 12/08/2020  . Post-operative pain   . History of hypertension   . Acute lower UTI   . Leukocytosis   . Dysuria   . Drug induced constipation   . Neuropathic pain   . Lumbar radiculopathy 10/10/2020  . 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 S/P lumbar fusion 09/19/2020  . Benign prostatic hyperplasia with urinary hesitancy 05/09/2017  . Tongue burning sensation 04/18/2016  . Essential hypertension 10/06/2014  . Hypothyroidism due to acquired atrophy of thyroid 10/06/2014    Zachery Conch MOT, OTR/L  02/16/2021, 8:24 AM  [facility] [address] [place], [place], [address] Phone: [phone]   Fax:  [phone]  Name: Alexander Rojas MRN: [id number] Date of Birth: 12-11-1955

## 2021-02-20 ENCOUNTER — Encounter: Payer: Self-pay | Admitting: Physical Therapy

## 2021-02-20 ENCOUNTER — Ambulatory Visit: Payer: 59 | Attending: Physical Medicine and Rehabilitation | Admitting: Physical Therapy

## 2021-02-20 ENCOUNTER — Other Ambulatory Visit: Payer: Self-pay

## 2021-02-20 ENCOUNTER — Ambulatory Visit: Payer: 59 | Admitting: Occupational Therapy

## 2021-02-20 DIAGNOSIS — G8929 Other chronic pain: Secondary | ICD-10-CM | POA: Diagnosis not present

## 2021-02-20 DIAGNOSIS — R2681 Unsteadiness on feet: Secondary | ICD-10-CM | POA: Insufficient documentation

## 2021-02-20 DIAGNOSIS — M6281 Muscle weakness (generalized): Secondary | ICD-10-CM | POA: Diagnosis not present

## 2021-02-20 DIAGNOSIS — M545 Low back pain, unspecified: Secondary | ICD-10-CM | POA: Diagnosis not present

## 2021-02-20 MED ORDER — TYMLOS 3120 MCG/1.56ML ~~LOC~~ SOPN: PEN_INJECTOR | SUBCUTANEOUS | 11 refills | Status: DC

## 2021-02-20 NOTE — Therapy (Signed)
[facility] [address] [place], [place], [address] Phone: [phone]   Fax:  [phone]  Physical Therapy Treatment  Patient Details  Name: Alexander Rojas MRN: [id number] Date of Birth: 1955-11-29 Referring Provider (PT): Courtney Heys, MD   Encounter Date: 02/20/2021   PT End of Session - 02/20/21 1025    Visit Number 4    Number of Visits 13    Authorization Type [name]    PT Start Time [phone]    PT Stop Time 1012    PT Time Calculation (min) 41 min    Activity Tolerance Patient tolerated treatment well    Behavior During Therapy [facility] for tasks assessed/performed           Past Medical History:  Diagnosis Date  . Back pain   . BPH (benign prostatic hyperplasia)   . Extensor tendon laceration, finger, open wound, initial encounter    left thumb  . Hypertension   . Hypothyroidism   . Macrocytosis   . Numbness and tingling   . Pars defect with spondylolisthesis     Past Surgical History:  Procedure Laterality Date  . ABDOMINAL EXPOSURE N/A 09/19/2020   Procedure: ABDOMINAL EXPOSURE;  Surgeon: Rosetta Posner, MD;  Location: [facility] OR;  Service: Vascular;  Laterality: N/A;  anterior  . ANTERIOR LUMBAR FUSION N/A 09/19/2020   Procedure: Anterior Lumbar Interbody Fusion Lumbar Five-Sacral One, posterior lateral fusion with gill decompression Lumbar Five-Sacral One.;  Surgeon: Eustace Moore, MD;  Location: [facility];  Service: Neurosurgery;  Laterality: N/A;  anterior  . APPLICATION OF INTRAOPERATIVE CT SCAN N/A 09/27/2020   Procedure: APPLICATION OF INTRAOPERATIVE CT SCAN;  Surgeon: Eustace Moore, MD;  Location: [facility];  Service: Neurosurgery;  Laterality: N/A;  . COLONOSCOPY WITH PROPOFOL N/A 12/18/2018   Procedure: COLONOSCOPY WITH BIOPSY;  Surgeon: Lucilla Lame, MD;  Location: [facility];  Service: Endoscopy;  Laterality: N/A;  NEEDS TO STAY FIRST  . ESOPHAGOGASTRODUODENOSCOPY    .  ESOPHAGOGASTRODUODENOSCOPY (EGD) WITH PROPOFOL N/A 02/23/2019   Procedure: ESOPHAGOGASTRODUODENOSCOPY (EGD) WITH PROPOFOL;  Surgeon: Lucilla Lame, MD;  Location: [facility] ENDOSCOPY;  Service: Endoscopy;  Laterality: N/A;  . HERNIA REPAIR    . LAMINECTOMY WITH POSTERIOR LATERAL ARTHRODESIS LEVEL 1 N/A 09/19/2020   Procedure: LAMINECTOMY WITH POSTERIOR LATERAL ARTHRODESIS LEVEL 1;  Surgeon: Eustace Moore, MD;  Location: [place];  Service: Neurosurgery;  Laterality: N/A;  posterior  . LAMINECTOMY WITH POSTERIOR LATERAL ARTHRODESIS LEVEL 2 N/A 09/27/2020   Procedure: Lumbar exploration with Extension of Posterior Instrumentation Lumbar Four to the Ilium with Airo;  Surgeon: Eustace Moore, MD;  Location: [facility];  Service: Neurosurgery;  Laterality: N/A;  . LIPOMA EXCISION Right    upper arm  . POLYPECTOMY N/A 12/18/2018   Procedure: POLYPECTOMY;  Surgeon: Lucilla Lame, MD;  Location: [facility];  Service: Endoscopy;  Laterality: N/A;  . TENDON REPAIR Left 11/05/2019   Procedure: LEFT THUMB TENDON REPAIR;  Surgeon: Cindra Presume, MD;  Location: [facility];  Service: Plastics;  Laterality: Left;  Axillary block, MAC  . UMBILICAL HERNIA REPAIR      There were no vitals filed for this visit.   Subjective Assessment - 02/20/21 0934    Subjective Had a bad reaction to the phosphomax medication. Broke out in hives. will be following up with his dermatologist. Surgery was supposed to be today, but now it is set for june 3rd. Back is doing  pretty good today.    Pertinent History PMH:  HTN, osteopenia    Limitations Walking;Standing    Diagnostic tests MRI lumbar spine 11/04/20; 1. Posterior fusion spanning L4-S1. No significant residual canal  stenosis.  2. Persistent anterolisthesis of L5 on S1 with similar moderate  bilateral foraminal stenosis at L5-S1.  3. Mild to moderate right subarticular recess stenosis at L4-L5.    Patient Stated Goals wants to make his back pain better, build up  strength and stamina.    Currently in Pain? No/denies    Pain Onset More than a month ago                             [facility] Adult PT Treatment/Exercise - 02/20/21 0937      Lumbar Exercises: Seated   Other Seated Lumbar Exercises seated on green disc at edge of mat: x10 reps ab set, x10 reps scapular retraction, x10 reps B alternating marching (cues for TA activation), x10 reps gentle A/P pelvic tilts      Lumbar Exercises: Supine   Pelvic Tilt 10 reps    Pelvic Tilt Limitations posterior pelvic tilt    Clam 10 reps;Limitations    Clam Limitations with green tband, x10 reps on LLE, 2 x 10 reps RLE, initial cues for technique and proper positioning    Dead Bug Limitations;10 reps    Dead Bug Limitations cues for TA activation    Bridge 10 reps;Compliant    Bridge Limitations initial cues for technique, initial pain but improved with incr reps    Other Supine Lumbar Exercises supine marching x5 reps B    Other Supine Lumbar Exercises supine hooklying 2x10 reps ball squeezes with 3 second hold. reviewed bent knee fall outs given as HEP from last session x10 reps B with blue tband      Knee/Hip Exercises: Aerobic   Nustep with BLE only at gear 3 for 5 minutes for strengthening, ROM.                    PT Short Term Goals - 02/13/21 1157      PT SHORT TERM GOAL #1   Title Pt will initiate aquatic therapy in order to perform HEP at home in his pool. ALL STGS DUE 03/09/21    Time 4    Period Weeks    Status New    Target Date 03/09/21      PT SHORT TERM GOAL #2   Title Will perform ODI with LTG written as appropriate.    Baseline 40% = moderate disability, LTG written    Time 4    Period Weeks    Status Achieved      PT SHORT TERM GOAL #3   Title Perform 6MWT and LTG to be written as appropriate to demo improved endurance/stamina.    Time 4    Period Weeks    Status New      PT SHORT TERM GOAL #4   Title Pt will be independent with initial HEP in  therapy (land based) in order to build upon functional gains.    Time 4    Period Weeks    Status New             PT Long Term Goals - 02/13/21 1206      PT LONG TERM GOAL #1   Title Pt will decr ODI score to 30% or less in order to demo  decr disability due to low back pain.ALL LTGS DUE 04/06/21    Baseline 40% on 02/13/21    Time 8    Period Weeks    Status Revised      PT LONG TERM GOAL #2   Title Pt will be independent with final HEP in therapy (land based) in order to build upon functional gains.    Time 8    Period Weeks    Status New      PT LONG TERM GOAL #3   Title 6MWT goal to be written as appropriate in order to demo improved endurance.    Time 8    Period Weeks    Status New      PT LONG TERM GOAL #4   Title Pt will improve R hip ABD strength to at least a 4+/5 in order to help improve gait mechanics.    Baseline 4-/5    Time 8    Period Weeks    Status New      PT LONG TERM GOAL #5   Title Pt will report low back pain to a 2/10 or less in order to improve functional moiblity.    Baseline 4/10 (having a good day at eval)    Time 8    Period Weeks    Status New                 Plan - 02/20/21 1026    Clinical Impression Statement Today's skilled session continued to focus on B hip strengthening, core activation, and lumbar ROM. Pt tolerated session well. Had initial pain with bridging, but improved with incr reps. Pt with most low back pain for first 30 seconds-1 minute in hooklying position (after transition from seated at edge of mat). Will continue to progress towards LTGs.    Personal Factors and Comorbidities Comorbidity 2;Profession;Time since onset of injury/illness/exacerbation    Comorbidities HTN, osteopenia    Examination-Activity Limitations Stand;Stairs;Transfers;Locomotion Level   laying supine   Examination-Participation Restrictions Community Activity;Occupation    Stability/Clinical Decision Making Evolving/Moderate complexity     Rehab Potential Good    PT Frequency 2x / week   followed by 1x week for 4 weeks   PT Duration 8 weeks   followed by 1x week for 4 weeks   PT Treatment/Interventions ADLs/Self Care Home Management;Aquatic Therapy;Gait training;Therapeutic activities;Neuromuscular re-education;Balance training;Therapeutic exercise;Patient/family education;Manual techniques;Passive range of motion;Vestibular;Electrical Stimulation    PT Next Visit Plan any update from HEP? Continue core strenghening and flexibility task and hip strengthening, PF strength, pursue gentle stretch/strength tasks, seated balance on compliant surfaces    Consulted and Agree with Plan of Care Patient           Patient will benefit from skilled therapeutic intervention in order to improve the following deficits and impairments:  Abnormal gait,Decreased mobility,Decreased range of motion,Decreased strength,Hypomobility,Decreased coordination,Impaired flexibility,Pain,Postural dysfunction  Visit Diagnosis: Muscle weakness (generalized)  Chronic bilateral low back pain without sciatica     Problem List Patient Active Problem List   Diagnosis Date Noted  . Ulnar nerve entrapment syndrome, left 12/11/2020  . Abnormality of gait 12/11/2020  . Hypocalcemia 12/08/2020  . DVT (deep venous thrombosis) ([place]) 12/08/2020  . Post-operative pain   . History of hypertension   . Acute lower UTI   . Leukocytosis   . Dysuria   . Drug induced constipation   . Neuropathic pain   . Lumbar radiculopathy 10/10/2020  . Hardware failure of anterior column of spine ([place]) 09/26/2020  .  S/P lumbar fusion 09/19/2020  . Benign prostatic hyperplasia with urinary hesitancy 05/09/2017  . Tongue burning sensation 04/18/2016  . Essential hypertension 10/06/2014  . Hypothyroidism due to acquired atrophy of thyroid 10/06/2014    Arliss Journey, PT, DPT  02/20/2021, 10:28 AM  [facility] [address] [place], [place], [address] Phone: [phone]   Fax:  [phone]  Name: Alexander Rojas MRN: [id number] Date of Birth: 08-14-1956

## 2021-02-21 ENCOUNTER — Ambulatory Visit
Admission: RE | Admit: 2021-02-21 | Discharge: 2021-02-21 | Disposition: A | Discharge status: Home / Self Care | Payer: 59 | Source: Ambulatory Visit | Attending: Family Medicine | Admitting: Family Medicine

## 2021-02-21 ENCOUNTER — Ambulatory Visit: Payer: 59 | Attending: Family Medicine | Admitting: Pharmacist

## 2021-02-21 ENCOUNTER — Other Ambulatory Visit (HOSPITAL_COMMUNITY): Payer: Self-pay

## 2021-02-21 ENCOUNTER — Other Ambulatory Visit: Payer: Self-pay

## 2021-02-21 ENCOUNTER — Other Ambulatory Visit: Payer: Self-pay | Admitting: Pharmacist

## 2021-02-21 VITALS — BP 133/85 | HR 60 | Temp 98.9°F | Resp 18

## 2021-02-21 DIAGNOSIS — R21 Rash and other nonspecific skin eruption: Secondary | ICD-10-CM

## 2021-02-21 DIAGNOSIS — Z79899 Other long term (current) drug therapy: Secondary | ICD-10-CM

## 2021-02-21 MED ORDER — TYMLOS 3120 MCG/1.56ML ~~LOC~~ SOPN
PEN_INJECTOR | SUBCUTANEOUS | 11 refills | Status: AC
  Filled 2021-02-21: qty 1.56, fill #0
  Filled 2021-02-23 (×2): qty 1.56, 30d supply, fill #0
  Filled 2021-03-26: qty 1.56, 30d supply, fill #1
  Filled 2021-04-25: qty 1.56, 30d supply, fill #2
  Filled 2021-05-22: qty 1.56, 30d supply, fill #3
  Filled 2021-06-21: qty 1.56, 30d supply, fill #4
  Filled 2021-07-17: qty 1.56, 30d supply, fill #5
  Filled 2021-09-11: qty 1.56, 30d supply, fill #6
  Filled 2021-10-05: qty 1.56, 30d supply, fill #7

## 2021-02-21 MED ORDER — KETOCONAZOLE 2 % EX CREA
1.0000 "application " | TOPICAL_CREAM | Freq: Two times a day (BID) | CUTANEOUS | 0 refills | Status: DC | PRN
  Filled 2021-02-21: qty 60, 30d supply, fill #0

## 2021-02-21 MED ORDER — PREDNISONE 10 MG PO TABS
ORAL_TABLET | ORAL | 0 refills | Status: DC
  Filled 2021-02-21: qty 42, 12d supply, fill #0

## 2021-02-21 NOTE — Progress Notes (Signed)
   S: Patient presents today for review of their specialty medication.   Patient is prescribed Tymlos for osteoporosis. Patient is managed by Dr. Honor Junes for this. Reviewed his note from 01/30/2021. Bone density studies were consistent with osteopenia but pt had a sacral fracture that qualifies him for dx of osteoporosis. He was started on bisphosphonate therapy but developed GERD/GI issues from this. Dr. Honor Junes recommended to change to [facility].   Dosing: 51mcg subq once daily   Adherence: has not yet started   Efficacy: has not yet started   Monitoring:  Osteosarcoma: has not started Orthostatic hypotension: has not started Hypercalcemia: has not started Urolithiasis: has not started Hypersensitivity: has not started   Current adverse effects: none   O:     Lab Results  Component Value Date   WBC 5.7 12/08/2020   HGB 13.9 12/08/2020   HCT 40.9 12/08/2020   MCV 94.9 12/08/2020   PLT 208.0 12/08/2020      Chemistry      Component Value Date/Time   NA 141 12/08/2020 0755   NA 142 07/09/2012 0959   K 3.8 12/08/2020 0755   K 3.7 07/09/2012 0959   CL 106 12/08/2020 0755   CL 108 (H) 07/09/2012 0959   CO2 30 12/08/2020 0755   CO2 25 07/09/2012 0959   BUN 17 12/08/2020 0755   BUN 15 07/09/2012 0959   CREATININE 0.80 12/08/2020 0755   CREATININE 0.92 07/09/2012 0959      Component Value Date/Time   CALCIUM 9.1 12/08/2020 0755   CALCIUM 9.3 12/08/2020 0755   CALCIUM 8.7 07/09/2012 0959   ALKPHOS 79 12/08/2020 0755   ALKPHOS 77 07/09/2012 0959   AST 16 12/08/2020 0755   AST 19 07/09/2012 0959   ALT 19 12/08/2020 0755   ALT 22 07/09/2012 0959   BILITOT 0.6 12/08/2020 0755   BILITOT 0.4 07/09/2012 0959       A/P: 1. Medication review: patient currently prescribed Tymlos for osteoporosis. Reviewed the medication with the patient. Reviewed dosing, proper storage of the pen. Patient with no questions regarding adverse events or required monitoring. No  contraindications exist to therapy. No recommendations for any changes at this time.   Benard Halsted, PharmD, [place], [place] [phone]

## 2021-02-21 NOTE — ED Triage Notes (Signed)
Pt c/o itchy rash all over for a week. Denies new products or any changes to cause rash. Used OTC creams with no relief.

## 2021-02-22 ENCOUNTER — Encounter: Payer: Self-pay | Admitting: Physical Therapy

## 2021-02-22 ENCOUNTER — Other Ambulatory Visit: Payer: Self-pay

## 2021-02-22 ENCOUNTER — Ambulatory Visit: Payer: 59 | Admitting: Physical Therapy

## 2021-02-22 DIAGNOSIS — G8929 Other chronic pain: Secondary | ICD-10-CM

## 2021-02-22 DIAGNOSIS — M545 Low back pain, unspecified: Secondary | ICD-10-CM | POA: Diagnosis not present

## 2021-02-22 DIAGNOSIS — R2681 Unsteadiness on feet: Secondary | ICD-10-CM | POA: Diagnosis not present

## 2021-02-22 DIAGNOSIS — M6281 Muscle weakness (generalized): Secondary | ICD-10-CM

## 2021-02-22 NOTE — Therapy (Signed)
[facility] [address] [place], [place], [address] Phone: [phone]   Fax:  [phone]  Physical Therapy Treatment  Patient Details  Name: Alexander Rojas MRN: [id number] Date of Birth: 08-16-1956 Referring Provider (PT): Courtney Heys, MD   Encounter Date: 02/22/2021   PT End of Session - 02/22/21 0948    Visit Number 5    Number of Visits 13    Authorization Type [name]    PT Start Time [phone]    PT Stop Time 0932    PT Time Calculation (min) 44 min    Activity Tolerance Patient tolerated treatment well    Behavior During Therapy [facility] for tasks assessed/performed           Past Medical History:  Diagnosis Date  . Back pain   . BPH (benign prostatic hyperplasia)   . Extensor tendon laceration, finger, open wound, initial encounter    left thumb  . Hypertension   . Hypothyroidism   . Macrocytosis   . Numbness and tingling   . Pars defect with spondylolisthesis     Past Surgical History:  Procedure Laterality Date  . ABDOMINAL EXPOSURE N/A 09/19/2020   Procedure: ABDOMINAL EXPOSURE;  Surgeon: Rosetta Posner, MD;  Location[facility] OR;  Service: Vascular;  Laterality: N/A;  anterior  . ANTERIOR LUMBAR FUSION N/A 09/19/2020   Procedure: Anterior Lumbar Interbody Fusion Lumbar Five-Sacral One, posterior lateral fusion with gill decompression Lumbar Five-Sacral One.;  Surgeon: Eustace Moore, MD;  Location: [facility];  Service: Neurosurgery;  Laterality: N/A;  anterior  . APPLICATION OF INTRAOPERATIVE CT SCAN N/A 09/27/2020   Procedure: APPLICATION OF INTRAOPERATIVE CT SCAN;  Surgeon: Eustace Moore, MD;  Location: [place];  Service: Neurosurgery;  Laterality: N/A;  . COLONOSCOPY WITH PROPOFOL N/A 12/18/2018   Procedure: COLONOSCOPY WITH BIOPSY;  Surgeon: Lucilla Lame, MD;  Location: [facility];  Service: Endoscopy;  Laterality: N/A;  NEEDS TO STAY FIRST  . ESOPHAGOGASTRODUODENOSCOPY    .  ESOPHAGOGASTRODUODENOSCOPY (EGD) WITH PROPOFOL N/A 02/23/2019   Procedure: ESOPHAGOGASTRODUODENOSCOPY (EGD) WITH PROPOFOL;  Surgeon: Lucilla Lame, MD;  Location: [facility] ENDOSCOPY;  Service: Endoscopy;  Laterality: N/A;  . HERNIA REPAIR    . LAMINECTOMY WITH POSTERIOR LATERAL ARTHRODESIS LEVEL 1 N/A 09/19/2020   Procedure: LAMINECTOMY WITH POSTERIOR LATERAL ARTHRODESIS LEVEL 1;  Surgeon: Eustace Moore, MD;  Location: [facility];  Service: Neurosurgery;  Laterality: N/A;  posterior  . LAMINECTOMY WITH POSTERIOR LATERAL ARTHRODESIS LEVEL 2 N/A 09/27/2020   Procedure: Lumbar exploration with Extension of Posterior Instrumentation Lumbar Four to the Ilium with Airo;  Surgeon: Eustace Moore, MD;  Location: [facility];  Service: Neurosurgery;  Laterality: N/A;  . LIPOMA EXCISION Right    upper arm  . POLYPECTOMY N/A 12/18/2018   Procedure: POLYPECTOMY;  Surgeon: Lucilla Lame, MD;  Location: [facility];  Service: Endoscopy;  Laterality: N/A;  . TENDON REPAIR Left 11/05/2019   Procedure: LEFT THUMB TENDON REPAIR;  Surgeon: Cindra Presume, MD;  Location: [facility];  Service: Plastics;  Laterality: Left;  Axillary block, MAC  . UMBILICAL HERNIA REPAIR      There were no vitals filed for this visit.   Subjective Assessment - 02/22/21 0850    Subjective Went to the ER yesterday due to skin rash - thinks that it is due to a drug reaction. Prescribed him prednisone and reports that is making it feel better. Back didn't feel the best after  tuesday with therapy, but felt better on wednesday and today.    Pertinent History PMH:  HTN, osteopenia    Limitations Walking;Standing    Diagnostic tests MRI lumbar spine 11/04/20; 1. Posterior fusion spanning L4-S1. No significant residual canal  stenosis.  2. Persistent anterolisthesis of L5 on S1 with similar moderate  bilateral foraminal stenosis at L5-S1.  3. Mild to moderate right subarticular recess stenosis at L4-L5.    Patient Stated Goals wants to  make his back pain better, build up strength and stamina.    Currently in Pain? No/denies    Pain Onset More than a month ago                             [facility] Adult PT Treatment/Exercise - 02/22/21 0928      Lumbar Exercises: Supine   Pelvic Tilt 10 reps    Pelvic Tilt Limitations posterior pelvic tilt, initial cues for technique      Knee/Hip Exercises: Stretches   Sports administrator Both;2 reps;30 seconds;Limitations    Sports administrator Limitations pt prone with pillow under hips, therapist performing stretch to pt's tolerance    Piriformis Stretch Both;2 reps;30 seconds    Piriformis Stretch Limitations pt reporting incr low back pain when performing stretch when pressing done with hand on leg, discussed with pt bringing opposite leg in closer to buttocks and bringing leg over for supine figure 4, pt reporting feeling no back pain when performing this way, discussed this can be how pt modifies at home    Other Knee/Hip Stretches supine single knee to chest stretch from therapist 2 x 30 seconds B      Knee/Hip Exercises: Aerobic   Tread Mill aerobic warm up on treadmill; 4:30 on 1.0 mph and additional 1:30 on 1.5 mph - cues for heel strike during gait      Knee/Hip Exercises: Standing   Lateral Step Up Both;1 set;10 reps    Lateral Step Up Limitations 4" step with air ex on stop, stepping up and floating non-stance leg, intermittent touch when stepping up with RLE, cues for glute activation during SLS, incr difficulty with RLE    Forward Step Up Both;1 set;10 reps    Forward Step Up Limitations 4" step with air ex on stop, stepping up and floating non-stance leg, intermittent touch when stepping up with RLE, cues for glute activation during SLS    Other Standing Knee Exercises 2 x 10 reps heel raises while standing on blue air ex, pt unable to reach full ROM no UE support                  PT Education - 02/22/21 0947    Education Details gave pt appt times for  aquatic therapy on monday - pt to call clinic to see what time would work    [employer]) Educated Patient    Methods Explanation    Comprehension Verbalized understanding            PT Short Term Goals - 02/13/21 1157      PT SHORT TERM GOAL #1   Title Pt will initiate aquatic therapy in order to perform HEP at home in his pool. ALL STGS DUE 03/09/21    Time 4    Period Weeks    Status New    Target Date 03/09/21      PT SHORT TERM GOAL #2   Title Will perform ODI with LTG  written as appropriate.    Baseline 40% = moderate disability, LTG written    Time 4    Period Weeks    Status Achieved      PT SHORT TERM GOAL #3   Title Perform 6MWT and LTG to be written as appropriate to demo improved endurance/stamina.    Time 4    Period Weeks    Status New      PT SHORT TERM GOAL #4   Title Pt will be independent with initial HEP in therapy (land based) in order to build upon functional gains.    Time 4    Period Weeks    Status New             PT Long Term Goals - 02/13/21 1206      PT LONG TERM GOAL #1   Title Pt will decr ODI score to 30% or less in order to demo decr disability due to low back pain.ALL LTGS DUE 04/06/21    Baseline 40% on 02/13/21    Time 8    Period Weeks    Status Revised      PT LONG TERM GOAL #2   Title Pt will be independent with final HEP in therapy (land based) in order to build upon functional gains.    Time 8    Period Weeks    Status New      PT LONG TERM GOAL #3   Title 6MWT goal to be written as appropriate in order to demo improved endurance.    Time 8    Period Weeks    Status New      PT LONG TERM GOAL #4   Title Pt will improve R hip ABD strength to at least a 4+/5 in order to help improve gait mechanics.    Baseline 4-/5    Time 8    Period Weeks    Status New      PT LONG TERM GOAL #5   Title Pt will report low back pain to a 2/10 or less in order to improve functional moiblity.    Baseline 4/10 (having a good day at  eval)    Time 8    Period Weeks    Status New                 Plan - 02/22/21 0950    Clinical Impression Statement Today's skilled session focused on BLE strengthening with added compliant surface for balance, core stability and stretching low back and B quadriceps. Pt with no pain with exercises, but does have most low back pain when transitioning from sit > supine. Modified pt's piriformis stretch while lying supine for incr stretch and to prevent low back pain. Pt tolerated well.    Personal Factors and Comorbidities Comorbidity 2;Profession;Time since onset of injury/illness/exacerbation    Comorbidities HTN, osteopenia    Examination-Activity Limitations Stand;Stairs;Transfers;Locomotion Level   laying supine   Examination-Participation Restrictions Community Activity;Occupation    Stability/Clinical Decision Making Evolving/Moderate complexity    Rehab Potential Good    PT Frequency 2x / week   followed by 1x week for 4 weeks   PT Duration 8 weeks   followed by 1x week for 4 weeks   PT Treatment/Interventions ADLs/Self Care Home Management;Aquatic Therapy;Gait training;Therapeutic activities;Neuromuscular re-education;Balance training;Therapeutic exercise;Patient/family education;Manual techniques;Passive range of motion;Vestibular;Electrical Stimulation    PT Next Visit Plan aquatics? Continue core strenghening and flexibility task and hip strengthening, PF strength, pursue gentle stretch/strength tasks, seated  balance on compliant surfaces    Consulted and Agree with Plan of Care Patient           Patient will benefit from skilled therapeutic intervention in order to improve the following deficits and impairments:  Abnormal gait,Decreased mobility,Decreased range of motion,Decreased strength,Hypomobility,Decreased coordination,Impaired flexibility,Pain,Postural dysfunction  Visit Diagnosis: Muscle weakness (generalized)  Chronic bilateral low back pain without  sciatica     Problem List Patient Active Problem List   Diagnosis Date Noted  . Ulnar nerve entrapment syndrome, left 12/11/2020  . Abnormality of gait 12/11/2020  . Hypocalcemia 12/08/2020  . DVT (deep venous thrombosis) ([place]) 12/08/2020  . Post-operative pain   . History of hypertension   . Acute lower UTI   . Leukocytosis   . Dysuria   . Drug induced constipation   . Neuropathic pain   . Lumbar radiculopathy 10/10/2020  . 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 S/P lumbar fusion 09/19/2020  . Benign prostatic hyperplasia with urinary hesitancy 05/09/2017  . Tongue burning sensation 04/18/2016  . Essential hypertension 10/06/2014  . Hypothyroidism due to acquired atrophy of thyroid 10/06/2014    Arliss Journey, PT, DPT  02/22/2021, 9:51 AM  [facility] [address] [place], [place], [address] Phone: [phone]   Fax:  [phone]  Name: Alexander Rojas MRN: [id number] Date of Birth: 06/05/1956

## 2021-02-23 ENCOUNTER — Encounter: Payer: 59 | Admitting: Occupational Therapy

## 2021-02-23 ENCOUNTER — Other Ambulatory Visit (HOSPITAL_COMMUNITY): Payer: Self-pay

## 2021-02-23 ENCOUNTER — Ambulatory Visit: Payer: 59 | Admitting: Physical Therapy

## 2021-02-25 NOTE — ED Provider Notes (Signed)
[facility]    CSN: [id number] Arrival date & time: 02/21/21  0939      History   Chief Complaint Chief Complaint  Patient presents with  . appt 10 - rash    HPI Alexander Rojas is a 65 y.o. male.   Presenting today with intensely pruritic rash sporadically across body x 1 week that seems to be continuing to spread. No recent outdoor or animal exposures, no new hygiene products, recent travel, new supplements or foods. Just started on fosamax 2 weeks ago but otherwise no new medication changes. Stopped the medication after last week's dose, no improvement thus far since d/c. Denies associated sxs such as fever, chills, myalgias, CP, SOB. Using steroid creams with no benefit so far.      Past Medical History:  Diagnosis Date  . Back pain   . BPH (benign prostatic hyperplasia)   . Extensor tendon laceration, finger, open wound, initial encounter    left thumb  . Hypertension   . Hypothyroidism   . Macrocytosis   . Numbness and tingling   . Pars defect with spondylolisthesis     Patient Active Problem List   Diagnosis Date Noted  . Ulnar nerve entrapment syndrome, left 12/11/2020  . Abnormality of gait 12/11/2020  . Hypocalcemia 12/08/2020  . DVT (deep venous thrombosis) ([place]) 12/08/2020  . Post-operative pain   . History of hypertension   . Acute lower UTI   . Leukocytosis   . Dysuria   . Drug induced constipation   . Neuropathic pain   . Lumbar radiculopathy 10/10/2020  . 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 S/P lumbar fusion 09/19/2020  . Benign prostatic hyperplasia with urinary hesitancy 05/09/2017  . Tongue burning sensation 04/18/2016  . Essential hypertension 10/06/2014  . Hypothyroidism due to acquired atrophy of thyroid 10/06/2014    Past Surgical History:  Procedure Laterality Date  . ABDOMINAL EXPOSURE N/A 09/19/2020   Procedure: ABDOMINAL EXPOSURE;  Surgeon: Rosetta Posner, MD;  Location: [facility] OR;  Service:  Vascular;  Laterality: N/A;  anterior  . ANTERIOR LUMBAR FUSION N/A 09/19/2020   Procedure: Anterior Lumbar Interbody Fusion Lumbar Five-Sacral One, posterior lateral fusion with gill decompression Lumbar Five-Sacral One.;  Surgeon: Eustace Moore, MD;  Location: [place];  Service: Neurosurgery;  Laterality: N/A;  anterior  . APPLICATION OF INTRAOPERATIVE CT SCAN N/A 09/27/2020   Procedure: APPLICATION OF INTRAOPERATIVE CT SCAN;  Surgeon: Eustace Moore, MD;  Location: [place];  Service: Neurosurgery;  Laterality: N/A;  . COLONOSCOPY WITH PROPOFOL N/A 12/18/2018   Procedure: COLONOSCOPY WITH BIOPSY;  Surgeon: Lucilla Lame, MD;  Location: [place];  Service: Endoscopy;  Laterality: N/A;  NEEDS TO STAY FIRST  . ESOPHAGOGASTRODUODENOSCOPY    . ESOPHAGOGASTRODUODENOSCOPY (EGD) WITH PROPOFOL N/A 02/23/2019   Procedure: ESOPHAGOGASTRODUODENOSCOPY (EGD) WITH PROPOFOL;  Surgeon: Lucilla Lame, MD;  Location: [facility] ENDOSCOPY;  Service: Endoscopy;  Laterality: N/A;  . HERNIA REPAIR    . LAMINECTOMY WITH POSTERIOR LATERAL ARTHRODESIS LEVEL 1 N/A 09/19/2020   Procedure: LAMINECTOMY WITH POSTERIOR LATERAL ARTHRODESIS LEVEL 1;  Surgeon: Eustace Moore, MD;  Location: [place];  Service: Neurosurgery;  Laterality: N/A;  posterior  . LAMINECTOMY WITH POSTERIOR LATERAL ARTHRODESIS LEVEL 2 N/A 09/27/2020   Procedure: Lumbar exploration with Extension of Posterior Instrumentation Lumbar Four to the Ilium with Airo;  Surgeon: Eustace Moore, MD;  Location: [place];  Service: Neurosurgery;  Laterality: N/A;  . LIPOMA EXCISION Right  upper arm  . POLYPECTOMY N/A 12/18/2018   Procedure: POLYPECTOMY;  Surgeon: Lucilla Lame, MD;  Location: [place];  Service: Endoscopy;  Laterality: N/A;  . TENDON REPAIR Left 11/05/2019   Procedure: LEFT THUMB TENDON REPAIR;  Surgeon: Cindra Presume, MD;  Location: [place];  Service: Plastics;  Laterality: Left;  Axillary block, MAC  . UMBILICAL HERNIA REPAIR          Home Medications    Prior to Admission medications   Medication Sig Start Date End Date Taking? Authorizing Provider  ketoconazole (NIZORAL) 2 % cream Apply 1 application topically 2 (two) times daily as needed for irritation. 02/21/21  Yes Volney American, PA-C  predniSONE (DELTASONE) 10 MG tablet Take 6 tabs daily x 2 days, 5 tabs daily x 2 days, 4 tabs daily x 2 days, etc 02/21/21  Yes Volney American, PA-C  Abaloparatide (TYMLOS) 3120 MCG/1.56ML SOPN Inject 80 mcg subcutaneously once daily 02/21/21   Tresa Garter, MD  alendronate (FOSAMAX) 70 MG tablet Take one tablet by mouth every Sunday on empty stomach with a full glass of water. Do not lie down or eat/drink anything else for the next 30 min. 01/30/21     Calcium Carbonate-Vitamin D 600-400 MG-UNIT tablet Take 1 tablet by mouth daily. 10/25/20   [provider]  calcium-vitamin D (OSCAL WITH D) 500-200 MG-UNIT tablet Take 1 tablet by mouth daily. Patient not taking: Reported on 02/07/2021 10/26/20   Angiulli, Lavon Paganini, PA-C  celecoxib (CELEBREX) 200 MG capsule TAKE 1 CAPSULE BY MOUTH TWO TIMES DAILY FOR 5 DAYS Patient not taking: Reported on 02/07/2021 09/19/20 09/19/21  Eustace Moore, MD  diclofenac (VOLTAREN) 50 MG EC tablet TAKE 1 TABLET BY MOUTH 3 TIMES DAILY AS NEEDED. 01/15/21 01/15/22  Lovorn, Jinny Blossom, MD  hydrochlorothiazide (HYDRODIURIL) 12.5 MG tablet     [provider]  HYDROcodone-acetaminophen (NORCO/VICODIN) 5-325 MG tablet TAKE 1 TO 2 TABLETS EVERY 4 TO 6 HOURS AS NEEDED FOR PAIN. 02/07/21     levothyroxine (SYNTHROID) 100 MCG tablet Take 100 mcg by mouth daily before breakfast.  08/27/19   [provider]  losartan (COZAAR) 50 MG tablet Take 50 mg by mouth daily.    [provider]  naloxegol oxalate (MOVANTIK) 25 MG TABS tablet Take 1 tablet (25 mg total) by mouth daily. Patient not taking: Reported on 02/07/2021 11/23/20   Courtney Heys, MD  naltrexone (DEPADE) 50 MG  tablet     [provider]  pantoprazole (PROTONIX) 40 MG tablet TAKE 1 TABLET BY MOUTH ONCE DAILY 08/21/20 08/21/21  Tama High III, MD  rivaroxaban (XARELTO) 20 MG TABS tablet TAKE 1 TABLET (20 MG TOTAL) BY MOUTH DAILY WITH SUPPER. Patient not taking: Reported on 02/07/2021 11/23/20 11/23/21  Courtney Heys, MD  tapentadol HCl (NUCYNTA) 75 MG tablet TAKE 1 TABLET BY MOUTH EVERY 6 (SIX) HOURS AS NEEDED FOR MODERATE PAIN 09/19/20 03/18/21  Eustace Moore, MD  Vitamin D, Ergocalciferol, (DRISDOL) 1.25 MG (50000 UNIT) CAPS capsule Take 1 capsule (50,000 Units total) by mouth once a week 01/30/21       Family History History reviewed. No pertinent family history.  Social History Social History   Tobacco Use  . Smoking status: Never Smoker  . Smokeless tobacco: Never Used  Vaping Use  . Vaping Use: Never used  Substance Use Topics  . Alcohol use: Not Currently  . Drug use: Never     Allergies   Patient  has no known allergies.   Review of Systems Review of Systems PER HPI    Physical Exam Triage Vital Signs ED Triage Vitals  Enc Vitals Group     BP 02/21/21 1021 133/85     Pulse Rate 02/21/21 1021 60     Resp 02/21/21 1021 18     Temp 02/21/21 1021 98.9 F (37.2 C)     Temp Source 02/21/21 1021 Oral     SpO2 02/21/21 1021 95 %     Weight --      Height --      Head Circumference --      Peak Flow --      Pain Score 02/21/21 1022 0     Pain Loc --      Pain Edu? --      Excl. in [place]? --    No data found.  Updated Vital Signs BP 133/85 (BP Location: Left Arm)   Pulse 60   Temp 98.9 F (37.2 C) (Oral)   Resp 18   SpO2 95%   Visual Acuity Right Eye Distance:   Left Eye Distance:   Bilateral Distance:    Right Eye Near:   Left Eye Near:    Bilateral Near:     Physical Exam Vitals and nursing note reviewed.  Constitutional:      Appearance: Normal appearance.  HENT:     Head: Atraumatic.  Eyes:     Extraocular Movements: Extraocular movements  intact.     Conjunctiva/sclera: Conjunctivae normal.  Cardiovascular:     Rate and Rhythm: Normal rate and regular rhythm.  Pulmonary:     Effort: Pulmonary effort is normal.     Breath sounds: Normal breath sounds.  Musculoskeletal:        General: Normal range of motion.     Cervical back: Normal range of motion and neck supple.  Skin:    General: Skin is warm and dry.     Findings: Rash present.     Comments: Erythematous circumscribed maculopapular rash with some areas of fine blistering sporadically across body including b/l upper inner thighs, upper arms, neck, face, right lower abdomen  Neurological:     General: No focal deficit present.     Mental Status: He is oriented to person, place, and time.  Psychiatric:        Mood and Affect: Mood normal.        Thought Content: Thought content normal.        Judgment: Judgment normal.      UC Treatments / Results  Labs (all labs ordered are listed, but only abnormal results are displayed) Labs Reviewed - No data to display  EKG   Radiology No results found.  Procedures Procedures (including critical care time)  Medications Ordered in UC Medications - No data to display  Initial Impression / Assessment and Plan / UC Course  I have reviewed the triage vital signs and the nursing notes.  Pertinent labs & imaging results that were available during my care of the patient were reviewed by me and considered in my medical decision making (see chart for details).     Does appear most consistent with allergic dermatitis despite lack of known exposure to something of this nature, and given continued spread and lack of response to topical steroids will start extended prednisone taper but also give ketoconazole cream for fungal coverage for r/o. Urgent Dermatology referral placed per patient request for follow up in case not resolving  as he is to have surgery in very near future and needs rash resolved prior to. Strict return  precautions given for acutely worsening sxs.   Final Clinical Impressions(s) / UC Diagnoses   Final diagnoses:  Rash and nonspecific skin eruption   Discharge Instructions   None    ED Prescriptions    Medication Sig Dispense Auth. Provider   predniSONE (DELTASONE) 10 MG tablet Take 6 tabs daily x 2 days, 5 tabs daily x 2 days, 4 tabs daily x 2 days, etc 42 tablet Volney American, PA-C   ketoconazole (NIZORAL) 2 % cream Apply 1 application topically 2 (two) times daily as needed for irritation. 60 g Volney American, [place]     PDMP not reviewed this encounter.   Merrie Roof [place], [place] 02/25/21 [phone]

## 2021-02-26 ENCOUNTER — Other Ambulatory Visit: Payer: Self-pay | Admitting: Internal Medicine

## 2021-02-26 ENCOUNTER — Other Ambulatory Visit: Payer: Self-pay

## 2021-02-27 ENCOUNTER — Other Ambulatory Visit (HOSPITAL_COMMUNITY): Payer: Self-pay

## 2021-02-27 ENCOUNTER — Ambulatory Visit: Payer: 59 | Admitting: Physical Therapy

## 2021-02-27 ENCOUNTER — Encounter: Payer: 59 | Admitting: Occupational Therapy

## 2021-02-27 ENCOUNTER — Other Ambulatory Visit: Payer: Self-pay

## 2021-02-27 DIAGNOSIS — I10 Essential (primary) hypertension: Secondary | ICD-10-CM | POA: Diagnosis not present

## 2021-02-27 DIAGNOSIS — Z6826 Body mass index (BMI) 26.0-26.9, adult: Secondary | ICD-10-CM | POA: Diagnosis not present

## 2021-02-27 DIAGNOSIS — M4310 Spondylolisthesis, site unspecified: Secondary | ICD-10-CM | POA: Diagnosis not present

## 2021-02-27 MED ORDER — LOSARTAN POTASSIUM 50 MG PO TABS
1.0000 | ORAL_TABLET | Freq: Every day | ORAL | 1 refills | Status: DC
  Filled 2021-02-27: qty 90, 90d supply, fill #0
  Filled 2021-05-22: qty 90, 90d supply, fill #1

## 2021-03-01 ENCOUNTER — Other Ambulatory Visit: Payer: Self-pay

## 2021-03-01 MED ORDER — QC ALCOHOL SWABS 70 % PADS
MEDICATED_PAD | 2 refills | Status: AC
  Filled 2021-03-01: qty 100, 90d supply, fill #0

## 2021-03-01 MED ORDER — BD PEN NEEDLE NANO 2ND GEN 32G X 4 MM MISC
2 refills | Status: AC
  Filled 2021-03-01: qty 100, 30d supply, fill #0
  Filled 2021-03-26: qty 100, 30d supply, fill #1
  Filled 2021-09-27: qty 100, 30d supply, fill #2

## 2021-03-02 ENCOUNTER — Ambulatory Visit: Payer: 59 | Admitting: Physical Therapy

## 2021-03-02 ENCOUNTER — Encounter: Payer: 59 | Admitting: Occupational Therapy

## 2021-03-02 ENCOUNTER — Other Ambulatory Visit: Payer: Self-pay

## 2021-03-02 ENCOUNTER — Encounter: Payer: Self-pay | Admitting: Physical Therapy

## 2021-03-02 DIAGNOSIS — R2681 Unsteadiness on feet: Secondary | ICD-10-CM | POA: Diagnosis not present

## 2021-03-02 DIAGNOSIS — G8929 Other chronic pain: Secondary | ICD-10-CM | POA: Diagnosis not present

## 2021-03-02 DIAGNOSIS — M6281 Muscle weakness (generalized): Secondary | ICD-10-CM

## 2021-03-02 DIAGNOSIS — M545 Low back pain, unspecified: Secondary | ICD-10-CM

## 2021-03-02 NOTE — Therapy (Signed)
[facility] [address] [place], [place], [address] Phone: [phone]   Fax:  [phone]  Physical Therapy Treatment  Patient Details  Name: Alexander Rojas MRN: [id number] Date of Birth: [birth year] Referring Provider (PT): Courtney Heys, MD   Encounter Date: 03/02/2021   PT End of Session - 03/02/21 0820    Visit Number 6    Number of Visits 13    Authorization Type [name]    PT Start Time [phone]    PT Stop Time 0803    PT Time Calculation (min) 44 min    Activity Tolerance Patient tolerated treatment well    Behavior During Therapy [facility] for tasks assessed/performed           Past Medical History:  Diagnosis Date  . Back pain   . BPH (benign prostatic hyperplasia)   . Extensor tendon laceration, finger, open wound, initial encounter    left thumb  . Hypertension   . Hypothyroidism   . Macrocytosis   . Numbness and tingling   . Pars defect with spondylolisthesis     Past Surgical History:  Procedure Laterality Date  . ABDOMINAL EXPOSURE N/A 09/19/2020   Procedure: ABDOMINAL EXPOSURE;  Surgeon: Rosetta Posner, MD;  Location: [facility] OR;  Service: Vascular;  Laterality: N/A;  anterior  . ANTERIOR LUMBAR FUSION N/A 09/19/2020   Procedure: Anterior Lumbar Interbody Fusion Lumbar Five-Sacral One, posterior lateral fusion with gill decompression Lumbar Five-Sacral One.;  Surgeon: Eustace Moore, MD;  Location: [place];  Service: Neurosurgery;  Laterality: N/A;  anterior  . APPLICATION OF INTRAOPERATIVE CT SCAN N/A 09/27/2020   Procedure: APPLICATION OF INTRAOPERATIVE CT SCAN;  Surgeon: Eustace Moore, MD;  Location: [place];  Service: Neurosurgery;  Laterality: N/A;  . COLONOSCOPY WITH PROPOFOL N/A 12/18/2018   Procedure: COLONOSCOPY WITH BIOPSY;  Surgeon: Lucilla Lame, MD;  Location: [place];  Service: Endoscopy;  Laterality: N/A;  NEEDS TO STAY FIRST  . ESOPHAGOGASTRODUODENOSCOPY    .  ESOPHAGOGASTRODUODENOSCOPY (EGD) WITH PROPOFOL N/A 02/23/2019   Procedure: ESOPHAGOGASTRODUODENOSCOPY (EGD) WITH PROPOFOL;  Surgeon: Lucilla Lame, MD;  Location: [facility] ENDOSCOPY;  Service: Endoscopy;  Laterality: N/A;  . HERNIA REPAIR    . LAMINECTOMY WITH POSTERIOR LATERAL ARTHRODESIS LEVEL 1 N/A 09/19/2020   Procedure: LAMINECTOMY WITH POSTERIOR LATERAL ARTHRODESIS LEVEL 1;  Surgeon: Eustace Moore, MD;  Location: [place];  Service: Neurosurgery;  Laterality: N/A;  posterior  . LAMINECTOMY WITH POSTERIOR LATERAL ARTHRODESIS LEVEL 2 N/A 09/27/2020   Procedure: Lumbar exploration with Extension of Posterior Instrumentation Lumbar Four to the Ilium with Airo;  Surgeon: Eustace Moore, MD;  Location: [place];  Service: Neurosurgery;  Laterality: N/A;  . LIPOMA EXCISION Right    upper arm  . POLYPECTOMY N/A 12/18/2018   Procedure: POLYPECTOMY;  Surgeon: Lucilla Lame, MD;  Location: [place];  Service: Endoscopy;  Laterality: N/A;  . TENDON REPAIR Left 11/05/2019   Procedure: LEFT THUMB TENDON REPAIR;  Surgeon: Cindra Presume, MD;  Location: [place];  Service: Plastics;  Laterality: Left;  Axillary block, MAC  . UMBILICAL HERNIA REPAIR      There were no vitals filed for this visit.   Subjective Assessment - 03/02/21 0720    Subjective Started a new subcutaneous medication for his osteoporosis. Rash has improved. Been a rough week for the back - not really sure why. Was sitting in a chair for 20 minutes at the pharmacy and pain  was so bad and it was extremely painful. This morning it is doing ok. Reports his fingers are feeling more numb and having more trouble buttoning. Wants to wait for aquatic therapy until his pool opens.    Pertinent History PMH:  HTN, osteopenia    Limitations Walking;Standing    Diagnostic tests MRI lumbar spine 11/04/20; 1. Posterior fusion spanning L4-S1. No significant residual canal  stenosis.  2. Persistent anterolisthesis of L5 on S1 with similar  moderate  bilateral foraminal stenosis at L5-S1.  3. Mild to moderate right subarticular recess stenosis at L4-L5.    Patient Stated Goals wants to make his back pain better, build up strength and stamina.    Currently in Pain? Yes    Pain Score 2     Pain Location Back    Pain Orientation Lower    Pain Descriptors / Indicators Aching    Pain Type Chronic pain    Pain Onset More than a month ago    Aggravating Factors  nothing    Pain Relieving Factors not really                             [facility] Adult PT Treatment/Exercise - 03/02/21 0739      Exercises   Exercises Other Exercises    Other Exercises  standing at countertop with wider BOS - shifting hips posteriorly for low back/hamstring stretch and bringing pelvis forwards towards countertop x10 reps      Lumbar Exercises: Seated   Other Seated Lumbar Exercises seated on blue physioball: x10 reps lateral weight shifting through hips, x10 reps A/P pelvic tilts      Lumbar Exercises: Supine   Dead Bug Limitations;10 reps    Dead Bug Limitations cues for coordination and core activation    Straight Leg Raise 10 reps;Limitations    Straight Leg Raises Limitations performed x10 reps B, initial cues for proper technique      Knee/Hip Exercises: Stretches   Passive Hamstring Stretch Both;3 reps;30 seconds;Limitations    Passive Hamstring Stretch Limitations pt supine on mat table with therapist performing stretch - performed MET to pt's tolerance      Knee/Hip Exercises: Aerobic   Nustep with BLE only at gear 4 for 5 minutes for strengthening, ROM.      Knee/Hip Exercises: Supine   Bridges Strengthening;Both;1 set;10 reps   initial cues for technique   Bridges with [employer] Strengthening;Both;5 reps   with yoga block   Other Supine Knee/Hip Exercises hip ADD with yoga block x10 reps, pt in hooklying position                  PT Education - 03/02/21 0818    Education Details while seated for longer  periods of time - pt can try gentle rocking side to side weight shifting or gentle A/P pelvic tilts for lumbar ROM    Person(s) Educated Patient    Methods Explanation;Demonstration    Comprehension Verbalized understanding;Returned demonstration            PT Short Term Goals - 02/13/21 1157      PT SHORT TERM GOAL #1   Title Pt will initiate aquatic therapy in order to perform HEP at home in his pool. ALL STGS DUE 03/09/21    Time 4    Period Weeks    Status New    Target Date 03/09/21      PT SHORT TERM GOAL #  2   Title Will perform ODI with LTG written as appropriate.    Baseline 40% = moderate disability, LTG written    Time 4    Period Weeks    Status Achieved      PT SHORT TERM GOAL #3   Title Perform 6MWT and LTG to be written as appropriate to demo improved endurance/stamina.    Time 4    Period Weeks    Status New      PT SHORT TERM GOAL #4   Title Pt will be independent with initial HEP in therapy (land based) in order to build upon functional gains.    Time 4    Period Weeks    Status New             PT Long Term Goals - 02/13/21 1206      PT LONG TERM GOAL #1   Title Pt will decr ODI score to 30% or less in order to demo decr disability due to low back pain.ALL LTGS DUE 04/06/21    Baseline 40% on 02/13/21    Time 8    Period Weeks    Status Revised      PT LONG TERM GOAL #2   Title Pt will be independent with final HEP in therapy (land based) in order to build upon functional gains.    Time 8    Period Weeks    Status New      PT LONG TERM GOAL #3   Title 6MWT goal to be written as appropriate in order to demo improved endurance.    Time 8    Period Weeks    Status New      PT LONG TERM GOAL #4   Title Pt will improve R hip ABD strength to at least a 4+/5 in order to help improve gait mechanics.    Baseline 4-/5    Time 8    Period Weeks    Status New      PT LONG TERM GOAL #5   Title Pt will report low back pain to a 2/10 or less in  order to improve functional moiblity.    Baseline 4/10 (having a good day at eval)    Time 8    Period Weeks    Status New                 Plan - 03/02/21 2119    Clinical Impression Statement Continued to focus on core strengthening in supine and seated, BLE strengthing as well as hamstring stretching (MET from therapist and more dynamic movement at countertop). Pt tolerated session well today. Did not have as much pain today going from sit > supine. Will continue to progress towards LTGs.    Personal Factors and Comorbidities Comorbidity 2;Profession;Time since onset of injury/illness/exacerbation    Comorbidities HTN, osteopenia    Examination-Activity Limitations Stand;Stairs;Transfers;Locomotion Level   laying supine   Examination-Participation Restrictions Community Activity;Occupation    Stability/Clinical Decision Making Evolving/Moderate complexity    Rehab Potential Good    PT Frequency 2x / week   followed by 1x week for 4 weeks   PT Duration 8 weeks   followed by 1x week for 4 weeks   PT Treatment/Interventions ADLs/Self Care Home Management;Aquatic Therapy;Gait training;Therapeutic activities;Neuromuscular re-education;Balance training;Therapeutic exercise;Patient/family education;Manual techniques;Passive range of motion;Vestibular;Electrical Stimulation    PT Next Visit Plan Continue core strenghening and flexibility task and hip strengthening, PF strength, pursue gentle stretch/strength tasks, seated balance on compliant  surfaces    Consulted and Agree with Plan of Care Patient           Patient will benefit from skilled therapeutic intervention in order to improve the following deficits and impairments:  Abnormal gait,Decreased mobility,Decreased range of motion,Decreased strength,Hypomobility,Decreased coordination,Impaired flexibility,Pain,Postural dysfunction  Visit Diagnosis: Muscle weakness (generalized)  Chronic bilateral low back pain without  sciatica     Problem List Patient Active Problem List   Diagnosis Date Noted  . Ulnar nerve entrapment syndrome, left 12/11/2020  . Abnormality of gait 12/11/2020  . Hypocalcemia 12/08/2020  . DVT (deep venous thrombosis) ([place]) 12/08/2020  . Post-operative pain   . History of hypertension   . Acute lower UTI   . Leukocytosis   . Dysuria   . Drug induced constipation   . Neuropathic pain   . Lumbar radiculopathy 10/10/2020  . 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 S/P lumbar fusion 09/19/2020  . Benign prostatic hyperplasia with urinary hesitancy 05/09/2017  . Tongue burning sensation 04/18/2016  . Essential hypertension 10/06/2014  . Hypothyroidism due to acquired atrophy of thyroid 10/06/2014    Arliss Journey, PT, DPT  03/02/2021, 8:37 AM  [facility] [address] [place], [place], [address] Phone: [phone]   Fax:  [phone]  Name: ALOK MINSHALL MRN: [id number] Date of Birth: May 05, 1956

## 2021-03-06 ENCOUNTER — Other Ambulatory Visit (HOSPITAL_COMMUNITY): Payer: Self-pay

## 2021-03-06 ENCOUNTER — Encounter: Payer: Self-pay | Admitting: Physical Therapy

## 2021-03-06 ENCOUNTER — Other Ambulatory Visit: Payer: Self-pay

## 2021-03-06 ENCOUNTER — Encounter: Payer: 59 | Admitting: Occupational Therapy

## 2021-03-06 ENCOUNTER — Ambulatory Visit: Payer: 59 | Admitting: Physical Therapy

## 2021-03-06 DIAGNOSIS — G8929 Other chronic pain: Secondary | ICD-10-CM | POA: Diagnosis not present

## 2021-03-06 DIAGNOSIS — M545 Low back pain, unspecified: Secondary | ICD-10-CM | POA: Diagnosis not present

## 2021-03-06 DIAGNOSIS — M6281 Muscle weakness (generalized): Secondary | ICD-10-CM | POA: Diagnosis not present

## 2021-03-06 DIAGNOSIS — R2681 Unsteadiness on feet: Secondary | ICD-10-CM | POA: Diagnosis not present

## 2021-03-06 NOTE — Therapy (Signed)
[name], [place], [address] Phone: [phone]   Fax:  [phone]  Physical Therapy Treatment  Patient Details  Name: Alexander Rojas MRN: [id number] Date of Birth: 02/02/1956 Referring Provider (PT): Courtney Heys, MD   Encounter Date: 03/06/2021   PT End of Session - 03/06/21 1111    Visit Number 7    Number of Visits 13    Authorization Type [name]    PT Start Time [phone]    PT Stop Time 1013    PT Time Calculation (min) 42 min    Activity Tolerance Patient tolerated treatment well    Behavior During Therapy [facility] for tasks assessed/performed           Past Medical History:  Diagnosis Date  . Back pain   . BPH (benign prostatic hyperplasia)   . Extensor tendon laceration, finger, open wound, initial encounter    left thumb  . Hypertension   . Hypothyroidism   . Macrocytosis   . Numbness and tingling   . Pars defect with spondylolisthesis     Past Surgical History:  Procedure Laterality Date  . ABDOMINAL EXPOSURE N/A 09/19/2020   Procedure: ABDOMINAL EXPOSURE;  Surgeon: Rosetta Posner, MD;  Location: [facility] OR;  Service: Vascular;  Laterality: N/A;  anterior  . ANTERIOR LUMBAR FUSION N/A 09/19/2020   Procedure: Anterior Lumbar Interbody Fusion Lumbar Five-Sacral One, posterior lateral fusion with gill decompression Lumbar Five-Sacral One.;  Surgeon: Eustace Moore, MD;  Location: [place];  Service: Neurosurgery;  Laterality: N/A;  anterior  . APPLICATION OF INTRAOPERATIVE CT SCAN N/A 09/27/2020   Procedure: APPLICATION OF INTRAOPERATIVE CT SCAN;  Surgeon: Eustace Moore, MD;  Location: [place];  Service: Neurosurgery;  Laterality: N/A;  . COLONOSCOPY WITH PROPOFOL N/A 12/18/2018   Procedure: COLONOSCOPY WITH BIOPSY;  Surgeon: Lucilla Lame, MD;  Location: [place];  Service: Endoscopy;  Laterality: N/A;  NEEDS TO STAY FIRST  . ESOPHAGOGASTRODUODENOSCOPY    .  ESOPHAGOGASTRODUODENOSCOPY (EGD) WITH PROPOFOL N/A 02/23/2019   Procedure: ESOPHAGOGASTRODUODENOSCOPY (EGD) WITH PROPOFOL;  Surgeon: Lucilla Lame, MD;  Location: [facility] ENDOSCOPY;  Service: Endoscopy;  Laterality: N/A;  . HERNIA REPAIR    . LAMINECTOMY WITH POSTERIOR LATERAL ARTHRODESIS LEVEL 1 N/A 09/19/2020   Procedure: LAMINECTOMY WITH POSTERIOR LATERAL ARTHRODESIS LEVEL 1;  Surgeon: Eustace Moore, MD;  Location: [place];  Service: Neurosurgery;  Laterality: N/A;  posterior  . LAMINECTOMY WITH POSTERIOR LATERAL ARTHRODESIS LEVEL 2 N/A 09/27/2020   Procedure: Lumbar exploration with Extension of Posterior Instrumentation Lumbar Four to the Ilium with Airo;  Surgeon: Eustace Moore, MD;  Location: [place];  Service: Neurosurgery;  Laterality: N/A;  . LIPOMA EXCISION Right    upper arm  . POLYPECTOMY N/A 12/18/2018   Procedure: POLYPECTOMY;  Surgeon: Lucilla Lame, MD;  Location: [place];  Service: Endoscopy;  Laterality: N/A;  . TENDON REPAIR Left 11/05/2019   Procedure: LEFT THUMB TENDON REPAIR;  Surgeon: Cindra Presume, MD;  Location: [place];  Service: Plastics;  Laterality: Left;  Axillary block, MAC  . UMBILICAL HERNIA REPAIR      There were no vitals filed for this visit.   Subjective Assessment - 03/06/21 0933    Subjective Reports back has been hurting much more the past few days.  Today is a bad day. Has a hard time bending over - has a reacher in every room of his house.  Pertinent History PMH:  HTN, osteopenia    Limitations Walking;Standing    Diagnostic tests MRI lumbar spine 11/04/20; 1. Posterior fusion spanning L4-S1. No significant residual canal  stenosis.  2. Persistent anterolisthesis of L5 on S1 with similar moderate  bilateral foraminal stenosis at L5-S1.  3. Mild to moderate right subarticular recess stenosis at L4-L5.    Patient Stated Goals wants to make his back pain better, build up strength and stamina.    Currently in Pain? No/denies   no  pain when sitting down, when walking into the clinic was a 4/10                            [facility] Adult PT Treatment/Exercise - 03/06/21 1005      Lumbar Exercises: Seated   Other Seated Lumbar Exercises on blue physioball: lateral weight shifting x10 reps B, scap retraction -cues for technique and core activation, posterior pelvic tilt x10 reps cues for technique, alternating marching x8 reps B, alternating LAQs x8 reps B- incr difficulty with balance     Lumbar Exercises: Supine   Heel Slides 10 reps    Heel Slides Limitations cues for slowed and controlled and core activation    Other Supine Lumbar Exercises x10 reps B quad sets with x3 second holds, cues for gentle posterior pelvic tilt when performing      Knee/Hip Exercises: Stretches   Passive Hamstring Stretch Both;1 rep;30 seconds;Limitations    Passive Hamstring Stretch Limitations seated at edge of mat - reviewed proper technique from HEP, cued to perform one leg at a time vs. both legs at once    Other Knee/Hip Stretches supine single knee to chest stretch from therapist 2 x 30 seconds with RLE, R hip external rotation stretch 3 x 30 and R internal rotation stretch 3 x 30 seconds to pt's tolerance      Knee/Hip Exercises: Aerobic   Nustep with BLE only at gear 5 for 6 minutes for strengthening, ROM, aerobic warmup.                  PT Education - 03/06/21 1111    Education Details rescheduling friday's appt to a different therapist due to provider being out.    Person(s) Educated Patient    Methods Explanation    Comprehension Verbalized understanding            PT Short Term Goals - 03/06/21 1114      PT SHORT TERM GOAL #1   Title Pt will initiate aquatic therapy in order to perform HEP at home in his pool. ALL STGS DUE 03/09/21    Baseline pt wants to wait to start aquatic therapy    Time 4    Period Weeks    Status Not Met    Target Date 03/09/21      PT SHORT TERM GOAL #2   Title  Will perform ODI with LTG written as appropriate.    Baseline 40% = moderate disability, LTG written    Time 4    Status Achieved      PT SHORT TERM GOAL #3   Title Perform 6MWT and LTG to be written as appropriate to demo improved endurance/stamina.    Time 4    Period Weeks    Status New      PT SHORT TERM GOAL #4   Title Pt will be independent with initial HEP in therapy (land based) in order to  build upon functional gains.    Time 4    Period Weeks    Status --             PT Long Term Goals - 02/13/21 1206      PT LONG TERM GOAL #1   Title Pt will decr ODI score to 30% or less in order to demo decr disability due to low back pain.ALL LTGS DUE 04/06/21    Baseline 40% on 02/13/21    Time 8    Period Weeks    Status Revised      PT LONG TERM GOAL #2   Title Pt will be independent with final HEP in therapy (land based) in order to build upon functional gains.    Time 8    Period Weeks    Status New      PT LONG TERM GOAL #3   Title 6MWT goal to be written as appropriate in order to demo improved endurance.    Time 8    Period Weeks    Status New      PT LONG TERM GOAL #4   Title Pt will improve R hip ABD strength to at least a 4+/5 in order to help improve gait mechanics.    Baseline 4-/5    Time 8    Period Weeks    Status New      PT LONG TERM GOAL #5   Title Pt will report low back pain to a 2/10 or less in order to improve functional moiblity.    Baseline 4/10 (having a good day at eval)    Time 8    Period Weeks    Status New                 Plan - 03/06/21 1120    Clinical Impression Statement Pt's low back hurting more today, no change at end of session. Continued to focus on BLE strengthening, gentle lumbar/hip ROM, and core strengthening in seated on physioball and supine. Pt challenged today by alternating marching/LAQs on physioball, needing UE support and incr difficulty when lifting up RLE. Will continue to progress towards LTGs.     Personal Factors and Comorbidities Comorbidity 2;Profession;Time since onset of injury/illness/exacerbation    Comorbidities HTN, osteopenia    Examination-Activity Limitations Stand;Stairs;Transfers;Locomotion Level   laying supine   Examination-Participation Restrictions Community Activity;Occupation    Stability/Clinical Decision Making Evolving/Moderate complexity    Rehab Potential Good    PT Frequency 2x / week   followed by 1x week for 4 weeks   PT Duration 8 weeks   followed by 1x week for 4 weeks   PT Treatment/Interventions ADLs/Self Care Home Management;Aquatic Therapy;Gait training;Therapeutic activities;Neuromuscular re-education;Balance training;Therapeutic exercise;Patient/family education;Manual techniques;Passive range of motion;Vestibular;Electrical Stimulation    PT Next Visit Plan Continue core strenghening, gentle lumbar/hip ROM, hip strengthening (extensors/ABD), PF strength, seated balance on compliant surfaces (try physioball again).    Consulted and Agree with Plan of Care Patient           Patient will benefit from skilled therapeutic intervention in order to improve the following deficits and impairments:  Abnormal gait,Decreased mobility,Decreased range of motion,Decreased strength,Hypomobility,Decreased coordination,Impaired flexibility,Pain,Postural dysfunction  Visit Diagnosis: Muscle weakness (generalized)  Chronic bilateral low back pain without sciatica     Problem List Patient Active Problem List   Diagnosis Date Noted  . Ulnar nerve entrapment syndrome, left 12/11/2020  . Abnormality of gait 12/11/2020  . Hypocalcemia 12/08/2020  . DVT (deep venous  thrombosis) ([place]) 12/08/2020  . Post-operative pain   . History of hypertension   . Acute lower UTI   . Leukocytosis   . Dysuria   . Drug induced constipation   . Neuropathic pain   . Lumbar radiculopathy 10/10/2020  . 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 S/P lumbar  fusion 09/19/2020  . Benign prostatic hyperplasia with urinary hesitancy 05/09/2017  . Tongue burning sensation 04/18/2016  . Essential hypertension 10/06/2014  . Hypothyroidism due to acquired atrophy of thyroid 10/06/2014    Arliss Journey, PT, DPT  03/06/2021, 11:23 AM  [place] [address] [place], [place], [address] Phone: [phone]   Fax:  [phone]  Name: NIKOLOS BILLIG MRN: [id number] Date of Birth: [birth year]

## 2021-03-09 ENCOUNTER — Ambulatory Visit: Payer: 59 | Admitting: Physical Therapy

## 2021-03-09 ENCOUNTER — Other Ambulatory Visit: Payer: Self-pay

## 2021-03-09 ENCOUNTER — Ambulatory Visit: Payer: 59 | Admitting: Occupational Therapy

## 2021-03-09 ENCOUNTER — Ambulatory Visit: Payer: 59

## 2021-03-09 DIAGNOSIS — G8929 Other chronic pain: Secondary | ICD-10-CM | POA: Diagnosis not present

## 2021-03-09 DIAGNOSIS — M545 Low back pain, unspecified: Secondary | ICD-10-CM | POA: Diagnosis not present

## 2021-03-09 DIAGNOSIS — M6281 Muscle weakness (generalized): Secondary | ICD-10-CM | POA: Diagnosis not present

## 2021-03-09 DIAGNOSIS — R2681 Unsteadiness on feet: Secondary | ICD-10-CM

## 2021-03-09 NOTE — Therapy (Signed)
[place] [name] [place], [place], [address] Phone: [phone]   Fax:  [phone]  Physical Therapy Treatment  Patient Details  Name: Alexander Rojas MRN: [id number] Date of Birth: [birth year] Referring Provider (PT): Courtney Heys, MD   Encounter Date: 03/09/2021   PT End of Session - 03/09/21 0856    Visit Number 8    Number of Visits 13    Authorization Type [employer] Employee    PT Start Time 0800    PT Stop Time 0850    PT Time Calculation (min) 50 min    Activity Tolerance Patient tolerated treatment well    Behavior During Therapy [facility] for tasks assessed/performed           Past Medical History:  Diagnosis Date  . Back pain   . BPH (benign prostatic hyperplasia)   . Extensor tendon laceration, finger, open wound, initial encounter    left thumb  . Hypertension   . Hypothyroidism   . Macrocytosis   . Numbness and tingling   . Pars defect with spondylolisthesis     Past Surgical History:  Procedure Laterality Date  . ABDOMINAL EXPOSURE N/A 09/19/2020   Procedure: ABDOMINAL EXPOSURE;  Surgeon: Rosetta Posner, MD;  Location: [facility] OR;  Service: Vascular;  Laterality: N/A;  anterior  . ANTERIOR LUMBAR FUSION N/A 09/19/2020   Procedure: Anterior Lumbar Interbody Fusion Lumbar Five-Sacral One, posterior lateral fusion with gill decompression Lumbar Five-Sacral One.;  Surgeon: Eustace Moore, MD;  Location: [place];  Service: Neurosurgery;  Laterality: N/A;  anterior  . APPLICATION OF INTRAOPERATIVE CT SCAN N/A 09/27/2020   Procedure: APPLICATION OF INTRAOPERATIVE CT SCAN;  Surgeon: Eustace Moore, MD;  Location: [name];  Service: Neurosurgery;  Laterality: N/A;  . COLONOSCOPY WITH PROPOFOL N/A 12/18/2018   Procedure: COLONOSCOPY WITH BIOPSY;  Surgeon: Lucilla Lame, MD;  Location: [place];  Service: Endoscopy;  Laterality: N/A;  NEEDS TO STAY FIRST  . ESOPHAGOGASTRODUODENOSCOPY    .  ESOPHAGOGASTRODUODENOSCOPY (EGD) WITH PROPOFOL N/A 02/23/2019   Procedure: ESOPHAGOGASTRODUODENOSCOPY (EGD) WITH PROPOFOL;  Surgeon: Lucilla Lame, MD;  Location: [facility] ENDOSCOPY;  Service: Endoscopy;  Laterality: N/A;  . HERNIA REPAIR    . LAMINECTOMY WITH POSTERIOR LATERAL ARTHRODESIS LEVEL 1 N/A 09/19/2020   Procedure: LAMINECTOMY WITH POSTERIOR LATERAL ARTHRODESIS LEVEL 1;  Surgeon: Eustace Moore, MD;  Location: [place];  Service: Neurosurgery;  Laterality: N/A;  posterior  . LAMINECTOMY WITH POSTERIOR LATERAL ARTHRODESIS LEVEL 2 N/A 09/27/2020   Procedure: Lumbar exploration with Extension of Posterior Instrumentation Lumbar Four to the Ilium with Airo;  Surgeon: Eustace Moore, MD;  Location: [place];  Service: Neurosurgery;  Laterality: N/A;  . LIPOMA EXCISION Right    upper arm  . POLYPECTOMY N/A 12/18/2018   Procedure: POLYPECTOMY;  Surgeon: Lucilla Lame, MD;  Location: [place];  Service: Endoscopy;  Laterality: N/A;  . TENDON REPAIR Left 11/05/2019   Procedure: LEFT THUMB TENDON REPAIR;  Surgeon: Cindra Presume, MD;  Location: [place];  Service: Plastics;  Laterality: Left;  Axillary block, MAC  . UMBILICAL HERNIA REPAIR      There were no vitals filed for this visit.   Subjective Assessment - 03/09/21 0855    Subjective reporting continued thoracic and rib cage pai, spasm tightness, has limited ability to bens, reac and twist    Pertinent History PMH:  HTN, osteopenia    Limitations Walking;Standing    Diagnostic tests  MRI lumbar spine 11/04/20; 1. Posterior fusion spanning L4-S1. No significant residual canal  stenosis.  2. Persistent anterolisthesis of L5 on S1 with similar moderate  bilateral foraminal stenosis at L5-S1.  3. Mild to moderate right subarticular recess stenosis at L4-L5.    Patient Stated Goals wants to make his back pain better, build up strength and stamina.    Pain Onset More than a month ago          Todays treatment consisted of  assessment of contributing factors to pain followed by supine stretching to low back in hooklie position for LTR 2' hold time 2x, SKTC with PT assist 2' hold time x2 B, LTR across physioball 2' hold 2x B, seated thoracic flexion with PT assist 60s hold followed by MWM into thoracic extension.  Instructed in R lateral shift and thoracic extension mob to perform at home                             PT Short Term Goals - 03/06/21 1114      PT SHORT TERM GOAL #1   Title Pt will initiate aquatic therapy in order to perform HEP at home in his pool. ALL STGS DUE 03/09/21    Baseline pt wants to wait to start aquatic therapy    Time 4    Period Weeks    Status Not Met    Target Date 03/09/21      PT SHORT TERM GOAL #2   Title Will perform ODI with LTG written as appropriate.    Baseline 40% = moderate disability, LTG written    Time 4    Status Achieved      PT SHORT TERM GOAL #3   Title Perform 6MWT and LTG to be written as appropriate to demo improved endurance/stamina.    Time 4    Period Weeks    Status New      PT SHORT TERM GOAL #4   Title Pt will be independent with initial HEP in therapy (land based) in order to build upon functional gains.    Time 4    Period Weeks    Status --             PT Long Term Goals - 02/13/21 1206      PT LONG TERM GOAL #1   Title Pt will decr ODI score to 30% or less in order to demo decr disability due to low back pain.ALL LTGS DUE 04/06/21    Baseline 40% on 02/13/21    Time 8    Period Weeks    Status Revised      PT LONG TERM GOAL #2   Title Pt will be independent with final HEP in therapy (land based) in order to build upon functional gains.    Time 8    Period Weeks    Status New      PT LONG TERM GOAL #3   Title 6MWT goal to be written as appropriate in order to demo improved endurance.    Time 8    Period Weeks    Status New      PT LONG TERM GOAL #4   Title Pt will improve R hip ABD strength to at  least a 4+/5 in order to help improve gait mechanics.    Baseline 4-/5    Time 8    Period Weeks    Status New  PT LONG TERM GOAL #5   Title Pt will report low back pain to a 2/10 or less in order to improve functional moiblity.    Baseline 4/10 (having a good day at eval)    Time 8    Period Weeks    Status New                 Plan - 03/09/21 0857    Clinical Impression Statement Patient arrived to session with increased thoracic pain and spasm, demonstrating a lateral shift  Todays skilled session focused on manual therapy techniques to relieve thoarcic pain, spasm and loss of segmental mobility through stretchng and thoracic extension mob techniques and HEP update, pain, ROM and overall mobiity much improved with treatment today    Personal Factors and Comorbidities Comorbidity 2;Profession;Time since onset of injury/illness/exacerbation    Comorbidities HTN, osteopenia    Examination-Activity Limitations Stand;Stairs;Transfers;Locomotion Level   laying supine   Examination-Participation Restrictions Community Activity;Occupation    Stability/Clinical Decision Making Evolving/Moderate complexity    Rehab Potential Good    PT Frequency 2x / week   followed by 1x week for 4 weeks   PT Duration 8 weeks   followed by 1x week for 4 weeks   PT Treatment/Interventions ADLs/Self Care Home Management;Aquatic Therapy;Gait training;Therapeutic activities;Neuromuscular re-education;Balance training;Therapeutic exercise;Patient/family education;Manual techniques;Passive range of motion;Vestibular;Electrical Stimulation    PT Next Visit Plan Assess benefit of new mobilization techniques, continue soft tissue stretching and core stabilization as pain allows    Consulted and Agree with Plan of Care Patient           Patient will benefit from skilled therapeutic intervention in order to improve the following deficits and impairments:  Abnormal gait,Decreased mobility,Decreased range of  motion,Decreased strength,Hypomobility,Decreased coordination,Impaired flexibility,Pain,Postural dysfunction  Visit Diagnosis: Unsteadiness on feet  Muscle weakness (generalized)     Problem List Patient Active Problem List   Diagnosis Date Noted  . Ulnar nerve entrapment syndrome, left 12/11/2020  . Abnormality of gait 12/11/2020  . Hypocalcemia 12/08/2020  . DVT (deep venous thrombosis) ([place]) 12/08/2020  . Post-operative pain   . History of hypertension   . Acute lower UTI   . Leukocytosis   . Dysuria   . Drug induced constipation   . Neuropathic pain   . Lumbar radiculopathy 10/10/2020  . 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 S/P lumbar fusion 09/19/2020  . Benign prostatic hyperplasia with urinary hesitancy 05/09/2017  . Tongue burning sensation 04/18/2016  . Essential hypertension 10/06/2014  . Hypothyroidism due to acquired atrophy of thyroid 10/06/2014    Lanice Shirts PT 03/09/2021, 9:07 AM  [place] [address] [place], [place], [address] Phone: [phone]   Fax:  [phone]  Name: Alexander Rojas MRN: [id number] Date of Birth: [birth year]

## 2021-03-09 NOTE — Patient Instructions (Signed)
Access Code: [email or web address] URL: [email or web address] Date: 03/09/2021 Prepared by: Sharlynn Oliphant  Exercises Seated Hamstring Stretch - 2 x daily - 5 x weekly - 2-3 sets - 20-30 hold Supine Transversus Abdominis Bracing - Hands on Stomach - 2 x daily - 5 x weekly - 2 sets - 10 reps - 5 hold Supine Figure 4 Piriformis Stretch - 2 x daily - 5 x weekly - 2-3 sets - 30 hold Supine Pelvic Tilt - 1-2 x daily - 5 x weekly - 2 sets - 10 reps Heel rises with counter support - 1 x daily - 5 x weekly - 2 sets - 10 reps Forward Backward Monster Walk with Band at Thighs and Counter Support - 1 x daily - 5 x weekly - 2 sets Side Stepping with Resistance at Ankles - 1 x daily - 5 x weekly - 2 sets Supine Figure 4 Piriformis Stretch - 2 x daily - 7 x weekly - 2-3 reps - 30s hold Hooklying Single Leg Bent Knee Fallouts with Resistance - 2 x daily - 7 x weekly - 2 sets - 10 reps Standing Thoracic Extension at Wall - 2 x daily - 7 x weekly - 10 reps Lateral Shift Correction at Wall - 2 x daily - 7 x weekly - 10 reps

## 2021-03-12 ENCOUNTER — Other Ambulatory Visit: Payer: Self-pay

## 2021-03-12 ENCOUNTER — Encounter: Payer: 59 | Attending: Registered Nurse | Admitting: Physical Medicine and Rehabilitation

## 2021-03-12 ENCOUNTER — Encounter: Payer: Self-pay | Admitting: Physical Medicine and Rehabilitation

## 2021-03-12 VITALS — BP 110/76 | HR 67 | Temp 98.8°F | Ht 69.0 in | Wt 185.8 lb

## 2021-03-12 DIAGNOSIS — R269 Unspecified abnormalities of gait and mobility: Secondary | ICD-10-CM

## 2021-03-12 DIAGNOSIS — T84216A Breakdown (mechanical) of internal fixation device of vertebrae, initial encounter: Secondary | ICD-10-CM | POA: Diagnosis not present

## 2021-03-12 DIAGNOSIS — R5383 Other fatigue: Secondary | ICD-10-CM

## 2021-03-12 DIAGNOSIS — G8918 Other acute postprocedural pain: Secondary | ICD-10-CM | POA: Diagnosis not present

## 2021-03-12 DIAGNOSIS — G5622 Lesion of ulnar nerve, left upper limb: Secondary | ICD-10-CM

## 2021-03-12 NOTE — Patient Instructions (Signed)
  Pt is a 65 yr old s/p lumbar fusion for lumbar radiculopathy, sciatic neuropathy and L ulnar neuropathy here for f/u on chronic back issues and L ulnar neuropathy and chronic nerve pain and back pain. .   1. Will check TSH/free T4/free T3 since TSH has been slowly rising.  And can help sometimes keep pain under better control to treat subclinical hypothyroidism. Gal TSH ~ 1.  2. Will renew Low dose naltrexone- for pt- gave 1 week of 3 mg (6 mg dose) to tide him over for pharmacy issues.   3. Patient here for trigger point injections for myofascial pain.   Consent done and on chart.  Cleaned areas with alcohol and injected using a 27 gauge 1.5 inch needle  Injected 1cc Using 1% Lidocaine with no EPI  Upper traps Levators Posterior scalenes Middle scalenes Splenius Capitus Pectoralis Major Rhomboids Infraspinatus Teres Major/minor Thoracic paraspinals Lumbar paraspinals Other injections- R foot 1st dorsal interossei.    Patient's level of pain prior was 0/10- wasn't haven't pain like normally has Current level of pain after injections is 0/10- numb  There was no bleeding or complications.  Patient was advised to drink a lot of water on day after injections to flush system Will have increased soreness for 12-48 hours after injections.  Can use Lidocaine patches the day AFTER injections Can use theracane on day of injections in places didn't inject Can use heating pad 4-6 hours AFTER injections  4. Surgery on L ulnar neuropathy next week.   5. F/U in 6 weeks

## 2021-03-12 NOTE — Progress Notes (Signed)
Subjective:    Patient ID: Alexander Rojas, male    DOB: 01/18/1956, 65 y.o.   MRN: [id number]  HPI Pt is a 65 yr old s/p lumbar fusion for lumbar radiculopathy, sciatic neuropathy and L ulnar neuropathy here for f/u on chronic back issues and L ulnar neuropathy and chronic nerve pain and back pain. .   Things about the same.   Started taking Fosamax- 10 days later after started- stopped dyue to hives.  Were mostly legs and trunk.   Timlos- parathyroid- gives self daily injections- started 1 days ago.   Fingers much worse on L hand - tingling is now almost painful and burning and surgery is now next Friday.  Tingling and burning in L hand is NOW all the time, not just some of the time.   Back pain- hit or miss- nothing makes better/worse unless lifts (worse).  Movements are very calculated and deliberate.  It's irritating to feel that way.   Pain in legs and feet- peroneal nerve- constant- Shooting pain- dorsum of foot- over within a second or two- but wakes him up- randomly fires- 10-20x/day.   Naltrexone- Not making as much difference as it was.   Called on Friday for refill- today is last pill.         Pain Inventory Average Pain 3 Pain Right Now 3 My pain is intermittent, sharp and dull  In the last 24 hours, has pain interfered with the following? General activity 4 Relation with others 4 Enjoyment of life 4 What TIME of day is your pain at its worst? morning , daytime, evening and night Sleep (in general) Fair  Pain is worse with: unknown Pain improves with: unknown Relief from Meds: a little  History reviewed. No pertinent family history. Social History   Socioeconomic History  . Marital status: Married    Spouse name: Not on file  . Number of children: Not on file  . Years of education: Not on file  . Highest education level: Not on file  Occupational History  . Not on file  Tobacco Use  . Smoking status: Never Smoker  . Smokeless tobacco: Never  Used  Vaping Use  . Vaping Use: Never used  Substance and Sexual Activity  . Alcohol use: Not Currently  . Drug use: Never  . Sexual activity: Not Currently  Other Topics Concern  . Not on file  Social History Narrative   Wife lives pt   Social Determinants of Health   Financial Resource Strain: Not on file  Food Insecurity: Not on file  Transportation Needs: Not on file  Physical Activity: Not on file  Stress: Not on file  Social Connections: Not on file   Past Surgical History:  Procedure Laterality Date  . ABDOMINAL EXPOSURE N/A 09/19/2020   Procedure: ABDOMINAL EXPOSURE;  Surgeon: Rosetta Posner, MD;  Location: [facility] OR;  Service: Vascular;  Laterality: N/A;  anterior  . ANTERIOR LUMBAR FUSION N/A 09/19/2020   Procedure: Anterior Lumbar Interbody Fusion Lumbar Five-Sacral One, posterior lateral fusion with gill decompression Lumbar Five-Sacral One.;  Surgeon: Eustace Moore, MD;  Location: [place];  Service: Neurosurgery;  Laterality: N/A;  anterior  . APPLICATION OF INTRAOPERATIVE CT SCAN N/A 09/27/2020   Procedure: APPLICATION OF INTRAOPERATIVE CT SCAN;  Surgeon: Eustace Moore, MD;  Location: [place];  Service: Neurosurgery;  Laterality: N/A;  . COLONOSCOPY WITH PROPOFOL N/A 12/18/2018   Procedure: COLONOSCOPY WITH BIOPSY;  Surgeon: Lucilla Lame, MD;  Location: [facility];  Service: Endoscopy;  Laterality: N/A;  NEEDS TO STAY FIRST  . ESOPHAGOGASTRODUODENOSCOPY    . ESOPHAGOGASTRODUODENOSCOPY (EGD) WITH PROPOFOL N/A 02/23/2019   Procedure: ESOPHAGOGASTRODUODENOSCOPY (EGD) WITH PROPOFOL;  Surgeon: Lucilla Lame, MD;  Location: [facility] ENDOSCOPY;  Service: Endoscopy;  Laterality: N/A;  . HERNIA REPAIR    . LAMINECTOMY WITH POSTERIOR LATERAL ARTHRODESIS LEVEL 1 N/A 09/19/2020   Procedure: LAMINECTOMY WITH POSTERIOR LATERAL ARTHRODESIS LEVEL 1;  Surgeon: Eustace Moore, MD;  Location: [place];  Service: Neurosurgery;  Laterality: N/A;  posterior  . LAMINECTOMY WITH POSTERIOR LATERAL  ARTHRODESIS LEVEL 2 N/A 09/27/2020   Procedure: Lumbar exploration with Extension of Posterior Instrumentation Lumbar Four to the Ilium with Airo;  Surgeon: Eustace Moore, MD;  Location: [place];  Service: Neurosurgery;  Laterality: N/A;  . LIPOMA EXCISION Right    upper arm  . POLYPECTOMY N/A 12/18/2018   Procedure: POLYPECTOMY;  Surgeon: Lucilla Lame, MD;  Location: [place];  Service: Endoscopy;  Laterality: N/A;  . TENDON REPAIR Left 11/05/2019   Procedure: LEFT THUMB TENDON REPAIR;  Surgeon: Cindra Presume, MD;  Location: [place];  Service: Plastics;  Laterality: Left;  Axillary block, MAC  . UMBILICAL HERNIA REPAIR     Past Surgical History:  Procedure Laterality Date  . ABDOMINAL EXPOSURE N/A 09/19/2020   Procedure: ABDOMINAL EXPOSURE;  Surgeon: Rosetta Posner, MD;  Location: [facility] OR;  Service: Vascular;  Laterality: N/A;  anterior  . ANTERIOR LUMBAR FUSION N/A 09/19/2020   Procedure: Anterior Lumbar Interbody Fusion Lumbar Five-Sacral One, posterior lateral fusion with gill decompression Lumbar Five-Sacral One.;  Surgeon: Eustace Moore, MD;  Location: [place];  Service: Neurosurgery;  Laterality: N/A;  anterior  . APPLICATION OF INTRAOPERATIVE CT SCAN N/A 09/27/2020   Procedure: APPLICATION OF INTRAOPERATIVE CT SCAN;  Surgeon: Eustace Moore, MD;  Location: [place];  Service: Neurosurgery;  Laterality: N/A;  . COLONOSCOPY WITH PROPOFOL N/A 12/18/2018   Procedure: COLONOSCOPY WITH BIOPSY;  Surgeon: Lucilla Lame, MD;  Location: [employer];  Service: Endoscopy;  Laterality: N/A;  NEEDS TO STAY FIRST  . ESOPHAGOGASTRODUODENOSCOPY    . ESOPHAGOGASTRODUODENOSCOPY (EGD) WITH PROPOFOL N/A 02/23/2019   Procedure: ESOPHAGOGASTRODUODENOSCOPY (EGD) WITH PROPOFOL;  Surgeon: Lucilla Lame, MD;  Location: [facility] ENDOSCOPY;  Service: Endoscopy;  Laterality: N/A;  . HERNIA REPAIR    . LAMINECTOMY WITH POSTERIOR LATERAL ARTHRODESIS LEVEL 1 N/A 09/19/2020   Procedure: LAMINECTOMY  WITH POSTERIOR LATERAL ARTHRODESIS LEVEL 1;  Surgeon: Eustace Moore, MD;  Location: [place];  Service: Neurosurgery;  Laterality: N/A;  posterior  . LAMINECTOMY WITH POSTERIOR LATERAL ARTHRODESIS LEVEL 2 N/A 09/27/2020   Procedure: Lumbar exploration with Extension of Posterior Instrumentation Lumbar Four to the Ilium with Airo;  Surgeon: Eustace Moore, MD;  Location: [place];  Service: Neurosurgery;  Laterality: N/A;  . LIPOMA EXCISION Right    upper arm  . POLYPECTOMY N/A 12/18/2018   Procedure: POLYPECTOMY;  Surgeon: Lucilla Lame, MD;  Location: [place];  Service: Endoscopy;  Laterality: N/A;  . TENDON REPAIR Left 11/05/2019   Procedure: LEFT THUMB TENDON REPAIR;  Surgeon: Cindra Presume, MD;  Location: [place];  Service: Plastics;  Laterality: Left;  Axillary block, MAC  . UMBILICAL HERNIA REPAIR     Past Medical History:  Diagnosis Date  . Back pain   . BPH (benign prostatic hyperplasia)   . Extensor tendon laceration, finger, open wound, initial encounter    left  thumb  . Hypertension   . Hypothyroidism   . Macrocytosis   . Numbness and tingling   . Pars defect with spondylolisthesis    BP 110/76   Pulse 67   Temp 98.8 F (37.1 C)   Ht _0  (1.753 m)   Wt 185 lb 12.8 oz (84.3 kg)   SpO2 95%   BMI 27.44 kg/m   Opioid Risk Score:   Fall Risk Score:  `1  Depression screen PHQ 2/9  Depression screen [facility] 2/9 03/12/2021 01/05/2021 12/11/2020 11/10/2020  Decreased Interest 0 - 0 1  Down, Depressed, Hopeless 0 0 0 1  PHQ - 2 Score 0 0 0 2  Altered sleeping - 0 - 0  Tired, decreased energy - - - 1  Change in appetite - - - 0  Feeling bad or failure about yourself  - - - 1  Trouble concentrating - - - 2  Moving slowly or fidgety/restless - - - 2  Suicidal thoughts - - - 0  PHQ-9 Score - 0 - 8   Review of Systems  Musculoskeletal: Positive for back pain.  All other systems reviewed and are negative.      Objective:   Physical Exam  Awake,  alert, appropriate, moving gingerly, NAD Trigger point between 1st/2nd digit of R foot.  Numbness/tingling of 4th/5th digits on L hand Anterior tibialis 5/5 in R foot      Assessment & Plan:    Pt is a 65 yr old s/p lumbar fusion for lumbar radiculopathy, sciatic neuropathy and L ulnar neuropathy here for f/u on chronic back issues and L ulnar neuropathy and chronic nerve pain and back pain. .   1. Will check TSH/free T4/free T3 since TSH has been slowly rising.  And can help sometimes keep pain under better control to treat subclinical hypothyroidism. Gal TSH ~ 1.  2. Will renew Low dose naltrexone- for pt- gave 1 week of 3 mg (6 mg dose) to tide him over for pharmacy issues.   3. Patient here for trigger point injections for myofascial pain.   Consent done and on chart.  Cleaned areas with alcohol and injected using a 27 gauge 1.5 inch needle  Injected 1cc Using 1% Lidocaine with no EPI  Upper traps Levators Posterior scalenes Middle scalenes Splenius Capitus Pectoralis Major Rhomboids Infraspinatus Teres Major/minor Thoracic paraspinals Lumbar paraspinals Other injections- R foot 1st dorsal interossei.    Patient's level of pain prior was 0/10- wasn't haven't pain like normally has Current level of pain after injections is 0/10- numb  There was no bleeding or complications.  Patient was advised to drink a lot of water on day after injections to flush system Will have increased soreness for 12-48 hours after injections.  Can use Lidocaine patches the day AFTER injections Can use theracane on day of injections in places didn't inject Can use heating pad 4-6 hours AFTER injections  4. Surgery on L ulnar neuropathy next week.   5. F/U in 6 weeks

## 2021-03-13 ENCOUNTER — Encounter: Payer: 59 | Admitting: Occupational Therapy

## 2021-03-13 LAB — T3, FREE: T3, Free: 2.4 pg/mL (ref 2.0–4.4)

## 2021-03-13 LAB — T4, FREE: Free T4: 1.48 ng/dL (ref 0.82–1.77)

## 2021-03-13 LAB — TSH: TSH: 1.84 u[IU]/mL (ref 0.450–4.500)

## 2021-03-16 ENCOUNTER — Encounter: Payer: 59 | Admitting: Occupational Therapy

## 2021-03-16 ENCOUNTER — Ambulatory Visit: Payer: 59 | Admitting: Physical Therapy

## 2021-03-18 ENCOUNTER — Ambulatory Visit: Payer: Self-pay

## 2021-03-20 ENCOUNTER — Encounter: Payer: 59 | Admitting: Occupational Therapy

## 2021-03-20 ENCOUNTER — Other Ambulatory Visit: Payer: Self-pay

## 2021-03-20 MED FILL — Pantoprazole Sodium EC Tab 40 MG (Base Equiv): ORAL | 90 days supply | Qty: 90 | Fill #0 | Status: AC

## 2021-03-23 ENCOUNTER — Other Ambulatory Visit: Payer: Self-pay

## 2021-03-23 ENCOUNTER — Encounter: Payer: 59 | Admitting: Occupational Therapy

## 2021-03-23 ENCOUNTER — Other Ambulatory Visit (HOSPITAL_COMMUNITY): Payer: Self-pay

## 2021-03-23 ENCOUNTER — Ambulatory Visit: Payer: 59 | Admitting: Physical Therapy

## 2021-03-23 DIAGNOSIS — G5622 Lesion of ulnar nerve, left upper limb: Secondary | ICD-10-CM | POA: Diagnosis not present

## 2021-03-23 MED ORDER — METHOCARBAMOL 500 MG PO TABS
ORAL_TABLET | ORAL | 0 refills | Status: DC
  Filled 2021-03-23: qty 40, 10d supply, fill #0

## 2021-03-23 MED ORDER — CEPHALEXIN 500 MG PO CAPS
ORAL_CAPSULE | ORAL | 0 refills | Status: DC
  Filled 2021-03-23: qty 28, 7d supply, fill #0

## 2021-03-23 MED ORDER — OXYCODONE HCL 5 MG PO TABS
ORAL_TABLET | ORAL | 0 refills | Status: DC
  Filled 2021-03-23: qty 42, 7d supply, fill #0

## 2021-03-23 MED ORDER — ONDANSETRON 8 MG PO TBDP
ORAL_TABLET | ORAL | 0 refills | Status: DC
  Filled 2021-03-23: qty 15, 5d supply, fill #0

## 2021-03-26 ENCOUNTER — Other Ambulatory Visit (HOSPITAL_COMMUNITY): Payer: Self-pay

## 2021-03-27 ENCOUNTER — Encounter: Payer: 59 | Admitting: Occupational Therapy

## 2021-03-27 ENCOUNTER — Other Ambulatory Visit (HOSPITAL_COMMUNITY): Payer: Self-pay

## 2021-03-28 ENCOUNTER — Other Ambulatory Visit (HOSPITAL_COMMUNITY): Payer: Self-pay

## 2021-03-29 ENCOUNTER — Other Ambulatory Visit (HOSPITAL_COMMUNITY): Payer: Self-pay

## 2021-03-30 ENCOUNTER — Other Ambulatory Visit (HOSPITAL_COMMUNITY): Payer: Self-pay

## 2021-03-30 ENCOUNTER — Ambulatory Visit: Payer: 59 | Admitting: Physical Therapy

## 2021-03-30 ENCOUNTER — Encounter: Payer: 59 | Admitting: Occupational Therapy

## 2021-04-02 ENCOUNTER — Other Ambulatory Visit (INDEPENDENT_AMBULATORY_CARE_PROVIDER_SITE_OTHER): Payer: Self-pay | Admitting: Vascular Surgery

## 2021-04-02 DIAGNOSIS — M79669 Pain in unspecified lower leg: Secondary | ICD-10-CM

## 2021-04-02 NOTE — Progress Notes (Signed)
Patient with increased pain and swelling  of the lower extremity

## 2021-04-03 ENCOUNTER — Telehealth (INDEPENDENT_AMBULATORY_CARE_PROVIDER_SITE_OTHER): Payer: Self-pay

## 2021-04-03 ENCOUNTER — Encounter: Payer: 59 | Admitting: Occupational Therapy

## 2021-04-03 ENCOUNTER — Ambulatory Visit (INDEPENDENT_AMBULATORY_CARE_PROVIDER_SITE_OTHER): Payer: 59

## 2021-04-03 ENCOUNTER — Ambulatory Visit (INDEPENDENT_AMBULATORY_CARE_PROVIDER_SITE_OTHER): Payer: 59 | Admitting: Vascular Surgery

## 2021-04-03 ENCOUNTER — Other Ambulatory Visit: Payer: Self-pay

## 2021-04-03 VITALS — BP 130/87 | HR 62 | Resp 16 | Ht 69.0 in | Wt 180.0 lb

## 2021-04-03 DIAGNOSIS — R600 Localized edema: Secondary | ICD-10-CM

## 2021-04-03 DIAGNOSIS — G5622 Lesion of ulnar nerve, left upper limb: Secondary | ICD-10-CM

## 2021-04-03 DIAGNOSIS — I10 Essential (primary) hypertension: Secondary | ICD-10-CM | POA: Diagnosis not present

## 2021-04-03 DIAGNOSIS — M79662 Pain in left lower leg: Secondary | ICD-10-CM | POA: Diagnosis not present

## 2021-04-03 DIAGNOSIS — M79669 Pain in unspecified lower leg: Secondary | ICD-10-CM

## 2021-04-03 DIAGNOSIS — I824Y2 Acute embolism and thrombosis of unspecified deep veins of left proximal lower extremity: Secondary | ICD-10-CM

## 2021-04-03 MED ORDER — RIVAROXABAN 15 MG PO TABS
15.0000 mg | ORAL_TABLET | Freq: Two times a day (BID) | ORAL | 0 refills | Status: DC
  Filled 2021-04-03: qty 36, 18d supply, fill #0
  Filled 2021-04-03: qty 6, 3d supply, fill #0

## 2021-04-03 NOTE — Telephone Encounter (Signed)
Spoke with the patient and he is scheduled with Dr. Lucky Cowboy for a left leg thrombectomy on 04/05/21 with a  10:00 am arrival time to the [facility]. Pre-procedure instruction were discussed.

## 2021-04-03 NOTE — Assessment & Plan Note (Signed)
S/p recent surgery and this caused some immobility which may have led to the DVT

## 2021-04-03 NOTE — Assessment & Plan Note (Addendum)
His duplex findings were consistent with acute DVT of the left popliteal and superficial femoral vein up to the common femoral vein.  With his significant symptoms and clearly acute DVT with no symptoms, consideration for thrombectomy is certainly reasonable for him. We discussed the risks and benefits and he would like to proceed.  We will get this scheduled for later this week.  He will continue his Xarelto and does not need to stop this.  New prescription for Xarelto was given to increase the dose to 15 mg twice daily.

## 2021-04-03 NOTE — H&P (View-Only) (Signed)
Patient ID: Alexander Rojas, male   DOB: [birth year], 65 y.o.   MRN: [id number]  Chief Complaint  Patient presents with   Follow-up    Add on for positive dvt     HPI Alexander Rojas is a 65 y.o. male.  Patient presents for pain and swelling of the left lower extremity.  About 10 days ago, he had an ulnar nerve repair and has been somewhat immobile since that time.  About 6 months ago, he had a lumbar fusion which has been a complicated and prolonged process with multiple issues.  He had a DVT when he went to rehab after the lumbar fusion, and was treated with 3 months of anticoagulation.  He has done well in terms of pain or swelling in his legs until recently.  He does get some swelling in both lower extremities with his immobility and back issues, but have been stable.  Yesterday, he began noticing severe pain in his left calf and that his left calf was quite tight.  With his immobility and previous history of DVT and his medical knowledge, he astutely contacted my partner and came in today for an ultrasound.  His duplex findings were consistent with acute DVT of the left popliteal and superficial femoral vein up to the common femoral vein.   Past Medical History:  Diagnosis Date   Back pain    BPH (benign prostatic hyperplasia)    Extensor tendon laceration, finger, open wound, initial encounter    left thumb   Hypertension    Hypothyroidism    Macrocytosis    Numbness and tingling    Pars defect with spondylolisthesis     Past Surgical History:  Procedure Laterality Date   ABDOMINAL EXPOSURE N/A 09/19/2020   Procedure: ABDOMINAL EXPOSURE;  Surgeon: Rosetta Posner, MD;  Location: [facility] OR;  Service: Vascular;  Laterality: N/A;  anterior   ANTERIOR LUMBAR FUSION N/A 09/19/2020   Procedure: Anterior Lumbar Interbody Fusion Lumbar Five-Sacral One, posterior lateral fusion with gill decompression Lumbar Five-Sacral One.;  Surgeon: Eustace Moore, MD;  Location: [place];  Service:  Neurosurgery;  Laterality: N/A;  anterior   APPLICATION OF INTRAOPERATIVE CT SCAN N/A 09/27/2020   Procedure: APPLICATION OF INTRAOPERATIVE CT SCAN;  Surgeon: Eustace Moore, MD;  Location: [place];  Service: Neurosurgery;  Laterality: N/A;   COLONOSCOPY WITH PROPOFOL N/A 12/18/2018   Procedure: COLONOSCOPY WITH BIOPSY;  Surgeon: Lucilla Lame, MD;  Location: [place];  Service: Endoscopy;  Laterality: N/A;  NEEDS TO STAY FIRST   ESOPHAGOGASTRODUODENOSCOPY     ESOPHAGOGASTRODUODENOSCOPY (EGD) WITH PROPOFOL N/A 02/23/2019   Procedure: ESOPHAGOGASTRODUODENOSCOPY (EGD) WITH PROPOFOL;  Surgeon: Lucilla Lame, MD;  Location: [facility] ENDOSCOPY;  Service: Endoscopy;  Laterality: N/A;   HERNIA REPAIR     LAMINECTOMY WITH POSTERIOR LATERAL ARTHRODESIS LEVEL 1 N/A 09/19/2020   Procedure: LAMINECTOMY WITH POSTERIOR LATERAL ARTHRODESIS LEVEL 1;  Surgeon: Eustace Moore, MD;  Location: [place];  Service: Neurosurgery;  Laterality: N/A;  posterior   LAMINECTOMY WITH POSTERIOR LATERAL ARTHRODESIS LEVEL 2 N/A 09/27/2020   Procedure: Lumbar exploration with Extension of Posterior Instrumentation Lumbar Four to the Ilium with Airo;  Surgeon: Eustace Moore, MD;  Location: [place];  Service: Neurosurgery;  Laterality: N/A;   LIPOMA EXCISION Right    upper arm   POLYPECTOMY N/A 12/18/2018   Procedure: POLYPECTOMY;  Surgeon: Lucilla Lame, MD;  Location: [place];  Service: Endoscopy;  Laterality: N/A;   TENDON REPAIR  Left 11/05/2019   Procedure: LEFT THUMB TENDON REPAIR;  Surgeon: Cindra Presume, MD;  Location: [place];  Service: Plastics;  Laterality: Left;  Axillary block, MAC   UMBILICAL HERNIA REPAIR       Family History No reported history of bleeding disorders, clotting disorders, or aneurysms   Social History   Tobacco Use   Smoking status: Never   Smokeless tobacco: Never  Vaping Use   Vaping Use: Never used  Substance Use Topics   Alcohol use: Not Currently   Drug use:  Never     Allergies  Allergen Reactions   Alendronate Sodium     Current Outpatient Medications  Medication Sig Dispense Refill   Abaloparatide (TYMLOS) 3120 MCG/1.56ML SOPN Inject 80 mcg subcutaneously once daily 1.56 mL 11   calcium-vitamin D (OSCAL WITH D) 500-200 MG-UNIT tablet Take 1 tablet by mouth daily. 30 tablet 0   hydrochlorothiazide (HYDRODIURIL) 12.5 MG tablet      levothyroxine (SYNTHROID) 100 MCG tablet Take 100 mcg by mouth daily before breakfast.      losartan (COZAAR) 50 MG tablet Take 50 mg by mouth daily.     naltrexone (DEPADE) 50 MG tablet      pantoprazole (PROTONIX) 40 MG tablet TAKE 1 TABLET BY MOUTH ONCE DAILY 90 tablet 1   QC Alcohol Swabs 70 % PADS Apply 1 each topically once daily 100 each 2   Rivaroxaban (XARELTO) 15 MG TABS tablet Take 1 tablet (15 mg total) by mouth 2 (two) times daily with a meal. 42 tablet 0   Insulin Pen Needle (BD PEN NEEDLE NANO 2ND GEN) 32G X 4 MM MISC use as directed (Patient not taking: No sig reported) 100 each 2   No current facility-administered medications for this visit.      REVIEW OF SYSTEMS (Negative unless checked)  Constitutional: _0 Weight loss  _1 Fever  _2 Chills Cardiac: _3 Chest pain   _4 Chest pressure   _5 Palpitations   _6 Shortness of breath when laying flat   _7 Shortness of breath at rest   _8 Shortness of breath with exertion. Vascular:  _9 Pain in legs with walking   _10 Pain in legs at rest   _11 Pain in legs when laying flat   _12 Claudication   _13 Pain in feet when walking  _14 Pain in feet at rest  _15 Pain in feet when laying flat   _16 History of DVT   _17 Phlebitis   _18 Swelling in legs   _19 Varicose veins   _20 Non-healing ulcers Pulmonary:   _21 Uses home oxygen   _22 Productive cough   _23 Hemoptysis   _24 Wheeze  _25 COPD   _26 Asthma Neurologic:  _27 Dizziness  _28 Blackouts   _29 Seizures   _30 History of stroke   _31 History of TIA  _32 Aphasia   _33 Temporary blindness   _34 Dysphagia   _35 Weakness or numbness in arms   _36 Weakness or numbness  in legs Musculoskeletal:  _37 Arthritis   _38 Joint swelling   _39 Joint pain   _40 Low back pain Hematologic:  _41 Easy bruising  _42 Easy bleeding   _43 Hypercoagulable state   _44 Anemic  _45 Hepatitis Gastrointestinal:  _46 Blood in stool   _47 Vomiting blood  _48 Gastroesophageal reflux/heartburn   _49 Abdominal pain Genitourinary:  _50 Chronic kidney disease   _51 Difficult urination  _52 Frequent urination  _53 Burning with urination   _54 Hematuria Skin:  _55 Rashes   _56 Ulcers   _57 Wounds Psychological:  _58 History of anxiety   _59  History of major depression.    Physical Exam BP 130/87 (BP Location: Right Arm)   Pulse 62   Resp 16   Ht _60  (1.753 m)  Wt 180 lb (81.6 kg)   BMI 26.58 kg/m  Gen:  WD/WN, NAD Head: [place]/AT, No temporalis wasting.  Ear/Nose/Throat: Hearing grossly intact, nares w/o erythema or drainage, oropharynx w/o Erythema/Exudate Eyes: Conjunctiva clear, sclera non-icteric  Neck: trachea midline.  No JVD.  Pulmonary:  Good air movement, respirations not labored, no use of accessory muscles  Cardiac: RRR, no JVD Vascular:  Vessel Right Left  Radial Palpable Palpable                          DP 2+ 2+  PT 2+ 1   Musculoskeletal: M/S 5/5 throughout.  Left arm is in a postoperative cast after his ulnar nerve repair.  1+ right lower extremity edema, 2+ left lower extremity edema Neurologic: Sensation grossly intact in extremities.  Symmetrical.  Speech is fluent. Motor exam as listed above. Psychiatric: Judgment intact, Mood & affect appropriate for pt's clinical situation. Dermatologic: No rashes or ulcers noted.  No cellulitis or open wounds.    Radiology No results found.  Labs Recent Results (from the past 2160 hour(s))  T4, free     Status: None   Collection Time: 03/12/21  4:07 PM  Result Value Ref Range   Free T4 1.48 0.82 - 1.77 ng/dL  T3, free     Status: None   Collection Time: 03/12/21  4:07 PM  Result Value Ref Range   T3, Free 2.4 2.0 - 4.4 pg/mL  TSH     Status:  None   Collection Time: 03/12/21  4:07 PM  Result Value Ref Range   TSH 1.840 0.450 - 4.500 uIU/mL    Assessment/Plan:  Essential hypertension blood pressure control important in reducing the progression of atherosclerotic disease. On appropriate oral medications.   Ulnar nerve entrapment syndrome, left S/p recent surgery and this caused some immobility which may have led to the DVT  DVT (deep venous thrombosis) ([place]) His duplex findings were consistent with acute DVT of the left popliteal and superficial femoral vein up to the common femoral vein.  With his significant symptoms and clearly acute DVT with no symptoms, consideration for thrombectomy is certainly reasonable for him. We discussed the risks and benefits and he would like to proceed.  We will get this scheduled for later this week.  He will continue his Xarelto and does not need to stop this.  New prescription for Xarelto was given to increase the dose to 15 mg twice daily.      Leotis Pain 04/03/2021, 1:34 PM   This note was created with Dragon medical transcription system.  Any errors from dictation are unintentional.

## 2021-04-03 NOTE — Progress Notes (Signed)
  Patient ID: Alexander Rojas, male   DOB: 12/02/1955, 65 y.o.   MRN: [id number]  Chief Complaint  Patient presents with   Follow-up    Add on for positive dvt     HPI Alexander Rojas is a 65 y.o. male.  Patient presents for pain and swelling of the left lower extremity.  About 10 days ago, he had an ulnar nerve repair and has been somewhat immobile since that time.  About 6 months ago, he had a lumbar fusion which has been a complicated and prolonged process with multiple issues.  He had a DVT when he went to rehab after the lumbar fusion, and was treated with 3 months of anticoagulation.  He has done well in terms of pain or swelling in his legs until recently.  He does get some swelling in both lower extremities with his immobility and back issues, but have been stable.  Yesterday, he began noticing severe pain in his left calf and that his left calf was quite tight.  With his immobility and previous history of DVT and his medical knowledge, he astutely contacted my partner and came in today for an ultrasound.  His duplex findings were consistent with acute DVT of the left popliteal and superficial femoral vein up to the common femoral vein.   Past Medical History:  Diagnosis Date   Back pain    BPH (benign prostatic hyperplasia)    Extensor tendon laceration, finger, open wound, initial encounter    left thumb   Hypertension    Hypothyroidism    Macrocytosis    Numbness and tingling    Pars defect with spondylolisthesis     Past Surgical History:  Procedure Laterality Date   ABDOMINAL EXPOSURE N/A 09/19/2020   Procedure: ABDOMINAL EXPOSURE;  Surgeon: Early, Todd F, MD;  Location: [facility];  Service: Vascular;  Laterality: N/A;  anterior   ANTERIOR LUMBAR FUSION N/A 09/19/2020   Procedure: Anterior Lumbar Interbody Fusion Lumbar Five-Sacral One, posterior lateral fusion with gill decompression Lumbar Five-Sacral One.;  Surgeon: Jones, David S, MD;  Location: [facility];  Service:  Neurosurgery;  Laterality: N/A;  anterior   APPLICATION OF INTRAOPERATIVE CT SCAN N/A 09/27/2020   Procedure: APPLICATION OF INTRAOPERATIVE CT SCAN;  Surgeon: Jones, David S, MD;  Location: [facility];  Service: Neurosurgery;  Laterality: N/A;   COLONOSCOPY WITH PROPOFOL N/A 12/18/2018   Procedure: COLONOSCOPY WITH BIOPSY;  Surgeon: Wohl, Darren, MD;  Location: [facility];  Service: Endoscopy;  Laterality: N/A;  NEEDS TO STAY FIRST   ESOPHAGOGASTRODUODENOSCOPY     ESOPHAGOGASTRODUODENOSCOPY (EGD) WITH PROPOFOL N/A 02/23/2019   Procedure: ESOPHAGOGASTRODUODENOSCOPY (EGD) WITH PROPOFOL;  Surgeon: Wohl, Darren, MD;  Location: [facility];  Service: Endoscopy;  Laterality: N/A;   HERNIA REPAIR     LAMINECTOMY WITH POSTERIOR LATERAL ARTHRODESIS LEVEL 1 N/A 09/19/2020   Procedure: LAMINECTOMY WITH POSTERIOR LATERAL ARTHRODESIS LEVEL 1;  Surgeon: Jones, David S, MD;  Location: [facility];  Service: Neurosurgery;  Laterality: N/A;  posterior   LAMINECTOMY WITH POSTERIOR LATERAL ARTHRODESIS LEVEL 2 N/A 09/27/2020   Procedure: Lumbar exploration with Extension of Posterior Instrumentation Lumbar Four to the [place] with Airo;  Surgeon: Jones, David S, MD;  Location: [facility];  Service: Neurosurgery;  Laterality: N/A;   LIPOMA EXCISION Right    upper arm   POLYPECTOMY N/A 12/18/2018   Procedure: POLYPECTOMY;  Surgeon: Wohl, Darren, MD;  Location: [facility];  Service: Endoscopy;  Laterality: N/A;   TENDON REPAIR   Left 11/05/2019   Procedure: LEFT THUMB TENDON REPAIR;  Surgeon: Pace, Collier S, MD;  Location: [facility];  Service: Plastics;  Laterality: Left;  Axillary block, MAC   UMBILICAL HERNIA REPAIR       Family History No reported history of bleeding disorders, clotting disorders, or aneurysms   Social History   Tobacco Use   Smoking status: Never   Smokeless tobacco: Never  Vaping Use   Vaping Use: Never used  Substance Use Topics   Alcohol use: Not Currently   Drug use:  Never     Allergies  Allergen Reactions   Alendronate Sodium     Current Outpatient Medications  Medication Sig Dispense Refill   Abaloparatide (TYMLOS) 3120 MCG/1.56ML SOPN Inject 80 mcg subcutaneously once daily 1.56 mL 11   calcium-vitamin D (OSCAL WITH D) 500-200 MG-UNIT tablet Take 1 tablet by mouth daily. 30 tablet 0   hydrochlorothiazide (HYDRODIURIL) 12.5 MG tablet      levothyroxine (SYNTHROID) 100 MCG tablet Take 100 mcg by mouth daily before breakfast.      losartan (COZAAR) 50 MG tablet Take 50 mg by mouth daily.     naltrexone (DEPADE) 50 MG tablet      pantoprazole (PROTONIX) 40 MG tablet TAKE 1 TABLET BY MOUTH ONCE DAILY 90 tablet 1   QC Alcohol Swabs 70 % PADS Apply 1 each topically once daily 100 each 2   Rivaroxaban (XARELTO) 15 MG TABS tablet Take 1 tablet (15 mg total) by mouth 2 (two) times daily with a meal. 42 tablet 0   Insulin Pen Needle (BD PEN NEEDLE NANO 2ND GEN) 32G X 4 MM MISC use as directed (Patient not taking: No sig reported) 100 each 2   No current facility-administered medications for this visit.      REVIEW OF SYSTEMS (Negative unless checked)  Constitutional: []Weight loss  []Fever  []Chills Cardiac: []Chest pain   []Chest pressure   []Palpitations   []Shortness of breath when laying flat   []Shortness of breath at rest   []Shortness of breath with exertion. Vascular:  []Pain in legs with walking   []Pain in legs at rest   []Pain in legs when laying flat   []Claudication   []Pain in feet when walking  []Pain in feet at rest  []Pain in feet when laying flat   [x]History of DVT   [x]Phlebitis   [x]Swelling in legs   []Varicose veins   []Non-healing ulcers Pulmonary:   []Uses home oxygen   []Productive cough   []Hemoptysis   []Wheeze  []COPD   []Asthma Neurologic:  []Dizziness  []Blackouts   []Seizures   []History of stroke   []History of TIA  []Aphasia   []Temporary blindness   []Dysphagia   [x]Weakness or numbness in arms   []Weakness or numbness  in legs Musculoskeletal:  []Arthritis   []Joint swelling   []Joint pain   [x]Low back pain Hematologic:  []Easy bruising  []Easy bleeding   []Hypercoagulable state   []Anemic  []Hepatitis Gastrointestinal:  []Blood in stool   []Vomiting blood  []Gastroesophageal reflux/heartburn   []Abdominal pain Genitourinary:  []Chronic kidney disease   []Difficult urination  []Frequent urination  []Burning with urination   []Hematuria Skin:  []Rashes   []Ulcers   []Wounds Psychological:  []History of anxiety   [] History of major depression.    Physical Exam BP 130/87 (BP Location: Right Arm)   Pulse 62   Resp 16   Ht 5' 9" (1.753 m)     Wt 180 lb (81.6 kg)   BMI 26.58 kg/m  Gen:  WD/WN, NAD Head: [place]/AT, No temporalis wasting.  Ear/Nose/Throat: Hearing grossly intact, nares w/o erythema or drainage, oropharynx w/o Erythema/Exudate Eyes: Conjunctiva clear, sclera non-icteric  Neck: trachea midline.  No JVD.  Pulmonary:  Good air movement, respirations not labored, no use of accessory muscles  Cardiac: RRR, no JVD Vascular:  Vessel Right Left  Radial Palpable Palpable                          DP 2+ 2+  PT 2+ 1   Musculoskeletal: M/S 5/5 throughout.  Left arm is in a postoperative cast after his ulnar nerve repair.  1+ right lower extremity edema, 2+ left lower extremity edema Neurologic: Sensation grossly intact in extremities.  Symmetrical.  Speech is fluent. Motor exam as listed above. Psychiatric: Judgment intact, Mood & affect appropriate for pt's clinical situation. Dermatologic: No rashes or ulcers noted.  No cellulitis or open wounds.    Radiology No results found.  Labs Recent Results (from the past 2160 hour(s))  T4, free     Status: None   Collection Time: 03/12/21  4:07 PM  Result Value Ref Range   Free T4 1.48 0.82 - 1.77 ng/dL  T3, free     Status: None   Collection Time: 03/12/21  4:07 PM  Result Value Ref Range   T3, Free 2.4 2.0 - 4.4 pg/mL  TSH     Status:  None   Collection Time: 03/12/21  4:07 PM  Result Value Ref Range   TSH 1.840 0.450 - 4.500 uIU/mL    Assessment/Plan:  Essential hypertension blood pressure control important in reducing the progression of atherosclerotic disease. On appropriate oral medications.   Ulnar nerve entrapment syndrome, left S/p recent surgery and this caused some immobility which may have led to the DVT  DVT (deep venous thrombosis) (HCC) His duplex findings were consistent with acute DVT of the left popliteal and superficial femoral vein up to the common femoral vein.  With his significant symptoms and clearly acute DVT with no symptoms, consideration for thrombectomy is certainly reasonable for him. We discussed the risks and benefits and he would like to proceed.  We will get this scheduled for later this week.  He will continue his Xarelto and does not need to stop this.  New prescription for Xarelto was given to increase the dose to 15 mg twice daily.      Edrei Norgaard 04/03/2021, 1:34 PM   This note was created with Dragon medical transcription system.  Any errors from dictation are unintentional.    

## 2021-04-03 NOTE — Assessment & Plan Note (Signed)
blood pressure control important in reducing the progression of atherosclerotic disease. On appropriate oral medications.  

## 2021-04-04 ENCOUNTER — Other Ambulatory Visit: Payer: Self-pay

## 2021-04-04 DIAGNOSIS — M25622 Stiffness of left elbow, not elsewhere classified: Secondary | ICD-10-CM | POA: Diagnosis not present

## 2021-04-04 DIAGNOSIS — M25632 Stiffness of left wrist, not elsewhere classified: Secondary | ICD-10-CM | POA: Diagnosis not present

## 2021-04-04 DIAGNOSIS — Z4789 Encounter for other orthopedic aftercare: Secondary | ICD-10-CM | POA: Insufficient documentation

## 2021-04-05 ENCOUNTER — Other Ambulatory Visit: Payer: Self-pay

## 2021-04-05 ENCOUNTER — Other Ambulatory Visit (INDEPENDENT_AMBULATORY_CARE_PROVIDER_SITE_OTHER): Payer: Self-pay | Admitting: Nurse Practitioner

## 2021-04-05 ENCOUNTER — Encounter
Admission: RE | Disposition: A | Discharge status: Home / Self Care | Payer: Self-pay | Source: Ambulatory Visit | Attending: Vascular Surgery

## 2021-04-05 ENCOUNTER — Encounter: Payer: Self-pay | Admitting: Vascular Surgery

## 2021-04-05 ENCOUNTER — Ambulatory Visit
Admission: RE | Admit: 2021-04-05 | Discharge: 2021-04-05 | Disposition: A | Discharge status: Home / Self Care | Payer: 59 | Source: Ambulatory Visit | Attending: Vascular Surgery | Admitting: Vascular Surgery

## 2021-04-05 DIAGNOSIS — Z79899 Other long term (current) drug therapy: Secondary | ICD-10-CM | POA: Diagnosis not present

## 2021-04-05 DIAGNOSIS — I82432 Acute embolism and thrombosis of left popliteal vein: Secondary | ICD-10-CM | POA: Diagnosis not present

## 2021-04-05 DIAGNOSIS — I10 Essential (primary) hypertension: Secondary | ICD-10-CM | POA: Diagnosis not present

## 2021-04-05 DIAGNOSIS — G5622 Lesion of ulnar nerve, left upper limb: Secondary | ICD-10-CM | POA: Diagnosis not present

## 2021-04-05 DIAGNOSIS — Z7901 Long term (current) use of anticoagulants: Secondary | ICD-10-CM | POA: Diagnosis not present

## 2021-04-05 DIAGNOSIS — Z79890 Hormone replacement therapy (postmenopausal): Secondary | ICD-10-CM | POA: Diagnosis not present

## 2021-04-05 DIAGNOSIS — I82402 Acute embolism and thrombosis of unspecified deep veins of left lower extremity: Secondary | ICD-10-CM

## 2021-04-05 DIAGNOSIS — I824Y2 Acute embolism and thrombosis of unspecified deep veins of left proximal lower extremity: Secondary | ICD-10-CM | POA: Diagnosis not present

## 2021-04-05 HISTORY — PX: PERIPHERAL VASCULAR THROMBECTOMY: CATH118306

## 2021-04-05 SURGERY — PERIPHERAL VASCULAR THROMBECTOMY
Anesthesia: Moderate Sedation | Laterality: Left

## 2021-04-05 MED ORDER — IODIXANOL 320 MG/ML IV SOLN
INTRAVENOUS | Status: DC | PRN
  Administered 2021-04-05: 25 mL via INTRAVENOUS

## 2021-04-05 MED ORDER — SODIUM CHLORIDE 0.9 % IV SOLN: INTRAVENOUS | Status: DC

## 2021-04-05 MED ORDER — FENTANYL CITRATE (PF) 100 MCG/2ML IJ SOLN
INTRAMUSCULAR | Status: DC | PRN
  Administered 2021-04-05 (×2): 25 ug via INTRAVENOUS

## 2021-04-05 MED ORDER — MIDAZOLAM HCL 2 MG/2ML IJ SOLN
INTRAMUSCULAR | Status: AC
  Filled 2021-04-05: qty 2

## 2021-04-05 MED ORDER — HEPARIN SODIUM (PORCINE) 1000 UNIT/ML IJ SOLN
INTRAMUSCULAR | Status: DC | PRN
  Administered 2021-04-05: 3000 [IU] via INTRAVENOUS

## 2021-04-05 MED ORDER — ONDANSETRON HCL 4 MG/2ML IJ SOLN: 4.0000 mg | Freq: Four times a day (QID) | INTRAMUSCULAR | Status: DC | PRN

## 2021-04-05 MED ORDER — MIDAZOLAM HCL 2 MG/2ML IJ SOLN
INTRAMUSCULAR | Status: DC | PRN
  Administered 2021-04-05 (×2): 1 mg via INTRAVENOUS

## 2021-04-05 MED ORDER — METHYLPREDNISOLONE SODIUM SUCC 125 MG IJ SOLR: 125.0000 mg | Freq: Once | INTRAMUSCULAR | Status: DC | PRN

## 2021-04-05 MED ORDER — HYDROMORPHONE HCL 1 MG/ML IJ SOLN: 1.0000 mg | Freq: Once | INTRAMUSCULAR | Status: DC | PRN

## 2021-04-05 MED ORDER — MIDAZOLAM HCL 2 MG/ML PO SYRP: 8.0000 mg | ORAL_SOLUTION | Freq: Once | ORAL | Status: DC | PRN

## 2021-04-05 MED ORDER — FENTANYL CITRATE (PF) 100 MCG/2ML IJ SOLN
INTRAMUSCULAR | Status: AC
  Filled 2021-04-05: qty 2

## 2021-04-05 MED ORDER — CEFAZOLIN SODIUM-DEXTROSE 2-4 GM/100ML-% IV SOLN: 2.0000 g | Freq: Once | INTRAVENOUS | Status: AC

## 2021-04-05 MED ORDER — FAMOTIDINE 20 MG PO TABS: 40.0000 mg | ORAL_TABLET | Freq: Once | ORAL | Status: DC | PRN

## 2021-04-05 MED ORDER — HEPARIN SODIUM (PORCINE) 1000 UNIT/ML IJ SOLN
INTRAMUSCULAR | Status: AC
  Filled 2021-04-05: qty 1

## 2021-04-05 MED ORDER — CEFAZOLIN SODIUM-DEXTROSE 2-4 GM/100ML-% IV SOLN
INTRAVENOUS | Status: AC
  Administered 2021-04-05: 2 g via INTRAVENOUS
  Filled 2021-04-05: qty 100

## 2021-04-05 MED ORDER — DIPHENHYDRAMINE HCL 50 MG/ML IJ SOLN: 50.0000 mg | Freq: Once | INTRAMUSCULAR | Status: DC | PRN

## 2021-04-05 SURGICAL SUPPLY — 10 items
CANISTER PENUMBRA ENGINE (MISCELLANEOUS) ×3 IMPLANT
CANNULA 5F STIFF (CANNULA) ×3 IMPLANT
CATH INDIGO 12XTORQ 100 (CATHETERS) ×3 IMPLANT
CATH KUMPE SOFT-VU 5FR 65 (CATHETERS) ×3 IMPLANT
COVER EZ STRL 42X30 (DRAPES) ×3 IMPLANT
COVER PROBE U/S 5X48 (MISCELLANEOUS) ×3 IMPLANT
PACK ANGIOGRAPHY (CUSTOM PROCEDURE TRAY) ×3 IMPLANT
SHEATH BRITE TIP 6FRX11 (SHEATH) ×3 IMPLANT
SHEATH PINNACLE 11FRX10 (SHEATH) ×3 IMPLANT
WIRE MAGIC TOR.035 180C (WIRE) ×3 IMPLANT

## 2021-04-05 NOTE — Op Note (Signed)
[facility]   OPERATIVE NOTE   PRE-OPERATIVE DIAGNOSIS: extensive LLE DVT  POST-OPERATIVE DIAGNOSIS: same   PROCEDURE: 1.   US guidance for vascular access to left popliteal vein 2.   Catheter placement into left external iliac vein from left popliteal approach 3.   IVC gram and left lower extremity venogram 4.   Mechanical thrombectomy to left superficial femoral vein and common femoral vein with penumbra CAT 12 catheter     SURGEON: Leotis Pain, MD  ASSISTANT(S): none  ANESTHESIA: local with moderate conscious sedation for 26 minutes using 2 mg of Versed and 50 mcg of Fentanyl  ESTIMATED BLOOD LOSS: 150 cc  FINDING(S): 1.  The popliteal vein was actually patent with thrombus beginning in the most distal superficial femoral vein and extending up to the common femoral vein.  With a catheter in the left external iliac vein, the iliac veins were imaged as was the IVC.  These were widely patent without significant stenosis or any signs of May Thurner syndrome.  SPECIMEN(S):  none  INDICATIONS:    Patient is a 65 y.o. male who presents with left lower extremity DVT.  Patient has marked leg swelling and pain.  Venous intervention is performed to reduce the symtpoms and avoid long term postphlebitic symptoms.    DESCRIPTION: After obtaining full informed written consent, the patient was brought back to the vascular suite and placed supine upon the table. Moderate conscious sedation was administered during a face to face encounter with the patient throughout the procedure with my supervision of the RN administering medicines and monitoring the patient's vital signs, pulse oximetry, telemetry and mental status throughout from the start of the procedure until the patient was taken to the recovery room.  After obtaining adequate anesthesia, the patient was prepped and draped in the standard fashion.    The patient was then placed into the prone position.  The left  popliteal vein was then accessed under direct ultrasound guidance without difficulty with a micropuncture needle and a permanent image was recorded.  An image was performed through the micropuncture sheath which showed the popliteal vein to actually be patent but the superficial femoral vein at the most distal aspect had an abrupt occlusion consistent with thrombosis.  I then upsized to an 11Fr sheath over a J wire.  3000 units of heparin were then given.  Imaging was then performed through the 11 French sheath.  The thrombus extended up to the common femoral vein.  The penumbra CAT 12 catheter was then used to perform mechanical thrombectomy with 2 passes throughout the superficial femoral vein and common femoral vein with large amounts of thrombus removed.  The CAT 12 catheter was then taken up to the left external iliac vein where imaging was performed showing the iliac veins to be widely patent without significant stenosis or thrombosis and no signs of May Thurner syndrome.  The IVC was widely patent.  The catheter was pulled back and imaging was performed as a completion image after thrombectomy with roughly 90% of the thrombus removed with only a small amount of residual thrombus in the mid to proximal superficial femoral vein on some vein valves.  The flow was markedly improved.  I then elected to terminate the procedure.  The sheath was removed and a dressing was placed.  She was taken to the recovery room in stable condition having tolerated the procedure well.    COMPLICATIONS: None  CONDITION: Stable  Leotis Pain 04/05/2021 11:24 AM

## 2021-04-05 NOTE — Interval H&P Note (Signed)
History and Physical Interval Note:  04/05/2021 9:34 AM  Alexander Rojas  has presented today for surgery, with the diagnosis of LLE Thrombectomy   DVT.  The various methods of treatment have been discussed with the patient and family. After consideration of risks, benefits and other options for treatment, the patient has consented to  Procedure(s): PERIPHERAL VASCULAR THROMBECTOMY (Left) as a surgical intervention.  The patient's history has been reviewed, patient examined, no change in status, stable for surgery.  I have reviewed the patient's chart and labs.  Questions were answered to the patient's satisfaction.     Leotis Pain

## 2021-04-05 NOTE — Progress Notes (Signed)
Called [facility] at [phone] to coordinate follow up appointment for two week f/u with Dr. Lucky Cowboy.  Call went directly to voice message and left lengthy message that Dr. Lucky Cowboy requests two week f/u with Dr. Genevive Bi in the office and left Dr. Genevive Bi telephone number and asked them to contact patient with date/time of f/u appointment.

## 2021-04-06 ENCOUNTER — Ambulatory Visit: Payer: 59 | Admitting: Physical Therapy

## 2021-04-06 ENCOUNTER — Encounter: Payer: 59 | Admitting: Occupational Therapy

## 2021-04-09 DIAGNOSIS — M25532 Pain in left wrist: Secondary | ICD-10-CM | POA: Diagnosis not present

## 2021-04-09 DIAGNOSIS — M25622 Stiffness of left elbow, not elsewhere classified: Secondary | ICD-10-CM | POA: Diagnosis not present

## 2021-04-10 ENCOUNTER — Other Ambulatory Visit: Payer: Self-pay

## 2021-04-10 ENCOUNTER — Ambulatory Visit (INDEPENDENT_AMBULATORY_CARE_PROVIDER_SITE_OTHER): Payer: 59 | Admitting: Vascular Surgery

## 2021-04-10 DIAGNOSIS — M4310 Spondylolisthesis, site unspecified: Secondary | ICD-10-CM | POA: Diagnosis not present

## 2021-04-10 DIAGNOSIS — M545 Low back pain, unspecified: Secondary | ICD-10-CM | POA: Diagnosis not present

## 2021-04-10 DIAGNOSIS — G8929 Other chronic pain: Secondary | ICD-10-CM | POA: Diagnosis not present

## 2021-04-11 ENCOUNTER — Other Ambulatory Visit: Payer: Self-pay

## 2021-04-11 MED ORDER — LEVOTHYROXINE SODIUM 100 MCG PO TABS
ORAL_TABLET | ORAL | 1 refills | Status: DC
  Filled 2021-04-11: qty 90, 90d supply, fill #0
  Filled 2021-06-14 – 2021-06-21 (×2): qty 90, 90d supply, fill #1

## 2021-04-11 MED ORDER — HYDROCHLOROTHIAZIDE 12.5 MG PO CAPS
ORAL_CAPSULE | ORAL | 1 refills | Status: AC
  Filled 2021-04-11: qty 90, 90d supply, fill #0
  Filled 2021-06-14 – 2021-06-21 (×3): qty 90, 90d supply, fill #1

## 2021-04-17 DIAGNOSIS — R531 Weakness: Secondary | ICD-10-CM | POA: Diagnosis not present

## 2021-04-17 DIAGNOSIS — R2689 Other abnormalities of gait and mobility: Secondary | ICD-10-CM | POA: Diagnosis not present

## 2021-04-17 DIAGNOSIS — M2560 Stiffness of unspecified joint, not elsewhere classified: Secondary | ICD-10-CM | POA: Diagnosis not present

## 2021-04-17 DIAGNOSIS — M545 Low back pain, unspecified: Secondary | ICD-10-CM | POA: Diagnosis not present

## 2021-04-18 ENCOUNTER — Other Ambulatory Visit: Payer: Self-pay

## 2021-04-19 ENCOUNTER — Other Ambulatory Visit (HOSPITAL_COMMUNITY): Payer: Self-pay

## 2021-04-20 ENCOUNTER — Other Ambulatory Visit: Payer: Self-pay

## 2021-04-20 ENCOUNTER — Ambulatory Visit (INDEPENDENT_AMBULATORY_CARE_PROVIDER_SITE_OTHER): Payer: 59 | Admitting: Vascular Surgery

## 2021-04-20 VITALS — BP 122/79 | HR 67 | Ht 69.0 in | Wt 186.0 lb

## 2021-04-20 DIAGNOSIS — M545 Low back pain, unspecified: Secondary | ICD-10-CM | POA: Diagnosis not present

## 2021-04-20 DIAGNOSIS — I10 Essential (primary) hypertension: Secondary | ICD-10-CM | POA: Diagnosis not present

## 2021-04-20 DIAGNOSIS — G5622 Lesion of ulnar nerve, left upper limb: Secondary | ICD-10-CM | POA: Diagnosis not present

## 2021-04-20 DIAGNOSIS — I824Y2 Acute embolism and thrombosis of unspecified deep veins of left proximal lower extremity: Secondary | ICD-10-CM

## 2021-04-20 DIAGNOSIS — M2560 Stiffness of unspecified joint, not elsewhere classified: Secondary | ICD-10-CM | POA: Diagnosis not present

## 2021-04-20 DIAGNOSIS — R2689 Other abnormalities of gait and mobility: Secondary | ICD-10-CM | POA: Diagnosis not present

## 2021-04-20 DIAGNOSIS — R531 Weakness: Secondary | ICD-10-CM | POA: Diagnosis not present

## 2021-04-20 MED ORDER — RIVAROXABAN 20 MG PO TABS
20.0000 mg | ORAL_TABLET | Freq: Every day | ORAL | 5 refills | Status: DC
  Filled 2021-04-20 (×2): qty 30, 30d supply, fill #0
  Filled 2021-04-25: qty 30, 30d supply, fill #1
  Filled 2021-06-14: qty 30, 30d supply, fill #2
  Filled 2021-07-19: qty 30, 30d supply, fill #3
  Filled 2021-08-27: qty 30, 30d supply, fill #4

## 2021-04-20 NOTE — Progress Notes (Signed)
MRN : [id number]  Alexander Rojas is a 65 y.o. (08-26-1956) male who presents with chief complaint of  Chief Complaint  Patient presents with   Follow-up    2 wk [facility] post Peripheral throm   .  History of Present Illness: Patient returns today in follow up of his left leg DVT.  It is doing great.  His leg is much better after thrombectomy 2 weeks ago.  He is having less swelling in both legs after beginning to use compression socks which he got about a week ago and has worn regularly.  He is very pleased with the socks and the result of his thrombectomy.  No periprocedural complications.  His access incision is well-healed.  Current Outpatient Medications  Medication Sig Dispense Refill   Abaloparatide (TYMLOS) 3120 MCG/1.56ML SOPN Inject 80 mcg subcutaneously once daily 1.56 mL 11   calcium-vitamin D (OSCAL WITH D) 500-200 MG-UNIT tablet Take 1 tablet by mouth daily. 30 tablet 0   hydrochlorothiazide (HYDRODIURIL) 12.5 MG tablet      hydrochlorothiazide (MICROZIDE) 12.5 MG capsule Take 1 capsule (12.5 mg total) by mouth once daily 90 capsule 1   Insulin Pen Needle (BD PEN NEEDLE NANO 2ND GEN) 32G X 4 MM MISC use as directed 100 each 2   levothyroxine (SYNTHROID) 100 MCG tablet Take 100 mcg by mouth daily before breakfast.      levothyroxine (SYNTHROID) 100 MCG tablet Take 1 tablet (100 mcg total) by mouth once daily on an empty stomach with a glass of water at least 30-60 minutes before breakfast. 90 tablet 1   losartan (COZAAR) 50 MG tablet Take 50 mg by mouth daily.     naltrexone (DEPADE) 50 MG tablet      pantoprazole (PROTONIX) 40 MG tablet TAKE 1 TABLET BY MOUTH ONCE DAILY 90 tablet 1   QC Alcohol Swabs 70 % PADS Apply 1 each topically once daily 100 each 2   Rivaroxaban (XARELTO) 15 MG TABS tablet Take 1 tablet (15 mg total) by mouth 2 (two) times daily with a meal. 42 tablet 0   rivaroxaban (XARELTO) 20 MG TABS tablet Take 1 tablet (20 mg total) by mouth daily with supper. 30  tablet 5   No current facility-administered medications for this visit.    Past Medical History:  Diagnosis Date   Back pain    BPH (benign prostatic hyperplasia)    Extensor tendon laceration, finger, open wound, initial encounter    left thumb   Hypertension    Hypothyroidism    Macrocytosis    Numbness and tingling    Pars defect with spondylolisthesis     Past Surgical History:  Procedure Laterality Date   ABDOMINAL EXPOSURE N/A 09/19/2020   Procedure: ABDOMINAL EXPOSURE;  Surgeon: Rosetta Posner, MD;  Location: [facility];  Service: Vascular;  Laterality: N/A;  anterior   ANTERIOR LUMBAR FUSION N/A 09/19/2020   Procedure: Anterior Lumbar Interbody Fusion Lumbar Five-Sacral One, posterior lateral fusion with gill decompression Lumbar Five-Sacral One.;  Surgeon: Eustace Moore, MD;  Location: [place];  Service: Neurosurgery;  Laterality: N/A;  anterior   APPLICATION OF INTRAOPERATIVE CT SCAN N/A 09/27/2020   Procedure: APPLICATION OF INTRAOPERATIVE CT SCAN;  Surgeon: Eustace Moore, MD;  Location: [place];  Service: Neurosurgery;  Laterality: N/A;   COLONOSCOPY WITH PROPOFOL N/A 12/18/2018   Procedure: COLONOSCOPY WITH BIOPSY;  Surgeon: Lucilla Lame, MD;  Location: [place];  Service: Endoscopy;  Laterality: N/A;  NEEDS TO  STAY FIRST   ESOPHAGOGASTRODUODENOSCOPY     ESOPHAGOGASTRODUODENOSCOPY (EGD) WITH PROPOFOL N/A 02/23/2019   Procedure: ESOPHAGOGASTRODUODENOSCOPY (EGD) WITH PROPOFOL;  Surgeon: Lucilla Lame, MD;  Location: [facility];  Service: Endoscopy;  Laterality: N/A;   HERNIA REPAIR     LAMINECTOMY WITH POSTERIOR LATERAL ARTHRODESIS LEVEL 1 N/A 09/19/2020   Procedure: LAMINECTOMY WITH POSTERIOR LATERAL ARTHRODESIS LEVEL 1;  Surgeon: Eustace Moore, MD;  Location: [place];  Service: Neurosurgery;  Laterality: N/A;  posterior   LAMINECTOMY WITH POSTERIOR LATERAL ARTHRODESIS LEVEL 2 N/A 09/27/2020   Procedure: Lumbar exploration with Extension of Posterior Instrumentation  Lumbar Four to the [place] with Airo;  Surgeon: Eustace Moore, MD;  Location: [place];  Service: Neurosurgery;  Laterality: N/A;   LIPOMA EXCISION Right    upper arm   PERIPHERAL VASCULAR THROMBECTOMY Left 04/05/2021   Procedure: PERIPHERAL VASCULAR THROMBECTOMY;  Surgeon: Algernon Huxley, MD;  Location: [place] [facility];  Service: Cardiovascular;  Laterality: Left;   POLYPECTOMY N/A 12/18/2018   Procedure: POLYPECTOMY;  Surgeon: Lucilla Lame, MD;  Location: [place];  Service: Endoscopy;  Laterality: N/A;   TENDON REPAIR Left 11/05/2019   Procedure: LEFT THUMB TENDON REPAIR;  Surgeon: Cindra Presume, MD;  Location: [place];  Service: Plastics;  Laterality: Left;  Axillary block, MAC   UMBILICAL HERNIA REPAIR       Social History   Tobacco Use   Smoking status: Never   Smokeless tobacco: Never  Vaping Use   Vaping Use: Never used  Substance Use Topics   Alcohol use: Not Currently   Drug use: Never      No family history on file.   Allergies  Allergen Reactions   Alendronate Sodium     REVIEW OF SYSTEMS (Negative unless checked)   Constitutional: _0 Weight loss  _1 Fever  _2 Chills Cardiac: _3 Chest pain   _4 Chest pressure   _5 Palpitations   _6 Shortness of breath when laying flat   _7 Shortness of breath at rest   _8 Shortness of breath with exertion. Vascular:  _9 Pain in legs with walking   _10 Pain in legs at rest   _11 Pain in legs when laying flat   _12 Claudication   _13 Pain in feet when walking  _14 Pain in feet at rest  _15 Pain in feet when laying flat   _16 History of DVT   _17 Phlebitis   _18 Swelling in legs   _19 Varicose veins   _20 Non-healing ulcers Pulmonary:   _21 Uses home oxygen   _22 Productive cough   _23 Hemoptysis   _24 Wheeze  _25 COPD   _26 Asthma Neurologic:  _27 Dizziness  _28 Blackouts   _29 Seizures   _30 History of stroke   _31 History of TIA  _32 Aphasia   _33 Temporary blindness   _34 Dysphagia   _35 Weakness or numbness in arms   _36 Weakness or numbness in  legs Musculoskeletal:  _37 Arthritis   _38 Joint swelling   _39 Joint pain   _40 Low back pain Hematologic:  _41 Easy bruising  _42 Easy bleeding   _43 Hypercoagulable state   _44 Anemic  _45 Hepatitis Gastrointestinal:  _46 Blood in stool   _47 Vomiting blood  _48 Gastroesophageal reflux/heartburn   _49 Abdominal pain Genitourinary:  _50 Chronic kidney disease   _51 Difficult urination  _52 Frequent urination  _53 Burning with urination   _54 Hematuria Skin:  _55 Rashes   _56 Ulcers   _57 Wounds Psychological:  _58 History of anxiety   _59  History of major depression.  Physical Examination  BP 122/79   Pulse 67   Ht _60  (1.753 m)   Wt 186 lb (84.4 kg)   BMI 27.47 kg/m  Gen:  WD/WN,  NAD Head: [place]/AT, No temporalis wasting. Ear/Nose/Throat: Hearing grossly intact, nares w/o erythema or drainage Eyes: Conjunctiva clear. Sclera non-icteric Neck: Supple.  Trachea midline Pulmonary:  Good air movement, no use of accessory muscles.  Cardiac: RRR, no JVD Vascular:  Vessel Right Left  Radial Palpable Palpable       Musculoskeletal: M/S 5/5 throughout.  No deformity or atrophy.  Essentially no lower extremity edema present today. Neurologic: Sensation grossly intact in extremities.  Symmetrical.  Speech is fluent.  Psychiatric: Judgment intact, Mood & affect appropriate for pt's clinical situation. Dermatologic: No rashes or ulcers noted.  No cellulitis or open wounds.      Labs Recent Results (from the past 2160 hour(s))  T4, free     Status: None   Collection Time: 03/12/21  4:07 PM  Result Value Ref Range   Free T4 1.48 0.82 - 1.77 ng/dL  T3, free     Status: None   Collection Time: 03/12/21  4:07 PM  Result Value Ref Range   T3, Free 2.4 2.0 - 4.4 pg/mL  TSH     Status: None   Collection Time: 03/12/21  4:07 PM  Result Value Ref Range   TSH 1.840 0.450 - 4.500 uIU/mL    Radiology PERIPHERAL VASCULAR CATHETERIZATION  Result Date: 04/05/2021 See surgical note for result.  VAS [place] LOWER EXTREMITY VENOUS  (DVT)  Result Date: 04/03/2021  Lower Venous DVT Study Patient Name:  Alexander Rojas  Date of Exam:   04/03/2021 Medical Rec #: [id number]       Accession #:    [id number] Date of Birth: 24-Feb-1956        Patient Gender: M Patient Age:   65Y Exam Location:  [facility] Procedure:      VAS [place] LOWER EXTREMITY VENOUS (DVT) Referring Phys: [id number] [place] --------------------------------------------------------------------------------  Indications: Pain, Swelling, and left.  Performing Technologist: Concha Norway RVT  Examination Guidelines: A complete evaluation includes B-mode imaging, spectral Doppler, color Doppler, and power Doppler as needed of all accessible portions of each vessel. Bilateral testing is considered an integral part of a complete examination. Limited examinations for reoccurring indications may be performed as noted. The reflux portion of the exam is performed with the patient in reverse Trendelenburg.  +---------+---------------+---------+-----------+----------+--------------+ RIGHT    CompressibilityPhasicitySpontaneityPropertiesThrombus Aging +---------+---------------+---------+-----------+----------+--------------+ CFV      Full           Yes      Yes                                 +---------+---------------+---------+-----------+----------+--------------+ SFJ      Full           Yes      Yes                                 +---------+---------------+---------+-----------+----------+--------------+ FV Prox  Full           Yes      Yes                                 +---------+---------------+---------+-----------+----------+--------------+ FV Mid   Full           Yes      Yes                                 +---------+---------------+---------+-----------+----------+--------------+  FV DistalFull           Yes      Yes                                 +---------+---------------+---------+-----------+----------+--------------+ PFV       Full           Yes      Yes                                 +---------+---------------+---------+-----------+----------+--------------+ POP      Full           Yes      Yes                                 +---------+---------------+---------+-----------+----------+--------------+ PTV      Full           Yes      Yes                                 +---------+---------------+---------+-----------+----------+--------------+ PERO     Full           Yes      Yes                                 +---------+---------------+---------+-----------+----------+--------------+ Gastroc  Full           Yes      Yes                                 +---------+---------------+---------+-----------+----------+--------------+ GSV      Full           Yes      Yes                                 +---------+---------------+---------+-----------+----------+--------------+ SSV      Full                                                        +---------+---------------+---------+-----------+----------+--------------+   +---------+---------------+---------+-----------+----------+--------------+ LEFT     CompressibilityPhasicitySpontaneityPropertiesThrombus Aging +---------+---------------+---------+-----------+----------+--------------+ CFV      Full           Yes      Yes                                 +---------+---------------+---------+-----------+----------+--------------+ SFJ      Full           Yes      Yes                                 +---------+---------------+---------+-----------+----------+--------------+ FV Prox  None                                                        +---------+---------------+---------+-----------+----------+--------------+  FV Mid   None                                                        +---------+---------------+---------+-----------+----------+--------------+ FV DistalNone                                                         +---------+---------------+---------+-----------+----------+--------------+ PFV      Full           Yes      Yes                                 +---------+---------------+---------+-----------+----------+--------------+ POP      None                                                        +---------+---------------+---------+-----------+----------+--------------+ PTV      Full                                                        +---------+---------------+---------+-----------+----------+--------------+ PERO     Full                                                        +---------+---------------+---------+-----------+----------+--------------+ Gastroc  Full                                                        +---------+---------------+---------+-----------+----------+--------------+ GSV      Full           Yes      Yes                                 +---------+---------------+---------+-----------+----------+--------------+     Summary: RIGHT: - No evidence of deep vein thrombosis in the lower extremity. No indirect evidence of obstruction proximal to the inguinal ligament.  LEFT: - Findings consistent with acute deep vein thrombosis involving the left femoral vein, and left popliteal vein. - No flow seen in femoral vein from proximal to popliteal vein. Calf vessels are patent. EIV/CFV is compressible and patent.  *See table(s) above for measurements and observations. Electronically signed by Leotis Pain MD on 04/03/2021 at 5:16:35 PM.    Final     Assessment/Plan   Essential hypertension blood pressure control important in reducing the progression of atherosclerotic disease. On appropriate oral medications.     Ulnar  nerve entrapment syndrome, left S/p recent surgery and this caused some immobility which may have led to the DVT  DVT (deep venous thrombosis) (HCC) Marked improvement after venous thrombectomy of the left leg.  Swelling is  essentially gone at this point.  Wearing compression stockings is helping as well.  He is going to continue activity and wearing his compression socks.  Were going to do at least 6 months of full anticoagulation and then at least a lower dose of anticoagulation versus continued full anticoagulation can be considered.    Leotis Pain, MD  04/20/2021 10:29 AM    This note was created with Dragon medical transcription system.  Any errors from dictation are purely unintentional

## 2021-04-20 NOTE — Assessment & Plan Note (Signed)
Marked improvement after venous thrombectomy of the left leg.  Swelling is essentially gone at this point.  Wearing compression stockings is helping as well.  He is going to continue activity and wearing his compression socks.  Were going to do at least 6 months of full anticoagulation and then at least a lower dose of anticoagulation versus continued full anticoagulation can be considered.

## 2021-04-24 DIAGNOSIS — R2689 Other abnormalities of gait and mobility: Secondary | ICD-10-CM | POA: Diagnosis not present

## 2021-04-24 DIAGNOSIS — R531 Weakness: Secondary | ICD-10-CM | POA: Diagnosis not present

## 2021-04-24 DIAGNOSIS — M545 Low back pain, unspecified: Secondary | ICD-10-CM | POA: Diagnosis not present

## 2021-04-24 DIAGNOSIS — M2560 Stiffness of unspecified joint, not elsewhere classified: Secondary | ICD-10-CM | POA: Diagnosis not present

## 2021-04-25 ENCOUNTER — Other Ambulatory Visit (HOSPITAL_COMMUNITY): Payer: Self-pay

## 2021-04-25 ENCOUNTER — Other Ambulatory Visit: Payer: Self-pay

## 2021-04-26 ENCOUNTER — Other Ambulatory Visit (HOSPITAL_COMMUNITY): Payer: Self-pay

## 2021-04-26 DIAGNOSIS — M545 Low back pain, unspecified: Secondary | ICD-10-CM | POA: Diagnosis not present

## 2021-04-26 DIAGNOSIS — M2560 Stiffness of unspecified joint, not elsewhere classified: Secondary | ICD-10-CM | POA: Diagnosis not present

## 2021-04-26 DIAGNOSIS — R2689 Other abnormalities of gait and mobility: Secondary | ICD-10-CM | POA: Diagnosis not present

## 2021-04-26 DIAGNOSIS — R531 Weakness: Secondary | ICD-10-CM | POA: Diagnosis not present

## 2021-04-27 ENCOUNTER — Other Ambulatory Visit (HOSPITAL_COMMUNITY): Payer: Self-pay

## 2021-04-27 DIAGNOSIS — M25521 Pain in right elbow: Secondary | ICD-10-CM | POA: Diagnosis not present

## 2021-04-27 DIAGNOSIS — R531 Weakness: Secondary | ICD-10-CM | POA: Diagnosis not present

## 2021-04-27 DIAGNOSIS — Z4789 Encounter for other orthopedic aftercare: Secondary | ICD-10-CM | POA: Diagnosis not present

## 2021-04-27 DIAGNOSIS — G5622 Lesion of ulnar nerve, left upper limb: Secondary | ICD-10-CM | POA: Diagnosis not present

## 2021-05-01 DIAGNOSIS — Z4789 Encounter for other orthopedic aftercare: Secondary | ICD-10-CM | POA: Diagnosis not present

## 2021-05-01 DIAGNOSIS — R2689 Other abnormalities of gait and mobility: Secondary | ICD-10-CM | POA: Diagnosis not present

## 2021-05-01 DIAGNOSIS — M2560 Stiffness of unspecified joint, not elsewhere classified: Secondary | ICD-10-CM | POA: Diagnosis not present

## 2021-05-01 DIAGNOSIS — R531 Weakness: Secondary | ICD-10-CM | POA: Diagnosis not present

## 2021-05-01 DIAGNOSIS — M545 Low back pain, unspecified: Secondary | ICD-10-CM | POA: Diagnosis not present

## 2021-05-01 DIAGNOSIS — M25521 Pain in right elbow: Secondary | ICD-10-CM | POA: Diagnosis not present

## 2021-05-01 DIAGNOSIS — G5622 Lesion of ulnar nerve, left upper limb: Secondary | ICD-10-CM | POA: Diagnosis not present

## 2021-05-04 DIAGNOSIS — G5622 Lesion of ulnar nerve, left upper limb: Secondary | ICD-10-CM | POA: Diagnosis not present

## 2021-05-04 DIAGNOSIS — R531 Weakness: Secondary | ICD-10-CM | POA: Diagnosis not present

## 2021-05-04 DIAGNOSIS — R2689 Other abnormalities of gait and mobility: Secondary | ICD-10-CM | POA: Diagnosis not present

## 2021-05-04 DIAGNOSIS — Z4789 Encounter for other orthopedic aftercare: Secondary | ICD-10-CM | POA: Diagnosis not present

## 2021-05-04 DIAGNOSIS — M25521 Pain in right elbow: Secondary | ICD-10-CM | POA: Diagnosis not present

## 2021-05-04 DIAGNOSIS — M2560 Stiffness of unspecified joint, not elsewhere classified: Secondary | ICD-10-CM | POA: Diagnosis not present

## 2021-05-04 DIAGNOSIS — M545 Low back pain, unspecified: Secondary | ICD-10-CM | POA: Diagnosis not present

## 2021-05-08 DIAGNOSIS — M545 Low back pain, unspecified: Secondary | ICD-10-CM | POA: Diagnosis not present

## 2021-05-08 DIAGNOSIS — R2689 Other abnormalities of gait and mobility: Secondary | ICD-10-CM | POA: Diagnosis not present

## 2021-05-08 DIAGNOSIS — Z4789 Encounter for other orthopedic aftercare: Secondary | ICD-10-CM | POA: Diagnosis not present

## 2021-05-08 DIAGNOSIS — M2560 Stiffness of unspecified joint, not elsewhere classified: Secondary | ICD-10-CM | POA: Diagnosis not present

## 2021-05-08 DIAGNOSIS — R531 Weakness: Secondary | ICD-10-CM | POA: Diagnosis not present

## 2021-05-08 DIAGNOSIS — G5622 Lesion of ulnar nerve, left upper limb: Secondary | ICD-10-CM | POA: Diagnosis not present

## 2021-05-08 DIAGNOSIS — M25521 Pain in right elbow: Secondary | ICD-10-CM | POA: Diagnosis not present

## 2021-05-09 ENCOUNTER — Other Ambulatory Visit: Payer: Self-pay

## 2021-05-09 ENCOUNTER — Encounter: Payer: Self-pay | Admitting: Physical Medicine and Rehabilitation

## 2021-05-09 ENCOUNTER — Encounter: Payer: 59 | Attending: Registered Nurse | Admitting: Physical Medicine and Rehabilitation

## 2021-05-09 VITALS — BP 126/84 | HR 60 | Temp 97.9°F | Ht 69.0 in | Wt 191.0 lb

## 2021-05-09 DIAGNOSIS — M7989 Other specified soft tissue disorders: Secondary | ICD-10-CM | POA: Diagnosis not present

## 2021-05-09 DIAGNOSIS — M792 Neuralgia and neuritis, unspecified: Secondary | ICD-10-CM | POA: Diagnosis not present

## 2021-05-09 DIAGNOSIS — M79662 Pain in left lower leg: Secondary | ICD-10-CM | POA: Insufficient documentation

## 2021-05-09 DIAGNOSIS — M5416 Radiculopathy, lumbar region: Secondary | ICD-10-CM | POA: Insufficient documentation

## 2021-05-09 DIAGNOSIS — I824Y2 Acute embolism and thrombosis of unspecified deep veins of left proximal lower extremity: Secondary | ICD-10-CM | POA: Diagnosis not present

## 2021-05-09 NOTE — Progress Notes (Addendum)
Subjective:    Patient ID: Alexander Rojas, male    DOB: 1956-09-08, 65 y.o.   MRN: [id number]  HPI Pt is a 65 yr old s/p lumbar fusion for lumbar radiculopathy, sciatic neuropathy and L ulnar neuropathy here for f/u on chronic back issues and L ulnar neuropathy and chronic nerve pain and back pain. .  Pain at rest 1-3/10- however gets up to 7-8/10 at times with movement-  No particular movement that sets it off- some days same activity can cause mild to severe pain-   Had ulnar nerve palsy- hasn't resulted in any improvement in sensation of L. Was done in general- and was harder to walk around after surgery- 5-7 days after ulnar surgery.   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  Doing deep tissue massage- hurts when done.  Can't say it's helped, but not hurt.  2 hours 2x/day- OT for hand strength and PT for back issues.  Thinks has reached plateau otherwise, so they want her to do dry needling.   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 Has tried Sock butler- doesn't help- getting it on.  If sits or stands for any length of time- starts to hurt-  So tries to not sit for more than 20 minutes at a time and walking 3x/day 1/2 mile at a time (uses treadmill in afternoon, but outside the other 2 visits). Don't think pain is from swelling  At the end of day has trace pedal edema- if wears compression stockings; if doesn't wear them, then swelling is 2+ edema.  Not muscle tightness- is constant sharp pain- not too bad but knows it means DVT.   Tymlos- 80 mcg daily- injection- taking daily at least until the end of year. 35% reduction in long bone reduction in post  menopausal women over 1 year (but insurance agreed to cover some).  $3000/month   Feet are improved in pain- maybe every 2-3 days- will get random shocks of pain- 2-3x in a hour- then 2-3 days til occurs again- not constant anymore.  Still numb in 1st-2nd toe webspace is numb/parasthetic.    Is on 6 mg Low dose naltrexone-      Pain Inventory  Average Pain 3 Pain Right Now 1 My pain is  not sure  In the last 24 hours, has pain interfered with the following? General activity 3 Relation with others 3 Enjoyment of life 4 What TIME of day is your pain at its worst? daytime Sleep (in general) Good  Pain is worse with: bending, sitting, standing, and some activites Pain improves with: therapy/exercise Relief from Meds: 0  No family history on file. Social History   Socioeconomic History   Marital status: Married    Spouse name: Not on file   Number of children: Not on file   Years of education: Not on file   Highest education level: Not on file  Occupational History   Not on file  Tobacco Use   Smoking status: Never   Smokeless tobacco: Never  Vaping Use   Vaping Use: Never used  Substance and Sexual Activity   Alcohol use: Not Currently  Drug use: Never   Sexual activity: Not Currently  Other Topics Concern   Not on file  Social History Narrative   Wife lives pt   Social Determinants of Health   Financial Resource Strain: Not on file  Food Insecurity: Not on file  Transportation Needs: Not on file  Physical Activity: Not on file  Stress: Not on file  Social Connections: Not on file   Past Surgical History:  Procedure Laterality Date   ABDOMINAL EXPOSURE N/A 09/19/2020   Procedure: ABDOMINAL EXPOSURE;  Surgeon: Rosetta Posner, MD;  Location: [place];  Service: Vascular;  Laterality: N/A;  anterior   ANTERIOR LUMBAR FUSION N/A 09/19/2020   Procedure: Anterior Lumbar Interbody Fusion Lumbar Five-Sacral One, posterior lateral fusion with gill decompression  Lumbar Five-Sacral One.;  Surgeon: Eustace Moore, MD;  Location: [place];  Service: Neurosurgery;  Laterality: N/A;  anterior   APPLICATION OF INTRAOPERATIVE CT SCAN N/A 09/27/2020   Procedure: APPLICATION OF INTRAOPERATIVE CT SCAN;  Surgeon: Eustace Moore, MD;  Location: [place];  Service: Neurosurgery;  Laterality: N/A;   COLONOSCOPY WITH PROPOFOL N/A 12/18/2018   Procedure: COLONOSCOPY WITH BIOPSY;  Surgeon: Lucilla Lame, MD;  Location: [place];  Service: Endoscopy;  Laterality: N/A;  NEEDS TO STAY FIRST   ESOPHAGOGASTRODUODENOSCOPY     ESOPHAGOGASTRODUODENOSCOPY (EGD) WITH PROPOFOL N/A 02/23/2019   Procedure: ESOPHAGOGASTRODUODENOSCOPY (EGD) WITH PROPOFOL;  Surgeon: Lucilla Lame, MD;  Location: [facility];  Service: Endoscopy;  Laterality: N/A;   HERNIA REPAIR     LAMINECTOMY WITH POSTERIOR LATERAL ARTHRODESIS LEVEL 1 N/A 09/19/2020   Procedure: LAMINECTOMY WITH POSTERIOR LATERAL ARTHRODESIS LEVEL 1;  Surgeon: Eustace Moore, MD;  Location: [place];  Service: Neurosurgery;  Laterality: N/A;  posterior   LAMINECTOMY WITH POSTERIOR LATERAL ARTHRODESIS LEVEL 2 N/A 09/27/2020   Procedure: Lumbar exploration with Extension of Posterior Instrumentation Lumbar Four to the Ilium with Airo;  Surgeon: Eustace Moore, MD;  Location: [place];  Service: Neurosurgery;  Laterality: N/A;   LIPOMA EXCISION Right    upper arm   PERIPHERAL VASCULAR THROMBECTOMY Left 04/05/2021   Procedure: PERIPHERAL VASCULAR THROMBECTOMY;  Surgeon: Algernon Huxley, MD;  Location: [place] [facility];  Service: Cardiovascular;  Laterality: Left;   POLYPECTOMY N/A 12/18/2018   Procedure: POLYPECTOMY;  Surgeon: Lucilla Lame, MD;  Location: [place];  Service: Endoscopy;  Laterality: N/A;   TENDON REPAIR Left 11/05/2019   Procedure: LEFT THUMB TENDON REPAIR;  Surgeon: Cindra Presume, MD;  Location: [place];  Service: Plastics;  Laterality: Left;  Axillary block, MAC   UMBILICAL HERNIA REPAIR      Past Surgical History:  Procedure Laterality Date   ABDOMINAL EXPOSURE N/A 09/19/2020   Procedure: ABDOMINAL EXPOSURE;  Surgeon: Rosetta Posner, MD;  [facility];  Service: Vascular;  Laterality: N/A;  anterior   ANTERIOR LUMBAR FUSION N/A 09/19/2020   Procedure: Anterior Lumbar Interbody Fusion Lumbar Five-Sacral One, posterior lateral fusion with gill decompression Lumbar Five-Sacral One.;  Surgeon: Eustace Moore, MD;  Location: [place];  Service: Neurosurgery;  Laterality: N/A;  anterior   APPLICATION OF INTRAOPERATIVE CT SCAN N/A 09/27/2020   Procedure: APPLICATION OF INTRAOPERATIVE CT SCAN;  Surgeon: Eustace Moore, MD;  Location: [place];  Service: Neurosurgery;  Laterality: N/A;   COLONOSCOPY WITH PROPOFOL N/A 12/18/2018   Procedure: COLONOSCOPY WITH BIOPSY;  Surgeon: Lucilla Lame, MD;  Location: [place];  Service: Endoscopy;  Laterality: N/A;  NEEDS TO  STAY FIRST   ESOPHAGOGASTRODUODENOSCOPY     ESOPHAGOGASTRODUODENOSCOPY (EGD) WITH PROPOFOL N/A 02/23/2019   Procedure: ESOPHAGOGASTRODUODENOSCOPY (EGD) WITH PROPOFOL;  Surgeon: Lucilla Lame, MD;  Location: [facility];  Service: Endoscopy;  Laterality: N/A;   HERNIA REPAIR     LAMINECTOMY WITH POSTERIOR LATERAL ARTHRODESIS LEVEL 1 N/A 09/19/2020   Procedure: LAMINECTOMY WITH POSTERIOR LATERAL ARTHRODESIS LEVEL 1;  Surgeon: Eustace Moore, MD;  Location: [place];  Service: Neurosurgery;  Laterality: N/A;  posterior   LAMINECTOMY WITH POSTERIOR LATERAL ARTHRODESIS LEVEL 2 N/A 09/27/2020   Procedure: Lumbar exploration with Extension of Posterior Instrumentation Lumbar Four to the Ilium with Airo;  Surgeon: Eustace Moore, MD;  Location: [place];  Service: Neurosurgery;  Laterality: N/A;   LIPOMA EXCISION Right    upper arm   PERIPHERAL VASCULAR THROMBECTOMY Left 04/05/2021   Procedure: PERIPHERAL VASCULAR THROMBECTOMY;  Surgeon: Algernon Huxley, MD;  Location: [place] [facility];  Service: Cardiovascular;  Laterality: Left;    POLYPECTOMY N/A 12/18/2018   Procedure: POLYPECTOMY;  Surgeon: Lucilla Lame, MD;  Location: [place];  Service: Endoscopy;  Laterality: N/A;   TENDON REPAIR Left 11/05/2019   Procedure: LEFT THUMB TENDON REPAIR;  Surgeon: Cindra Presume, MD;  Location: [place];  Service: Plastics;  Laterality: Left;  Axillary block, MAC   UMBILICAL HERNIA REPAIR     Past Medical History:  Diagnosis Date   Back pain    BPH (benign prostatic hyperplasia)    Extensor tendon laceration, finger, open wound, initial encounter    left thumb   Hypertension    Hypothyroidism    Macrocytosis    Numbness and tingling    Pars defect with spondylolisthesis    BP 126/84 (BP Location: Right Arm)   Pulse 60   Temp 97.9 F (36.6 C) (Oral)   Ht _0  (1.753 m)   Wt 191 lb (86.6 kg)   SpO2 96%   BMI 28.21 kg/m   Opioid Risk Score:   Fall Risk Score:  `1  Depression screen PHQ 2/9  Depression screen [facility] 2/9 03/12/2021 03/12/2021 01/05/2021 12/11/2020 11/10/2020  Decreased Interest 0 0 - 0 1  Down, Depressed, Hopeless 0 0 0 0 1  PHQ - 2 Score 0 0 0 0 2  Altered sleeping - - 0 - 0  Tired, decreased energy - - - - 1  Change in appetite - - - - 0  Feeling bad or failure about yourself  - - - - 1  Trouble concentrating - - - - 2  Moving slowly or fidgety/restless - - - - 2  Suicidal thoughts - - - - 0  PHQ-9 Score - - 0 - 8      Review of Systems  Constitutional: Negative.   HENT: Negative.    Eyes: Negative.   Respiratory: Negative.    Cardiovascular: Negative.   Gastrointestinal: Negative.   Endocrine: Negative.   Genitourinary: Negative.   Musculoskeletal:  Positive for back pain and gait problem.       Pain in left leg  Skin: Negative.   Allergic/Immunologic: Negative.   Hematological: Negative.   Psychiatric/Behavioral: Negative.        Objective:   Physical Exam  Awake, alert, appears- has gained some weight- up to 191 lbs- still 20 lbs down from initial weight,  sitting on exam table, NAD UE- 5/5 in biceps, triceps, WE, grip and finger abd LE: RLE- HF 5-/5, KE/KF 5/5, DF 4+/5, PF 4+/5 LLE-  5-/5 HF, KE/KF 5/5, DF and PF 5/5  Neuro: Intact to  LT in RUE; decreased in ulnar distribution  on Left hand. No change from previous testing Decreased R 1st-2nd web space on R foot- otherwise intact to LT on LE's.   Gait: walks slightly hunched over throughout gait- low arm swing-  Has trigger points- in rhomboids medial to scapulae and paraspinals B/L       Assessment & Plan:   Pt is a 65 yr old s/p lumbar fusion for lumbar radiculopathy, sciatic neuropathy and L ulnar neuropathy here for f/u on chronic back issues and L ulnar neuropathy and chronic nerve pain and back pain. .  Will try and wean Low dose Naltrexone- called in 2 mg capsules so can wean slowly- can go down or stay at the same dose.   2. Trigger points- tennis balls- 2-4 minutes- holding pressure, no massage.  3. Can also try Theracane- like that brand.   4. Can also try compression socks- 15-20 mm Hg. If need be.   5. Using Elastic Therapy- $8. Compression socks- Take orders over the phone.   6. F/U in 3 months  I spent a total of 38 minutes on visit- basically demonstrating  how ot treat trigger points, and discussing wean of meds as well as all the medical issues going on.

## 2021-05-09 NOTE — Patient Instructions (Signed)
Pt is a 65 yr old s/p lumbar fusion for lumbar radiculopathy, sciatic neuropathy and L ulnar neuropathy here for f/u on chronic back issues and L ulnar neuropathy and chronic nerve pain and back pain. .  Will try and wean Low dose Naltrexone- called in 2 mg capsules so can wean slowly- can go down or stay at the same dose.   2. Trigger points- tennis balls- 2-4 minutes- holding pressure, no massage.  3. Can also try Theracane- like that brand.   4. Can also try compression socks- 15-20 mm Hg. If need be.   5. Using Elastic Therapy- $8. Compression socks- Take orders over the phone.   6. F/U in 3 months

## 2021-05-11 DIAGNOSIS — M2560 Stiffness of unspecified joint, not elsewhere classified: Secondary | ICD-10-CM | POA: Diagnosis not present

## 2021-05-11 DIAGNOSIS — G5622 Lesion of ulnar nerve, left upper limb: Secondary | ICD-10-CM | POA: Diagnosis not present

## 2021-05-11 DIAGNOSIS — M25521 Pain in right elbow: Secondary | ICD-10-CM | POA: Diagnosis not present

## 2021-05-11 DIAGNOSIS — M545 Low back pain, unspecified: Secondary | ICD-10-CM | POA: Diagnosis not present

## 2021-05-11 DIAGNOSIS — R2689 Other abnormalities of gait and mobility: Secondary | ICD-10-CM | POA: Diagnosis not present

## 2021-05-11 DIAGNOSIS — Z4789 Encounter for other orthopedic aftercare: Secondary | ICD-10-CM | POA: Diagnosis not present

## 2021-05-11 DIAGNOSIS — R531 Weakness: Secondary | ICD-10-CM | POA: Diagnosis not present

## 2021-05-14 ENCOUNTER — Other Ambulatory Visit (HOSPITAL_COMMUNITY): Payer: Self-pay

## 2021-05-15 DIAGNOSIS — Z4789 Encounter for other orthopedic aftercare: Secondary | ICD-10-CM | POA: Diagnosis not present

## 2021-05-15 DIAGNOSIS — M2560 Stiffness of unspecified joint, not elsewhere classified: Secondary | ICD-10-CM | POA: Diagnosis not present

## 2021-05-15 DIAGNOSIS — M545 Low back pain, unspecified: Secondary | ICD-10-CM | POA: Diagnosis not present

## 2021-05-15 DIAGNOSIS — G5622 Lesion of ulnar nerve, left upper limb: Secondary | ICD-10-CM | POA: Diagnosis not present

## 2021-05-15 DIAGNOSIS — R531 Weakness: Secondary | ICD-10-CM | POA: Diagnosis not present

## 2021-05-15 DIAGNOSIS — R2689 Other abnormalities of gait and mobility: Secondary | ICD-10-CM | POA: Diagnosis not present

## 2021-05-15 DIAGNOSIS — M25521 Pain in right elbow: Secondary | ICD-10-CM | POA: Diagnosis not present

## 2021-05-18 DIAGNOSIS — M545 Low back pain, unspecified: Secondary | ICD-10-CM | POA: Diagnosis not present

## 2021-05-18 DIAGNOSIS — G5622 Lesion of ulnar nerve, left upper limb: Secondary | ICD-10-CM | POA: Diagnosis not present

## 2021-05-18 DIAGNOSIS — R2689 Other abnormalities of gait and mobility: Secondary | ICD-10-CM | POA: Diagnosis not present

## 2021-05-18 DIAGNOSIS — Z4789 Encounter for other orthopedic aftercare: Secondary | ICD-10-CM | POA: Diagnosis not present

## 2021-05-18 DIAGNOSIS — M2560 Stiffness of unspecified joint, not elsewhere classified: Secondary | ICD-10-CM | POA: Diagnosis not present

## 2021-05-18 DIAGNOSIS — R531 Weakness: Secondary | ICD-10-CM | POA: Diagnosis not present

## 2021-05-18 DIAGNOSIS — M25521 Pain in right elbow: Secondary | ICD-10-CM | POA: Diagnosis not present

## 2021-05-22 ENCOUNTER — Other Ambulatory Visit (HOSPITAL_COMMUNITY): Payer: Self-pay

## 2021-05-22 ENCOUNTER — Other Ambulatory Visit: Payer: Self-pay

## 2021-05-22 DIAGNOSIS — R2689 Other abnormalities of gait and mobility: Secondary | ICD-10-CM | POA: Diagnosis not present

## 2021-05-22 DIAGNOSIS — M545 Low back pain, unspecified: Secondary | ICD-10-CM | POA: Diagnosis not present

## 2021-05-22 DIAGNOSIS — Z4789 Encounter for other orthopedic aftercare: Secondary | ICD-10-CM | POA: Diagnosis not present

## 2021-05-22 DIAGNOSIS — R531 Weakness: Secondary | ICD-10-CM | POA: Diagnosis not present

## 2021-05-22 DIAGNOSIS — M25521 Pain in right elbow: Secondary | ICD-10-CM | POA: Diagnosis not present

## 2021-05-22 DIAGNOSIS — G5622 Lesion of ulnar nerve, left upper limb: Secondary | ICD-10-CM | POA: Diagnosis not present

## 2021-05-22 DIAGNOSIS — M2560 Stiffness of unspecified joint, not elsewhere classified: Secondary | ICD-10-CM | POA: Diagnosis not present

## 2021-05-22 MED ORDER — PANTOPRAZOLE SODIUM 40 MG PO TBEC
DELAYED_RELEASE_TABLET | ORAL | 1 refills | Status: DC
  Filled 2021-05-22 – 2021-06-18 (×2): qty 90, 90d supply, fill #0

## 2021-05-24 DIAGNOSIS — R531 Weakness: Secondary | ICD-10-CM | POA: Diagnosis not present

## 2021-05-24 DIAGNOSIS — M25521 Pain in right elbow: Secondary | ICD-10-CM | POA: Diagnosis not present

## 2021-05-24 DIAGNOSIS — R2689 Other abnormalities of gait and mobility: Secondary | ICD-10-CM | POA: Diagnosis not present

## 2021-05-24 DIAGNOSIS — M545 Low back pain, unspecified: Secondary | ICD-10-CM | POA: Diagnosis not present

## 2021-05-24 DIAGNOSIS — M2560 Stiffness of unspecified joint, not elsewhere classified: Secondary | ICD-10-CM | POA: Diagnosis not present

## 2021-05-24 DIAGNOSIS — Z4789 Encounter for other orthopedic aftercare: Secondary | ICD-10-CM | POA: Diagnosis not present

## 2021-05-24 DIAGNOSIS — G5622 Lesion of ulnar nerve, left upper limb: Secondary | ICD-10-CM | POA: Diagnosis not present

## 2021-05-29 ENCOUNTER — Other Ambulatory Visit (HOSPITAL_COMMUNITY): Payer: Self-pay

## 2021-05-29 DIAGNOSIS — Z4789 Encounter for other orthopedic aftercare: Secondary | ICD-10-CM | POA: Diagnosis not present

## 2021-05-29 DIAGNOSIS — M545 Low back pain, unspecified: Secondary | ICD-10-CM | POA: Diagnosis not present

## 2021-05-29 DIAGNOSIS — M2560 Stiffness of unspecified joint, not elsewhere classified: Secondary | ICD-10-CM | POA: Diagnosis not present

## 2021-05-29 DIAGNOSIS — M25521 Pain in right elbow: Secondary | ICD-10-CM | POA: Diagnosis not present

## 2021-05-29 DIAGNOSIS — R531 Weakness: Secondary | ICD-10-CM | POA: Diagnosis not present

## 2021-05-29 DIAGNOSIS — G5622 Lesion of ulnar nerve, left upper limb: Secondary | ICD-10-CM | POA: Diagnosis not present

## 2021-05-29 DIAGNOSIS — R2689 Other abnormalities of gait and mobility: Secondary | ICD-10-CM | POA: Diagnosis not present

## 2021-06-01 DIAGNOSIS — R2689 Other abnormalities of gait and mobility: Secondary | ICD-10-CM | POA: Diagnosis not present

## 2021-06-01 DIAGNOSIS — M25521 Pain in right elbow: Secondary | ICD-10-CM | POA: Diagnosis not present

## 2021-06-01 DIAGNOSIS — Z4789 Encounter for other orthopedic aftercare: Secondary | ICD-10-CM | POA: Diagnosis not present

## 2021-06-01 DIAGNOSIS — G5622 Lesion of ulnar nerve, left upper limb: Secondary | ICD-10-CM | POA: Diagnosis not present

## 2021-06-01 DIAGNOSIS — R531 Weakness: Secondary | ICD-10-CM | POA: Diagnosis not present

## 2021-06-01 DIAGNOSIS — M545 Low back pain, unspecified: Secondary | ICD-10-CM | POA: Diagnosis not present

## 2021-06-01 DIAGNOSIS — Z23 Encounter for immunization: Secondary | ICD-10-CM | POA: Diagnosis not present

## 2021-06-01 DIAGNOSIS — M2560 Stiffness of unspecified joint, not elsewhere classified: Secondary | ICD-10-CM | POA: Diagnosis not present

## 2021-06-05 DIAGNOSIS — R2689 Other abnormalities of gait and mobility: Secondary | ICD-10-CM | POA: Diagnosis not present

## 2021-06-05 DIAGNOSIS — M2560 Stiffness of unspecified joint, not elsewhere classified: Secondary | ICD-10-CM | POA: Diagnosis not present

## 2021-06-05 DIAGNOSIS — M545 Low back pain, unspecified: Secondary | ICD-10-CM | POA: Diagnosis not present

## 2021-06-05 DIAGNOSIS — M25521 Pain in right elbow: Secondary | ICD-10-CM | POA: Diagnosis not present

## 2021-06-05 DIAGNOSIS — G5622 Lesion of ulnar nerve, left upper limb: Secondary | ICD-10-CM | POA: Diagnosis not present

## 2021-06-05 DIAGNOSIS — Z4789 Encounter for other orthopedic aftercare: Secondary | ICD-10-CM | POA: Diagnosis not present

## 2021-06-05 DIAGNOSIS — R531 Weakness: Secondary | ICD-10-CM | POA: Diagnosis not present

## 2021-06-08 DIAGNOSIS — R531 Weakness: Secondary | ICD-10-CM | POA: Diagnosis not present

## 2021-06-08 DIAGNOSIS — M545 Low back pain, unspecified: Secondary | ICD-10-CM | POA: Diagnosis not present

## 2021-06-08 DIAGNOSIS — M2560 Stiffness of unspecified joint, not elsewhere classified: Secondary | ICD-10-CM | POA: Diagnosis not present

## 2021-06-08 DIAGNOSIS — G5622 Lesion of ulnar nerve, left upper limb: Secondary | ICD-10-CM | POA: Diagnosis not present

## 2021-06-08 DIAGNOSIS — R2689 Other abnormalities of gait and mobility: Secondary | ICD-10-CM | POA: Diagnosis not present

## 2021-06-08 DIAGNOSIS — M25521 Pain in right elbow: Secondary | ICD-10-CM | POA: Diagnosis not present

## 2021-06-08 DIAGNOSIS — Z4789 Encounter for other orthopedic aftercare: Secondary | ICD-10-CM | POA: Diagnosis not present

## 2021-06-12 DIAGNOSIS — M810 Age-related osteoporosis without current pathological fracture: Secondary | ICD-10-CM | POA: Diagnosis not present

## 2021-06-12 DIAGNOSIS — E034 Atrophy of thyroid (acquired): Secondary | ICD-10-CM | POA: Diagnosis not present

## 2021-06-12 DIAGNOSIS — E559 Vitamin D deficiency, unspecified: Secondary | ICD-10-CM | POA: Diagnosis not present

## 2021-06-13 ENCOUNTER — Other Ambulatory Visit: Payer: Self-pay

## 2021-06-13 MED ORDER — VITAMIN D (ERGOCALCIFEROL) 1.25 MG (50000 UNIT) PO CAPS
ORAL_CAPSULE | ORAL | 4 refills | Status: AC
  Filled 2021-06-13: qty 13, 90d supply, fill #0
  Filled 2021-09-20: qty 13, 90d supply, fill #1

## 2021-06-14 ENCOUNTER — Other Ambulatory Visit: Payer: Self-pay

## 2021-06-15 ENCOUNTER — Other Ambulatory Visit: Payer: Self-pay

## 2021-06-18 ENCOUNTER — Other Ambulatory Visit: Payer: Self-pay

## 2021-06-19 ENCOUNTER — Other Ambulatory Visit: Payer: Self-pay

## 2021-06-19 MED ORDER — PANTOPRAZOLE SODIUM 40 MG PO TBEC
40.0000 mg | DELAYED_RELEASE_TABLET | Freq: Every day | ORAL | 1 refills | Status: AC
  Filled 2021-06-19 – 2021-09-20 (×2): qty 90, 90d supply, fill #0

## 2021-06-21 ENCOUNTER — Other Ambulatory Visit (HOSPITAL_COMMUNITY): Payer: Self-pay

## 2021-06-21 ENCOUNTER — Other Ambulatory Visit: Payer: Self-pay

## 2021-06-22 ENCOUNTER — Other Ambulatory Visit: Payer: Self-pay

## 2021-06-22 ENCOUNTER — Other Ambulatory Visit (HOSPITAL_COMMUNITY): Payer: Self-pay

## 2021-06-22 MED ORDER — LOSARTAN POTASSIUM 50 MG PO TABS
50.0000 mg | ORAL_TABLET | Freq: Every day | ORAL | 1 refills | Status: AC
  Filled 2021-06-22 – 2021-08-27 (×2): qty 90, 90d supply, fill #0

## 2021-06-26 ENCOUNTER — Other Ambulatory Visit (HOSPITAL_COMMUNITY): Payer: Self-pay

## 2021-06-27 ENCOUNTER — Other Ambulatory Visit (HOSPITAL_COMMUNITY): Payer: Self-pay

## 2021-07-12 DIAGNOSIS — I10 Essential (primary) hypertension: Secondary | ICD-10-CM | POA: Diagnosis not present

## 2021-07-12 DIAGNOSIS — M545 Low back pain, unspecified: Secondary | ICD-10-CM | POA: Diagnosis not present

## 2021-07-12 DIAGNOSIS — Z6827 Body mass index (BMI) 27.0-27.9, adult: Secondary | ICD-10-CM | POA: Diagnosis not present

## 2021-07-17 ENCOUNTER — Other Ambulatory Visit (HOSPITAL_COMMUNITY): Payer: Self-pay

## 2021-07-18 ENCOUNTER — Other Ambulatory Visit (HOSPITAL_COMMUNITY): Payer: Self-pay

## 2021-07-19 ENCOUNTER — Other Ambulatory Visit (HOSPITAL_COMMUNITY): Payer: Self-pay

## 2021-07-23 ENCOUNTER — Telehealth: Payer: Self-pay | Admitting: Physical Medicine and Rehabilitation

## 2021-07-23 NOTE — Telephone Encounter (Signed)
Patient moved from 10/17 to 10/14. Arrived this morning stating he had an appointment. Advised his appointment is on 10/14, he asked a message be sent to Dr Dagoberto Ligas that he stopped by this morning.

## 2021-08-01 ENCOUNTER — Other Ambulatory Visit (HOSPITAL_COMMUNITY): Payer: Self-pay

## 2021-08-03 ENCOUNTER — Encounter: Payer: 59 | Attending: Registered Nurse | Admitting: Physical Medicine and Rehabilitation

## 2021-08-03 ENCOUNTER — Encounter: Payer: Self-pay | Admitting: Physical Medicine and Rehabilitation

## 2021-08-03 ENCOUNTER — Other Ambulatory Visit: Payer: Self-pay

## 2021-08-03 VITALS — BP 121/81 | HR 65 | Temp 98.6°F | Ht 69.0 in | Wt 200.0 lb

## 2021-08-03 DIAGNOSIS — M5416 Radiculopathy, lumbar region: Secondary | ICD-10-CM | POA: Diagnosis not present

## 2021-08-03 DIAGNOSIS — I824Y2 Acute embolism and thrombosis of unspecified deep veins of left proximal lower extremity: Secondary | ICD-10-CM | POA: Insufficient documentation

## 2021-08-03 DIAGNOSIS — G5622 Lesion of ulnar nerve, left upper limb: Secondary | ICD-10-CM | POA: Diagnosis not present

## 2021-08-03 DIAGNOSIS — R531 Weakness: Secondary | ICD-10-CM | POA: Diagnosis not present

## 2021-08-03 DIAGNOSIS — M2539 Other instability, other specified joint: Secondary | ICD-10-CM | POA: Diagnosis not present

## 2021-08-03 DIAGNOSIS — M5459 Other low back pain: Secondary | ICD-10-CM | POA: Diagnosis not present

## 2021-08-03 DIAGNOSIS — M2569 Stiffness of other specified joint, not elsewhere classified: Secondary | ICD-10-CM | POA: Diagnosis not present

## 2021-08-03 NOTE — Progress Notes (Signed)
Subjective:    Patient ID: Alexander Rojas, male    DOB: 15-Jun-1956, 65 y.o.   MRN: [id number]  HPI  Pt is a 65 yr old s/p lumbar fusion for lumbar radiculopathy, sciatic neuropathy and L ulnar neuropathy here for f/u on chronic back issues and L ulnar neuropathy and chronic nerve pain and back pain. Marland Kitchen  Here for f/u on pain and neuropathies.   Is the same.  Stopped the low dose naltrexone- didn't notice any difference going off Low dose Naltrexone.    Has no pain with sitting; but when stands; walks-  Difficulty getting in/out of car- esp due to twisting movements.   Back pain- chronic, but intermittent.  Valsalva maneuver is VERY painful- sneeze or cough, etc- pain so bad, feels like will fall over! Flexion is OK- can now tie shoes.  Back extension is the main problem. Along with twisting.   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   Uses laptop in standing position; sits for 45 minutes then stands for 45 minutes.  Doing 18 credits.   Biggest issue- is the LLE- which has recurrent DVT in- swells, starts to swell and throbs if immobility at all- max 45 minutes.  Then has to lay down to get relief.   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  L hand is no better- much worse with heat- washing dishes, taking shower; just heat coming out of the vents in car, really flares up paraesthesias- burning like on fire.  Still L 4th/5th digit- not pain, but on fire.    Still webspace between 1st/2nd toe B/L feet- R>L- numb- 1-2x/week- gets shooting pains like someone jabbed with needle- over and over- for ~ 1 minute, then go away.   Tried; keppra Cymbalta Gabapentin Lyrica Didn't try Trileptal Tried Low dose Naltrexone Tried Nucynta initially   Completed all PT- now doing yoga therapy- Push self to point of pain, then back off.  Defeated the purpose of yoga.   Went to get TENS PT- thought he was there  for more PT.  Can manage the pain now that not working- can lay down if need be Most of classes online.    Vascular doctor doesn't think it's underlying- and thinks all DVTs were provoked.  Will finish 6 months of Xarelto is end of November- and then go to low dose chronic lifetime.  Worried might have cancer.   2nd DVT was OFF Xarelto.  Vascular extracted DVT from leg- from popliteal to iliacs- thrombectomy.   Can manage swelling when props legs up on pillows for a few hours.    Admits not as happy! Had official retirement party 2 weeks ago.  Realizes it's the end.   CT of back end of December- planned.  Dr Ronnald Ramp wanted to remove the hardware- Dr Genevive Bi wasn't sure wanted to do it.   Goes to [place] to go to football games.  Went a couple of weeks-  Hard to drive himself- worried will get another DVT.        Pain Inventory Average Pain 5 Pain Right Now 2 My pain is dull  In the last 24 hours, has pain interfered with the following? General activity 5 Relation with others 5 Enjoyment of life 5 What TIME of day is your pain at its worst? daytime Sleep (in general) Fair  Pain is worse with: bending, standing, and some activites  Pain improves with: rest Relief from Meds: 0  No family history on file. Social History   Socioeconomic History   Marital status: Married    Spouse name: Not on file   Number of children: Not on file   Years of education: Not on file   Highest education level: Not on file  Occupational History   Not on file  Tobacco Use   Smoking status: Never   Smokeless tobacco: Never  Vaping Use   Vaping Use: Never used  Substance and Sexual Activity   Alcohol use: Not Currently   Drug use: Never   Sexual activity: Not Currently  Other Topics Concern   Not on file  Social History Narrative   Wife lives pt   Social Determinants of Health   Financial Resource Strain: Not on file  Food Insecurity: Not on file  Transportation Needs: Not on  file  Physical Activity: Not on file  Stress: Not on file  Social Connections: Not on file   Past Surgical History:  Procedure Laterality Date   ABDOMINAL EXPOSURE N/A 09/19/2020   Procedure: ABDOMINAL EXPOSURE;  Surgeon: Rosetta Posner, MD;  Location: [place];  Service: Vascular;  Laterality: N/A;  anterior   ANTERIOR LUMBAR FUSION N/A 09/19/2020   Procedure: Anterior Lumbar Interbody Fusion Lumbar Five-Sacral One, posterior lateral fusion with gill decompression Lumbar Five-Sacral One.;  Surgeon: Eustace Moore, MD;  Location: [place];  Service: Neurosurgery;  Laterality: N/A;  anterior   APPLICATION OF INTRAOPERATIVE CT SCAN N/A 09/27/2020   Procedure: APPLICATION OF INTRAOPERATIVE CT SCAN;  Surgeon: Eustace Moore, MD;  Location: [place];  Service: Neurosurgery;  Laterality: N/A;   COLONOSCOPY WITH PROPOFOL N/A 12/18/2018   Procedure: COLONOSCOPY WITH BIOPSY;  Surgeon: Lucilla Lame, MD;  Location: [place];  Service: Endoscopy;  Laterality: N/A;  NEEDS TO STAY FIRST   ESOPHAGOGASTRODUODENOSCOPY     ESOPHAGOGASTRODUODENOSCOPY (EGD) WITH PROPOFOL N/A 02/23/2019   Procedure: ESOPHAGOGASTRODUODENOSCOPY (EGD) WITH PROPOFOL;  Surgeon: Lucilla Lame, MD;  Location: [facility];  Service: Endoscopy;  Laterality: N/A;   HERNIA REPAIR     LAMINECTOMY WITH POSTERIOR LATERAL ARTHRODESIS LEVEL 1 N/A 09/19/2020   Procedure: LAMINECTOMY WITH POSTERIOR LATERAL ARTHRODESIS LEVEL 1;  Surgeon: Eustace Moore, MD;  Location: [place];  Service: Neurosurgery;  Laterality: N/A;  posterior   LAMINECTOMY WITH POSTERIOR LATERAL ARTHRODESIS LEVEL 2 N/A 09/27/2020   Procedure: Lumbar exploration with Extension of Posterior Instrumentation Lumbar Four to the Ilium with Airo;  Surgeon: Eustace Moore, MD;  Location: [place];  Service: Neurosurgery;  Laterality: N/A;   LIPOMA EXCISION Right    upper arm   PERIPHERAL VASCULAR THROMBECTOMY Left 04/05/2021   Procedure: PERIPHERAL VASCULAR THROMBECTOMY;  Surgeon: Algernon Huxley, MD;  Location: [place] [facility];  Service: Cardiovascular;  Laterality: Left;   POLYPECTOMY N/A 12/18/2018   Procedure: POLYPECTOMY;  Surgeon: Lucilla Lame, MD;  Location: [place];  Service: Endoscopy;  Laterality: N/A;   TENDON REPAIR Left 11/05/2019   Procedure: LEFT THUMB TENDON REPAIR;  Surgeon: Cindra Presume, MD;  Location: [place];  Service: Plastics;  Laterality: Left;  Axillary block, MAC   UMBILICAL HERNIA REPAIR     Past Surgical History:  Procedure Laterality Date   ABDOMINAL EXPOSURE N/A 09/19/2020   Procedure: ABDOMINAL EXPOSURE;  Surgeon: Rosetta Posner, MD;  Location: [place];  Service: Vascular;  Laterality: N/A;  anterior   ANTERIOR LUMBAR FUSION N/A 09/19/2020  Procedure: Anterior Lumbar Interbody Fusion Lumbar Five-Sacral One, posterior lateral fusion with gill decompression Lumbar Five-Sacral One.;  Surgeon: Eustace Moore, MD;  Location: [place];  Service: Neurosurgery;  Laterality: N/A;  anterior   APPLICATION OF INTRAOPERATIVE CT SCAN N/A 09/27/2020   Procedure: APPLICATION OF INTRAOPERATIVE CT SCAN;  Surgeon: Eustace Moore, MD;  Location: [place];  Service: Neurosurgery;  Laterality: N/A;   COLONOSCOPY WITH PROPOFOL N/A 12/18/2018   Procedure: COLONOSCOPY WITH BIOPSY;  Surgeon: Lucilla Lame, MD;  Location: [place];  Service: Endoscopy;  Laterality: N/A;  NEEDS TO STAY FIRST   ESOPHAGOGASTRODUODENOSCOPY     ESOPHAGOGASTRODUODENOSCOPY (EGD) WITH PROPOFOL N/A 02/23/2019   Procedure: ESOPHAGOGASTRODUODENOSCOPY (EGD) WITH PROPOFOL;  Surgeon: Lucilla Lame, MD;  Location: [facility];  Service: Endoscopy;  Laterality: N/A;   HERNIA REPAIR     LAMINECTOMY WITH POSTERIOR LATERAL ARTHRODESIS LEVEL 1 N/A 09/19/2020   Procedure: LAMINECTOMY WITH POSTERIOR LATERAL ARTHRODESIS LEVEL 1;  Surgeon: Eustace Moore, MD;  Location: [place];  Service: Neurosurgery;  Laterality: N/A;  posterior   LAMINECTOMY WITH POSTERIOR LATERAL ARTHRODESIS LEVEL 2  N/A 09/27/2020   Procedure: Lumbar exploration with Extension of Posterior Instrumentation Lumbar Four to the Ilium with Airo;  Surgeon: Eustace Moore, MD;  Location: [place];  Service: Neurosurgery;  Laterality: N/A;   LIPOMA EXCISION Right    upper arm   PERIPHERAL VASCULAR THROMBECTOMY Left 04/05/2021   Procedure: PERIPHERAL VASCULAR THROMBECTOMY;  Surgeon: Algernon Huxley, MD;  Location: [place] [facility];  Service: Cardiovascular;  Laterality: Left;   POLYPECTOMY N/A 12/18/2018   Procedure: POLYPECTOMY;  Surgeon: Lucilla Lame, MD;  Location: [place];  Service: Endoscopy;  Laterality: N/A;   TENDON REPAIR Left 11/05/2019   Procedure: LEFT THUMB TENDON REPAIR;  Surgeon: Cindra Presume, MD;  Location: [place];  Service: Plastics;  Laterality: Left;  Axillary block, MAC   UMBILICAL HERNIA REPAIR     Past Medical History:  Diagnosis Date   Back pain    BPH (benign prostatic hyperplasia)    Extensor tendon laceration, finger, open wound, initial encounter    left thumb   Hypertension    Hypothyroidism    Macrocytosis    Numbness and tingling    Pars defect with spondylolisthesis    BP 121/81   Pulse 65   Temp 98.6 F (37 C) (Oral)   Ht _0  (1.753 m)   Wt 200 lb (90.7 kg)   SpO2 97%   BMI 29.53 kg/m   Opioid Risk Score:   Fall Risk Score:  `1  Depression screen PHQ 2/9  Depression screen [facility] 2/9 05/09/2021 03/12/2021 03/12/2021 01/05/2021 12/11/2020 11/10/2020  Decreased Interest 0 0 0 - 0 1  Down, Depressed, Hopeless 0 0 0 0 0 1  PHQ - 2 Score 0 0 0 0 0 2  Altered sleeping 0 - - 0 - 0  Tired, decreased energy - - - - - 1  Change in appetite - - - - - 0  Feeling bad or failure about yourself  - - - - - 1  Trouble concentrating - - - - - 2  Moving slowly or fidgety/restless - - - - - 2  Suicidal thoughts - - - - - 0  PHQ-9 Score 0 - - 0 - 8      Review of Systems  Musculoskeletal:  Positive for back pain.  All other systems reviewed and are  negative.  Objective:   Physical Exam  Awake, alert, appropriate, sitting on table; wearing Compression hose, NAD Very mild ulnar atrophy on L hand Very mild swelling on L 4th digit Significantly decreased sensation of L 4th ulnar aspect and 5th digit.  MS: LLLE HF 5-/5; otherwise 5/5 RLE- 5/5 in same muscles TTP over midline and in a band lumbosacral B/L Flexion is OK Extension and rotation much worse with pain- B/L Hunched posture- slightly lumbar flexed     Assessment & Plan:   Pt is a 65 yr old s/p lumbar fusion- November/December- 2021.  for lumbar radiculopathy, sciatic neuropathy and L ulnar neuropathy here for f/u on chronic back issues and L ulnar neuropathy and chronic nerve pain and back pain. Marland Kitchen Here for neuropathies.    Has many exercises- does daily- 20 minutes daily- occ 2x/day- for lumbar posture- since is flexed at rest.   2. Suggest looking into Pilates. For posture   3. Call Dr Hulan Saas- said could see him after 1 year.  In November.   4.  Insurance is running out- will likely need to change to [facility].    5. TSH is 1.64 on Synthroid- If possible- goal is ~ 1.0- so don't let is get higher.    6. F/U prn as needed  I spent a total of 35 minutes on visit- as detailed above.

## 2021-08-03 NOTE — Patient Instructions (Signed)
Pt is a 65 yr old s/p lumbar fusion- November/December- 2021.  for lumbar radiculopathy, sciatic neuropathy and L ulnar neuropathy here for f/u on chronic back issues and L ulnar neuropathy and chronic nerve pain and back pain. Alexander Rojas Here for neuropathies.    Has many exercises- does daily- 20 minutes daily- occ 2x/day- for lumbar posture- since is flexed at rest.   2. Suggest looking into Pilates. For posture   3. Call Dr Hulan Saas- said could see him after 1 year.  In November.   4.  Insurance is running out- will likely need to change to [facility].    5. TSH is 1.64 on Synthroid- If possible- goal is ~ 1.0- so don't let is get higher.    6. F/U prn as needed

## 2021-08-06 ENCOUNTER — Ambulatory Visit: Payer: 59 | Admitting: Physical Medicine and Rehabilitation

## 2021-08-17 ENCOUNTER — Other Ambulatory Visit (HOSPITAL_COMMUNITY): Payer: Self-pay | Admitting: Neurological Surgery

## 2021-08-17 ENCOUNTER — Other Ambulatory Visit (HOSPITAL_BASED_OUTPATIENT_CLINIC_OR_DEPARTMENT_OTHER): Payer: Self-pay | Admitting: Neurological Surgery

## 2021-08-17 DIAGNOSIS — M545 Low back pain, unspecified: Secondary | ICD-10-CM

## 2021-08-27 ENCOUNTER — Other Ambulatory Visit (HOSPITAL_COMMUNITY): Payer: Self-pay

## 2021-08-28 ENCOUNTER — Other Ambulatory Visit (HOSPITAL_COMMUNITY): Payer: Self-pay

## 2021-08-28 ENCOUNTER — Ambulatory Visit (INDEPENDENT_AMBULATORY_CARE_PROVIDER_SITE_OTHER): Payer: 59 | Admitting: Vascular Surgery

## 2021-08-28 ENCOUNTER — Encounter (INDEPENDENT_AMBULATORY_CARE_PROVIDER_SITE_OTHER): Payer: Self-pay | Admitting: Vascular Surgery

## 2021-08-28 ENCOUNTER — Other Ambulatory Visit: Payer: Self-pay

## 2021-08-28 VITALS — BP 136/83 | HR 64 | Resp 16 | Wt 198.6 lb

## 2021-08-28 DIAGNOSIS — I10 Essential (primary) hypertension: Secondary | ICD-10-CM | POA: Diagnosis not present

## 2021-08-28 DIAGNOSIS — I824Y2 Acute embolism and thrombosis of unspecified deep veins of left proximal lower extremity: Secondary | ICD-10-CM | POA: Diagnosis not present

## 2021-08-28 DIAGNOSIS — I87009 Postthrombotic syndrome without complications of unspecified extremity: Secondary | ICD-10-CM

## 2021-08-28 MED ORDER — RIVAROXABAN 10 MG PO TABS
10.0000 mg | ORAL_TABLET | Freq: Every day | ORAL | 11 refills | Status: AC
  Filled 2021-08-28: qty 30, 30d supply, fill #0

## 2021-08-28 NOTE — Assessment & Plan Note (Signed)
blood pressure control important in reducing the progression of atherosclerotic disease. On appropriate oral medications.  

## 2021-08-28 NOTE — Progress Notes (Addendum)
MRN : [id number]  Alexander Rojas is a 65 y.o. (1956/06/01) male who presents with chief complaint of  Chief Complaint  Patient presents with   Follow-up  .  History of Present Illness: Patient returns today in follow up of his DVT and postphlebitic syndrome.  Overall, he is doing fairly well.  He does have some swelling in the left leg and if he is on it for more than an hour at a time, he has pretty significant pain.  That has really limited him and he cannot really stand for more than an hour at a time.  Long drives have also become problematic because standing or sitting for long periods of time has become more painful.  He diligently wears his compression stockings every day and they are working pretty well keeping the swelling under control.  He is tolerating anticoagulation.  His thrombectomy was about 5 months ago.  Current Outpatient Medications  Medication Sig Dispense Refill   Abaloparatide (TYMLOS) 3120 MCG/1.56ML SOPN Inject 80 mcg subcutaneously once daily 1.56 mL 11   calcium-vitamin D (OSCAL WITH D) 500-200 MG-UNIT tablet Take 1 tablet by mouth daily. 30 tablet 0   hydrochlorothiazide (HYDRODIURIL) 12.5 MG tablet      hydrochlorothiazide (MICROZIDE) 12.5 MG capsule Take 1 capsule by mouth once daily 90 capsule 1   Insulin Pen Needle (BD PEN NEEDLE NANO 2ND GEN) 32G X 4 MM MISC use as directed 100 each 2   losartan (COZAAR) 50 MG tablet TAKE 1 TABLET BY MOUTH ONCE DAILY 90 tablet 1   pantoprazole (PROTONIX) 40 MG tablet TAKE 1 TABLET BY MOUTH ONCE DAILY 90 tablet 1   QC Alcohol Swabs 70 % PADS Apply 1 each topically once daily 100 each 2   rivaroxaban (XARELTO) 10 MG TABS tablet Take 1 tablet (10 mg total) by mouth daily. 30 tablet 11   Vitamin D, Ergocalciferol, (DRISDOL) 1.25 MG (50000 UNIT) CAPS capsule Take 1 capsule by mouth once weekly 13 capsule 4   hydrochlorothiazide (MICROZIDE) 12.5 MG capsule Take 1 capsule (12.5 mg total) by mouth once daily 90 capsule 0    levothyroxine (SYNTHROID) 100 MCG tablet Take 1 tablet by mouth once daily on an empty stomach with a glass of water at least 30-60 minutes before breakfast. 90 tablet 1   rivaroxaban (XARELTO) 10 MG TABS tablet Take 1 tablet by mouth daily with supper. 30 tablet 6   trimethoprim-polymyxin b (POLYTRIM) ophthalmic solution Place 1 drop into the left eye every 3 (three) hours while awake. 10 mL 0   No current facility-administered medications for this visit.    Past Medical History:  Diagnosis Date   Back pain    BPH (benign prostatic hyperplasia)    DVT (deep venous thrombosis) (HCC)    Extensor tendon laceration, finger, open wound, initial encounter    left thumb   Hypertension    Hypothyroidism    Macrocytosis    Numbness and tingling    Pars defect with spondylolisthesis     Past Surgical History:  Procedure Laterality Date   ABDOMINAL EXPOSURE N/A 09/19/2020   Procedure: ABDOMINAL EXPOSURE;  Surgeon: Rosetta Posner, MD;  [facility];  Service: Vascular;  Laterality: N/A;  anterior   ANTERIOR LUMBAR FUSION N/A 09/19/2020   Procedure: Anterior Lumbar Interbody Fusion Lumbar Five-Sacral One, posterior lateral fusion with gill decompression Lumbar Five-Sacral One.;  Surgeon: Eustace Moore, MD;  Location: [place];  Service: Neurosurgery;  Laterality: N/A;  anterior  APPLICATION OF INTRAOPERATIVE CT SCAN N/A 09/27/2020   Procedure: APPLICATION OF INTRAOPERATIVE CT SCAN;  Surgeon: Eustace Moore, MD;  Location: [place];  Service: Neurosurgery;  Laterality: N/A;   COLONOSCOPY WITH PROPOFOL N/A 12/18/2018   Procedure: COLONOSCOPY WITH BIOPSY;  Surgeon: Lucilla Lame, MD;  Location: [place];  Service: Endoscopy;  Laterality: N/A;  NEEDS TO STAY FIRST   ESOPHAGOGASTRODUODENOSCOPY     ESOPHAGOGASTRODUODENOSCOPY (EGD) WITH PROPOFOL N/A 02/23/2019   Procedure: ESOPHAGOGASTRODUODENOSCOPY (EGD) WITH PROPOFOL;  Surgeon: Lucilla Lame, MD;  Location: [facility];  Service: Endoscopy;   Laterality: N/A;   HERNIA REPAIR     LAMINECTOMY WITH POSTERIOR LATERAL ARTHRODESIS LEVEL 1 N/A 09/19/2020   Procedure: LAMINECTOMY WITH POSTERIOR LATERAL ARTHRODESIS LEVEL 1;  Surgeon: Eustace Moore, MD;  Location: [place];  Service: Neurosurgery;  Laterality: N/A;  posterior   LAMINECTOMY WITH POSTERIOR LATERAL ARTHRODESIS LEVEL 2 N/A 09/27/2020   Procedure: Lumbar exploration with Extension of Posterior Instrumentation Lumbar Four to the [place] with Airo;  Surgeon: Eustace Moore, MD;  Location: [place];  Service: Neurosurgery;  Laterality: N/A;   LIPOMA EXCISION Right    upper arm   PERIPHERAL VASCULAR THROMBECTOMY Left 04/05/2021   Procedure: PERIPHERAL VASCULAR THROMBECTOMY;  Surgeon: Algernon Huxley, MD;  Location: [place] [facility];  Service: Cardiovascular;  Laterality: Left;   POLYPECTOMY N/A 12/18/2018   Procedure: POLYPECTOMY;  Surgeon: Lucilla Lame, MD;  Location: [place];  Service: Endoscopy;  Laterality: N/A;   TENDON REPAIR Left 11/05/2019   Procedure: LEFT THUMB TENDON REPAIR;  Surgeon: Cindra Presume, MD;  Location: [place];  Service: Plastics;  Laterality: Left;  Axillary block, MAC   THROMBECTOMY     ULNAR NERVE REPAIR     UMBILICAL HERNIA REPAIR       Social History   Tobacco Use   Smoking status: Never   Smokeless tobacco: Never  Vaping Use   Vaping Use: Never used  Substance Use Topics   Alcohol use: Not Currently   Drug use: Never      No family history on file.   Allergies  Allergen Reactions   Alendronate Sodium     REVIEW OF SYSTEMS (Negative unless checked)   Constitutional: _0 Weight loss  _1 Fever  _2 Chills Cardiac: _3 Chest pain   _4 Chest pressure   _5 Palpitations   _6 Shortness of breath when laying flat   _7 Shortness of breath at rest   _8 Shortness of breath with exertion. Vascular:  _9 Pain in legs with walking   _10 Pain in legs at rest   _11 Pain in legs when laying flat   _12 Claudication   _13 Pain in feet when walking   _14 Pain in feet at rest  _15 Pain in feet when laying flat   _16 History of DVT   _17 Phlebitis   _18 Swelling in legs   _19 Varicose veins   _20 Non-healing ulcers Pulmonary:   _21 Uses home oxygen   _22 Productive cough   _23 Hemoptysis   _24 Wheeze  _25 COPD   _26 Asthma Neurologic:  _27 Dizziness  _28 Blackouts   _29 Seizures   _30 History of stroke   _31 History of TIA  _32 Aphasia   _33 Temporary blindness   _34 Dysphagia   _35 Weakness or numbness in arms   _36 Weakness or numbness in legs Musculoskeletal:  _37 Arthritis   _38 Joint swelling   _39 Joint pain   _40 Low back pain Hematologic:  _41 Easy bruising  _42 Easy bleeding   _43 Hypercoagulable state   _44 Anemic  _45 Hepatitis Gastrointestinal:  _46 Blood in stool   _47 Vomiting blood  _48 Gastroesophageal reflux/heartburn   _49   Abdominal pain Genitourinary:  _0 Chronic kidney disease   _1 Difficult urination  _2 Frequent urination  _3 Burning with urination   _4 Hematuria Skin:  _5 Rashes   _6 Ulcers   _7 Wounds Psychological:  _8 History of anxiety   _9  History of major depression.   Physical Examination  BP 136/83 (BP Location: Right Arm)   Pulse 64   Resp 16   Wt 198 lb 9.6 oz (90.1 kg)   BMI 29.33 kg/m  Gen:  WD/WN, NAD Head: [place]/AT, No temporalis wasting. Ear/Nose/Throat: Hearing grossly intact, nares w/o erythema or drainage Eyes: Conjunctiva clear. Sclera non-icteric Neck: Supple.  Trachea midline Pulmonary:  Good air movement, no use of accessory muscles.  Cardiac: RRR, no JVD Vascular:  Vessel Right Left  Radial Palpable Palpable                   Musculoskeletal: M/S 5/5 throughout.  No deformity or atrophy.  1+ left lower extremity edema. Neurologic: Sensation grossly intact in extremities with the exception of the ulnar distribution on the left hand.  Symmetrical.  Speech is fluent.  Psychiatric: Judgment intact, Mood & affect appropriate for pt's clinical situation. Dermatologic: No rashes or ulcers noted.  No cellulitis or open wounds.      Labs No results found for  this or any previous visit (from the past 2160 hour(s)).  Radiology No results found.  Assessment/Plan  DVT (deep venous thrombosis) (HCC) We had a discussion today about long-term options for his anticoagulation with extensive DVT.  He will complete a 71-monthcourse of full dose Xarelto next month.  We are then going to prescribe him 10 mg daily of Xarelto to avoid recurrent DVT given his extensive previous DVT.  This has been shown to be very helpful and beneficial for reducing DVT with a very low risk of bleeding.  I will plan on this indefinitely.  He will contact me or our office with any problems in the interim.  Essential hypertension blood pressure control important in reducing the progression of atherosclerotic disease. On appropriate oral medications.   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  He is diligently wearing his compression stockings and tolerating anticoagulation.  This has really limited him to the point that he cannot stand for more than an hour at a time.  The compression socks and got the swelling under reasonable control.  The pain is persistent.    JLeotis Pain MD  10/05/2021 8:52 AM    This note was created with Dragon medical transcription system.  Any errors from dictation are purely unintentional

## 2021-08-28 NOTE — Assessment & Plan Note (Signed)
We had a discussion today about long-term options for his anticoagulation with extensive DVT.  He will complete a 24-month course of full dose Xarelto next month.  We are then going to prescribe him 10 mg daily of Xarelto to avoid recurrent DVT given his extensive previous DVT.  This has been shown to be very helpful and beneficial for reducing DVT with a very low risk of bleeding.  I will plan on this indefinitely.  He will contact me or our office with any problems in the interim.

## 2021-09-11 ENCOUNTER — Other Ambulatory Visit: Payer: Self-pay

## 2021-09-11 ENCOUNTER — Other Ambulatory Visit (HOSPITAL_COMMUNITY): Payer: Self-pay

## 2021-09-16 ENCOUNTER — Ambulatory Visit
Admission: RE | Admit: 2021-09-16 | Discharge: 2021-09-16 | Disposition: A | Discharge status: Home / Self Care | Payer: 59 | Source: Ambulatory Visit | Attending: Physician Assistant | Admitting: Physician Assistant

## 2021-09-16 ENCOUNTER — Emergency Department (HOSPITAL_BASED_OUTPATIENT_CLINIC_OR_DEPARTMENT_OTHER)
Admission: EM | Admit: 2021-09-16 | Discharge: 2021-09-16 | Disposition: A | Discharge status: Home / Self Care | Payer: 59 | Attending: Emergency Medicine | Admitting: Emergency Medicine

## 2021-09-16 ENCOUNTER — Encounter (HOSPITAL_BASED_OUTPATIENT_CLINIC_OR_DEPARTMENT_OTHER): Payer: Self-pay

## 2021-09-16 ENCOUNTER — Other Ambulatory Visit: Payer: Self-pay

## 2021-09-16 VITALS — BP 139/87 | HR 66 | Temp 98.6°F | Resp 16

## 2021-09-16 DIAGNOSIS — I10 Essential (primary) hypertension: Secondary | ICD-10-CM | POA: Insufficient documentation

## 2021-09-16 DIAGNOSIS — H5712 Ocular pain, left eye: Secondary | ICD-10-CM | POA: Diagnosis not present

## 2021-09-16 DIAGNOSIS — Z79899 Other long term (current) drug therapy: Secondary | ICD-10-CM | POA: Insufficient documentation

## 2021-09-16 DIAGNOSIS — H1032 Unspecified acute conjunctivitis, left eye: Secondary | ICD-10-CM | POA: Diagnosis not present

## 2021-09-16 DIAGNOSIS — Z7901 Long term (current) use of anticoagulants: Secondary | ICD-10-CM | POA: Insufficient documentation

## 2021-09-16 DIAGNOSIS — E039 Hypothyroidism, unspecified: Secondary | ICD-10-CM | POA: Diagnosis not present

## 2021-09-16 DIAGNOSIS — H109 Unspecified conjunctivitis: Secondary | ICD-10-CM

## 2021-09-16 DIAGNOSIS — H11432 Conjunctival hyperemia, left eye: Secondary | ICD-10-CM

## 2021-09-16 HISTORY — DX: Acute embolism and thrombosis of unspecified deep veins of unspecified lower extremity: I82.409

## 2021-09-16 MED ORDER — POLYMYXIN B-TRIMETHOPRIM 10000-0.1 UNIT/ML-% OP SOLN: 1.0000 [drp] | OPHTHALMIC | 0 refills | Status: DC

## 2021-09-16 MED ORDER — TETRACAINE HCL 0.5 % OP SOLN
1.0000 [drp] | Freq: Once | OPHTHALMIC | Status: AC
  Administered 2021-09-16: 14:00:00 1 [drp] via OPHTHALMIC

## 2021-09-16 NOTE — Discharge Instructions (Signed)
  Please report to [facility]  [place] [id number]  Ask to speak with charge nurse, Legrand Como- he is aware of impending arrival.

## 2021-09-16 NOTE — ED Provider Notes (Signed)
[name] Provider Note   CSN: [id number] Arrival date & time: 09/16/21  1309     History Chief Complaint  Patient presents with   Eye Pain    Alexander Rojas is a 65 y.o. male.  Presents to ER with concern for left eye pain, redness.  Symptoms ongoing for the past few days.  Has noticed increasing redness, feels more watery and has had increasing pain.  Pain is isolated to the eye itself, feels like it is at the surface of his eye.  Denies any change in vision.  No headaches or vomiting.  No pain with eye movement.  He does not recall any foreign bodies or injuries.  No recent activities that he believes would put him at risk for foreign body.  Has history of DVT.  Wears prescription glasses but does not wear contacts.  Pt is a Nature conservation officer.   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/2020   Abnormality of gait 12/11/2020   Hypocalcemia 12/08/2020   DVT (deep venous thrombosis) ([place]) 12/08/2020   Post-operative pain    History of hypertension    Acute lower UTI    Leukocytosis    Dysuria    Drug induced constipation    Neuropathic pain    Lumbar radiculopathy 10/10/2020   Hardware failure of anterior column of spine ([place]) 09/26/2020   S/P lumbar fusion 09/19/2020   Benign prostatic hyperplasia with urinary hesitancy 05/09/2017   Tongue burning sensation 04/18/2016   Essential hypertension 10/06/2014   Hypothyroidism due to acquired atrophy of thyroid 10/06/2014    Past Surgical History:  Procedure  Laterality Date   ABDOMINAL EXPOSURE N/A 09/19/2020   Procedure: ABDOMINAL EXPOSURE;  Surgeon: Rosetta Posner, MD;  [facility];  Service: Vascular;  Laterality: N/A;  anterior   ANTERIOR LUMBAR FUSION N/A 09/19/2020   Procedure: Anterior Lumbar Interbody Fusion Lumbar Five-Sacral One, posterior lateral fusion with gill decompression Lumbar Five-Sacral One.;  Surgeon: Eustace Moore, MD;  Location: [place];  Service: Neurosurgery;  Laterality: N/A;  anterior   APPLICATION OF INTRAOPERATIVE CT SCAN N/A 09/27/2020   Procedure: APPLICATION OF INTRAOPERATIVE CT SCAN;  Surgeon: Eustace Moore, MD;  Location: [place];  Service: Neurosurgery;  Laterality: N/A;   COLONOSCOPY WITH PROPOFOL N/A 12/18/2018   Procedure: COLONOSCOPY WITH BIOPSY;  Surgeon: Lucilla Lame, MD;  Location: [place];  Service: Endoscopy;  Laterality: N/A;  NEEDS TO STAY FIRST   ESOPHAGOGASTRODUODENOSCOPY     ESOPHAGOGASTRODUODENOSCOPY (EGD) WITH PROPOFOL N/A 02/23/2019   Procedure: ESOPHAGOGASTRODUODENOSCOPY (EGD) WITH PROPOFOL;  Surgeon: Lucilla Lame, MD;  Location: [facility];  Service: Endoscopy;  Laterality: N/A;   HERNIA REPAIR     LAMINECTOMY WITH POSTERIOR LATERAL ARTHRODESIS LEVEL 1 N/A 09/19/2020   Procedure: LAMINECTOMY WITH POSTERIOR LATERAL ARTHRODESIS LEVEL 1;  Surgeon: Eustace Moore, MD;  Location: [place];  Service: Neurosurgery;  Laterality: N/A;  posterior   LAMINECTOMY WITH POSTERIOR  LATERAL ARTHRODESIS LEVEL 2 N/A 09/27/2020   Procedure: Lumbar exploration with Extension of Posterior Instrumentation Lumbar Four to the Ilium with Airo;  Surgeon: Eustace Moore, MD;  Location: [place];  Service: Neurosurgery;  Laterality: N/A;   LIPOMA EXCISION Right    upper arm   PERIPHERAL VASCULAR THROMBECTOMY Left 04/05/2021   Procedure: PERIPHERAL VASCULAR THROMBECTOMY;  Surgeon: Algernon Huxley, MD;  Location: [place] [facility];  Service: Cardiovascular;  Laterality: Left;   POLYPECTOMY N/A 12/18/2018    Procedure: POLYPECTOMY;  Surgeon: Lucilla Lame, MD;  Location: [place];  Service: Endoscopy;  Laterality: N/A;   TENDON REPAIR Left 11/05/2019   Procedure: LEFT THUMB TENDON REPAIR;  Surgeon: Cindra Presume, MD;  Location: [place];  Service: Plastics;  Laterality: Left;  Axillary block, MAC   THROMBECTOMY     ULNAR NERVE REPAIR     UMBILICAL HERNIA REPAIR         No family history on file.  Social History   Tobacco Use   Smoking status: Never   Smokeless tobacco: Never  Vaping Use   Vaping Use: Never used  Substance Use Topics   Alcohol use: Not Currently   Drug use: Never    Home Medications Prior to Admission medications   Medication Sig Start Date End Date Taking? Authorizing Provider  trimethoprim-polymyxin b (POLYTRIM) ophthalmic solution Place 1 drop into the left eye every 3 (three) hours while awake. 09/16/21  Yes Lucrezia Starch, MD  Abaloparatide (TYMLOS) 3120 MCG/1.56ML SOPN Inject 80 mcg subcutaneously once daily 02/21/21   Tresa Garter, MD  calcium-vitamin D (OSCAL WITH D) 500-200 MG-UNIT tablet Take 1 tablet by mouth daily. 10/26/20   Angiulli, Lavon Paganini, PA-C  hydrochlorothiazide (HYDRODIURIL) 12.5 MG tablet     [provider]  hydrochlorothiazide (MICROZIDE) 12.5 MG capsule Take 1 capsule (12.5 mg total) by mouth once daily 04/11/21     Insulin Pen Needle (BD PEN NEEDLE NANO 2ND GEN) 32G X 4 MM MISC use as directed 03/01/21     levothyroxine (SYNTHROID) 100 MCG tablet Take 1 tablet (100 mcg total) by mouth once daily on an empty stomach with a glass of water at least 30-60 minutes before breakfast. 04/11/21     losartan (COZAAR) 50 MG tablet TAKE 1 TABLET BY MOUTH ONCE DAILY 06/22/21     pantoprazole (PROTONIX) 40 MG tablet TAKE 1 TABLET BY MOUTH ONCE DAILY 06/19/21     QC Alcohol Swabs 70 % PADS Apply 1 each topically once daily 03/01/21     rivaroxaban (XARELTO) 10 MG TABS tablet Take 1 tablet (10 mg total) by mouth daily.  08/28/21   Algernon Huxley, MD  rivaroxaban (XARELTO) 20 MG TABS tablet Take 1 tablet (20 mg total) by mouth daily with supper. 04/20/21   Algernon Huxley, MD  Vitamin D, Ergocalciferol, (DRISDOL) 1.25 MG (50000 UNIT) CAPS capsule Take 1 capsule by mouth once weekly 06/13/21       Allergies    Alendronate sodium  Review of Systems   Review of Systems  Eyes:  Positive for pain and redness. Negative for visual disturbance.  All other systems reviewed and are negative.  Physical Exam Updated Vital Signs BP (!) 147/99   Pulse 64   Temp 98.1 F (36.7 C) (Oral)   Resp 16   Ht _0  (1.778 m)   Wt 88.5 kg   SpO2 96%   BMI 27.98 kg/m   Physical Exam Vitals  and nursing note reviewed.  Constitutional:      General: He is not in acute distress.    Appearance: He is well-developed.  HENT:     Head: Normocephalic and atraumatic.  Eyes:     General: Lids are normal. Lids are everted, no foreign bodies appreciated. Vision grossly intact. Gaze aligned appropriately.        Right eye: No foreign body or discharge.        Left eye: No foreign body.     Intraocular pressure: Right eye pressure is 18 mmHg. Left eye pressure is 17 mmHg. Measurements were taken using a handheld tonometer.    Extraocular Movements: Extraocular movements intact.     Conjunctiva/sclera:     Right eye: Right conjunctiva is not injected. No exudate or hemorrhage.    Left eye: Left conjunctiva is injected.     Comments: Right eye appears normal, left eye has conjunctival injection, quite watery, no purulent discharge, no foreign body on lid eversion or careful inspection of eye, no abrasion identified on fluorescein stain  Cardiovascular:     Comments: Warm and well perfused Pulmonary:     Effort: Pulmonary effort is normal. No respiratory distress.  Musculoskeletal:        General: No swelling.     Cervical back: Neck supple.  Skin:    General: Skin is warm and dry.     Capillary Refill: Capillary refill takes less  than 2 seconds.  Neurological:     General: No focal deficit present.     Mental Status: He is alert.  Psychiatric:        Mood and Affect: Mood normal.    ED Results / Procedures / Treatments   Labs (all labs ordered are listed, but only abnormal results are displayed) Labs Reviewed - No data to display  EKG None  Radiology No results found.  Procedures Procedures   Medications Ordered in ED Medications  tetracaine (PONTOCAINE) 0.5 % ophthalmic solution 1 drop (1 drop Both Eyes Given by Other 09/16/21 1340)    ED Course  I have reviewed the triage vital signs and the nursing notes.  Pertinent labs & imaging results that were available during my care of the patient were reviewed by me and considered in my medical decision making (see chart for details).    MDM Rules/Calculators/A&P                           65 year old gentleman presents to ER with concern for eye pain and redness.  Isolated to left eye.  On exam, patient has obvious conjunctival injection.  No foreign bodies appreciated, normal fluorscein stain exam, normal pressures on tonometry, visual acuity intact. No surrounding erythema. Based on exam, suspect most likely conjunctivitis. Will start on course of polytrim. Discussed with [place] on call for Optho. She recommended having patient follow up in her clinic tomorrow morning.    After the discussed management above, the patient was determined to be safe for discharge.  The patient was in agreement with this plan and all questions regarding their care were answered.  ED return precautions were discussed and the patient will return to the ED with any significant worsening of condition.  Final Clinical Impression(s) / ED Diagnoses Final diagnoses:  Left eye pain  Conjunctivitis of left eye, unspecified conjunctivitis type    Rx / DC Orders ED Discharge Orders          Ordered  trimethoprim-polymyxin b (POLYTRIM) ophthalmic solution  Every  3 hours  while awake        09/16/21 1423             Lucrezia Starch, MD 09/16/21 2003

## 2021-09-16 NOTE — ED Notes (Signed)
ED Provider at bedside. 

## 2021-09-16 NOTE — ED Triage Notes (Signed)
Pt arrives ambulatory to ED with c/o pain to left eye with redness was seen at [facility] today and sent to ED. Some watery drainage to left eye, no vision changes, no headache.

## 2021-09-16 NOTE — ED Triage Notes (Signed)
Pt reports left eye pain and pruritis with watery drainage x 2 days.  Denies any vision changes.

## 2021-09-16 NOTE — Discharge Instructions (Addendum)
Please go to the ophthalmology office at 8:30 AM tomorrow morning.  Dr. Ellie Lunch is expecting to see you.  Take the antibiotic drops for now.  If you develop loss of vision, significant increase in pain, headache or vomiting, come back to ER for reassessment.

## 2021-09-16 NOTE — ED Provider Notes (Signed)
[facility]    CSN: [id number] Arrival date & time: 09/16/21  1126      History   Chief Complaint Chief Complaint  Patient presents with   Appointment    1200   Eye Problem    HPI DAMARE SERANO is a 65 y.o. male.   Patient here today for evaluation of left eye irritation that started a few days ago.  He reports that has been red and watery.  He has had some itching to his eye but denies any significant pain.  He has not had any headaches.  He denies any vision changes.  He denies any nausea or vomiting.  Denies any known injury or concern for foreign body.  The history is provided by the patient.  Eye Problem Associated symptoms: discharge and redness   Associated symptoms: no numbness    Past Medical History:  Diagnosis Date   Back pain    BPH (benign prostatic hyperplasia)    DVT (deep venous thrombosis) (HCC)    Extensor tendon laceration, finger, open wound, initial encounter    left thumb   Hypertension    Hypothyroidism    Macrocytosis    Numbness and tingling    Pars defect with spondylolisthesis     Patient Active Problem List   Diagnosis Date Noted   Pain and swelling of lower leg 04/02/2021   Ulnar nerve entrapment syndrome, left 12/11/2020   Abnormality of gait 12/11/2020   Hypocalcemia 12/08/2020   DVT (deep venous thrombosis) ([place]) 12/08/2020   Post-operative pain    History of hypertension    Acute lower UTI    Leukocytosis    Dysuria    Drug induced constipation    Neuropathic pain    Lumbar radiculopathy 10/10/2020   Hardware failure of anterior column of spine ([place]) 09/26/2020   S/P lumbar fusion 09/19/2020   Benign prostatic hyperplasia with urinary hesitancy 05/09/2017   Tongue burning sensation 04/18/2016   Essential hypertension 10/06/2014   Hypothyroidism due to acquired atrophy of thyroid 10/06/2014    Past Surgical History:  Procedure Laterality Date   ABDOMINAL EXPOSURE N/A 09/19/2020   Procedure: ABDOMINAL  EXPOSURE;  Surgeon: Rosetta Posner, MD;  Location: [facility];  Service: Vascular;  Laterality: N/A;  anterior   ANTERIOR LUMBAR FUSION N/A 09/19/2020   Procedure: Anterior Lumbar Interbody Fusion Lumbar Five-Sacral One, posterior lateral fusion with gill decompression Lumbar Five-Sacral One.;  Surgeon: Eustace Moore, MD;  Location: [place];  Service: Neurosurgery;  Laterality: N/A;  anterior   APPLICATION OF INTRAOPERATIVE CT SCAN N/A 09/27/2020   Procedure: APPLICATION OF INTRAOPERATIVE CT SCAN;  Surgeon: Eustace Moore, MD;  Location: [place];  Service: Neurosurgery;  Laterality: N/A;   COLONOSCOPY WITH PROPOFOL N/A 12/18/2018   Procedure: COLONOSCOPY WITH BIOPSY;  Surgeon: Lucilla Lame, MD;  Location: [place];  Service: Endoscopy;  Laterality: N/A;  NEEDS TO STAY FIRST   ESOPHAGOGASTRODUODENOSCOPY     ESOPHAGOGASTRODUODENOSCOPY (EGD) WITH PROPOFOL N/A 02/23/2019   Procedure: ESOPHAGOGASTRODUODENOSCOPY (EGD) WITH PROPOFOL;  Surgeon: Lucilla Lame, MD;  Location: [facility];  Service: Endoscopy;  Laterality: N/A;   HERNIA REPAIR     LAMINECTOMY WITH POSTERIOR LATERAL ARTHRODESIS LEVEL 1 N/A 09/19/2020   Procedure: LAMINECTOMY WITH POSTERIOR LATERAL ARTHRODESIS LEVEL 1;  Surgeon: Eustace Moore, MD;  Location: [place];  Service: Neurosurgery;  Laterality: N/A;  posterior   LAMINECTOMY WITH POSTERIOR LATERAL ARTHRODESIS LEVEL 2 N/A 09/27/2020   Procedure: Lumbar exploration with Extension of  Posterior Instrumentation Lumbar Four to the Ilium with Airo;  Surgeon: Eustace Moore, MD;  Location: [place];  Service: Neurosurgery;  Laterality: N/A;   LIPOMA EXCISION Right    upper arm   PERIPHERAL VASCULAR THROMBECTOMY Left 04/05/2021   Procedure: PERIPHERAL VASCULAR THROMBECTOMY;  Surgeon: Algernon Huxley, MD;  Location: [place] [facility];  Service: Cardiovascular;  Laterality: Left;   POLYPECTOMY N/A 12/18/2018   Procedure: POLYPECTOMY;  Surgeon: Lucilla Lame, MD;  Location: [place];   Service: Endoscopy;  Laterality: N/A;   TENDON REPAIR Left 11/05/2019   Procedure: LEFT THUMB TENDON REPAIR;  Surgeon: Cindra Presume, MD;  Location: [place];  Service: Plastics;  Laterality: Left;  Axillary block, MAC   THROMBECTOMY     ULNAR NERVE REPAIR     UMBILICAL HERNIA REPAIR         Home Medications    Prior to Admission medications   Medication Sig Start Date End Date Taking? Authorizing Provider  Abaloparatide (TYMLOS) 3120 MCG/1.56ML SOPN Inject 80 mcg subcutaneously once daily 02/21/21  Yes Jegede, Olugbemiga E, MD  calcium-vitamin D (OSCAL WITH D) 500-200 MG-UNIT tablet Take 1 tablet by mouth daily. 10/26/20  Yes Angiulli, Lavon Paganini, PA-C  hydrochlorothiazide (MICROZIDE) 12.5 MG capsule Take 1 capsule (12.5 mg total) by mouth once daily 04/11/21  Yes   levothyroxine (SYNTHROID) 100 MCG tablet Take 1 tablet (100 mcg total) by mouth once daily on an empty stomach with a glass of water at least 30-60 minutes before breakfast. 04/11/21  Yes   losartan (COZAAR) 50 MG tablet TAKE 1 TABLET BY MOUTH ONCE DAILY 06/22/21  Yes   pantoprazole (PROTONIX) 40 MG tablet TAKE 1 TABLET BY MOUTH ONCE DAILY 06/19/21  Yes   rivaroxaban (XARELTO) 20 MG TABS tablet Take 1 tablet (20 mg total) by mouth daily with supper. 04/20/21  Yes Dew, Erskine Squibb, MD  Vitamin D, Ergocalciferol, (DRISDOL) 1.25 MG (50000 UNIT) CAPS capsule Take 1 capsule by mouth once weekly 06/13/21  Yes   hydrochlorothiazide (HYDRODIURIL) 12.5 MG tablet     [provider]  Insulin Pen Needle (BD PEN NEEDLE NANO 2ND GEN) 32G X 4 MM MISC use as directed 03/01/21     QC Alcohol Swabs 70 % PADS Apply 1 each topically once daily 03/01/21     rivaroxaban (XARELTO) 10 MG TABS tablet Take 1 tablet (10 mg total) by mouth daily. 08/28/21   Algernon Huxley, MD    Family History History reviewed. No pertinent family history.  Social History Social History   Tobacco Use   Smoking status: Never   Smokeless tobacco: Never   Vaping Use   Vaping Use: Never used  Substance Use Topics   Alcohol use: Not Currently   Drug use: Never     Allergies   Alendronate sodium   Review of Systems Review of Systems  Constitutional:  Negative for chills and fever.  Eyes:  Positive for discharge and redness. Negative for visual disturbance.  Respiratory:  Negative for shortness of breath.   Skin:  Positive for color change and wound.  Neurological:  Negative for numbness.    Physical Exam Triage Vital Signs ED Triage Vitals  Enc Vitals Group     BP 09/16/21 1217 139/87     Pulse Rate 09/16/21 1217 66     Resp 09/16/21 1217 16     Temp 09/16/21 1217 98.6 F (37 C)     Temp Source 09/16/21 1217 Temporal  SpO2 09/16/21 1217 95 %     Weight --      Height --      Head Circumference --      Peak Flow --      Pain Score 09/16/21 1219 4     Pain Loc --      Pain Edu? --      Excl. in [place]? --    No data found.  Updated Vital Signs BP 139/87   Pulse 66   Temp 98.6 F (37 C) (Temporal)   Resp 16   SpO2 95%   Visual Acuity Right Eye Distance: 20/40 -1 with correction Left Eye Distance: 20/20 Bilateral Distance: 20/20 -1 with correction     Physical Exam Vitals and nursing note reviewed.  Constitutional:      General: He is not in acute distress.    Appearance: Normal appearance. He is not ill-appearing.  HENT:     Head: Normocephalic and atraumatic.  Eyes:     General:        Left eye: Discharge (watery) present.    Conjunctiva/sclera:     Right eye: Right conjunctiva is not injected.     Left eye: Left conjunctiva is injected.  Cardiovascular:     Rate and Rhythm: Normal rate.  Pulmonary:     Effort: Pulmonary effort is normal.  Neurological:     Mental Status: He is alert.  Psychiatric:        Mood and Affect: Mood normal.        Behavior: Behavior normal.        Thought Content: Thought content normal.     UC Treatments / Results  Labs (all labs ordered are listed, but only  abnormal results are displayed) Labs Reviewed - No data to display  EKG   Radiology No results found.  Procedures Procedures (including critical care time)  Medications Ordered in UC Medications - No data to display  Initial Impression / Assessment and Plan / UC Course  I have reviewed the triage vital signs and the nursing notes.  Pertinent labs & imaging results that were available during my care of the patient were reviewed by me and considered in my medical decision making (see chart for details).    Suspect likely conjunctivitis however unable to rule out glaucoma.  Will send to emergency department where IOP can be measured.  Final Clinical Impressions(s) / UC Diagnoses   Final diagnoses:  Conjunctival hyperemia of left eye     Discharge Instructions       Please report to [facility]  [place] [id number]  Ask to speak with charge nurse, Legrand Como- he is aware of impending arrival.       ED Prescriptions   None    PDMP not reviewed this encounter.   Francene Finders, PA-C 09/16/21 1339

## 2021-09-17 DIAGNOSIS — H15002 Unspecified scleritis, left eye: Secondary | ICD-10-CM | POA: Diagnosis not present

## 2021-09-17 DIAGNOSIS — H0015 Chalazion left lower eyelid: Secondary | ICD-10-CM | POA: Diagnosis not present

## 2021-09-19 DIAGNOSIS — H15002 Unspecified scleritis, left eye: Secondary | ICD-10-CM | POA: Diagnosis not present

## 2021-09-20 ENCOUNTER — Other Ambulatory Visit (HOSPITAL_COMMUNITY): Payer: Self-pay

## 2021-09-20 ENCOUNTER — Other Ambulatory Visit (INDEPENDENT_AMBULATORY_CARE_PROVIDER_SITE_OTHER): Payer: Self-pay | Admitting: Vascular Surgery

## 2021-09-21 ENCOUNTER — Other Ambulatory Visit (HOSPITAL_COMMUNITY): Payer: Self-pay

## 2021-09-21 MED ORDER — RIVAROXABAN 10 MG PO TABS
10.0000 mg | ORAL_TABLET | Freq: Every day | ORAL | 6 refills | Status: AC
  Filled 2021-09-21: qty 30, 30d supply, fill #0

## 2021-09-21 MED ORDER — LEVOTHYROXINE SODIUM 100 MCG PO TABS
ORAL_TABLET | ORAL | 1 refills | Status: AC
  Filled 2021-09-21: qty 90, 90d supply, fill #0

## 2021-09-26 DIAGNOSIS — H15002 Unspecified scleritis, left eye: Secondary | ICD-10-CM | POA: Diagnosis not present

## 2021-09-27 ENCOUNTER — Other Ambulatory Visit: Payer: Self-pay

## 2021-10-02 ENCOUNTER — Other Ambulatory Visit: Payer: Self-pay

## 2021-10-02 ENCOUNTER — Other Ambulatory Visit (HOSPITAL_COMMUNITY): Payer: Self-pay

## 2021-10-02 MED ORDER — HYDROCHLOROTHIAZIDE 12.5 MG PO CAPS
ORAL_CAPSULE | ORAL | 0 refills | Status: AC
  Filled 2021-10-02: qty 90, 90d supply, fill #0

## 2021-10-03 ENCOUNTER — Other Ambulatory Visit (HOSPITAL_COMMUNITY): Payer: Self-pay

## 2021-10-03 DIAGNOSIS — H903 Sensorineural hearing loss, bilateral: Secondary | ICD-10-CM | POA: Diagnosis not present

## 2021-10-04 ENCOUNTER — Other Ambulatory Visit: Payer: Self-pay

## 2021-10-04 ENCOUNTER — Ambulatory Visit (HOSPITAL_COMMUNITY)
Admission: RE | Admit: 2021-10-04 | Discharge: 2021-10-04 | Disposition: A | Discharge status: Home / Self Care | Payer: 59 | Source: Ambulatory Visit | Attending: Neurological Surgery | Admitting: Neurological Surgery

## 2021-10-04 DIAGNOSIS — M545 Low back pain, unspecified: Secondary | ICD-10-CM | POA: Insufficient documentation

## 2021-10-05 ENCOUNTER — Other Ambulatory Visit (HOSPITAL_COMMUNITY): Payer: Self-pay

## 2021-10-05 DIAGNOSIS — I87009 Postthrombotic syndrome without complications of unspecified extremity: Secondary | ICD-10-CM | POA: Insufficient documentation

## 2021-10-05 NOTE — Assessment & Plan Note (Signed)
The patient has postphlebitic syndrome of the left leg.  He is diligently wearing his compression stockings and tolerating anticoagulation.  This has really limited him to the point that he cannot stand for more than an hour at a time.  The compression socks and got the swelling under reasonable control.  The pain is persistent.

## 2021-10-08 ENCOUNTER — Other Ambulatory Visit (HOSPITAL_COMMUNITY): Payer: Self-pay

## 2021-10-09 ENCOUNTER — Other Ambulatory Visit (HOSPITAL_COMMUNITY): Payer: Self-pay

## 2021-10-12 DIAGNOSIS — H903 Sensorineural hearing loss, bilateral: Secondary | ICD-10-CM | POA: Diagnosis not present

## 2021-10-23 ENCOUNTER — Other Ambulatory Visit (HOSPITAL_COMMUNITY): Payer: Self-pay

## 2021-10-24 ENCOUNTER — Other Ambulatory Visit (HOSPITAL_COMMUNITY): Payer: Self-pay

## 2021-10-24 ENCOUNTER — Other Ambulatory Visit: Payer: Self-pay

## 2022-01-21 DIAGNOSIS — M810 Age-related osteoporosis without current pathological fracture: Secondary | ICD-10-CM | POA: Insufficient documentation

## 2022-01-31 IMAGING — CR DG CHEST 2V
2 series · 2 of 2 positions shown · non-contrast
Comparison: None.

CLINICAL DATA: Back surgery, preoperative evaluation

EXAM:
CHEST - 2 VIEW

[w chest pa]
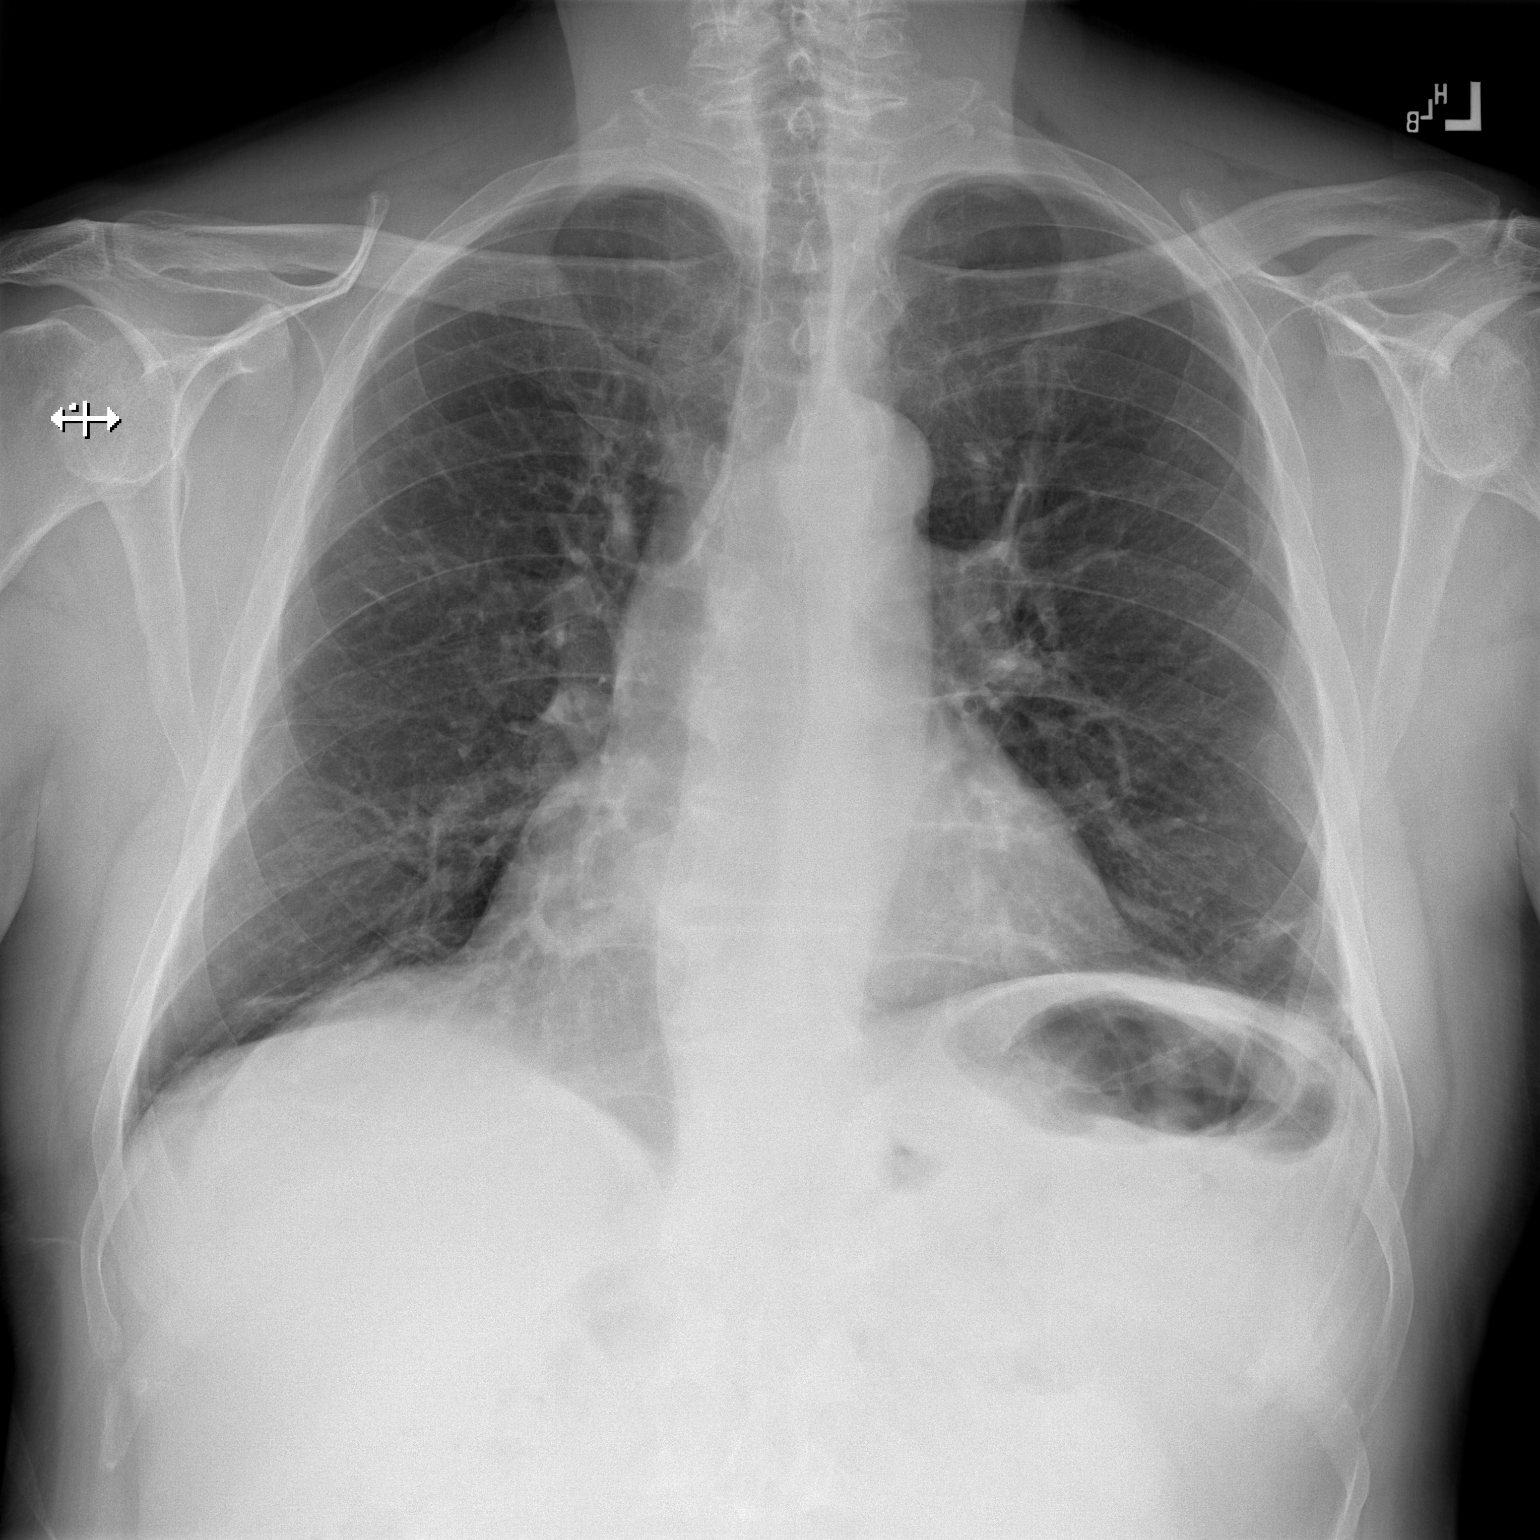

[w chest lat]
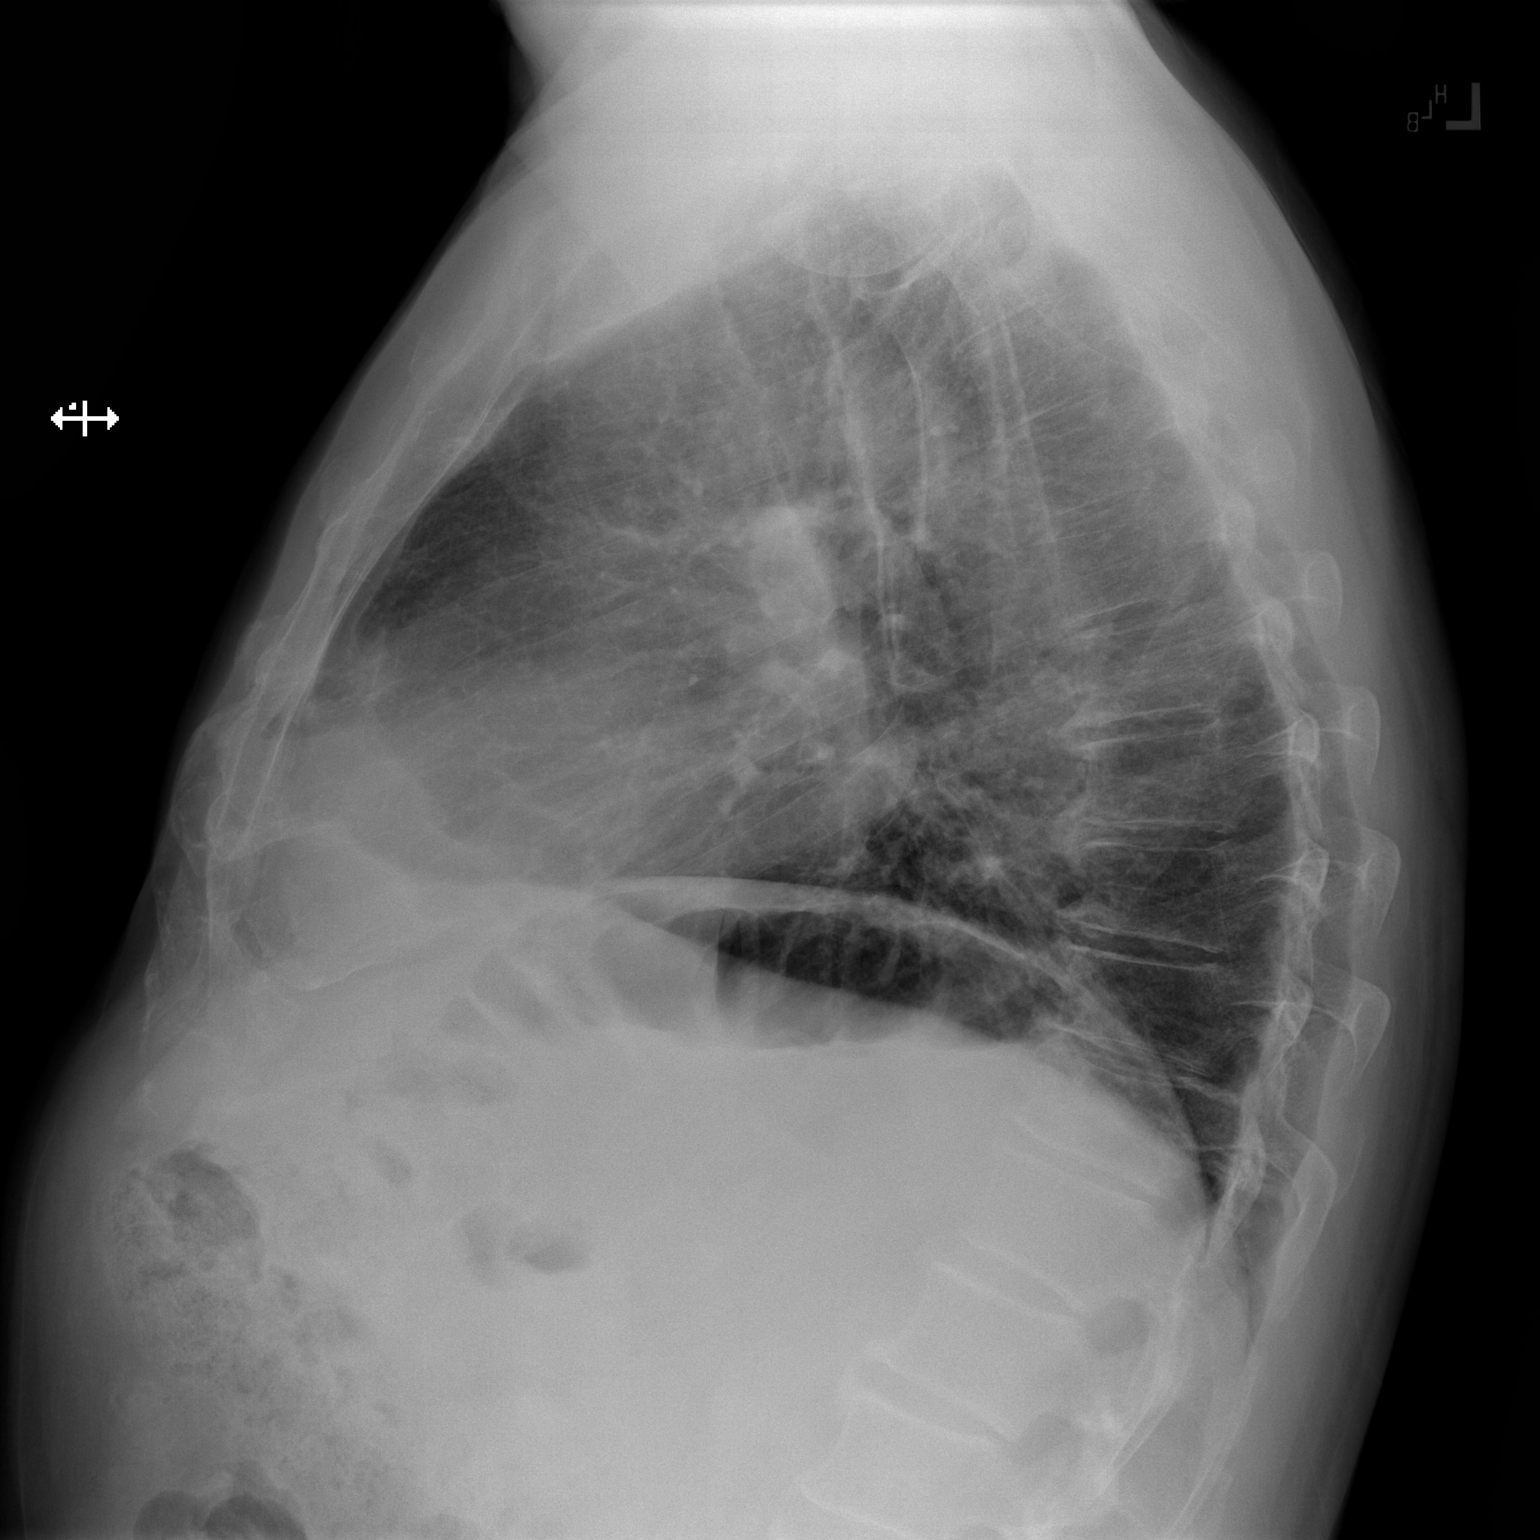

[2 of 2 positions shown; findings below may reference images not displayed]

FINDINGS: Mild subsegmental atelectasis/scarring at the lung bases. No pleural
effusion. Normal heart size. No acute osseous abnormality.
IMPRESSION: Mild subsegmental atelectasis/scarring at the lung bases.

## 2022-02-04 IMAGING — RF DG LUMBAR SPINE 2-3V
1 series · 5 of 5 positions shown · non-contrast
Comparison: CT of the lumbar spine 07/28/2020.

CLINICAL DATA: Surgery, elective. Additional history provided:
Anterior lumbar interbody fusion lumbar 5-sacral 1, posterolateral
fusion with Zeinab decompression lumbar 5-sacral 1. Provided
fluoroscopy time 1 minutes, 57 seconds (94.69 mGy).

EXAM:
LUMBAR SPINE - 2-3 VIEW; DG C-ARM 1-60 MIN

[Series 1: run · 5 of 5 slices shown]
[im 1/5]
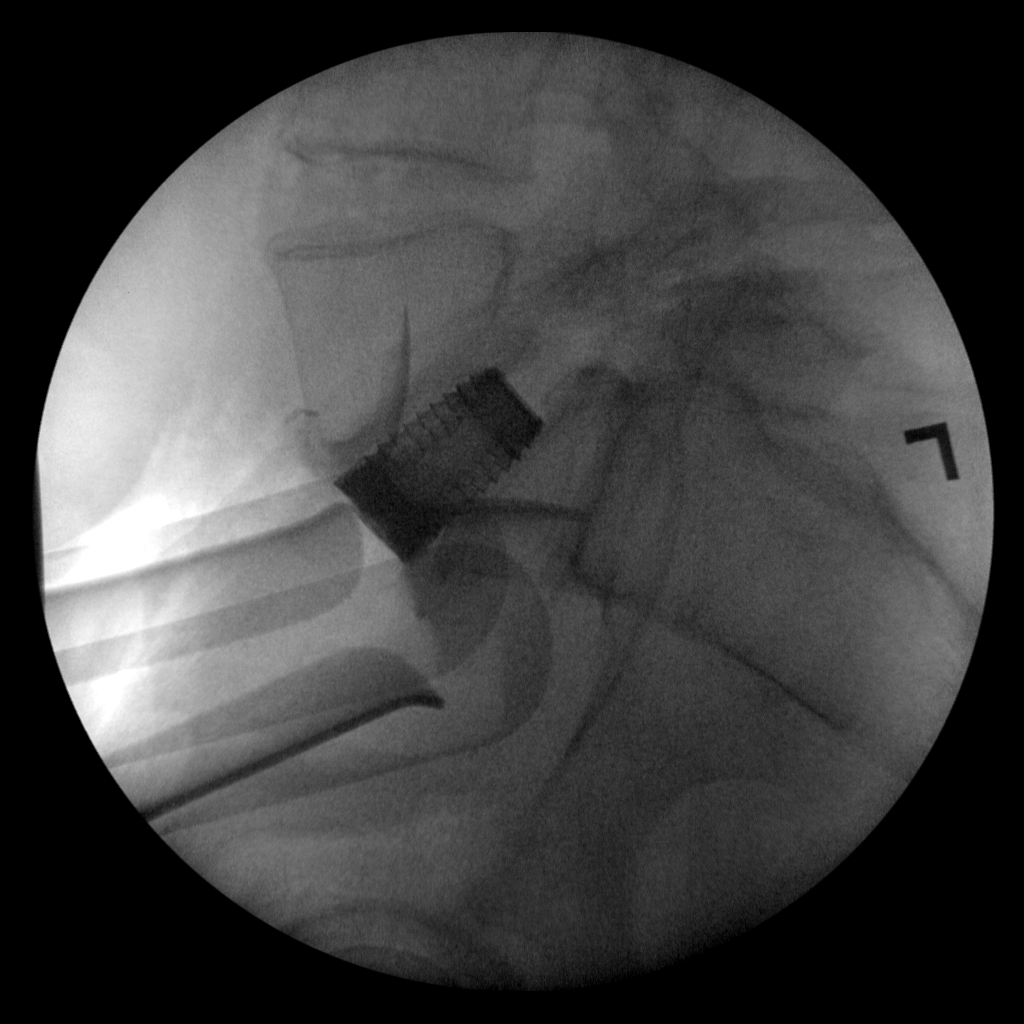
[im 2/5]
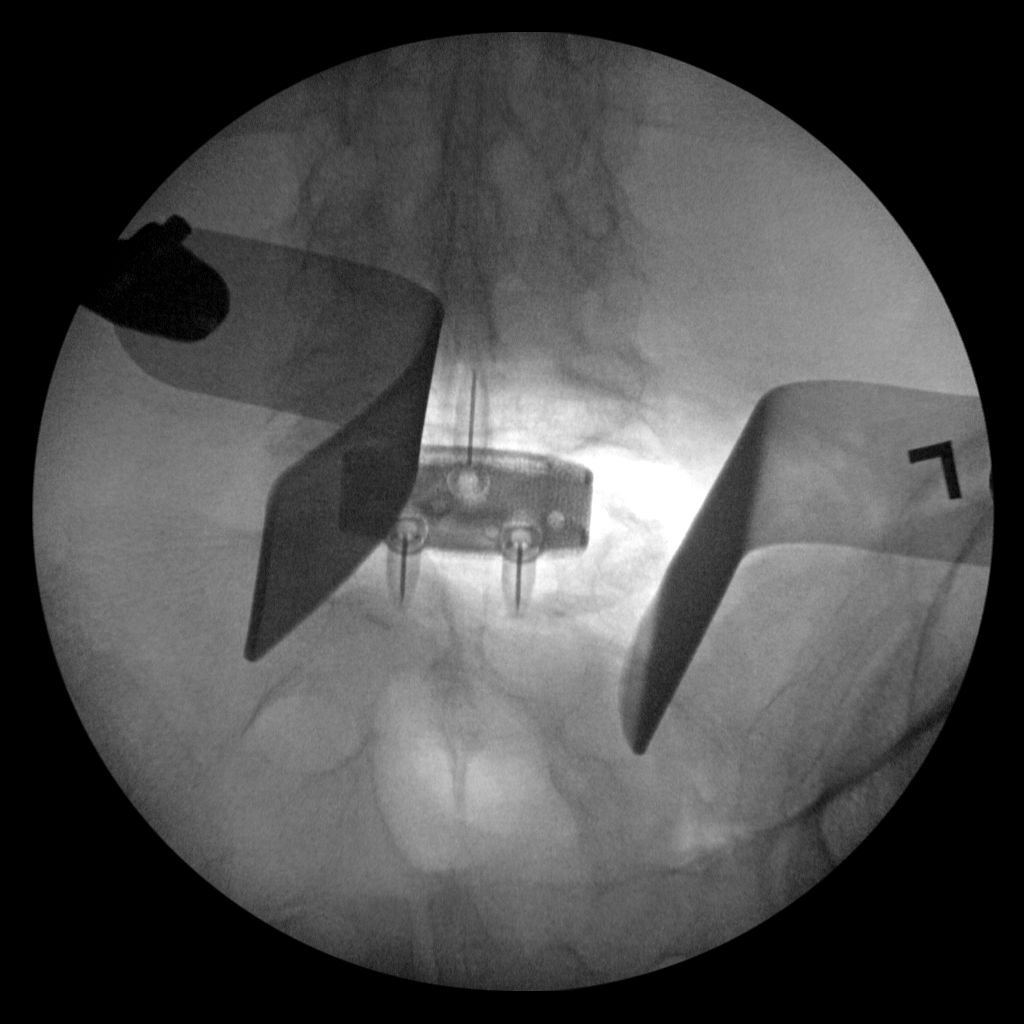
[im 3/5]
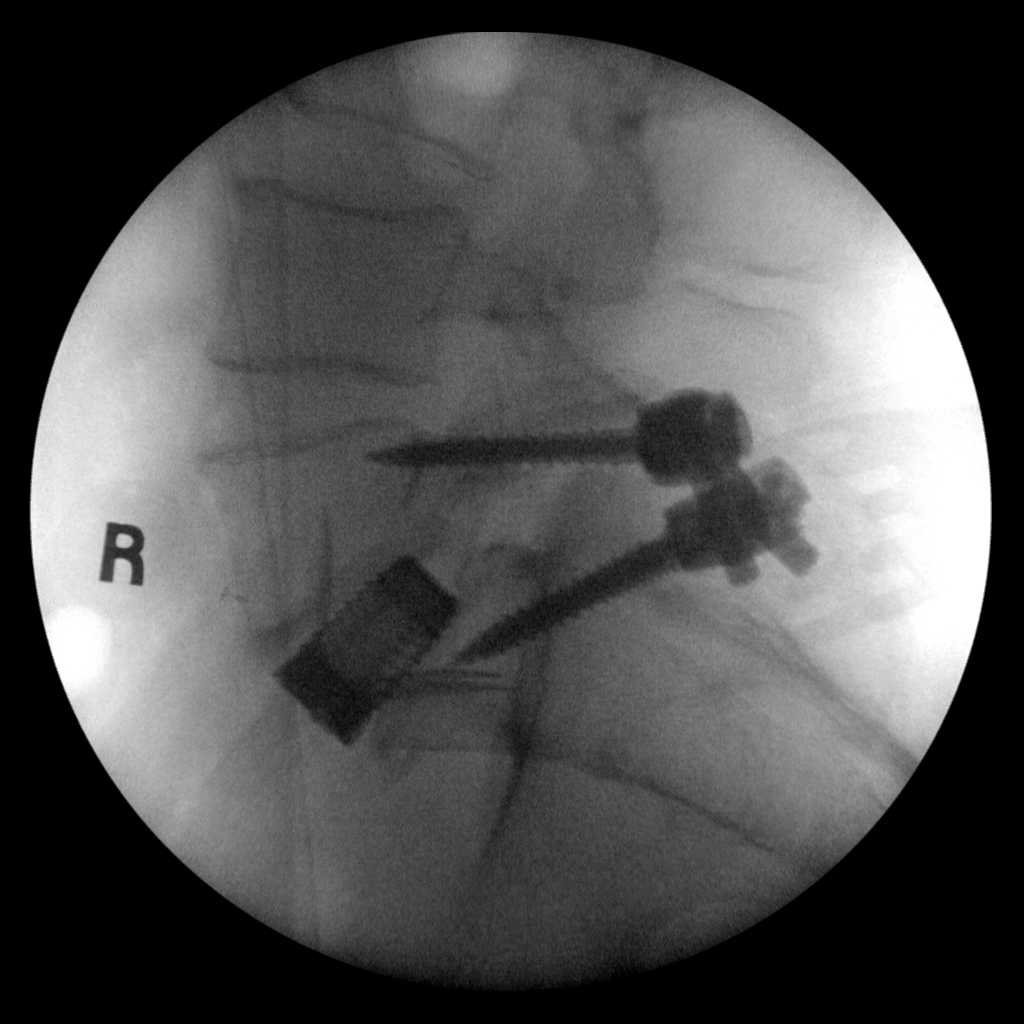
[im 4/5]
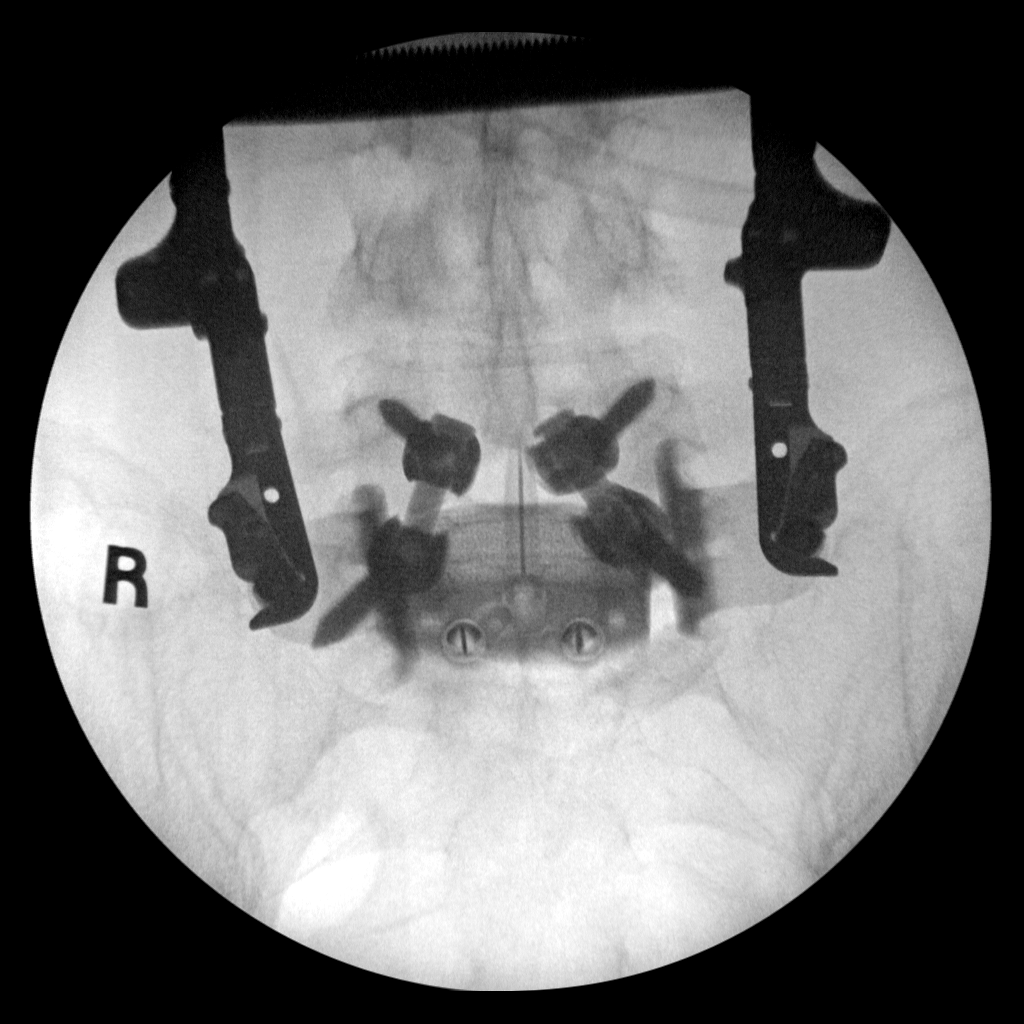
[im 5/5]
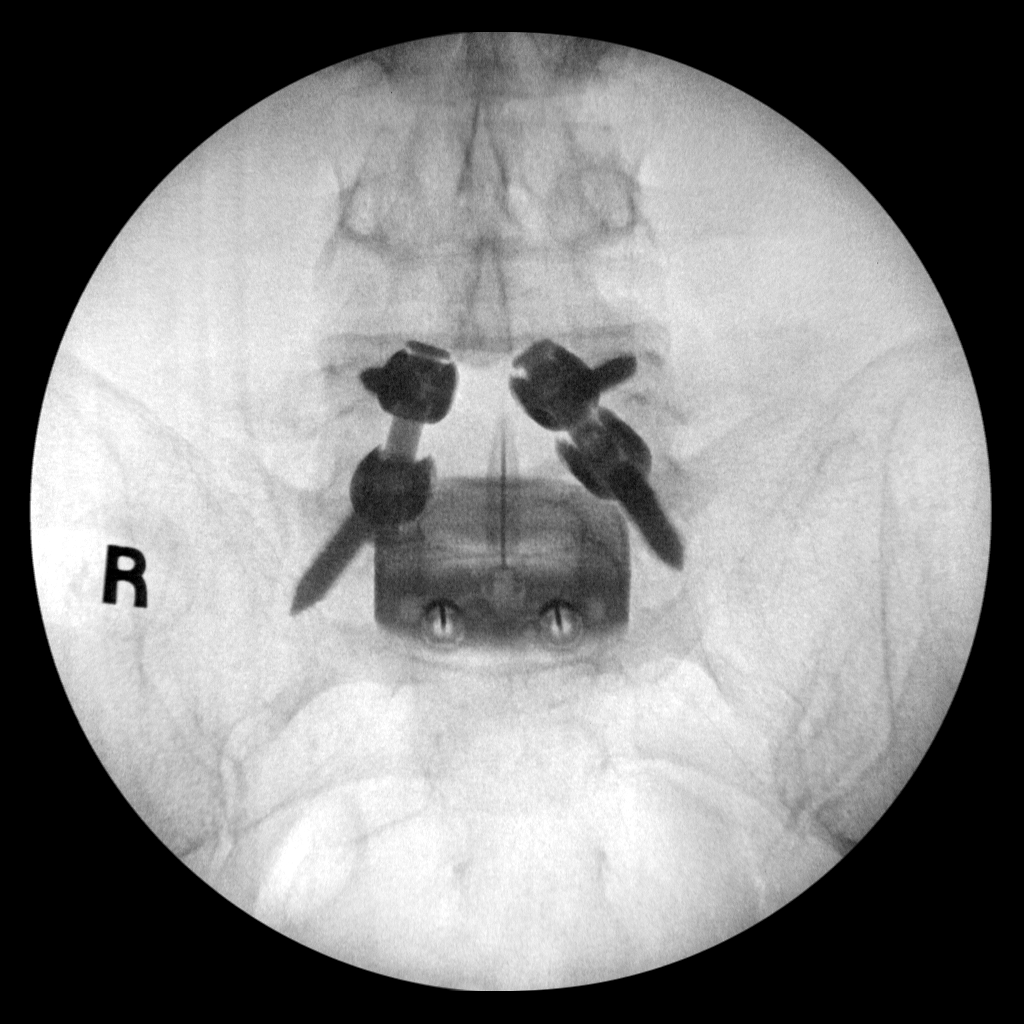

[5 of 5 positions shown; findings below may reference images not displayed]

FINDINGS: PA and lateral view intraoperative fluoroscopic images of the
lumbosacral spine are submitted, 5 images total. The initial images
demonstrate anterior lumbar interbody fusion hardware at L5-S1 with
overlying retractors. On the later images, a posterior spinal fusion
construct is present at L5-S1 (bilateral pedicle screws and vertical
interconnecting rods).
IMPRESSION: Five intraoperative fluoroscopic images of the lumbosacral spine, as
described.

## 2022-02-04 IMAGING — CR DG OR LOCAL ABDOMEN
1 series · 1 of 1 positions shown · non-contrast
Comparison: None.

CLINICAL DATA: Status post L5-S1 ALIF, routine search for
instruments

EXAM:
OR LOCAL ABDOMEN

[AP]
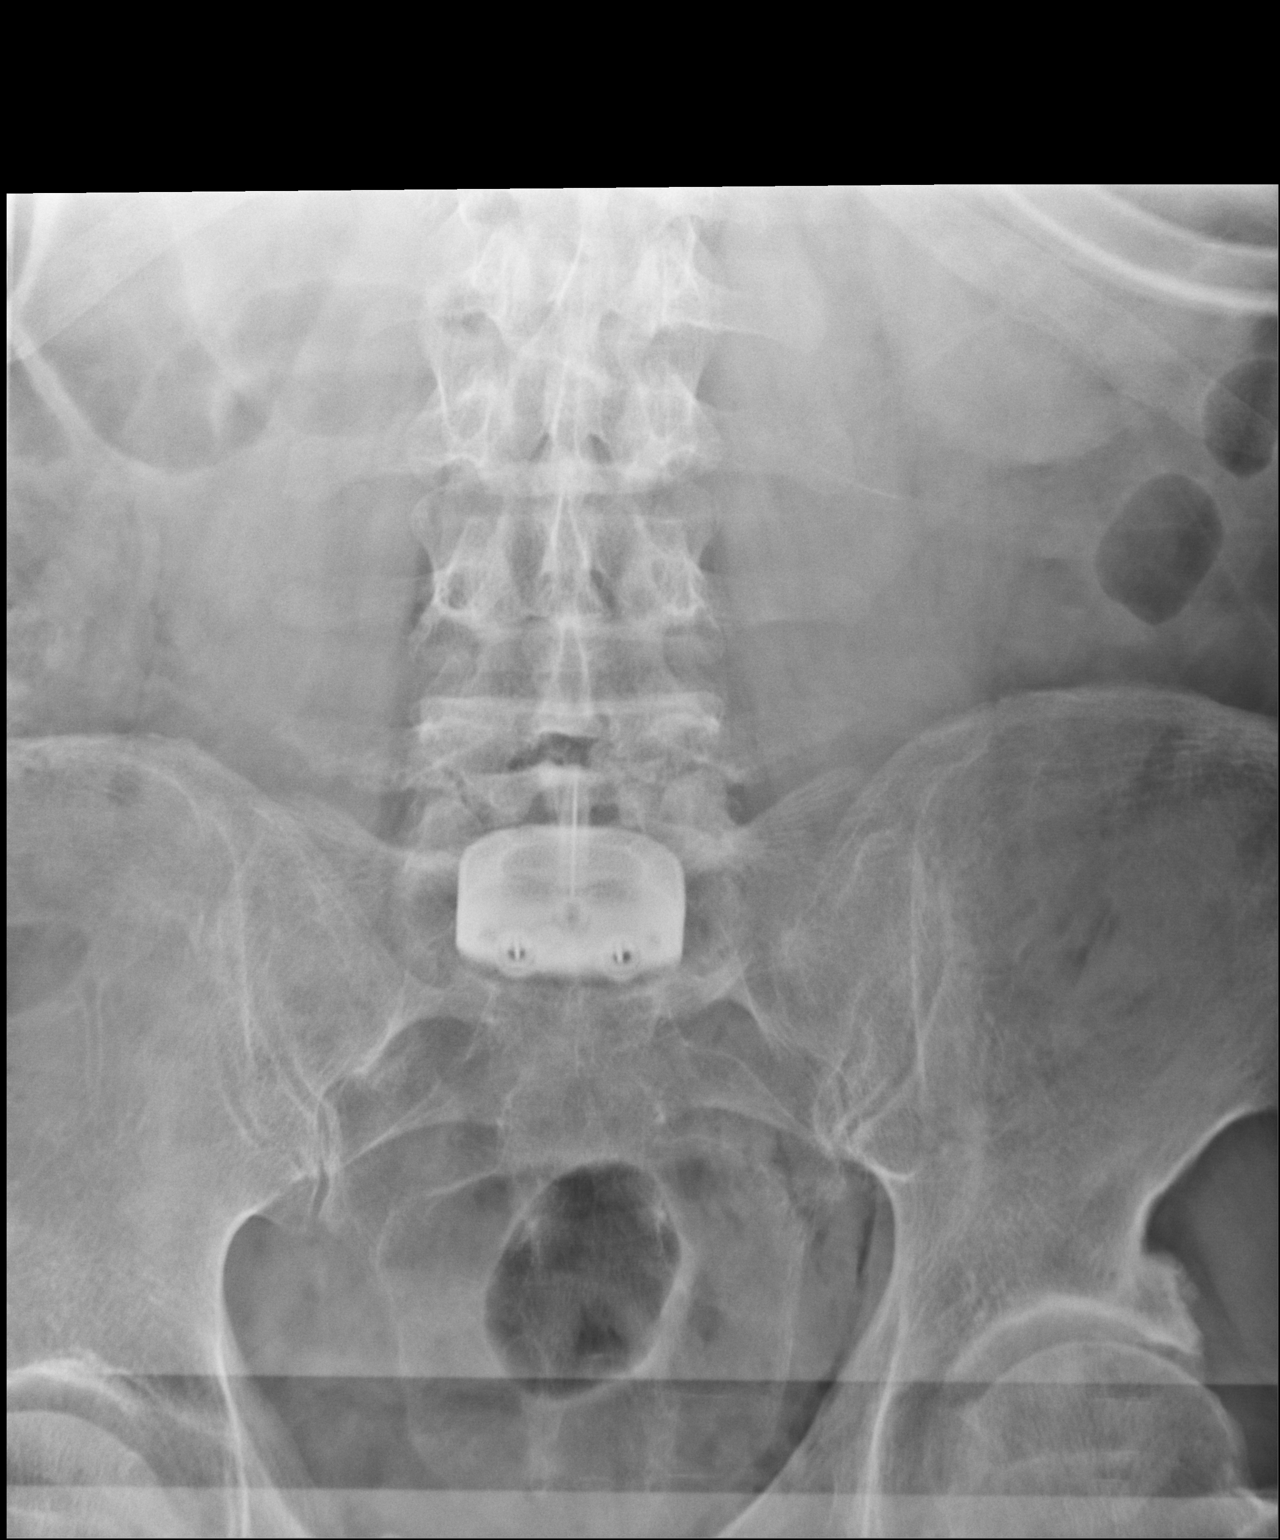

[1 of 1 positions shown; findings below may reference images not displayed]

FINDINGS: Surgical plate with interlocking screws overlies the L5-S1 disc
space. Curvilinear tubing overlies the upper left corner of the
image, presumably external to the patient such as suction tube. No
additional radiopaque foreign bodies. No dilated small bowel loops.
No focal osseous lesions. Degenerative changes in the lumbar spine.
IMPRESSION: Surgical plate with interlocking screws overlies the L5-S1 disc
space. Curvilinear tubing overlies the upper left corner of the
image, presumably external to the patient. No additional radiopaque
foreign bodies.

These results were called by telephone at the time of interpretation
on 09/19/2020 at [DATE] to AMNON TIGER, RN in the OR, who verbally
acknowledged these results.

## 2022-02-19 IMAGING — DX DG LUMBAR SPINE 1V
1 series · 1 of 1 positions shown · non-contrast
Comparison: 09/27/2020, 09/25/2020

CLINICAL DATA: Postoperative pain

EXAM:
LUMBAR SPINE - 1 VIEW

[l-spine lat]
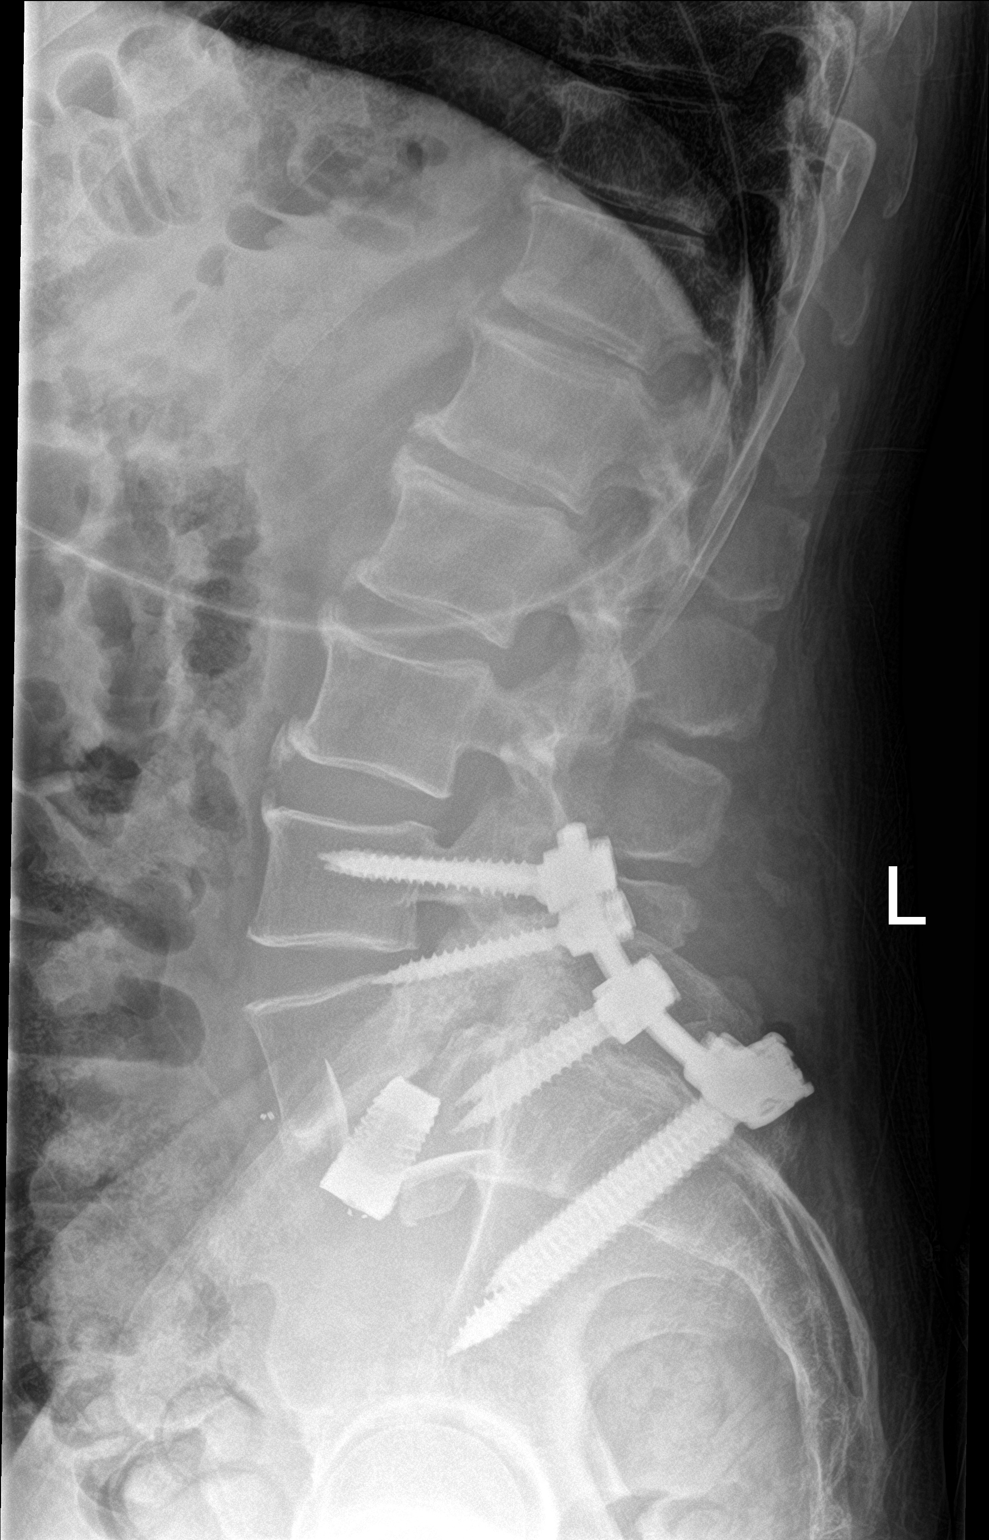

[1 of 1 positions shown; findings below may reference images not displayed]

FINDINGS: No fracture of the lumbar spine. Redemonstrated findings of L5-S1
discectomy with with anterolisthesis of L5 on S1 and L4 through S2
posterior fusion. Findings are not significant changed compared to
intraoperative fluoroscopic images dated 09/27/2020. No evidence of
perihardware fracture.
IMPRESSION: Redemonstrated findings of L5-S1 discectomy with with
anterolisthesis of L5 on S1 and L4 through S2 posterior fusion.
Findings are not significant changed compared to intraoperative
fluoroscopic images dated 09/27/2020. No evidence of perihardware
fracture.

## 2022-06-17 ENCOUNTER — Other Ambulatory Visit (INDEPENDENT_AMBULATORY_CARE_PROVIDER_SITE_OTHER): Payer: Self-pay | Admitting: Nurse Practitioner

## 2022-08-19 ENCOUNTER — Encounter (INDEPENDENT_AMBULATORY_CARE_PROVIDER_SITE_OTHER): Payer: Self-pay

## 2023-11-03 DIAGNOSIS — H18413 Arcus senilis, bilateral: Secondary | ICD-10-CM | POA: Insufficient documentation

## 2023-11-03 DIAGNOSIS — H524 Presbyopia: Secondary | ICD-10-CM | POA: Insufficient documentation

## 2023-11-03 DIAGNOSIS — H02883 Meibomian gland dysfunction of right eye, unspecified eyelid: Secondary | ICD-10-CM | POA: Insufficient documentation

## 2023-11-03 DIAGNOSIS — Z961 Presence of intraocular lens: Secondary | ICD-10-CM | POA: Insufficient documentation

## 2023-11-03 DIAGNOSIS — H11123 Conjunctival concretions, bilateral: Secondary | ICD-10-CM | POA: Insufficient documentation

## 2023-11-03 DIAGNOSIS — Z8669 Personal history of other diseases of the nervous system and sense organs: Secondary | ICD-10-CM | POA: Insufficient documentation

## 2024-01-27 ENCOUNTER — Ambulatory Visit
Admission: EM | Admit: 2024-01-27 | Discharge: 2024-01-27 | Disposition: A | Discharge status: Home / Self Care | Attending: Family Medicine | Admitting: Family Medicine

## 2024-01-27 DIAGNOSIS — K130 Diseases of lips: Secondary | ICD-10-CM

## 2024-01-27 DIAGNOSIS — W57XXXA Bitten or stung by nonvenomous insect and other nonvenomous arthropods, initial encounter: Secondary | ICD-10-CM

## 2024-01-27 DIAGNOSIS — M5412 Radiculopathy, cervical region: Secondary | ICD-10-CM | POA: Insufficient documentation

## 2024-01-27 HISTORY — DX: Drug induced constipation: K59.03

## 2024-01-27 HISTORY — DX: Dysuria: R30.0

## 2024-01-27 HISTORY — DX: Urinary tract infection, site not specified: N39.0

## 2024-01-27 HISTORY — DX: Elevated white blood cell count, unspecified: D72.829

## 2024-01-27 MED ORDER — SULFAMETHOXAZOLE-TRIMETHOPRIM 800-160 MG PO TABS: 1.0000 | ORAL_TABLET | Freq: Two times a day (BID) | ORAL | 0 refills | Status: AC

## 2024-01-27 MED ORDER — PREDNISONE 20 MG PO TABS
40.0000 mg | ORAL_TABLET | Freq: Every day | ORAL | 0 refills | Status: AC
Start: 2024-01-27 — End: 2024-02-01

## 2024-01-27 NOTE — ED Triage Notes (Signed)
"  I had gotten stung/bitten by something on my upper lip on Sunday". Since then progressively more swollen. "I was replacing outdoor lights and hornets next was behind it, so some came down and probably had stung me". No respiratory distress.

## 2024-01-27 NOTE — ED Provider Notes (Signed)
 [facility]    CSN: [id number] Arrival date & time: 01/27/24  0847      History   Chief Complaint Chief Complaint  Patient presents with   Insect Bite    HPI Alexander Rojas is a 68 y.o. male.   HPI Here for swelling and pain of his lip, right part. On 4/6, he ws working outdoors at his house, when a hornets nest fell and hit him in the lower face. Not really stung that he could tell. Soon after noted some odd sensation there, maybe tingling.  Then yesterday it began swelling and felt worse. Now hurting, and swelling more. Took advil  No trouble with lip swelling or throat closing or dyspnea. No f/c. Not really itching.  NKDA except fosamax.  No DM Past Medical History:  Diagnosis Date   Acute lower UTI    Back pain    BPH (benign prostatic hyperplasia)    Drug induced constipation    DVT (deep venous thrombosis) (HCC)    Dysuria    Extensor tendon laceration, finger, open wound, initial encounter    left thumb   Hypertension    Hypocalcemia 12/08/2020   Hypothyroidism    Leukocytosis    Macrocytosis    Numbness and tingling    Pars defect with spondylolisthesis    Tongue burning sensation 04/18/2016    Patient Active Problem List   Diagnosis Date Noted   Cervical radiculitis 01/27/2024   Cervical radiculopathy 01/27/2024   Hx of episcleritis 11/03/2023   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/2023   Astigmatism of both eyes with presbyopia 11/03/2023   Arcus senilis of both eyes 11/03/2023   Meibomian gland dysfunction (MGD) of both eyes 11/03/2023   Pseudophakia, both eyes 11/03/2023   Age-related osteoporosis without current pathological fracture 01/21/2022   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   Encounter for orthopedic follow-up care 04/04/2021   Pain and swelling of lower leg 04/02/2021   Pain in elbow 02/08/2021   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   History of DVT of lower extremity 01/19/2021   Vitamin D deficiency 01/19/2021   Ulnar  neuropathy at elbow of left upper extremity 01/16/2021   Ulnar nerve entrapment syndrome, left 12/11/2020   Abnormality of gait 12/11/2020   DVT (deep venous thrombosis) (HCC) 12/08/2020   Post-operative pain    History of hypertension    Neuropathic pain    Lumbar radiculopathy 10/10/2020   Hardware failure of anterior column of spine (HCC) 09/26/2020   S/P lumbar fusion 09/19/2020   Benign prostatic hyperplasia with urinary hesitancy 05/09/2017   Essential hypertension 10/06/2014   Hypothyroidism due to acquired atrophy of thyroid 10/06/2014    Past Surgical History:  Procedure Laterality Date   ABDOMINAL EXPOSURE N/A 09/19/2020   Procedure: ABDOMINAL EXPOSURE;  Surgeon: Larina Earthly, MD;  Location[facility];  Service: Vascular;  Laterality: N/A;  anterior   ANTERIOR LUMBAR FUSION N/A 09/19/2020   Procedure: Anterior Lumbar Interbody Fusion Lumbar Five-Sacral One, posterior lateral fusion with gill decompression Lumbar Five-Sacral One.;  Surgeon: Tia Alert, MD;  Location: [place];  Service: Neurosurgery;  Laterality: N/A;  anterior   APPLICATION OF INTRAOPERATIVE CT SCAN N/A 09/27/2020   Procedure: APPLICATION OF INTRAOPERATIVE CT SCAN;  Surgeon: Tia Alert, MD;  Location: [facility];  Service: Neurosurgery;  Laterality: N/A;   COLONOSCOPY WITH PROPOFOL N/A 12/18/2018   Procedure: COLONOSCOPY WITH BIOPSY;  Surgeon: Midge Minium, MD;  Location: [facility];  Service: Endoscopy;  Laterality: N/A;  NEEDS TO STAY FIRST   ESOPHAGOGASTRODUODENOSCOPY     ESOPHAGOGASTRODUODENOSCOPY (EGD) WITH PROPOFOL N/A 02/23/2019   Procedure: ESOPHAGOGASTRODUODENOSCOPY (EGD) WITH PROPOFOL;  Surgeon: Midge Minium, MD;  Location: [facility];  Service: Endoscopy;  Laterality: N/A;   HERNIA REPAIR     LAMINECTOMY WITH POSTERIOR LATERAL ARTHRODESIS LEVEL 1 N/A 09/19/2020   Procedure: LAMINECTOMY WITH POSTERIOR LATERAL ARTHRODESIS LEVEL 1;  Surgeon: Tia Alert, MD;  Location: [facility] OR;  Service:  Neurosurgery;  Laterality: N/A;  posterior   LAMINECTOMY WITH POSTERIOR LATERAL ARTHRODESIS LEVEL 2 N/A 09/27/2020   Procedure: Lumbar exploration with Extension of Posterior Instrumentation Lumbar Four to the [place] with Airo;  Surgeon: Tia Alert, MD;  Location: [facility];  Service: Neurosurgery;  Laterality: N/A;   LIPOMA EXCISION Right    upper arm   PERIPHERAL VASCULAR THROMBECTOMY Left 04/05/2021   Procedure: PERIPHERAL VASCULAR THROMBECTOMY;  Surgeon: Annice Needy, MD;  Location: [facility];  Service: Cardiovascular;  Laterality: Left;   POLYPECTOMY N/A 12/18/2018   Procedure: POLYPECTOMY;  Surgeon: Midge Minium, MD;  Location: [facility];  Service: Endoscopy;  Laterality: N/A;   TENDON REPAIR Left 11/05/2019   Procedure: LEFT THUMB TENDON REPAIR;  Surgeon: Allena Napoleon, MD;  Location: [facility];  Service: Plastics;  Laterality: Left;  Axillary block, MAC   THROMBECTOMY     ULNAR NERVE REPAIR     UMBILICAL HERNIA REPAIR         Home Medications    Prior to Admission medications   Medication Sig Start Date End Date Taking? Authorizing Provider  albuterol (VENTOLIN HFA) 108 (90 Base) MCG/ACT inhaler Inhale 2 puffs into the lungs every 4 (four) hours as needed for wheezing or shortness of breath. 10/27/23  Yes [provider]  azithromycin (ZITHROMAX) 250 MG tablet Take 250 mg by mouth as directed. 10/27/23  Yes [provider]  chlorpheniramine-HYDROcodone (TUSSIONEX) 10-8 MG/5ML Take 5 mLs by mouth every 12 (twelve) hours as needed. 10/27/23  Yes [provider]  Insulin Pen Needle (PEN NEEDLES) 33G X 4 MM MISC See admin instructions. 12/16/22  Yes [provider]  predniSONE (DELTASONE) 20 MG tablet Take 2 tablets (40 mg total) by mouth daily with breakfast for 5 days. 01/27/24 02/01/24 Yes Nakeshia Waldeck, Janace Aris, MD  sulfamethoxazole-trimethoprim (BACTRIM DS) 800-160 MG tablet Take 1 tablet by mouth 2 (two) times daily for  7 days. 01/27/24 02/03/24 Yes Zenia Resides, MD  Abaloparatide (TYMLOS) 3120 MCG/1.56ML SOPN Inject 80 mcg subcutaneously once daily 02/21/21   Quentin Angst, MD  calcium-vitamin D (OSCAL WITH D) 500-200 MG-UNIT tablet Take 1 tablet by mouth daily. 10/26/20   Angiulli, Mcarthur Rossetti, PA-C  hydrochlorothiazide (HYDRODIURIL) 12.5 MG tablet     [provider]  hydrochlorothiazide (MICROZIDE) 12.5 MG capsule Take 1 capsule by mouth once daily 04/11/21     hydrochlorothiazide (MICROZIDE) 12.5 MG capsule Take 1 capsule (12.5 mg total) by mouth once daily 10/02/21  Yes   Insulin Pen Needle (BD PEN NEEDLE NANO 2ND GEN) 32G X 4 MM MISC use as directed 03/01/21     levothyroxine (SYNTHROID) 100 MCG tablet Take 1 tablet by mouth once daily on an empty stomach with a glass of water at least 30-60 minutes before breakfast. 09/21/21  Yes   losartan (COZAAR) 50 MG tablet TAKE 1 TABLET BY MOUTH ONCE DAILY 06/22/21  Yes   pantoprazole (PROTONIX) 40 MG tablet TAKE 1 TABLET BY MOUTH ONCE DAILY 06/19/21  Yes   QC Alcohol Swabs 70 % PADS Apply 1 each topically once daily 03/01/21     rivaroxaban (XARELTO) 10 MG TABS tablet Take 1 tablet (10 mg total) by mouth daily. 08/28/21   Annice Needy, MD  rivaroxaban (XARELTO) 10 MG TABS tablet Take 1 tablet by mouth daily with supper. 09/21/21  Yes Georgiana Spinner, NP  vitamin A 3 MG (10000 UNITS) capsule Take 10,000 Units by mouth daily.    [provider]  Vitamin D, Ergocalciferol, (DRISDOL) 1.25 MG (50000 UNIT) CAPS capsule Take 1 capsule by mouth once weekly 06/13/21  Yes   XARELTO 10 MG TABS tablet Take 1 tablet (10 mg total) by mouth daily with supper. 06/17/22   Annice Needy, MD    Family History History reviewed. No pertinent family history.  Social History Social History   Tobacco Use   Smoking status: Never   Smokeless tobacco: Never  Vaping Use   Vaping status: Never Used  Substance Use Topics   Alcohol use: Not Currently   Drug use: Never      Allergies   Alendronate sodium and Alendronate   Review of Systems Review of Systems   Physical Exam Triage Vital Signs ED Triage Vitals  Encounter Vitals Group     BP 01/27/24 0952 134/85     Systolic BP Percentile --      Diastolic BP Percentile --      Pulse Rate 01/27/24 0952 71     Resp 01/27/24 0952 18     Temp 01/27/24 0952 98.1 F (36.7 C)     Temp Source 01/27/24 0952 Oral     SpO2 01/27/24 0952 94 %     Weight 01/27/24 0950 195 lb (88.5 kg)     Height 01/27/24 0950 5\' 7"  (1.702 m)     Head Circumference --      Peak Flow --      Pain Score 01/27/24 0947 5     Pain Loc --      Pain Education --      Exclude from Growth Chart --    No data found.  Updated Vital Signs BP 134/85 (BP Location: Left Arm)   Pulse 71   Temp 98.1 F (36.7 C) (Oral)   Resp 18   Ht 5\' 7"  (1.702 m)   Wt 88.5 kg   SpO2 95%   BMI 30.54 kg/m   Visual Acuity Right Eye Distance:   Left Eye Distance:   Bilateral Distance:    Right Eye Near:   Left Eye Near:    Bilateral Near:     Physical Exam Vitals reviewed.  Constitutional:      General: He is not in acute distress.    Appearance: He is not ill-appearing, toxic-appearing or diaphoretic.  HENT:     Mouth/Throat:     Mouth: Mucous membranes are moist.     Comments: His upper lip is swollen and indurated and tender, more toward the right part. There is a possible puncture/bite site on the right vermilion border. No ulceration on mucosal side of lip. OP benign. Skin:    Coloration: Skin is not pale.  Neurological:     General: No focal deficit present.     Mental Status: He is alert and oriented to person, place, and time.  Psychiatric:        Behavior: Behavior normal.      UC Treatments / Results  Labs (all labs ordered are listed, but  only abnormal results are displayed) Labs Reviewed - No data to display  EKG   Radiology No results found.  Procedures Procedures (including critical care  time)  Medications Ordered in UC Medications - No data to display  Initial Impression / Assessment and Plan / UC Course  I have reviewed the triage vital signs and the nursing notes.  Pertinent labs & imaging results that were available during my care of the patient were reviewed by me and considered in my medical decision making (see chart for details).     Bactrim sent to treat poss cellulitis. 5 day course of prednisone sent of poss reaction to insect bite.  He has tramadol at home to take for pain  To the ER if worsening Final Clinical Impressions(s) / UC Diagnoses   Final diagnoses:  Cellulitis of lip  Reaction to insect bite     Discharge Instructions      Take prednisone 20 mg--2 daily for 5 days  Take sulfa meth oxazole-trimethoprim 800-160 mg--1 tablet 2 times daily for 7 days.  Please go to the ER if this is worsening instead of improving.     ED Prescriptions     Medication Sig Dispense Auth. Provider   predniSONE (DELTASONE) 20 MG tablet Take 2 tablets (40 mg total) by mouth daily with breakfast for 5 days. 10 tablet Zenia Resides, MD   sulfamethoxazole-trimethoprim (BACTRIM DS) 800-160 MG tablet Take 1 tablet by mouth 2 (two) times daily for 7 days. 14 tablet Saran Laviolette, Janace Aris, MD      PDMP not reviewed this encounter.   Zenia Resides, MD 01/27/24 1052

## 2024-01-27 NOTE — Discharge Instructions (Signed)
 Take prednisone 20 mg--2 daily for 5 days  Take sulfa meth oxazole-trimethoprim 800-160 mg--1 tablet 2 times daily for 7 days.  Please go to the ER if this is worsening instead of improving.

## 2024-07-24 ENCOUNTER — Encounter: Payer: Self-pay | Admitting: Emergency Medicine

## 2024-07-24 ENCOUNTER — Ambulatory Visit
Admission: EM | Admit: 2024-07-24 | Discharge: 2024-07-24 | Disposition: A | Discharge status: Home / Self Care | Attending: Nurse Practitioner | Admitting: Nurse Practitioner

## 2024-07-24 ENCOUNTER — Ambulatory Visit (INDEPENDENT_AMBULATORY_CARE_PROVIDER_SITE_OTHER)

## 2024-07-24 DIAGNOSIS — J069 Acute upper respiratory infection, unspecified: Secondary | ICD-10-CM | POA: Diagnosis not present

## 2024-07-24 DIAGNOSIS — R051 Acute cough: Secondary | ICD-10-CM

## 2024-07-24 LAB — POC SOFIA SARS ANTIGEN FIA: SARS Coronavirus 2 Ag: NEGATIVE

## 2024-07-24 MED ORDER — MUCINEX DM MAXIMUM STRENGTH 60-1200 MG PO TB12: 1.0000 | ORAL_TABLET | Freq: Two times a day (BID) | ORAL | 0 refills | Status: AC

## 2024-07-24 MED ORDER — METHYLPREDNISOLONE 4 MG PO TBPK: ORAL_TABLET | ORAL | 0 refills | Status: AC

## 2024-07-24 MED ORDER — PSEUDOEPH-BROMPHEN-DM 30-2-10 MG/5ML PO SYRP: 5.0000 mL | ORAL_SOLUTION | Freq: Four times a day (QID) | ORAL | 0 refills | Status: AC | PRN

## 2024-07-24 MED ORDER — AMOXICILLIN-POT CLAVULANATE 875-125 MG PO TABS: 1.0000 | ORAL_TABLET | Freq: Two times a day (BID) | ORAL | 0 refills | Status: AC

## 2024-07-24 NOTE — ED Provider Notes (Signed)
 [facility]    CSN: [id number] Arrival date & time: 07/24/24  0804      History   Chief Complaint Chief Complaint  Patient presents with   URI    HPI Alexander Rojas is a 68 y.o. male.   Discussed the use of AI scribe software for clinical note transcription with the patient, who gave verbal consent to proceed.   The patient presents with a 6-day history of respiratory symptoms that have worsened over the past few days. The illness began with a cough, sore throat, runny nose, and tickle in the throat. Over the last couple of days, the patient developed a fever, with the highest temperature recorded at 101F. The patient reports experiencing chills and  fatigue/  The cough has become persistent, with the patient noting a particularly severe episode last night that was unresponsive to night-time medication. The patient describes thick nasal discharge with significant sinus pressure. The mucus secretions from the cough are described as thin and difficult to expectorate, even with medication. The sore throat has progressively worsened over the past few days. The patient reports visiting their daughter in [place], [place]  last weekend, who was ill at the time. The patient believes this may be the source of their current illness. The patient lives alone and has had limited contact with others.  The patient denies any history of hypertension, diabetes, or cholesterol issues. The patient denies smoking or vaping.  The following sections of the patient's history were reviewed and updated as appropriate: allergies, current medications, past family history, past medical history, past social history, past surgical history, and problem list.       Past Medical History:  Diagnosis Date   Acute lower UTI    Back pain    BPH (benign prostatic hyperplasia)    Drug induced constipation    DVT (deep venous thrombosis) (HCC)    Dysuria    Extensor tendon laceration, finger, open  wound, initial encounter    left thumb   Hypertension    Hypocalcemia 12/08/2020   Hypothyroidism    Leukocytosis    Macrocytosis    Numbness and tingling    Pars defect with spondylolisthesis    Tongue burning sensation 04/18/2016    Patient Active Problem List   Diagnosis Date Noted   Cervical radiculitis 01/27/2024   Cervical radiculopathy 01/27/2024   Hx of episcleritis 11/03/2023   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/2023   Astigmatism of both eyes with presbyopia 11/03/2023   Arcus senilis of both eyes 11/03/2023   Meibomian gland dysfunction (MGD) of both eyes 11/03/2023   Pseudophakia, both eyes 11/03/2023   Age-related osteoporosis without current pathological fracture 01/21/2022   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   Encounter for orthopedic follow-up care 04/04/2021   Pain and swelling of lower leg 04/02/2021   Pain in elbow 02/08/2021   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   History of DVT of lower extremity 01/19/2021   Vitamin D  deficiency 01/19/2021   Ulnar neuropathy at elbow of left upper extremity 01/16/2021   Ulnar nerve entrapment syndrome, left 12/11/2020   Abnormality of gait 12/11/2020   DVT (deep venous thrombosis) (HCC) 12/08/2020   Post-operative pain    History of hypertension    Neuropathic pain    Lumbar radiculopathy 10/10/2020   Hardware failure of anterior column of spine 09/26/2020   S/P lumbar fusion 09/19/2020   Benign prostatic hyperplasia with urinary hesitancy 05/09/2017   Essential hypertension 10/06/2014   Hypothyroidism due to acquired atrophy of  thyroid  10/06/2014    Past Surgical History:  Procedure Laterality Date   ABDOMINAL EXPOSURE N/A 09/19/2020   Procedure: ABDOMINAL EXPOSURE;  Surgeon: Oris Krystal FALCON, MD;  Location: [facility] OR;  Service: Vascular;  Laterality: N/A;  anterior   ANTERIOR LUMBAR FUSION N/A 09/19/2020   Procedure: Anterior Lumbar Interbody Fusion Lumbar Five-Sacral One, posterior lateral fusion with gill  decompression Lumbar Five-Sacral One.;  Surgeon: Joshua Alm RAMAN, MD;  Location: [facility] OR;  Service: Neurosurgery;  Laterality: N/A;  anterior   APPLICATION OF INTRAOPERATIVE CT SCAN N/A 09/27/2020   Procedure: APPLICATION OF INTRAOPERATIVE CT SCAN;  Surgeon: Joshua Alm RAMAN, MD;  Location: [facility] OR;  Service: Neurosurgery;  Laterality: N/A;   COLONOSCOPY WITH PROPOFOL  N/A 12/18/2018   Procedure: COLONOSCOPY WITH BIOPSY;  Surgeon: Jinny Carmine, MD;  Location[facility];  Service: Endoscopy;  Laterality: N/A;  NEEDS TO STAY FIRST   ESOPHAGOGASTRODUODENOSCOPY     ESOPHAGOGASTRODUODENOSCOPY (EGD) WITH PROPOFOL  N/A 02/23/2019   Procedure: ESOPHAGOGASTRODUODENOSCOPY (EGD) WITH PROPOFOL ;  Surgeon: Jinny Carmine, MD;  Location: [facility];  Service: Endoscopy;  Laterality: N/A;   HERNIA REPAIR     LAMINECTOMY WITH POSTERIOR LATERAL ARTHRODESIS LEVEL 1 N/A 09/19/2020   Procedure: LAMINECTOMY WITH POSTERIOR LATERAL ARTHRODESIS LEVEL 1;  Surgeon: Joshua Alm RAMAN, MD;  Location: [facility] OR;  Service: Neurosurgery;  Laterality: N/A;  posterior   LAMINECTOMY WITH POSTERIOR LATERAL ARTHRODESIS LEVEL 2 N/A 09/27/2020   Procedure: Lumbar exploration with Extension of Posterior Instrumentation Lumbar Four to the Ilium with Airo;  Surgeon: Joshua Alm RAMAN, MD;  Location: [facility] OR;  Service: Neurosurgery;  Laterality: N/A;   LIPOMA EXCISION Right    upper arm   PERIPHERAL VASCULAR THROMBECTOMY Left 04/05/2021   Procedure: PERIPHERAL VASCULAR THROMBECTOMY;  Surgeon: Marea Selinda RAMAN, MD;  Location: [facility];  Service: Cardiovascular;  Laterality: Left;   POLYPECTOMY N/A 12/18/2018   Procedure: POLYPECTOMY;  Surgeon: Jinny Carmine, MD;  Location: [facility];  Service: Endoscopy;  Laterality: N/A;   TENDON REPAIR Left 11/05/2019   Procedure: LEFT THUMB TENDON REPAIR;  Surgeon: Elisabeth Craig RAMAN, MD;  Location: [facility];  Service: Plastics;  Laterality: Left;  Axillary block, MAC    THROMBECTOMY     ULNAR NERVE REPAIR     UMBILICAL HERNIA REPAIR         Home Medications    Prior to Admission medications   Medication Sig Start Date End Date Taking? Authorizing Provider  amoxicillin-clavulanate (AUGMENTIN) 875-125 MG tablet Take 1 tablet by mouth 2 (two) times daily after a meal. 07/24/24  Yes Iola Lukes, FNP  brompheniramine-pseudoephedrine-DM 30-2-10 MG/5ML syrup Take 5 mLs by mouth 4 (four) times daily as needed (cough and congestion). 07/24/24  Yes Temiloluwa Laredo, Lukes, FNP  Dextromethorphan-guaiFENesin (MUCINEX DM MAXIMUM STRENGTH) 60-1200 MG TB12 Take 1 tablet by mouth 2 (two) times daily. 07/24/24  Yes Iola Lukes, FNP  methylPREDNISolone  (MEDROL  DOSEPAK) 4 MG TBPK tablet Take as directed 07/24/24  Yes Olympia Adelsberger, Lukes, FNP  Abaloparatide  (TYMLOS ) 3120 MCG/1.56ML SOPN Inject 80 mcg subcutaneously once daily 02/21/21   Jegede, Olugbemiga E, MD  albuterol (VENTOLIN HFA) 108 (90 Base) MCG/ACT inhaler Inhale 2 puffs into the lungs every 4 (four) hours as needed for wheezing or shortness of breath. 10/27/23   [provider]  calcium -vitamin D  (OSCAL WITH D) 500-200 MG-UNIT tablet Take 1 tablet by mouth daily. 10/26/20   Angiulli, Toribio PARAS, PA-C  hydrochlorothiazide  (HYDRODIURIL ) 12.5 MG tablet     [provider]  hydrochlorothiazide  (MICROZIDE ) 12.5 MG capsule Take 1 capsule by mouth once daily 04/11/21     hydrochlorothiazide  (MICROZIDE ) 12.5 MG capsule Take 1 capsule (12.5 mg total) by mouth once daily 10/02/21     Insulin  Pen Needle (BD PEN NEEDLE NANO 2ND GEN) 32G X 4 MM MISC use as directed 03/01/21     Insulin  Pen Needle (PEN NEEDLES) 33G X 4 MM MISC See admin instructions. 12/16/22   [provider]  levothyroxine  (SYNTHROID ) 100 MCG tablet Take 1 tablet by mouth once daily on an empty stomach with a glass of water  at least 30-60 minutes before breakfast. 09/21/21     losartan  (COZAAR ) 50 MG tablet TAKE 1 TABLET BY MOUTH ONCE DAILY  06/22/21     pantoprazole  (PROTONIX ) 40 MG tablet TAKE 1 TABLET BY MOUTH ONCE DAILY 06/19/21     QC Alcohol  Swabs  70 % PADS Apply 1 each topically once daily 03/01/21     rivaroxaban  (XARELTO ) 10 MG TABS tablet Take 1 tablet (10 mg total) by mouth daily. 08/28/21   Marea Selinda RAMAN, MD  rivaroxaban  (XARELTO ) 10 MG TABS tablet Take 1 tablet by mouth daily with supper. 09/21/21   Brown, Fallon E, NP  vitamin A  3 MG (10000 UNITS) capsule Take 10,000 Units by mouth daily.    [provider]  Vitamin D , Ergocalciferol , (DRISDOL ) 1.25 MG (50000 UNIT) CAPS capsule Take 1 capsule by mouth once weekly 06/13/21     XARELTO  10 MG TABS tablet Take 1 tablet (10 mg total) by mouth daily with supper. 06/17/22   Marea Selinda RAMAN, MD    Family History History reviewed. No pertinent family history.  Social History Social History   Tobacco Use   Smoking status: Never    Passive exposure: Never   Smokeless tobacco: Never  Vaping Use   Vaping status: Never Used  Substance Use Topics   Alcohol  use: Not Currently   Drug use: Never     Allergies   Alendronate  sodium and Alendronate    Review of Systems Review of Systems  Constitutional:  Positive for fatigue and fever.  HENT:  Positive for congestion, rhinorrhea, sinus pressure and sore throat.   Respiratory:  Positive for cough and shortness of breath. Negative for wheezing.   Gastrointestinal:  Negative for diarrhea and vomiting.  Musculoskeletal:  Positive for myalgias.  Neurological:  Positive for headaches.  All other systems reviewed and are negative.    Physical Exam Triage Vital Signs ED Triage Vitals  Encounter Vitals Group     BP 07/24/24 0825 (!) 149/85     Girls Systolic BP Percentile --      Girls Diastolic BP Percentile --      Boys Systolic BP Percentile --      Boys Diastolic BP Percentile --      Pulse Rate 07/24/24 0825 67     Resp 07/24/24 0825 18     Temp 07/24/24 0825 98 F (36.7 C)     Temp Source 07/24/24 0825 Oral      SpO2 07/24/24 0825 95 %     Weight 07/24/24 0833 195 lb 1.7 oz (88.5 kg)     Height --      Head Circumference --      Peak Flow --      Pain Score 07/24/24 0832 0     Pain Loc --      Pain Education --      Exclude from Growth Chart --  No data found.  Updated Vital Signs BP (!) 149/85 (BP Location: Left Arm)   Pulse 63   Temp 98 F (36.7 C) (Oral)   Resp 18   Wt 195 lb 1.7 oz (88.5 kg)   SpO2 96%   BMI 30.56 kg/m   Visual Acuity Right Eye Distance:   Left Eye Distance:   Bilateral Distance:    Right Eye Near:   Left Eye Near:    Bilateral Near:     Physical Exam Vitals reviewed.  Constitutional:      General: He is awake. He is not in acute distress.    Appearance: Normal appearance. He is well-developed. He is not ill-appearing, toxic-appearing or diaphoretic.  HENT:     Head: Normocephalic.     Right Ear: Tympanic membrane, ear canal and external ear normal. No drainage, swelling or tenderness. No middle ear effusion. Tympanic membrane is not erythematous.     Left Ear: Tympanic membrane, ear canal and external ear normal. No drainage, swelling or tenderness.  No middle ear effusion. Tympanic membrane is not erythematous.     Nose: Congestion present. No rhinorrhea.     Right Sinus: Maxillary sinus tenderness present.     Left Sinus: Maxillary sinus tenderness present.     Mouth/Throat:     Lips: Pink.     Mouth: Mucous membranes are moist.     Pharynx: No pharyngeal swelling, oropharyngeal exudate, posterior oropharyngeal erythema or uvula swelling.     Tonsils: No tonsillar exudate or tonsillar abscesses.  Eyes:     General: Vision grossly intact.     Conjunctiva/sclera: Conjunctivae normal.  Cardiovascular:     Rate and Rhythm: Normal rate.     Heart sounds: Normal heart sounds.  Pulmonary:     Effort: Pulmonary effort is normal. No tachypnea or respiratory distress.     Breath sounds: Normal breath sounds and air entry.  Musculoskeletal:         General: Normal range of motion.     Cervical back: Normal range of motion and neck supple.  Lymphadenopathy:     Cervical: No cervical adenopathy.  Skin:    General: Skin is warm and dry.  Neurological:     General: No focal deficit present.     Mental Status: He is alert and oriented to person, place, and time.  Psychiatric:        Behavior: Behavior is cooperative.      UC Treatments / Results  Labs (all labs ordered are listed, but only abnormal results are displayed) Labs Reviewed  POC SOFIA SARS ANTIGEN FIA    EKG   Radiology DG Chest 2 View Result Date: 07/24/2024 CLINICAL DATA:  Cough with fever and shortness of breath. EXAM: CHEST - 2 VIEW COMPARISON:  09/15/2020 FINDINGS: Basilar atelectasis or scarring. No focal consolidation or pulmonary edema. No substantial pleural effusion. The cardio pericardial silhouette is enlarged. No acute bony abnormality. IMPRESSION: Basilar atelectasis or scarring. No acute cardiopulmonary findings. Electronically Signed   By: Camellia Candle M.D.   On: 07/24/2024 09:15    Procedures Procedures (including critical care time)  Medications Ordered in UC Medications - No data to display  Initial Impression / Assessment and Plan / UC Course  I have reviewed the triage vital signs and the nursing notes.  Pertinent labs & imaging results that were available during my care of the patient were reviewed by me and considered in my medical decision making (see chart for details).  The patient presents with symptoms consistent with an upper respiratory infection. COVID test is negative. CXR unremarkable. Exam is reassuring. Supportive care is recommended. Patient was advised to follow up with primary care if symptoms do not improve within one week or if new concerns arise. Instructions were given to seek emergency care if symptoms worsen, including shortness of breath, chest pain, persistent high fever, inability to tolerate fluids, or  confusion.  Today's evaluation has revealed no signs of a dangerous process. Discussed diagnosis with patient and/or guardian. Patient and/or guardian aware of their diagnosis, possible red flag symptoms to watch out for and need for close follow up. Patient and/or guardian understands verbal and written discharge instructions. Patient and/or guardian comfortable with plan and disposition.  Patient and/or guardian has a clear mental status at this time, good insight into illness (after discussion and teaching) and has clear judgment to make decisions regarding their care  Documentation was completed with the aid of voice recognition software. Transcription may contain typographical errors.     Final Clinical Impressions(s) / UC Diagnoses   Final diagnoses:  Acute cough  Upper respiratory tract infection, unspecified type     Discharge Instructions      Your COVID test was negative. Your chest x-ray did not show any pneumonia or other abnormalities. Your symptoms are most likely caused by a respiratory infection, which affects areas like your nose, throat, or lungs. This type of infection is usually caused by a virus, but it's also possible that it could be due to a bacterial infection. Take the medications that were prescribed to you as directed. If you have a fever, headache, or body aches, you can also take Tylenol  to help you feel more comfortable. Be sure to drink plenty of fluids to stay hydrated--aim for enough to keep your urine a pale yellow color. This will also help to thin mucus and make it easier to clear from your body.  Using a cool mist humidifier at home to keep humidity levels above 50% can be helpful. You can also inhale steam for 10 to 15 minutes, 3 to 4 times a day. This can be done by sitting in the bathroom with a hot shower running, or by using over-the-counter vapor shower tablets to help with nasal congestion. Try to avoid cool or dry air as much as possible. When you  sleep, keep your head elevated to help reduce post-nasal drainage. Be sure to get enough rest every night to support your recovery. Don't forget to replace your toothbrush once you start feeling better.  It's normal for a cough to linger for several weeks after a respiratory illness, even after other symptoms have resolved. This happens because the airways remain irritated and take time to fully heal. As long as the cough gradually improves and there are no new concerning symptoms, this is part of the normal recovery process. If your symptoms get worse or if you develop any new or concerning symptoms, go to the emergency room right away.            ED Prescriptions     Medication Sig Dispense Auth. Provider   amoxicillin-clavulanate (AUGMENTIN) 875-125 MG tablet Take 1 tablet by mouth 2 (two) times daily after a meal. 14 tablet Erastus Bartolomei, [place], FNP   methylPREDNISolone  (MEDROL  DOSEPAK) 4 MG TBPK tablet Take as directed 21 tablet Elward Nocera, FNP   Dextromethorphan-guaiFENesin (MUCINEX DM MAXIMUM STRENGTH) 60-1200 MG TB12 Take 1 tablet by mouth 2 (two) times daily. 20 tablet Punam Broussard,  Lucie, FNP   brompheniramine-pseudoephedrine-DM 30-2-10 MG/5ML syrup Take 5 mLs by mouth 4 (four) times daily as needed (cough and congestion). 120 mL Iola Lucie, FNP      PDMP not reviewed this encounter.   Iola [place], [place] 07/24/24 [phone]

## 2024-07-24 NOTE — ED Triage Notes (Signed)
 Pt presents c/o URI x 6 days. Pt reports fever, productive cough, sore throat, runny nose, and facial pressure. Pt has tried NyQuil and DayQuil OTC products thus far but sxs did not improve. Pt denies emesis and diarrhea.

## 2024-07-24 NOTE — Discharge Instructions (Addendum)
 Your COVID test was negative. Your chest x-ray did not show any pneumonia or other abnormalities. Your symptoms are most likely caused by a respiratory infection, which affects areas like your nose, throat, or lungs. This type of infection is usually caused by a virus, but it's also possible that it could be due to a bacterial infection. Take the medications that were prescribed to you as directed. If you have a fever, headache, or body aches, you can also take Tylenol  to help you feel more comfortable. Be sure to drink plenty of fluids to stay hydrated--aim for enough to keep your urine a pale yellow color. This will also help to thin mucus and make it easier to clear from your body.  Using a cool mist humidifier at home to keep humidity levels above 50% can be helpful. You can also inhale steam for 10 to 15 minutes, 3 to 4 times a day. This can be done by sitting in the bathroom with a hot shower running, or by using over-the-counter vapor shower tablets to help with nasal congestion. Try to avoid cool or dry air as much as possible. When you sleep, keep your head elevated to help reduce post-nasal drainage. Be sure to get enough rest every night to support your recovery. Don't forget to replace your toothbrush once you start feeling better.  It's normal for a cough to linger for several weeks after a respiratory illness, even after other symptoms have resolved. This happens because the airways remain irritated and take time to fully heal. As long as the cough gradually improves and there are no new concerning symptoms, this is part of the normal recovery process. If your symptoms get worse or if you develop any new or concerning symptoms, go to the emergency room right away.
